# Patient Record
Sex: Female | Born: 1935 | Race: White | Hispanic: No | Smoking: Former smoker
Health system: Southern US, Community
[De-identification: ages and names within clinical notes are randomized; demographics above are authoritative.]

## PROBLEM LIST (undated history)

## (undated) DIAGNOSIS — J449 Chronic obstructive pulmonary disease, unspecified: Secondary | ICD-10-CM

## (undated) DIAGNOSIS — M8580 Other specified disorders of bone density and structure, unspecified site: Secondary | ICD-10-CM

## (undated) DIAGNOSIS — E079 Disorder of thyroid, unspecified: Secondary | ICD-10-CM

## (undated) DIAGNOSIS — K5790 Diverticulosis of intestine, part unspecified, without perforation or abscess without bleeding: Secondary | ICD-10-CM

## (undated) DIAGNOSIS — C50919 Malignant neoplasm of unspecified site of unspecified female breast: Secondary | ICD-10-CM

## (undated) DIAGNOSIS — D126 Benign neoplasm of colon, unspecified: Secondary | ICD-10-CM

## (undated) DIAGNOSIS — K648 Other hemorrhoids: Secondary | ICD-10-CM

## (undated) DIAGNOSIS — E785 Hyperlipidemia, unspecified: Secondary | ICD-10-CM

## (undated) HISTORY — DX: Disorder of thyroid, unspecified: E07.9

## (undated) HISTORY — DX: Diverticulosis of intestine, part unspecified, without perforation or abscess without bleeding: K57.90

## (undated) HISTORY — DX: Hyperlipidemia, unspecified: E78.5

## (undated) HISTORY — DX: Chronic obstructive pulmonary disease, unspecified: J44.9

## (undated) HISTORY — PX: BREAST SURGERY: SHX581

## (undated) HISTORY — PX: OTHER SURGICAL HISTORY: SHX169

## (undated) HISTORY — PX: BREAST LUMPECTOMY: SHX2

## (undated) HISTORY — DX: Other specified disorders of bone density and structure, unspecified site: M85.80

## (undated) HISTORY — DX: Other hemorrhoids: K64.8

## (undated) HISTORY — DX: Benign neoplasm of colon, unspecified: D12.6

---

## 2001-03-02 ENCOUNTER — Other Ambulatory Visit: Admission: RE | Admit: 2001-03-02 | Discharge: 2001-03-02 | Payer: Self-pay | Admitting: Family Medicine

## 2002-02-07 ENCOUNTER — Encounter: Payer: Self-pay | Admitting: Emergency Medicine

## 2002-02-07 ENCOUNTER — Emergency Department (HOSPITAL_COMMUNITY): Admission: EM | Admit: 2002-02-07 | Discharge: 2002-02-08 | Payer: Self-pay | Admitting: Emergency Medicine

## 2003-02-21 ENCOUNTER — Other Ambulatory Visit: Admission: RE | Admit: 2003-02-21 | Discharge: 2003-02-21 | Payer: Self-pay | Admitting: Family Medicine

## 2004-03-10 ENCOUNTER — Ambulatory Visit: Payer: Self-pay | Admitting: Internal Medicine

## 2004-03-19 ENCOUNTER — Ambulatory Visit: Payer: Self-pay | Admitting: Family Medicine

## 2004-03-25 ENCOUNTER — Ambulatory Visit: Payer: Self-pay | Admitting: Internal Medicine

## 2005-02-25 ENCOUNTER — Ambulatory Visit: Payer: Self-pay | Admitting: Family Medicine

## 2005-03-28 ENCOUNTER — Ambulatory Visit: Payer: Self-pay | Admitting: Family Medicine

## 2005-04-20 ENCOUNTER — Ambulatory Visit: Payer: Self-pay | Admitting: Family Medicine

## 2005-06-02 ENCOUNTER — Ambulatory Visit: Payer: Self-pay | Admitting: Family Medicine

## 2005-07-06 ENCOUNTER — Ambulatory Visit: Payer: Self-pay | Admitting: Family Medicine

## 2005-07-22 ENCOUNTER — Ambulatory Visit: Payer: Self-pay | Admitting: Family Medicine

## 2005-11-10 ENCOUNTER — Ambulatory Visit: Payer: Self-pay | Admitting: Family Medicine

## 2006-02-22 ENCOUNTER — Ambulatory Visit: Payer: Self-pay | Admitting: Family Medicine

## 2006-02-27 ENCOUNTER — Ambulatory Visit: Payer: Self-pay | Admitting: Family Medicine

## 2006-02-27 LAB — CONVERTED CEMR LAB
ALT: 16 units/L (ref 0–40)
AST: 19 units/L (ref 0–37)
BUN: 9 mg/dL (ref 6–23)
Calcium: 9.1 mg/dL (ref 8.4–10.5)
Chloride: 106 meq/L (ref 96–112)
Cholesterol: 144 mg/dL (ref 0–200)
GFR calc non Af Amer: 75 mL/min
Glomerular Filtration Rate, Af Am: 91 mL/min/{1.73_m2}
HDL: 35.2 mg/dL — ABNORMAL LOW (ref >39.0)
Potassium: 4 meq/L (ref 3.5–5.1)
Sodium: 144 meq/L (ref 135–145)

## 2006-05-15 ENCOUNTER — Encounter: Payer: Self-pay | Admitting: Family Medicine

## 2006-05-15 ENCOUNTER — Ambulatory Visit: Payer: Self-pay | Admitting: Family Medicine

## 2006-05-15 ENCOUNTER — Other Ambulatory Visit: Admission: RE | Admit: 2006-05-15 | Discharge: 2006-05-15 | Payer: Self-pay | Admitting: Family Medicine

## 2006-05-15 LAB — CONVERTED CEMR LAB
Albumin: 3.6 g/dL (ref 3.5–5.2)
BUN: 10 mg/dL (ref 6–23)
Basophils Absolute: 0 10*3/uL (ref 0.0–0.1)
CO2: 31 meq/L (ref 19–32)
Creatinine, Ser: 0.8 mg/dL (ref 0.4–1.2)
Glucose, Bld: 117 mg/dL — ABNORMAL HIGH (ref 70–99)
Hemoglobin: 15.4 g/dL — ABNORMAL HIGH (ref 12.0–15.0)
Lymphocytes Relative: 20.9 % (ref 12.0–46.0)
MCHC: 33.7 g/dL (ref 30.0–36.0)
Monocytes Relative: 6.4 % (ref 3.0–11.0)
Neutro Abs: 5 10*3/uL (ref 1.4–7.7)
Neutrophils Relative %: 71.1 % (ref 43.0–77.0)
RDW: 12.4 % (ref 11.5–14.6)
TSH: 1.54 microintl units/mL (ref 0.35–5.50)
Total Bilirubin: 0.7 mg/dL (ref 0.3–1.2)

## 2006-05-30 ENCOUNTER — Ambulatory Visit: Payer: Self-pay | Admitting: Internal Medicine

## 2006-06-30 ENCOUNTER — Encounter: Admission: RE | Admit: 2006-06-30 | Discharge: 2006-06-30 | Payer: Self-pay | Admitting: General Surgery

## 2006-07-31 ENCOUNTER — Encounter: Admission: RE | Admit: 2006-07-31 | Discharge: 2006-07-31 | Payer: Self-pay | Admitting: General Surgery

## 2006-08-02 ENCOUNTER — Ambulatory Visit (HOSPITAL_BASED_OUTPATIENT_CLINIC_OR_DEPARTMENT_OTHER): Admission: RE | Admit: 2006-08-02 | Discharge: 2006-08-02 | Payer: Self-pay | Admitting: General Surgery

## 2006-08-02 ENCOUNTER — Encounter: Admission: RE | Admit: 2006-08-02 | Discharge: 2006-08-02 | Payer: Self-pay | Admitting: General Surgery

## 2006-08-02 ENCOUNTER — Encounter (INDEPENDENT_AMBULATORY_CARE_PROVIDER_SITE_OTHER): Payer: Self-pay | Admitting: General Surgery

## 2006-08-02 ENCOUNTER — Encounter (INDEPENDENT_AMBULATORY_CARE_PROVIDER_SITE_OTHER): Payer: Self-pay | Admitting: *Deleted

## 2006-08-14 ENCOUNTER — Ambulatory Visit: Payer: Self-pay | Admitting: Oncology

## 2006-08-14 ENCOUNTER — Ambulatory Visit: Payer: Self-pay | Admitting: Family Medicine

## 2006-08-14 LAB — CONVERTED CEMR LAB
CO2: 32 meq/L (ref 19–32)
Creatinine, Ser: 0.8 mg/dL (ref 0.4–1.2)
Potassium: 3.8 meq/L (ref 3.5–5.1)
Sodium: 142 meq/L (ref 135–145)
Total Bilirubin: 0.6 mg/dL (ref 0.3–1.2)
Total Protein: 6.6 g/dL (ref 6.0–8.3)

## 2006-08-18 ENCOUNTER — Ambulatory Visit: Admission: RE | Admit: 2006-08-18 | Discharge: 2006-10-28 | Payer: Self-pay | Admitting: *Deleted

## 2006-08-29 LAB — CBC WITH DIFFERENTIAL/PLATELET
BASO%: 1.1 % (ref 0.0–2.0)
LYMPH%: 27.8 % (ref 14.0–48.0)
MCHC: 35.3 g/dL (ref 32.0–36.0)
MCV: 84.6 fL (ref 81.0–101.0)
MONO%: 8.4 % (ref 0.0–13.0)
Platelets: 243 10*3/uL (ref 145–400)
RBC: 4.81 10*6/uL (ref 3.70–5.32)
RDW: 13.1 % (ref 11.3–14.5)
WBC: 7.7 10*3/uL (ref 3.9–10.0)

## 2006-08-29 LAB — COMPREHENSIVE METABOLIC PANEL
ALT: 15 U/L (ref 0–35)
AST: 15 U/L (ref 0–37)
Alkaline Phosphatase: 72 U/L (ref 39–117)
Sodium: 143 mEq/L (ref 135–145)
Total Bilirubin: 0.3 mg/dL (ref 0.3–1.2)
Total Protein: 6.8 g/dL (ref 6.0–8.3)

## 2006-10-11 ENCOUNTER — Encounter: Payer: Self-pay | Admitting: Family Medicine

## 2006-10-19 ENCOUNTER — Encounter: Payer: Self-pay | Admitting: Family Medicine

## 2006-10-26 ENCOUNTER — Ambulatory Visit: Payer: Self-pay | Admitting: Oncology

## 2006-10-28 ENCOUNTER — Encounter: Payer: Self-pay | Admitting: Family Medicine

## 2006-12-01 ENCOUNTER — Ambulatory Visit: Payer: Self-pay | Admitting: Family Medicine

## 2006-12-04 ENCOUNTER — Encounter: Payer: Self-pay | Admitting: Family Medicine

## 2006-12-21 ENCOUNTER — Ambulatory Visit: Payer: Self-pay | Admitting: Oncology

## 2006-12-25 ENCOUNTER — Encounter: Payer: Self-pay | Admitting: Family Medicine

## 2006-12-25 LAB — CBC WITH DIFFERENTIAL/PLATELET
BASO%: 0.2 % (ref 0.0–2.0)
HCT: 37.5 % (ref 34.8–46.6)
LYMPH%: 17.8 % (ref 14.0–48.0)
MCH: 30.2 pg (ref 26.0–34.0)
MCHC: 35.6 g/dL (ref 32.0–36.0)
MCV: 84.9 fL (ref 81.0–101.0)
MONO#: 0.7 10*3/uL (ref 0.1–0.9)
NEUT%: 70.1 % (ref 39.6–76.8)
Platelets: 238 10*3/uL (ref 145–400)
WBC: 6.4 10*3/uL (ref 3.9–10.0)

## 2006-12-25 LAB — COMPREHENSIVE METABOLIC PANEL
ALT: 29 U/L (ref 0–35)
Albumin: 4.3 g/dL (ref 3.5–5.2)
BUN: 15 mg/dL (ref 6–23)
CO2: 25 mEq/L (ref 19–32)
Calcium: 9.9 mg/dL (ref 8.4–10.5)
Chloride: 105 mEq/L (ref 96–112)
Creatinine, Ser: 0.82 mg/dL (ref 0.40–1.20)
Potassium: 4.2 mEq/L (ref 3.5–5.3)

## 2006-12-25 LAB — LACTATE DEHYDROGENASE: LDH: 185 U/L (ref 94–250)

## 2007-01-09 ENCOUNTER — Ambulatory Visit: Payer: Self-pay | Admitting: Family Medicine

## 2007-01-09 ENCOUNTER — Telehealth (INDEPENDENT_AMBULATORY_CARE_PROVIDER_SITE_OTHER): Payer: Self-pay | Admitting: *Deleted

## 2007-01-09 DIAGNOSIS — Z87440 Personal history of urinary (tract) infections: Secondary | ICD-10-CM | POA: Insufficient documentation

## 2007-01-09 LAB — CONVERTED CEMR LAB
Bilirubin Urine: NEGATIVE
Ketones, urine, test strip: NEGATIVE
Protein, U semiquant: NEGATIVE
Urobilinogen, UA: NEGATIVE
pH: 6

## 2007-01-12 ENCOUNTER — Encounter (INDEPENDENT_AMBULATORY_CARE_PROVIDER_SITE_OTHER): Payer: Self-pay | Admitting: *Deleted

## 2007-01-31 ENCOUNTER — Encounter: Payer: Self-pay | Admitting: Family Medicine

## 2007-02-19 ENCOUNTER — Ambulatory Visit: Payer: Self-pay | Admitting: Oncology

## 2007-02-21 ENCOUNTER — Encounter: Payer: Self-pay | Admitting: Family Medicine

## 2007-02-21 LAB — COMPREHENSIVE METABOLIC PANEL
ALT: 16 U/L (ref 0–35)
AST: 17 U/L (ref 0–37)
Albumin: 4.1 g/dL (ref 3.5–5.2)
Alkaline Phosphatase: 52 U/L (ref 39–117)
BUN: 12 mg/dL (ref 6–23)
Calcium: 9.6 mg/dL (ref 8.4–10.5)
Chloride: 104 mEq/L (ref 96–112)
Potassium: 3.9 mEq/L (ref 3.5–5.3)
Sodium: 142 mEq/L (ref 135–145)
Total Protein: 6.7 g/dL (ref 6.0–8.3)

## 2007-02-21 LAB — CBC WITH DIFFERENTIAL/PLATELET
BASO%: 0.2 % (ref 0.0–2.0)
Basophils Absolute: 0 10*3/uL (ref 0.0–0.1)
EOS%: 1.6 % (ref 0.0–7.0)
HGB: 13.3 g/dL (ref 11.6–15.9)
MCH: 30.1 pg (ref 26.0–34.0)
MCV: 85.2 fL (ref 81.0–101.0)
MONO%: 8.6 % (ref 0.0–13.0)
NEUT#: 4.3 10*3/uL (ref 1.5–6.5)
RBC: 4.43 10*6/uL (ref 3.70–5.32)
RDW: 12.6 % (ref 11.3–14.5)
lymph#: 1.4 10*3/uL (ref 0.9–3.3)

## 2007-03-12 ENCOUNTER — Telehealth (INDEPENDENT_AMBULATORY_CARE_PROVIDER_SITE_OTHER): Payer: Self-pay | Admitting: *Deleted

## 2007-03-13 ENCOUNTER — Ambulatory Visit: Payer: Self-pay | Admitting: Family Medicine

## 2007-03-13 DIAGNOSIS — J069 Acute upper respiratory infection, unspecified: Secondary | ICD-10-CM | POA: Insufficient documentation

## 2007-03-16 ENCOUNTER — Ambulatory Visit: Payer: Self-pay | Admitting: Family Medicine

## 2007-03-19 ENCOUNTER — Encounter: Payer: Self-pay | Admitting: Family Medicine

## 2007-03-21 ENCOUNTER — Ambulatory Visit: Payer: Self-pay | Admitting: Family Medicine

## 2007-04-19 ENCOUNTER — Ambulatory Visit: Payer: Self-pay | Admitting: Oncology

## 2007-05-25 ENCOUNTER — Encounter: Payer: Self-pay | Admitting: Family Medicine

## 2007-05-25 LAB — CBC WITH DIFFERENTIAL/PLATELET
EOS%: 1.1 % (ref 0.0–7.0)
Eosinophils Absolute: 0.1 10*3/uL (ref 0.0–0.5)
LYMPH%: 20.3 % (ref 14.0–48.0)
MCH: 28.7 pg (ref 26.0–34.0)
MCHC: 33.7 g/dL (ref 32.0–36.0)
MCV: 85.1 fL (ref 81.0–101.0)
MONO%: 7.4 % (ref 0.0–13.0)
NEUT#: 4.2 10*3/uL (ref 1.5–6.5)
Platelets: 227 10*3/uL (ref 145–400)
RBC: 4.63 10*6/uL (ref 3.70–5.32)
RDW: 13.1 % (ref 11.3–14.5)

## 2007-05-25 LAB — COMPREHENSIVE METABOLIC PANEL
AST: 13 U/L (ref 0–37)
Alkaline Phosphatase: 56 U/L (ref 39–117)
BUN: 13 mg/dL (ref 6–23)
Glucose, Bld: 138 mg/dL — ABNORMAL HIGH (ref 70–99)
Sodium: 140 mEq/L (ref 135–145)
Total Bilirubin: 0.4 mg/dL (ref 0.3–1.2)
Total Protein: 6.8 g/dL (ref 6.0–8.3)

## 2007-06-04 ENCOUNTER — Encounter: Payer: Self-pay | Admitting: Family Medicine

## 2007-06-13 ENCOUNTER — Encounter: Admission: RE | Admit: 2007-06-13 | Discharge: 2007-06-13 | Payer: Self-pay | Admitting: Oncology

## 2007-07-10 ENCOUNTER — Encounter: Payer: Self-pay | Admitting: Family Medicine

## 2007-08-17 ENCOUNTER — Ambulatory Visit: Payer: Self-pay | Admitting: Oncology

## 2007-08-20 ENCOUNTER — Ambulatory Visit: Payer: Self-pay | Admitting: Oncology

## 2007-08-22 ENCOUNTER — Encounter: Payer: Self-pay | Admitting: Family Medicine

## 2007-08-22 ENCOUNTER — Ambulatory Visit (HOSPITAL_COMMUNITY): Admission: RE | Admit: 2007-08-22 | Discharge: 2007-08-22 | Payer: Self-pay | Admitting: Oncology

## 2007-08-22 LAB — COMPREHENSIVE METABOLIC PANEL
ALT: 13 U/L (ref 0–35)
Alkaline Phosphatase: 55 U/L (ref 39–117)
Creatinine, Ser: 0.9 mg/dL (ref 0.40–1.20)
Sodium: 141 mEq/L (ref 135–145)
Total Bilirubin: 0.2 mg/dL — ABNORMAL LOW (ref 0.3–1.2)
Total Protein: 6.5 g/dL (ref 6.0–8.3)

## 2007-08-22 LAB — CBC WITH DIFFERENTIAL/PLATELET
BASO%: 0.3 % (ref 0.0–2.0)
LYMPH%: 23.2 % (ref 14.0–48.0)
MCH: 29.5 pg (ref 26.0–34.0)
MCHC: 35.1 g/dL (ref 32.0–36.0)
MCV: 84 fL (ref 81.0–101.0)
MONO%: 7.4 % (ref 0.0–13.0)
NEUT%: 67.2 % (ref 39.6–76.8)
Platelets: 223 10*3/uL (ref 145–400)
RBC: 4.36 10*6/uL (ref 3.70–5.32)

## 2007-11-12 ENCOUNTER — Telehealth (INDEPENDENT_AMBULATORY_CARE_PROVIDER_SITE_OTHER): Payer: Self-pay | Admitting: *Deleted

## 2007-11-20 ENCOUNTER — Ambulatory Visit: Payer: Self-pay | Admitting: Oncology

## 2007-11-22 ENCOUNTER — Encounter: Payer: Self-pay | Admitting: Family Medicine

## 2007-11-22 LAB — CBC WITH DIFFERENTIAL/PLATELET
BASO%: 0.4 % (ref 0.0–2.0)
Eosinophils Absolute: 0.1 10*3/uL (ref 0.0–0.5)
MCV: 84.8 fL (ref 81.0–101.0)
MONO%: 8 % (ref 0.0–13.0)
NEUT#: 4.3 10*3/uL (ref 1.5–6.5)
RBC: 4.54 10*6/uL (ref 3.70–5.32)
RDW: 12.9 % (ref 11.3–14.5)
WBC: 6.2 10*3/uL (ref 3.9–10.0)

## 2007-11-22 LAB — COMPREHENSIVE METABOLIC PANEL
ALT: 17 U/L (ref 0–35)
AST: 14 U/L (ref 0–37)
Albumin: 4 g/dL (ref 3.5–5.2)
Alkaline Phosphatase: 52 U/L (ref 39–117)
Glucose, Bld: 125 mg/dL — ABNORMAL HIGH (ref 70–99)
Potassium: 4.2 mEq/L (ref 3.5–5.3)
Sodium: 139 mEq/L (ref 135–145)
Total Protein: 6.5 g/dL (ref 6.0–8.3)

## 2007-12-07 DIAGNOSIS — J449 Chronic obstructive pulmonary disease, unspecified: Secondary | ICD-10-CM | POA: Insufficient documentation

## 2007-12-07 DIAGNOSIS — E039 Hypothyroidism, unspecified: Secondary | ICD-10-CM | POA: Insufficient documentation

## 2007-12-07 DIAGNOSIS — M899 Disorder of bone, unspecified: Secondary | ICD-10-CM | POA: Insufficient documentation

## 2007-12-07 DIAGNOSIS — E785 Hyperlipidemia, unspecified: Secondary | ICD-10-CM | POA: Insufficient documentation

## 2007-12-07 DIAGNOSIS — M949 Disorder of cartilage, unspecified: Secondary | ICD-10-CM

## 2007-12-11 ENCOUNTER — Ambulatory Visit: Payer: Self-pay | Admitting: Family Medicine

## 2007-12-11 ENCOUNTER — Encounter: Payer: Self-pay | Admitting: Family Medicine

## 2007-12-11 ENCOUNTER — Encounter (INDEPENDENT_AMBULATORY_CARE_PROVIDER_SITE_OTHER): Payer: Self-pay | Admitting: *Deleted

## 2007-12-11 ENCOUNTER — Other Ambulatory Visit: Admission: RE | Admit: 2007-12-11 | Discharge: 2007-12-11 | Payer: Self-pay | Admitting: Family Medicine

## 2007-12-11 DIAGNOSIS — K921 Melena: Secondary | ICD-10-CM | POA: Insufficient documentation

## 2007-12-11 DIAGNOSIS — Z853 Personal history of malignant neoplasm of breast: Secondary | ICD-10-CM | POA: Insufficient documentation

## 2007-12-13 ENCOUNTER — Encounter (INDEPENDENT_AMBULATORY_CARE_PROVIDER_SITE_OTHER): Payer: Self-pay | Admitting: *Deleted

## 2007-12-13 ENCOUNTER — Telehealth (INDEPENDENT_AMBULATORY_CARE_PROVIDER_SITE_OTHER): Payer: Self-pay | Admitting: *Deleted

## 2007-12-17 ENCOUNTER — Encounter (INDEPENDENT_AMBULATORY_CARE_PROVIDER_SITE_OTHER): Payer: Self-pay | Admitting: *Deleted

## 2008-01-10 ENCOUNTER — Ambulatory Visit: Payer: Self-pay | Admitting: Family Medicine

## 2008-01-15 ENCOUNTER — Encounter (INDEPENDENT_AMBULATORY_CARE_PROVIDER_SITE_OTHER): Payer: Self-pay | Admitting: *Deleted

## 2008-01-16 ENCOUNTER — Ambulatory Visit: Payer: Self-pay | Admitting: Internal Medicine

## 2008-01-16 DIAGNOSIS — Z860100 Personal history of colon polyps, unspecified: Secondary | ICD-10-CM | POA: Insufficient documentation

## 2008-01-17 ENCOUNTER — Ambulatory Visit: Payer: Self-pay | Admitting: Internal Medicine

## 2008-01-17 ENCOUNTER — Encounter: Payer: Self-pay | Admitting: Internal Medicine

## 2008-01-22 ENCOUNTER — Encounter: Payer: Self-pay | Admitting: Internal Medicine

## 2008-02-04 ENCOUNTER — Ambulatory Visit: Payer: Self-pay | Admitting: Family Medicine

## 2008-03-17 ENCOUNTER — Ambulatory Visit: Payer: Self-pay | Admitting: Family Medicine

## 2008-04-04 ENCOUNTER — Encounter (INDEPENDENT_AMBULATORY_CARE_PROVIDER_SITE_OTHER): Payer: Self-pay | Admitting: *Deleted

## 2008-04-04 LAB — CONVERTED CEMR LAB
ALT: 14 units/L (ref 0–35)
AST: 20 units/L (ref 0–37)
Albumin: 3.5 g/dL (ref 3.5–5.2)
BUN: 16 mg/dL (ref 6–23)
CO2: 29 meq/L (ref 19–32)
Calcium: 8.8 mg/dL (ref 8.4–10.5)
Chloride: 107 meq/L (ref 96–112)
Cholesterol: 162 mg/dL (ref 0–200)
Creatinine, Ser: 0.9 mg/dL (ref 0.4–1.2)
GFR calc non Af Amer: 65 mL/min
HDL: 40.5 mg/dL (ref >39.0)
LDL Cholesterol: 93 mg/dL (ref 0–99)
Total Bilirubin: 0.5 mg/dL (ref 0.3–1.2)
Total CHOL/HDL Ratio: 4
Triglycerides: 142 mg/dL (ref 0–149)

## 2008-05-20 ENCOUNTER — Ambulatory Visit: Payer: Self-pay | Admitting: Oncology

## 2008-05-22 ENCOUNTER — Encounter: Payer: Self-pay | Admitting: Family Medicine

## 2008-05-22 LAB — COMPREHENSIVE METABOLIC PANEL
CO2: 28 mEq/L (ref 19–32)
Calcium: 9.8 mg/dL (ref 8.4–10.5)
Creatinine, Ser: 0.8 mg/dL (ref 0.40–1.20)
Glucose, Bld: 97 mg/dL (ref 70–99)
Total Bilirubin: 0.3 mg/dL (ref 0.3–1.2)

## 2008-05-22 LAB — CBC WITH DIFFERENTIAL/PLATELET
Basophils Absolute: 0 10*3/uL (ref 0.0–0.1)
Eosinophils Absolute: 0.1 10*3/uL (ref 0.0–0.5)
HCT: 39.7 % (ref 34.8–46.6)
HGB: 13.3 g/dL (ref 11.6–15.9)
LYMPH%: 23.6 % (ref 14.0–48.0)
MONO#: 0.6 10*3/uL (ref 0.1–0.9)
NEUT#: 4 10*3/uL (ref 1.5–6.5)
NEUT%: 65.3 % (ref 39.6–76.8)
Platelets: 254 10*3/uL (ref 145–400)
WBC: 6.1 10*3/uL (ref 3.9–10.0)

## 2008-06-05 ENCOUNTER — Encounter: Payer: Self-pay | Admitting: Family Medicine

## 2008-06-24 ENCOUNTER — Encounter: Payer: Self-pay | Admitting: Family Medicine

## 2008-07-15 ENCOUNTER — Ambulatory Visit: Payer: Self-pay | Admitting: Family Medicine

## 2008-08-02 LAB — CONVERTED CEMR LAB
ALT: 16 units/L (ref 0–35)
AST: 18 units/L (ref 0–37)
Albumin: 3.7 g/dL (ref 3.5–5.2)
Alkaline Phosphatase: 43 units/L (ref 39–117)
BUN: 15 mg/dL (ref 6–23)
CO2: 32 meq/L (ref 19–32)
Chloride: 104 meq/L (ref 96–112)
Cholesterol: 200 mg/dL (ref 0–200)
Creatinine, Ser: 0.9 mg/dL (ref 0.4–1.2)
Glucose, Bld: 105 mg/dL — ABNORMAL HIGH (ref 70–99)
Hgb A1c MFr Bld: 6.3 % — ABNORMAL HIGH (ref 4.6–6.0)
LDL Cholesterol: 121 mg/dL — ABNORMAL HIGH (ref 0–99)
Potassium: 5.2 meq/L — ABNORMAL HIGH (ref 3.5–5.1)
Total Protein: 6.5 g/dL (ref 6.0–8.3)
Triglycerides: 150 mg/dL — ABNORMAL HIGH (ref 0–149)

## 2008-08-04 ENCOUNTER — Encounter (INDEPENDENT_AMBULATORY_CARE_PROVIDER_SITE_OTHER): Payer: Self-pay | Admitting: *Deleted

## 2008-09-15 ENCOUNTER — Encounter (INDEPENDENT_AMBULATORY_CARE_PROVIDER_SITE_OTHER): Payer: Self-pay | Admitting: *Deleted

## 2008-09-15 ENCOUNTER — Ambulatory Visit: Payer: Self-pay | Admitting: Family Medicine

## 2008-09-15 DIAGNOSIS — B356 Tinea cruris: Secondary | ICD-10-CM | POA: Insufficient documentation

## 2008-09-15 DIAGNOSIS — N390 Urinary tract infection, site not specified: Secondary | ICD-10-CM | POA: Insufficient documentation

## 2008-09-15 LAB — CONVERTED CEMR LAB
Ketones, urine, test strip: NEGATIVE
Nitrite: NEGATIVE
Specific Gravity, Urine: 1.02
Urobilinogen, UA: NEGATIVE
WBC Urine, dipstick: NEGATIVE

## 2008-09-16 ENCOUNTER — Telehealth (INDEPENDENT_AMBULATORY_CARE_PROVIDER_SITE_OTHER): Payer: Self-pay | Admitting: *Deleted

## 2008-11-17 ENCOUNTER — Ambulatory Visit: Payer: Self-pay | Admitting: Family Medicine

## 2008-11-17 DIAGNOSIS — R7989 Other specified abnormal findings of blood chemistry: Secondary | ICD-10-CM | POA: Insufficient documentation

## 2008-11-17 DIAGNOSIS — M25579 Pain in unspecified ankle and joints of unspecified foot: Secondary | ICD-10-CM | POA: Insufficient documentation

## 2008-11-18 ENCOUNTER — Ambulatory Visit: Payer: Self-pay | Admitting: Oncology

## 2008-11-20 ENCOUNTER — Encounter: Payer: Self-pay | Admitting: Family Medicine

## 2008-11-20 LAB — COMPREHENSIVE METABOLIC PANEL
Alkaline Phosphatase: 40 U/L (ref 39–117)
Glucose, Bld: 96 mg/dL (ref 70–99)
Sodium: 142 mEq/L (ref 135–145)
Total Bilirubin: 0.2 mg/dL — ABNORMAL LOW (ref 0.3–1.2)
Total Protein: 6.5 g/dL (ref 6.0–8.3)

## 2008-11-20 LAB — CBC WITH DIFFERENTIAL/PLATELET
Eosinophils Absolute: 0.2 10*3/uL (ref 0.0–0.5)
LYMPH%: 28.3 % (ref 14.0–49.7)
MCHC: 34.3 g/dL (ref 31.5–36.0)
MCV: 85.7 fL (ref 79.5–101.0)
MONO%: 10.9 % (ref 0.0–14.0)
NEUT#: 3.6 10*3/uL (ref 1.5–6.5)
Platelets: 223 10*3/uL (ref 145–400)
RBC: 4.51 10*6/uL (ref 3.70–5.45)

## 2008-11-24 LAB — CONVERTED CEMR LAB
AST: 20 units/L (ref 0–37)
Albumin: 3.8 g/dL (ref 3.5–5.2)
Alkaline Phosphatase: 39 units/L (ref 39–117)
CO2: 31 meq/L (ref 19–32)
Chloride: 102 meq/L (ref 96–112)
Creatinine, Ser: 0.9 mg/dL (ref 0.4–1.2)
Glucose, Bld: 91 mg/dL (ref 70–99)
LDL Cholesterol: 110 mg/dL — ABNORMAL HIGH (ref 0–99)
Total Bilirubin: 0.7 mg/dL (ref 0.3–1.2)
Total CHOL/HDL Ratio: 4
Triglycerides: 144 mg/dL (ref 0.0–149.0)

## 2008-11-25 ENCOUNTER — Encounter (INDEPENDENT_AMBULATORY_CARE_PROVIDER_SITE_OTHER): Payer: Self-pay | Admitting: *Deleted

## 2009-02-25 ENCOUNTER — Ambulatory Visit: Payer: Self-pay | Admitting: Family Medicine

## 2009-02-25 ENCOUNTER — Encounter (INDEPENDENT_AMBULATORY_CARE_PROVIDER_SITE_OTHER): Payer: Self-pay | Admitting: *Deleted

## 2009-02-25 LAB — CONVERTED CEMR LAB
Bilirubin Urine: NEGATIVE
Blood in Urine, dipstick: NEGATIVE
Glucose, Urine, Semiquant: NEGATIVE
Protein, U semiquant: NEGATIVE
Urobilinogen, UA: NEGATIVE
WBC Urine, dipstick: NEGATIVE
pH: 6

## 2009-03-02 ENCOUNTER — Encounter: Payer: Self-pay | Admitting: Family Medicine

## 2009-03-02 LAB — CONVERTED CEMR LAB
AST: 22 units/L (ref 0–37)
Alkaline Phosphatase: 43 units/L (ref 39–117)
BUN: 16 mg/dL (ref 6–23)
Basophils Absolute: 0.1 10*3/uL (ref 0.0–0.1)
Bilirubin, Direct: 0 mg/dL (ref 0.0–0.3)
Calcium: 9.7 mg/dL (ref 8.4–10.5)
Cholesterol: 185 mg/dL (ref 0–200)
GFR calc non Af Amer: 57.74 mL/min (ref >60)
Glucose, Bld: 105 mg/dL — ABNORMAL HIGH (ref 70–99)
HDL: 43.3 mg/dL (ref >39.00)
LDL Cholesterol: 106 mg/dL — ABNORMAL HIGH (ref 0–99)
Lymphocytes Relative: 22.4 % (ref 12.0–46.0)
Lymphs Abs: 1.6 10*3/uL (ref 0.7–4.0)
Monocytes Relative: 10.3 % (ref 3.0–12.0)
Neutrophils Relative %: 64.8 % (ref 43.0–77.0)
Platelets: 230 10*3/uL (ref 150.0–400.0)
RDW: 12.7 % (ref 11.5–14.6)
TSH: 3.84 microintl units/mL (ref 0.35–5.50)
Total Bilirubin: 0.6 mg/dL (ref 0.3–1.2)
VLDL: 35.6 mg/dL (ref 0.0–40.0)

## 2009-03-10 ENCOUNTER — Ambulatory Visit: Payer: Self-pay | Admitting: Family Medicine

## 2009-03-10 LAB — CONVERTED CEMR LAB
OCCULT 2: NEGATIVE
OCCULT 3: NEGATIVE

## 2009-05-15 ENCOUNTER — Ambulatory Visit: Payer: Self-pay | Admitting: Oncology

## 2009-06-08 ENCOUNTER — Encounter: Payer: Self-pay | Admitting: Family Medicine

## 2009-06-11 ENCOUNTER — Ambulatory Visit: Payer: Self-pay | Admitting: Family Medicine

## 2009-06-11 DIAGNOSIS — R42 Dizziness and giddiness: Secondary | ICD-10-CM | POA: Insufficient documentation

## 2009-06-12 ENCOUNTER — Telehealth: Payer: Self-pay | Admitting: Family Medicine

## 2009-06-26 ENCOUNTER — Ambulatory Visit: Payer: Self-pay | Admitting: Oncology

## 2009-06-30 ENCOUNTER — Encounter: Payer: Self-pay | Admitting: Family Medicine

## 2009-06-30 LAB — CBC WITH DIFFERENTIAL/PLATELET
BASO%: 0.5 % (ref 0.0–2.0)
EOS%: 2.7 % (ref 0.0–7.0)
LYMPH%: 26.9 % (ref 14.0–49.7)
MCHC: 33.9 g/dL (ref 31.5–36.0)
MCV: 87.1 fL (ref 79.5–101.0)
MONO%: 8.9 % (ref 0.0–14.0)
NEUT#: 4 10*3/uL (ref 1.5–6.5)
Platelets: 239 10*3/uL (ref 145–400)
RBC: 4.48 10*6/uL (ref 3.70–5.45)
RDW: 13.6 % (ref 11.2–14.5)

## 2009-06-30 LAB — COMPREHENSIVE METABOLIC PANEL
ALT: 14 U/L (ref 0–35)
AST: 14 U/L (ref 0–37)
Albumin: 4.2 g/dL (ref 3.5–5.2)
Alkaline Phosphatase: 41 U/L (ref 39–117)
Potassium: 4.3 mEq/L (ref 3.5–5.3)
Sodium: 141 mEq/L (ref 135–145)
Total Bilirubin: 0.2 mg/dL — ABNORMAL LOW (ref 0.3–1.2)
Total Protein: 6.7 g/dL (ref 6.0–8.3)

## 2009-10-26 ENCOUNTER — Other Ambulatory Visit: Admission: RE | Admit: 2009-10-26 | Discharge: 2009-10-26 | Payer: Self-pay | Admitting: Family Medicine

## 2009-10-26 ENCOUNTER — Ambulatory Visit: Payer: Self-pay | Admitting: Family Medicine

## 2009-10-26 DIAGNOSIS — N841 Polyp of cervix uteri: Secondary | ICD-10-CM | POA: Insufficient documentation

## 2009-10-26 DIAGNOSIS — Z87448 Personal history of other diseases of urinary system: Secondary | ICD-10-CM | POA: Insufficient documentation

## 2009-10-26 LAB — CONVERTED CEMR LAB
Nitrite: NEGATIVE
Specific Gravity, Urine: 1.015
WBC Urine, dipstick: NEGATIVE

## 2009-10-29 ENCOUNTER — Encounter (INDEPENDENT_AMBULATORY_CARE_PROVIDER_SITE_OTHER): Payer: Self-pay | Admitting: *Deleted

## 2009-11-02 ENCOUNTER — Encounter: Payer: Self-pay | Admitting: Family Medicine

## 2009-12-02 ENCOUNTER — Encounter: Payer: Self-pay | Admitting: Family Medicine

## 2009-12-25 ENCOUNTER — Encounter: Payer: Self-pay | Admitting: Family Medicine

## 2009-12-31 ENCOUNTER — Ambulatory Visit: Payer: Self-pay | Admitting: Oncology

## 2010-01-04 ENCOUNTER — Encounter: Payer: Self-pay | Admitting: Family Medicine

## 2010-01-04 LAB — COMPREHENSIVE METABOLIC PANEL
AST: 21 U/L (ref 0–37)
Albumin: 3.7 g/dL (ref 3.5–5.2)
Alkaline Phosphatase: 49 U/L (ref 39–117)
BUN: 15 mg/dL (ref 6–23)
Calcium: 9.9 mg/dL (ref 8.4–10.5)
Chloride: 103 mEq/L (ref 96–112)
Glucose, Bld: 126 mg/dL — ABNORMAL HIGH (ref 70–99)
Potassium: 4.1 mEq/L (ref 3.5–5.3)
Sodium: 140 mEq/L (ref 135–145)
Total Protein: 6.4 g/dL (ref 6.0–8.3)

## 2010-01-04 LAB — CBC WITH DIFFERENTIAL/PLATELET
Basophils Absolute: 0 10*3/uL (ref 0.0–0.1)
EOS%: 1.8 % (ref 0.0–7.0)
Eosinophils Absolute: 0.1 10*3/uL (ref 0.0–0.5)
HGB: 13 g/dL (ref 11.6–15.9)
MONO%: 8.7 % (ref 0.0–14.0)
NEUT#: 3.8 10*3/uL (ref 1.5–6.5)
RBC: 4.48 10*6/uL (ref 3.70–5.45)
RDW: 13.5 % (ref 11.2–14.5)
lymph#: 1.6 10*3/uL (ref 0.9–3.3)

## 2010-01-07 ENCOUNTER — Ambulatory Visit: Payer: Self-pay | Admitting: Diagnostic Radiology

## 2010-01-07 ENCOUNTER — Ambulatory Visit (HOSPITAL_BASED_OUTPATIENT_CLINIC_OR_DEPARTMENT_OTHER): Admission: RE | Admit: 2010-01-07 | Discharge: 2010-01-07 | Payer: Self-pay | Admitting: Oncology

## 2010-01-22 ENCOUNTER — Ambulatory Visit (HOSPITAL_COMMUNITY)
Admission: RE | Admit: 2010-01-22 | Discharge: 2010-01-22 | Payer: Self-pay | Source: Home / Self Care | Admitting: Obstetrics and Gynecology

## 2010-01-22 ENCOUNTER — Encounter: Payer: Self-pay | Admitting: Family Medicine

## 2010-02-02 ENCOUNTER — Encounter: Payer: Self-pay | Admitting: Family Medicine

## 2010-03-11 ENCOUNTER — Ambulatory Visit: Payer: Self-pay | Admitting: Family Medicine

## 2010-04-20 ENCOUNTER — Telehealth (INDEPENDENT_AMBULATORY_CARE_PROVIDER_SITE_OTHER): Payer: Self-pay | Admitting: *Deleted

## 2010-04-22 ENCOUNTER — Encounter: Payer: Self-pay | Admitting: Family Medicine

## 2010-04-22 ENCOUNTER — Ambulatory Visit: Payer: Self-pay | Admitting: Family Medicine

## 2010-04-22 LAB — CONVERTED CEMR LAB
Bilirubin Urine: NEGATIVE
Ketones, urine, test strip: NEGATIVE
Protein, U semiquant: NEGATIVE
Urobilinogen, UA: NEGATIVE

## 2010-04-23 ENCOUNTER — Ambulatory Visit: Payer: Self-pay | Admitting: Family Medicine

## 2010-04-23 ENCOUNTER — Encounter: Payer: Self-pay | Admitting: Family Medicine

## 2010-04-23 DIAGNOSIS — Z780 Asymptomatic menopausal state: Secondary | ICD-10-CM | POA: Insufficient documentation

## 2010-04-23 LAB — CONVERTED CEMR LAB
ALT: 18 units/L (ref 0–35)
BUN: 18 mg/dL (ref 6–23)
Basophils Absolute: 0 10*3/uL (ref 0.0–0.1)
Bilirubin, Direct: 0.1 mg/dL (ref 0.0–0.3)
Chloride: 102 meq/L (ref 96–112)
Cholesterol: 188 mg/dL (ref 0–200)
Creatinine, Ser: 0.8 mg/dL (ref 0.4–1.2)
Eosinophils Absolute: 0.1 10*3/uL (ref 0.0–0.7)
Eosinophils Relative: 1.8 % (ref 0.0–5.0)
Glucose, Bld: 105 mg/dL — ABNORMAL HIGH (ref 70–99)
LDL Cholesterol: 113 mg/dL — ABNORMAL HIGH (ref 0–99)
MCV: 88 fL (ref 78.0–100.0)
Monocytes Absolute: 0.7 10*3/uL (ref 0.1–1.0)
Neutrophils Relative %: 64.7 % (ref 43.0–77.0)
Platelets: 229 10*3/uL (ref 150.0–400.0)
RDW: 14 % (ref 11.5–14.6)
TSH: 1.94 microintl units/mL (ref 0.35–5.50)
Total Bilirubin: 0.4 mg/dL (ref 0.3–1.2)
Triglycerides: 127 mg/dL (ref 0.0–149.0)
VLDL: 25.4 mg/dL (ref 0.0–40.0)
Vit D, 25-Hydroxy: 50 ng/mL (ref 30–89)
WBC: 6.8 10*3/uL (ref 4.5–10.5)

## 2010-04-27 ENCOUNTER — Telehealth (INDEPENDENT_AMBULATORY_CARE_PROVIDER_SITE_OTHER): Payer: Self-pay | Admitting: *Deleted

## 2010-05-27 ENCOUNTER — Ambulatory Visit (HOSPITAL_BASED_OUTPATIENT_CLINIC_OR_DEPARTMENT_OTHER)
Admission: RE | Admit: 2010-05-27 | Discharge: 2010-05-27 | Payer: Self-pay | Source: Home / Self Care | Attending: Family Medicine | Admitting: Family Medicine

## 2010-05-27 ENCOUNTER — Ambulatory Visit
Admission: RE | Admit: 2010-05-27 | Discharge: 2010-05-27 | Payer: Self-pay | Source: Home / Self Care | Attending: Family Medicine | Admitting: Family Medicine

## 2010-05-27 DIAGNOSIS — R300 Dysuria: Secondary | ICD-10-CM | POA: Insufficient documentation

## 2010-05-27 DIAGNOSIS — M25569 Pain in unspecified knee: Secondary | ICD-10-CM | POA: Insufficient documentation

## 2010-05-27 LAB — CONVERTED CEMR LAB
Bilirubin Urine: NEGATIVE
Glucose, Urine, Semiquant: NEGATIVE
Ketones, urine, test strip: NEGATIVE
Specific Gravity, Urine: 1.01
pH: 7

## 2010-05-28 ENCOUNTER — Encounter: Payer: Self-pay | Admitting: Family Medicine

## 2010-06-06 LAB — CONVERTED CEMR LAB
ALT: 19 units/L (ref 0–35)
ALT: 20 units/L (ref 0–35)
AST: 19 units/L (ref 0–37)
AST: 22 units/L (ref 0–37)
Albumin: 3.6 g/dL (ref 3.5–5.2)
Albumin: 3.8 g/dL (ref 3.5–5.2)
Alkaline Phosphatase: 40 units/L (ref 39–117)
Alkaline Phosphatase: 56 units/L (ref 39–117)
Alkaline Phosphatase: 67 units/L (ref 39–117)
BUN: 12 mg/dL (ref 6–23)
Basophils Absolute: 0 10*3/uL (ref 0.0–0.1)
Basophils Relative: 2.6 % (ref 0.0–3.0)
Bilirubin, Direct: 0.1 mg/dL (ref 0.0–0.3)
Bilirubin, Direct: 0.1 mg/dL (ref 0.0–0.3)
Blood in Urine, dipstick: NEGATIVE
CO2: 32 meq/L (ref 19–32)
Calcium: 9.5 mg/dL (ref 8.4–10.5)
Chloride: 100 meq/L (ref 96–112)
Creatinine, Ser: 0.8 mg/dL (ref 0.4–1.2)
Direct LDL: 92.3 mg/dL
Eosinophils Relative: 1.8 % (ref 0.0–5.0)
GFR calc non Af Amer: 65.15 mL/min (ref >60)
Glucose, Bld: 100 mg/dL — ABNORMAL HIGH (ref 70–99)
Glucose, Bld: 97 mg/dL (ref 70–99)
HDL: 40 mg/dL (ref >39.0)
HDL: 58.6 mg/dL (ref >39.00)
Hemoglobin: 13.5 g/dL (ref 12.0–15.0)
LDL Cholesterol: 102 mg/dL — ABNORMAL HIGH (ref 0–99)
Lymphocytes Relative: 20.6 % (ref 12.0–46.0)
Lymphocytes Relative: 27.4 % (ref 12.0–46.0)
MCV: 87.9 fL (ref 78.0–100.0)
Monocytes Relative: 8.3 % (ref 3.0–12.0)
Monocytes Relative: 8.6 % (ref 3.0–12.0)
Neutro Abs: 3.7 10*3/uL (ref 1.4–7.7)
Neutrophils Relative %: 66.7 % (ref 43.0–77.0)
Nitrite: NEGATIVE
Platelets: 228 10*3/uL (ref 150.0–400.0)
Potassium: 3.8 meq/L (ref 3.5–5.1)
RBC: 4.7 M/uL (ref 3.87–5.11)
RDW: 12.7 % (ref 11.5–14.6)
Sodium: 141 meq/L (ref 135–145)
Specific Gravity, Urine: <1.005
TSH: 3.92 microintl units/mL (ref 0.35–5.50)
Total Bilirubin: 0.5 mg/dL (ref 0.3–1.2)
Total CHOL/HDL Ratio: 3
Total CHOL/HDL Ratio: 4.7
Total Protein: 6.8 g/dL (ref 6.0–8.3)
Urobilinogen, UA: NEGATIVE
VLDL: 29.2 mg/dL (ref 0.0–40.0)
VLDL: 66 mg/dL — ABNORMAL HIGH (ref 0–40)
Vit D, 25-Hydroxy: 34 ng/mL (ref 30–89)
WBC Urine, dipstick: NEGATIVE
WBC: 7.9 10*3/uL (ref 4.5–10.5)

## 2010-06-09 ENCOUNTER — Encounter: Payer: Self-pay | Admitting: Family Medicine

## 2010-06-10 NOTE — Assessment & Plan Note (Signed)
Summary: bladder problems/kn   Vital Signs:  Patient profile:   77 year old female Weight:      180.38 pounds Pulse rate:   84 / minute Pulse rhythm:   regular BP sitting:   124 / 84  (left arm) Cuff size:   large  Vitals Entered By: Army Fossa CMA (October 26, 2009 12:48 PM) CC: Pt states over the past few weeks she has noticied blood in her urine.    History of Present Illness: Pt here c/o blood in underwear over the past few weeks.  No dysuria, no back pain ,no abd pain.    Current Medications (verified): 1)  Synthroid 112 Mcg Tabs (Levothyroxine Sodium) .... Take 1 Tablet By Mouth Once A Day 2)  Crestor 10 Mg Tabs (Rosuvastatin Calcium) .... Take One Tablet Every Evening At Bedtime. 3)  Caltrate 600+d 600-400 Mg-Unit  Tabs (Calcium Carbonate-Vitamin D) .... Daily 4)  Omega-3 350 Mg  Caps (Omega-3 Fatty Acids) .... Daily 5)  Fenofibrate 160 Mg Tabs (Fenofibrate) .Marland Kitchen.. 1 By Mouth Once Daily 6)  Vitamin D 78295 Unit  Caps (Ergocalciferol) .Marland Kitchen.. 1 Cap Weekly 7)  Tamoxifen Citrate 20 Mg Tabs (Tamoxifen Citrate) .Marland Kitchen.. 1 By Mouth Daily 8)  Zostavax 62130 Unt/0.60ml Solr (Zoster Vaccine Live) .Marland Kitchen.. 1 Ml Im X1 9)  Nascobal Nasal Spray 500 Mcg/0.23ml  (Cyanocobalamin) .Marland Kitchen.. 1 Spray 1 Nostril, Once A Week For 72month, Then Once A Month  Allergies (verified): No Known Drug Allergies  Past History:  Past medical, surgical, family and social histories (including risk factors) reviewed for relevance to current acute and chronic problems.  Past Medical History: Reviewed history from 12/11/2007 and no changes required. COPD Hyperlipidemia Hypothyroidism Osteopenia Breast cancer, hx of--Left  Past Surgical History: Reviewed history from 12/11/2007 and no changes required. 03/25/06-colonoscopy polyps Lumpectomy--L  Family History: Reviewed history from 12/07/2007 and no changes required. Father: died lung CA Mother: died, DM,Altheimer's Siblings: brother-healthy sister-allergies,  asthma sister-healthy  Social History: Reviewed history from 01/16/2008 and no changes required. Occupation:housewife Married Former Smoker Alcohol use-no Drug use-no Regular exercise-no Daily Caffeine Use  Review of Systems      See HPI  Physical Exam  General:  Well-developed,well-nourished,in no acute distress; alert,appropriate and cooperative throughout examination Abdomen:  Bowel sounds positive,abdomen soft and non-tender without masses, organomegaly or hernias noted. Rectal:  No external abnormalities noted. Normal sphincter tone. No rectal masses or   Heme neg brown stool Genitalia:  cervix--+ lg polyp---i did not remove secondary to size pap was done no d/c  no adenexal masses palpnormal introitus and no external lesions.   Extremities:  No clubbing, cyanosis, edema, or deformity noted with normal full range of motion of all joints.   Skin:  Intact without suspicious lesions or rashes Psych:  Oriented X3, normally interactive, and good eye contact.     Impression & Recommendations:  Problem # 1:  CERVICAL POLYP (ICD-622.7)  Orders: Gynecologic Referral (Gyn) UA Dipstick w/o Micro (manual) (86578)  Problem # 2:  HEMATURIA, HX OF (ICD-V13.09)  Orders: T-Culture, Urine ([id number]) Gynecologic Referral (Gyn) UA Dipstick w/o Micro (manual) (13244)  Complete Medication List: 1)  Synthroid 112 Mcg Tabs (Levothyroxine sodium) .... Take 1 tablet by mouth once a day 2)  Crestor 10 Mg Tabs (Rosuvastatin calcium) .... Take one tablet every evening at bedtime. 3)  Caltrate 600+d 600-400 Mg-unit Tabs (Calcium carbonate-vitamin d) .... Daily 4)  Omega-3 350 Mg Caps (Omega-3 fatty acids) .... Daily 5)  Fenofibrate 160 Mg Tabs (Fenofibrate) .Marland KitchenMarland KitchenMarland Kitchen  1 by mouth once daily 6)  Vitamin D 47829 Unit Caps (Ergocalciferol) .Marland Kitchen.. 1 cap weekly 7)  Tamoxifen Citrate 20 Mg Tabs (Tamoxifen citrate) .Marland Kitchen.. 1 by mouth daily 8)  Zostavax 56213 Unt/0.37ml Solr (Zoster vaccine live) .Marland Kitchen.. 1  ml im x1 9)  Nascobal Nasal Spray 500 Mcg/0.71ml (cyanocobalamin)  .Marland Kitchen.. 1 spray 1 nostril, once a week for 53month, then once a month  Laboratory Results   Urine Tests    Routine Urinalysis   Color: yellow Appearance: Clear Glucose: negative   (Normal Range: Negative) Bilirubin: negative   (Normal Range: Negative) Ketone: negative   (Normal Range: Negative) Spec. Gravity: 1.015   (Normal Range: 1.003-1.035) Blood: moderate   (Normal Range: Negative) pH: 6.0   (Normal Range: 5.0-8.0) Protein: negative   (Normal Range: Negative) Urobilinogen: 0.2   (Normal Range: 0-1) Nitrite: negative   (Normal Range: Negative) Leukocyte Esterace: negative   (Normal Range: Negative)    Comments: Army Fossa CMA  October 26, 2009 12:56 PM

## 2010-06-10 NOTE — Progress Notes (Signed)
Summary:  decline b12 injection  Phone Note Outgoing Call   Call placed by: Jeremy Johann CMA,  June 12, 2009 9:23 AM Summary of Call: pt decline b12 injections weekly for 1 month then 1 x a month due to pt husband having so many problem pt states that she would have a hard time get out to get injection.....................Marland KitchenFelecia Deloach CMA  June 12, 2009 9:24 AM   Follow-up for Phone Call        we can see if her insurance will cover nasocobal nasal spray ---with coupon Follow-up by: Loreen Freud DO,  June 12, 2009 9:27 AM  Additional Follow-up for Phone Call Additional follow up Details #1::        pt husband aware rx sent to pharmacy, coupon mail to pt...................Marland KitchenFelecia Deloach CMA  June 12, 2009 11:06 AM     New/Updated Medications: * NASCOBAL NASAL SPRAY 500 MCG/0.1ML  (CYANOCOBALAMIN) 1 spray 1 nostril, once a week for 64month, then once a month Prescriptions: NASCOBAL NASAL SPRAY 500 MCG/0.1ML  (CYANOCOBALAMIN) 1 spray 1 nostril, once a week for 64month, then once a month  #1 x 0   Entered by:   Jeremy Johann CMA   Authorized by:   Loreen Freud DO   Signed by:   Jeremy Johann CMA on 06/12/2009   Method used:   Faxed to ...       [facility] [address] (retail)       [address]       [place], [place]  [address]       Ph: [phone] or [phone]       Fax: [phone]   RxID:   [id number]

## 2010-06-10 NOTE — Progress Notes (Signed)
Summary: urine cx  Phone Note Outgoing Call   Call placed by: Doristine Devoid CMA,  April 27, 2010 3:43 PM Call placed to: Patient Summary of Call: + UTI---- cipro 500 1 by mouth two times a day for 5 days    Follow-up for Phone Call        tried calling patient phone busy will try again...Marland KitchenMarland KitchenDoristine Devoid CMA  April 27, 2010 3:45 PM   spoke w/ patient aware of urine cx results and prescription sent to pharmacy ........Marland KitchenDoristine Devoid CMA  April 27, 2010 4:02 PM     New/Updated Medications: CIPROFLOXACIN HCL 500 MG TABS (CIPROFLOXACIN HCL) take one tablet two times a day x5 days Prescriptions: CIPROFLOXACIN HCL 500 MG TABS (CIPROFLOXACIN HCL) take one tablet two times a day x5 days  #10 x 0   Entered by:   Doristine Devoid CMA   Authorized by:   Loreen Freud DO   Signed by:   Doristine Devoid CMA on 04/27/2010   Method used:   Electronically to        [facility] [address] (retail)       [address]       [place], [place]  [address]       Ph: [phone] or [phone]       Fax: [phone]   RxID:   [id number]

## 2010-06-10 NOTE — Miscellaneous (Signed)
Summary: [facility]  [facility]   Imported By: Lanelle Bal 11/16/2009 13:19:55  _____________________________________________________________________  External Attachment:    Type:   Image     Comment:   External Document  Appended Document: [facility]    Clinical Lists Changes  Observations: Added new observation of ZOSTAVAX: Zostavax (11/02/2009 16:54)       Immunization History:  Zostavax History:    Zostavax # 1:  Zostavax (11/02/2009)

## 2010-06-10 NOTE — Assessment & Plan Note (Signed)
Summary: cpx/cbs   Vital Signs:  Patient profile:   77 year old female Height:      65 inches Weight:      184 pounds BMI:     30.73 Temp:     98.4 degrees F oral Pulse rate:   72 / minute Resp:     20 per minute BP sitting:   140 / 78  (left arm)  Vitals Entered By: Jeremy Johann CMA (April 23, 2010 1:05 PM) CC: yearly  Does patient need assistance? Functional Status Self care, Cook/clean, Shopping, Social activities Ambulation Normal Comments Pt is able to do ADL---she does not drive Pt is able to read and write  Vision Screening:      Vision Comments: optho-- [facility]---  q1y 40db HL: Left  Right  Audiometry Comment: grossly normal    History of Present Illness: Pt here for cpe.  Pt with no complaints.    Preventive Screening-Counseling & Management  Alcohol-Tobacco     Alcohol drinks/day: 0     Smoking Status: quit     Year Quit: 2008     Pack years: 1/2ppd for52 years     Passive Smoke Exposure: no  Caffeine-Diet-Exercise     Caffeine use/day: 4     Does Patient Exercise: no     Exercise Counseling: to improve exercise regimen  Hep-HIV-STD-Contraception     HIV Risk: no     Dental Visit-last 6 months no     Dental Care Counseling: pt has dentures     SBE monthly: yes     SBE Education/Counseling: not indicated; SBE done regularly  Safety-Violence-Falls     Seat Belt Use: yes     Firearms in the Home: firearms in the home     Firearm Counseling: not indicated; uses recommended firearm safety measures     Smoke Detectors: yes     Smoke Detector Counseling: no     Violence in the Home: no risk noted     Fall Risk: n0      Sexual History:  currently monogamous.    Current Medications (verified): 1)  Synthroid 112 Mcg Tabs (Levothyroxine Sodium) .... Take 1 Tablet By Mouth Once A Day 2)  Crestor 10 Mg Tabs (Rosuvastatin Calcium) .... Take One Tablet Every Evening At Bedtime. 3)  Caltrate 600+d 600-400 Mg-Unit  Tabs (Calcium  Carbonate-Vitamin D) .... Daily 4)  Omega-3 350 Mg  Caps (Omega-3 Fatty Acids) .... Daily 5)  Fenofibrate 160 Mg Tabs (Fenofibrate) .Marland Kitchen.. 1 By Mouth Once Daily 6)  Vitamin D 1000 Unit Tabs (Cholecalciferol) .Marland Kitchen.. 1 By Mouth Once Daily  Allergies (verified): No Known Drug Allergies  Past History:  Past Medical History: Last updated: 12/11/2007 COPD Hyperlipidemia Hypothyroidism Osteopenia Breast cancer, hx of--Left  Family History: Last updated: 12/22/07 Father: died lung CA Mother: died, DM,Altheimer's Siblings: brother-healthy sister-allergies, asthma sister-healthy  Social History: Last updated: 01/16/2008 Occupation:housewife Married Former Smoker Alcohol use-no Drug use-no Regular exercise-no Daily Caffeine Use  Risk Factors: Alcohol Use: 0 (04/23/2010) Caffeine Use: 4 (04/23/2010) Exercise: no (04/23/2010)  Risk Factors: Smoking Status: quit (04/23/2010) Passive Smoke Exposure: no (04/23/2010)  Past Surgical History: 03/25/06-colonoscopy polyps Lumpectomy--L cervical polypectomy 01/22/2010--tomblin  Family History: Reviewed history from 12/22/07 and no changes required. Father: died lung CA Mother: died, DM,Altheimer's Siblings: brother-healthy sister-allergies, asthma sister-healthy  Social History: Reviewed history from 01/16/2008 and no changes required. Occupation:housewife Married Former Smoker Alcohol use-no Drug use-no Regular exercise-no Daily Caffeine Use Fall Risk:  n0 Sexual History:  currently monogamous  Review of Systems      See HPI General:  Denies chills, fatigue, fever, loss of appetite, malaise, sleep disorder, sweats, weakness, and weight loss. Eyes:  Denies blurring, discharge, double vision, eye irritation, eye pain, halos, itching, light sensitivity, red eye, vision loss-1 eye, and vision loss-both eyes. ENT:  Denies decreased hearing, difficulty swallowing, ear discharge, earache, hoarseness, nasal congestion,  nosebleeds, postnasal drainage, ringing in ears, sinus pressure, and sore throat. CV:  Denies bluish discoloration of lips or nails, chest pain or discomfort, difficulty breathing at night, difficulty breathing while lying down, fainting, fatigue, leg cramps with exertion, lightheadness, near fainting, palpitations, shortness of breath with exertion, swelling of feet, swelling of hands, and weight gain. Resp:  Denies chest discomfort, chest pain with inspiration, cough, coughing up blood, excessive snoring, hypersomnolence, morning headaches, pleuritic, shortness of breath, sputum productive, and wheezing. GI:  Denies abdominal pain, bloody stools, change in bowel habits, constipation, dark tarry stools, diarrhea, excessive appetite, gas, hemorrhoids, indigestion, loss of appetite, nausea, vomiting, vomiting blood, and yellowish skin color. GU:  Denies abnormal vaginal bleeding, decreased libido, discharge, dysuria, genital sores, hematuria, incontinence, nocturia, urinary frequency, and urinary hesitancy; vaginal itch. MS:  Denies joint pain, joint redness, joint swelling, loss of strength, low back pain, mid back pain, muscle aches, muscle , cramps, muscle weakness, stiffness, and thoracic pain. Derm:  Denies changes in color of skin, changes in nail beds, dryness, excessive perspiration, flushing, hair loss, insect bite(s), itching, lesion(s), poor wound healing, and rash. Neuro:  Denies brief paralysis, difficulty with concentration, disturbances in coordination, falling down, headaches, inability to speak, memory loss, numbness, poor balance, seizures, sensation of room spinning, tingling, tremors, visual disturbances, and weakness. Psych:  Denies alternate hallucination ( auditory/visual), anxiety, depression, easily angered, easily tearful, irritability, mental problems, panic attacks, sense of great danger, suicidal thoughts/plans, thoughts of violence, unusual visions or sounds, and thoughts /plans  of harming others. Endo:  Denies cold intolerance, excessive hunger, excessive thirst, excessive urination, heat intolerance, polyuria, and weight change. Heme:  Denies abnormal bruising, bleeding, enlarge lymph nodes, fevers, pallor, and skin discoloration. Allergy:  Denies hives or rash, itching eyes, persistent infections, seasonal allergies, and sneezing.  Physical Exam  General:  Well-developed,well-nourished,in no acute distress; alert,appropriate and cooperative throughout examination Head:  Normocephalic and atraumatic without obvious abnormalities. No apparent alopecia or balding. Eyes:  pupils equal, pupils round, pupils reactive to light, and no injection.   Ears:  External ear exam shows no significant lesions or deformities.  Otoscopic examination reveals clear canals, tympanic membranes are intact bilaterally without bulging, retraction, inflammation or discharge. Hearing is grossly normal bilaterally. Nose:  External nasal examination shows no deformity or inflammation. Nasal mucosa are pink and moist without lesions or exudates. Mouth:  Oral mucosa and oropharynx without lesions or exudates.  Teeth in good repair. Neck:  No deformities, masses, or tenderness noted. Breasts:  gyn Lungs:  Normal respiratory effort, chest expands symmetrically. Lungs are clear to auscultation, no crackles or wheezes. Heart:  normal rate and no murmur.   Abdomen:  Bowel sounds positive,abdomen soft and non-tender without masses, organomegaly or hernias noted. Rectal:  gyn Genitalia:  gyn Msk:  normal ROM, no joint tenderness, no joint swelling, no joint warmth, no redness over joints, no joint deformities, no joint instability, and no crepitation.   Pulses:  R and L carotid,radial,femoral,dorsalis pedis and posterior tibial pulses are full and equal bilaterally Extremities:  No clubbing, cyanosis, edema, or deformity noted with normal  full range of motion of all joints.   Neurologic:  No cranial  nerve deficits noted. Station and gait are normal. Plantar reflexes are down-going bilaterally. DTRs are symmetrical throughout. Sensory, motor and coordinative functions appear intact. Skin:  Intact without suspicious lesions or rashes Cervical Nodes:  No lymphadenopathy noted Psych:  Oriented X3, memory intact for recent and remote, normally interactive, good eye contact, not anxious appearing, not depressed appearing, and not suicidal.     Impression & Recommendations:  Problem # 1:  PREVENTIVE HEALTH CARE (ICD-V70.0)  Orders: Medicare -1st Annual Wellness Visit [phone]) EKG w/ Interpretation ([address])  Problem # 2:  POSTMENOPAUSAL STATUS (ICD-V49.81)  Orders: Radiology Referral (Radiology)  Problem # 3:  OSTEOPENIA (ICD-733.90)  Her updated medication list for this problem includes:    Caltrate 600+d 600-400 Mg-unit Tabs (Calcium carbonate-vitamin d) .Marland Kitchen... Daily    Vitamin D 1000 Unit Tabs (Cholecalciferol) .Marland Kitchen... 1 by mouth once daily  Vit D:34 (06/11/2009), 31 (02/25/2009)  Problem # 4:  HYPOTHYROIDISM (ICD-244.9)  Her updated medication list for this problem includes:    Synthroid 112 Mcg Tabs (Levothyroxine sodium) .Marland Kitchen... Take 1 tablet by mouth once a day  Labs Reviewed: TSH: 3.92 (06/11/2009)    HgBA1c: 6.3 (11/17/2008) Chol: 190 (06/11/2009)   HDL: 58.60 (06/11/2009)   LDL: 102 (06/11/2009)   TG: 146.0 (06/11/2009)  Problem # 5:  HYPERLIPIDEMIA (ICD-272.4)  Her updated medication list for this problem includes:    Crestor 10 Mg Tabs (Rosuvastatin calcium) .Marland Kitchen... Take one tablet every evening at bedtime.    Fenofibrate 160 Mg Tabs (Fenofibrate) .Marland Kitchen... 1 by mouth once daily  Labs Reviewed: SGOT: 19 (06/11/2009)   SGPT: 17 (06/11/2009)   HDL:58.60 (06/11/2009), 43.30 (02/25/2009)  LDL:102 (06/11/2009), 106 (02/25/2009)  Chol:190 (06/11/2009), 185 (02/25/2009)  Trig:146.0 (06/11/2009), 178.0 (02/25/2009)  Complete Medication List: 1)  Synthroid 112 Mcg Tabs  (Levothyroxine sodium) .... Take 1 tablet by mouth once a day 2)  Crestor 10 Mg Tabs (Rosuvastatin calcium) .... Take one tablet every evening at bedtime. 3)  Caltrate 600+d 600-400 Mg-unit Tabs (Calcium carbonate-vitamin d) .... Daily 4)  Omega-3 350 Mg Caps (Omega-3 fatty acids) .... Daily 5)  Fenofibrate 160 Mg Tabs (Fenofibrate) .Marland Kitchen.. 1 by mouth once daily 6)  Vitamin D 1000 Unit Tabs (Cholecalciferol) .Marland Kitchen.. 1 by mouth once daily  Patient Instructions: 1)  fasting labs  6 months---lipid, hep, bmp 272.4 Prescriptions: FENOFIBRATE 160 MG TABS (FENOFIBRATE) 1 by mouth once daily  #90 x 3   Entered and Authorized by:   Loreen Freud DO   Signed by:   Loreen Freud DO on 04/23/2010   Method used:   Electronically to        [facility] [address] (retail)       [address]       [place], [place]  [address]       Ph: [phone] or [phone]       Fax: [phone]   RxID:   [id number] CRESTOR 10 MG TABS (ROSUVASTATIN CALCIUM) Take one tablet every evening at bedtime.  #90 x 3   Entered and Authorized by:   Loreen Freud DO   Signed by:   Loreen Freud DO on 04/23/2010   Method used:   Electronically to        [facility] [address] (retail)       [address]  [address]       [place], [place]  [address]       Ph: [phone] or [phone]       Fax: [phone]   RxID:   [id number] SYNTHROID 112 MCG TABS (LEVOTHYROXINE SODIUM) Take 1 tablet by mouth once a day  #90 Tablet x 3   Entered and Authorized by:   Loreen Freud DO   Signed by:   Loreen Freud DO on 04/23/2010   Method used:   Electronically to        [facility] [address] (retail)       [address]       [place], [place]  [address]       Ph: [phone] or [phone]       Fax: [phone]   RxID:   [id number]    Orders Added: 1)  Radiology Referral [Radiology] 2)  Medicare -1st Annual Wellness Visit [G0438] 3)  EKG w/  Interpretation [93000] 4)  Est. Patient Level III [[address]]    Last Flu Vaccine:  Fluvax 3+ (03/11/2010 2:01:08 PM) Flu Vaccine Result Date:  03/11/2010 Flu Vaccine Result:  given Flu Vaccine Next Due:  1 yr Mammogram Result Date:  05/21/2008 Mammogram Result:  normal Mammogram Next Due:  1 yr   Appended Document: Orders Update    Clinical Lists Changes  Observations: Added new observation of PH URINE: 6.5  (04/23/2010 13:56) Added new observation of SPEC GR URIN: 1.010  (04/23/2010 13:56) Added new observation of APPEARANCE U: Cloudy  (04/23/2010 13:56) Added new observation of UA COLOR: lt. yellow  (04/23/2010 13:56) Added new observation of WBC DIPSTK U: moderate  (04/23/2010 13:56) Added new observation of NITRITE URN: negative  (04/23/2010 13:56) Added new observation of UROBILINOGEN: 0.2  (04/23/2010 13:56) Added new observation of PROTEIN, URN: trace  (04/23/2010 13:56) Added new observation of BLOOD UR DIP: negative  (04/23/2010 13:56) Added new observation of KETONES URN: negative  (04/23/2010 13:56) Added new observation of BILIRUBIN UR: negative  (04/23/2010 13:56) Added new observation of GLUCOSE, URN: negative  (04/23/2010 13:56)      Laboratory Results   Urine Tests    Routine Urinalysis   Color: lt. yellow Appearance: Cloudy Glucose: negative   (Normal Range: Negative) Bilirubin: negative   (Normal Range: Negative) Ketone: negative   (Normal Range: Negative) Spec. Gravity: 1.010   (Normal Range: 1.003-1.035) Blood: negative   (Normal Range: Negative) pH: 6.5   (Normal Range: 5.0-8.0) Protein: trace   (Normal Range: Negative) Urobilinogen: 0.2   (Normal Range: 0-1) Nitrite: negative   (Normal Range: Negative) Leukocyte Esterace: moderate   (Normal Range: Negative)       Appended Document: Orders Update    Clinical Lists Changes  Orders: Added new Test order of T-Culture, Urine ([id number]) - Signed

## 2010-06-10 NOTE — Letter (Signed)
Summary: [facility]  S[facility] Clearance/[facility]   Imported By: Lanelle Bal 01/12/2010 10:45:24  _____________________________________________________________________  External Attachment:    Type:   Image     Comment:   External Document

## 2010-06-10 NOTE — Letter (Signed)
Summary: [facility]   Imported By: Lanelle Bal 02/18/2010 08:04:49  _____________________________________________________________________  External Attachment:    Type:   Image     Comment:   External Document

## 2010-06-10 NOTE — Letter (Signed)
Summary: [facility]  [facility]   Imported By: Lanelle Bal 01/27/2010 12:38:01  _____________________________________________________________________  External Attachment:    Type:   Image     Comment:   External Document

## 2010-06-10 NOTE — Assessment & Plan Note (Signed)
Summary: flu shot/cbs  Nurse Visit  CC: Flu shot./kb   Allergies: No Known Drug Allergies  Orders Added: 1)  Flu Vaccine 49yrs + MEDICARE PATIENTS [Q2039] 2)  Administration Flu vaccine - MCR [G0008]         Flu Vaccine Consent Questions     Do you have a history of severe allergic reactions to this vaccine? no    Any prior history of allergic reactions to egg and/or gelatin? no    Do you have a sensitivity to the preservative Thimersol? no    Do you have a past history of Guillan-Barre Syndrome? no    Do you currently have an acute febrile illness? no    Have you ever had a severe reaction to latex? no    Vaccine information given and explained to patient? yes    Are you currently pregnant? no    Lot Number:[id number]   Exp Date:11/06/2010   Site Given  Left Deltoid IM

## 2010-06-10 NOTE — Consult Note (Signed)
Summary: [facility]   Imported By: Lanelle Bal 12/24/2009 09:56:35  _____________________________________________________________________  External Attachment:    Type:   Image     Comment:   External Document

## 2010-06-10 NOTE — Assessment & Plan Note (Signed)
Summary: UTI coming back??//lch   Vital Signs:  Patient profile:   77 year old female Weight:      185 pounds Temp:     98.3 degrees F oral BP sitting:   120 / 76  (right arm) Cuff size:   large  Vitals Entered By: Almeta Monas CMA Duncan Dull) (May 27, 2010 1:50 PM) CC: c/o UTI coming back, Dysuria   History of Present Illness:  Dysuria      This is a 77 year old woman who presents with Dysuria.  The patient complains of burning with urination and urinary frequency, but denies urgency, hematuria, vaginal discharge, vaginal itching, and vaginal sores.  The patient denies the following associated symptoms: nausea, vomiting, fever, shaking chills, flank pain, abdominal pain, back pain, pelvic pain, and arthralgias.  The patient denies the following risk factors: diabetes, prior antibiotics, immunosuppression, history of GU anomaly, history of pyelonephritis, pregnancy, history of STD, and analgesic abuse.  History is significant for recent UTI.    Pt also c/o L knee pain =--- no known injury but pt did say she cleans the shower on her knees.  symptoms for several months.  Current Medications (verified): 1)  Synthroid 112 Mcg Tabs (Levothyroxine Sodium) .... Take 1 Tablet By Mouth Once A Day 2)  Crestor 10 Mg Tabs (Rosuvastatin Calcium) .... Take One Tablet Every Evening At Bedtime. 3)  Caltrate 600+d 600-400 Mg-Unit  Tabs (Calcium Carbonate-Vitamin D) .... Daily 4)  Omega-3 350 Mg  Caps (Omega-3 Fatty Acids) .... Daily 5)  Fenofibrate 160 Mg Tabs (Fenofibrate) .Marland Kitchen.. 1 By Mouth Once Daily 6)  Vitamin D 1000 Unit Tabs (Cholecalciferol) .Marland Kitchen.. 1 By Mouth Once Daily  Allergies (verified): No Known Drug Allergies  Past History:  Past Medical History: Last updated: 12/11/2007 COPD Hyperlipidemia Hypothyroidism Osteopenia Breast cancer, hx of--Left  Past Surgical History: Last updated: 04/23/2010 03/25/06-colonoscopy polyps Lumpectomy--L cervical polypectomy  01/22/2010--tomblin  Family History: Last updated: 2007-12-12 Father: died lung CA Mother: died, DM,Altheimer's Siblings: brother-healthy sister-allergies, asthma sister-healthy  Social History: Last updated: 01/16/2008 Occupation:housewife Married Former Smoker Alcohol use-no Drug use-no Regular exercise-no Daily Caffeine Use  Risk Factors: Alcohol Use: 0 (04/23/2010) Caffeine Use: 4 (04/23/2010) Exercise: no (04/23/2010)  Risk Factors: Smoking Status: quit (04/23/2010) Passive Smoke Exposure: no (04/23/2010)  Family History: Reviewed history from 2007-12-12 and no changes required. Father: died lung CA Mother: died, DM,Altheimer's Siblings: brother-healthy sister-allergies, asthma sister-healthy  Social History: Reviewed history from 01/16/2008 and no changes required. Occupation:housewife Married Former Smoker Alcohol use-no Drug use-no Regular exercise-no Daily Caffeine Use  Review of Systems      See HPI  Physical Exam  General:  Well-developed,well-nourished,in no acute distress; alert,appropriate and cooperative throughout examination Msk:  L knee--- no joint warmth, no redness over joints, no joint deformities, no joint instability, joint tenderness, and joint swelling.   Psych:  Oriented X3 and normally interactive.     Impression & Recommendations:  Problem # 1:  KNEE PAIN, LEFT (ICD-719.46)  Her updated medication list for this problem includes:    Mobic 15 Mg Tabs (Meloxicam) .Marland Kitchen... 1/2 -1 by mouth once daily as needed  Orders: T-Knee Left 2 view (73560TC) Knee Orthosis Elastic Knee Cap (Z6109) Prescription Created Electronically [phone])  Discussed strengthening exercises, use of ice or heat, and medications.   Problem # 2:  DYSURIA (ICD-788.1)  Her updated medication list for this problem includes:    Macrobid 100 Mg Caps (Nitrofurantoin monohyd macro) .Marland Kitchen... 1 by mouth two times a day  Encouraged to push clear liquids, get enough  rest, and take acetaminophen as needed. To be seen in 10 days if no improvement, sooner if worse.  Orders: Knee Orthosis Elastic Knee Cap (E4540)  Complete Medication List: 1)  Synthroid 112 Mcg Tabs (Levothyroxine sodium) .... Take 1 tablet by mouth once a day 2)  Crestor 10 Mg Tabs (Rosuvastatin calcium) .... Take one tablet every evening at bedtime. 3)  Caltrate 600+d 600-400 Mg-unit Tabs (Calcium carbonate-vitamin d) .... Daily 4)  Omega-3 350 Mg Caps (Omega-3 fatty acids) .... Daily 5)  Fenofibrate 160 Mg Tabs (Fenofibrate) .Marland Kitchen.. 1 by mouth once daily 6)  Vitamin D 1000 Unit Tabs (Cholecalciferol) .Marland Kitchen.. 1 by mouth once daily 7)  Macrobid 100 Mg Caps (Nitrofurantoin monohyd macro) .Marland Kitchen.. 1 by mouth two times a day 8)  Mobic 15 Mg Tabs (Meloxicam) .... 1/2 -1 by mouth once daily as needed  Other Orders: T-Culture, Urine ([id number]) Prescriptions: MOBIC 15 MG TABS (MELOXICAM) 1/2 -1 by mouth once daily as needed  #30 x 0   Entered and Authorized by:   Loreen Freud DO   Signed by:   Loreen Freud DO on 05/27/2010   Method used:   Electronically to        [facility] (retail)       [address]       [place], [place]  [address]       Ph: [phone] or [phone]       Fax: [phone]   RxID:   [id number] MACROBID 100 MG CAPS (NITROFURANTOIN MONOHYD MACRO) 1 by mouth two times a day  #14 x 0   Entered and Authorized by:   Loreen Freud DO   Signed by:   Loreen Freud DO on 05/27/2010   Method used:   Electronically to        [facility] (retail)       [address]       [place], [place]  [address]       Ph: [phone] or [phone]       Fax: [phone]   RxID:   [id number]    Orders Added: 1)  T-Culture, Urine [[id number]] 2)  T-Knee Left 2 view [73560TC] 3)  Est. Patient Level III [10626] 4)  Knee Orthosis Elastic Knee Cap [L1825] 5)  Prescription Created Electronically  [id number]    Laboratory Results   Urine Tests    Routine Urinalysis   Color: yellow Appearance: Hazy Glucose: negative   (Normal Range: Negative) Bilirubin: negative   (Normal Range: Negative) Ketone: negative   (Normal Range: Negative) Spec. Gravity: 1.010   (Normal Range: 1.003-1.035) Blood: trace-lysed   (Normal Range: Negative) pH: 7.0   (Normal Range: 5.0-8.0) Protein: negative   (Normal Range: Negative) Urobilinogen: 0.2   (Normal Range: 0-1) Nitrite: negative   (Normal Range: Negative) Leukocyte Esterace: small   (Normal Range: Negative)

## 2010-06-10 NOTE — Progress Notes (Signed)
Summary: can she get labs  on Thursday 12/15 (before CPX)??--entered labs  Phone Note Call from Patient Call back at Home Phone [phone]   Caller: Patient Summary of Call: Patient has appointment to see Dr Laury Axon for Friday 12/16 at 1:00 for CPX---can she come in thursday morning 12/15 to get lab work done??  (spouse also has appt on thursday 12/15 at 1:00)  if so, what orders and codes? Initial call taken by: Jerolyn Shin,  April 20, 2010 3:28 PM  Follow-up for Phone Call        lipid,bmp,cbc,hep,liver,vit-d, UA, TSH   733.0,244.9,272.4,v70. pls advise on additional orders and codes.......Marland KitchenFelecia Deloach CMA  April 20, 2010 3:50 PM   Additional Follow-up for Phone Call Additional follow up Details #1::        that is perfect    Additional Follow-up for Phone Call Additional follow up Details #2::    set up lab appt for Thursday 04/22/2010 at 9:20am--called spouse and gave him appt time Follow-up by: Jerolyn Shin,  April 21, 2010 9:19 AM

## 2010-06-10 NOTE — Letter (Signed)
Summary: [facility]   Imported By: Lanelle Bal 02/08/2010 11:45:53  _____________________________________________________________________  External Attachment:    Type:   Image     Comment:   External Document

## 2010-06-10 NOTE — Assessment & Plan Note (Signed)
Summary: dizzy spells, fasting labs - jr   Vital Signs:  Patient profile:   77 year old female Weight:      178 pounds O2 Sat:      96 % on Room air Temp:     98.0 degrees F oral Pulse rate:   80 / minute Pulse rhythm:   regular BP sitting:   124 / 80  (left arm) Cuff size:   large  Vitals Entered By: Army Fossa CMA (June 11, 2009 11:03 AM)  O2 Flow:  Room air CC: Pt states she has been dizzy x 1 week. when she bends over mostly   History of Present Illness: Pt here c/o dizziness with change in position--pt recently had cataract surgery on R eye and when she had a friend but in eye drops the room started spinning.  Now it occurs whenever she gets out of bed, up from sitting or if she bends over.   No congestion or sinus pressure.    Current Medications (verified): 1)  Synthroid 112 Mcg Tabs (Levothyroxine Sodium) .... Take 1 Tablet By Mouth Once A Day 2)  Crestor 10 Mg Tabs (Rosuvastatin Calcium) .... Take One Tablet Every Evening At Bedtime. 3)  Caltrate 600+d 600-400 Mg-Unit  Tabs (Calcium Carbonate-Vitamin D) .... Daily 4)  Omega-3 350 Mg  Caps (Omega-3 Fatty Acids) .... Daily 5)  Fenofibrate 160 Mg Tabs (Fenofibrate) .Marland Kitchen.. 1 By Mouth Once Daily 6)  Vitamin D 16109 Unit  Caps (Ergocalciferol) .Marland Kitchen.. 1 Cap Weekly 7)  Tamoxifen Citrate 20 Mg Tabs (Tamoxifen Citrate) .Marland Kitchen.. 1 By Mouth Daily 8)  Zostavax 60454 Unt/0.82ml Solr (Zoster Vaccine Live) .Marland Kitchen.. 1 Ml Im X1  Allergies (verified): No Known Drug Allergies  Past History:  Past medical, surgical, family and social histories (including risk factors) reviewed for relevance to current acute and chronic problems.  Past Medical History: Reviewed history from 12/11/2007 and no changes required. COPD Hyperlipidemia Hypothyroidism Osteopenia Breast cancer, hx of--Left  Past Surgical History: Reviewed history from 12/11/2007 and no changes required. 03/25/06-colonoscopy polyps Lumpectomy--L  Family History: Reviewed  history from 12/07/2007 and no changes required. Father: died lung CA Mother: died, DM,Altheimer's Siblings: brother-healthy sister-allergies, asthma sister-healthy  Social History: Reviewed history from 01/16/2008 and no changes required. Occupation:housewife Married Former Smoker Alcohol use-no Drug use-no Regular exercise-no Daily Caffeine Use  Review of Systems      See HPI  Physical Exam  General:  Well-developed,well-nourished,in no acute distress; alert,appropriate and cooperative throughout examination Ears:  External ear exam shows no significant lesions or deformities.  Otoscopic examination reveals clear canals, tympanic membranes are intact bilaterally without bulging, retraction, inflammation or discharge. Hearing is grossly normal bilaterally. Nose:  External nasal examination shows no deformity or inflammation. Nasal mucosa are pink and moist without lesions or exudates. Mouth:  Oral mucosa and oropharynx without lesions or exudates.  Teeth in good repair. Neck:  No deformities, masses, or tenderness noted. Lungs:  Normal respiratory effort, chest expands symmetrically. Lungs are clear to auscultation, no crackles or wheezes. Heart:  normal rate and no murmur.   Msk:  normal ROM, no joint tenderness, no joint swelling, no joint warmth, no redness over joints, no joint deformities, no joint instability, and no crepitation.   Extremities:  No clubbing, cyanosis, edema, or deformity noted with normal full range of motion of all joints.   Neurologic:  alert & oriented X3, cranial nerves II-XII intact, strength normal in all extremities, and gait normal.   Skin:  Intact  without suspicious lesions or rashes Psych:  Cognition and judgment appear intact. Alert and cooperative with normal attention span and concentration. No apparent delusions, illusions, hallucinations   Impression & Recommendations:  Problem # 1:  DIZZINESS (ICD-780.4)  Orders: Venipuncture  (16109) T-Vitamin D (25-Hydroxy) (60454-09811) TLB-B12 + Folate Pnl (91478_29562-Z30/QMV) TLB-BMP (Basic Metabolic Panel-BMET) (80048-METABOL) TLB-CBC Platelet - w/Differential (85025-CBCD) TLB-TSH (Thyroid Stimulating Hormone) (84443-TSH) TLB-Hepatic/Liver Function Pnl (80076-HEPATIC) TLB-Lipid Panel (80061-LIPID) EKG w/ Interpretation (93000)  Demonstrated maneuvers to self-treat vertigo. Patient to call to be seen if no improvement in 10-14 days, sooner if worse.   Problem # 2:  HYPERLIPIDEMIA (ICD-272.4)  Her updated medication list for this problem includes:    Crestor 10 Mg Tabs (Rosuvastatin calcium) .Marland Kitchen... Take one tablet every evening at bedtime.    Fenofibrate 160 Mg Tabs (Fenofibrate) .Marland Kitchen... 1 by mouth once daily  Orders: Venipuncture (78469) T-Vitamin D (25-Hydroxy) [phone]) TLB-B12 + Folate Pnl (82746_82607-B12/FOL) TLB-BMP (Basic Metabolic Panel-BMET) (80048-METABOL) TLB-CBC Platelet - w/Differential (85025-CBCD) TLB-TSH (Thyroid Stimulating Hormone) (84443-TSH) TLB-Hepatic/Liver Function Pnl (80076-HEPATIC) TLB-Lipid Panel (80061-LIPID) EKG w/ Interpretation (93000)  Problem # 3:  HYPOTHYROIDISM (ICD-244.9)  Her updated medication list for this problem includes:    Synthroid 112 Mcg Tabs (Levothyroxine sodium) .Marland Kitchen... Take 1 tablet by mouth once a day  Orders: Venipuncture (44010) T-Vitamin D (25-Hydroxy) (27253-66440) TLB-B12 + Folate Pnl (34742_59563-O75/IEP) TLB-BMP (Basic Metabolic Panel-BMET) (80048-METABOL) TLB-CBC Platelet - w/Differential (85025-CBCD) TLB-TSH (Thyroid Stimulating Hormone) (84443-TSH) TLB-Hepatic/Liver Function Pnl (80076-HEPATIC) TLB-Lipid Panel (80061-LIPID) EKG w/ Interpretation (93000)  Problem # 4:  COPD (ICD-496)  Orders: EKG w/ Interpretation (93000)  Complete Medication List: 1)  Synthroid 112 Mcg Tabs (Levothyroxine sodium) .... Take 1 tablet by mouth once a day 2)  Crestor 10 Mg Tabs (Rosuvastatin calcium) .... Take  one tablet every evening at bedtime. 3)  Caltrate 600+d 600-400 Mg-unit Tabs (Calcium carbonate-vitamin d) .... Daily 4)  Omega-3 350 Mg Caps (Omega-3 fatty acids) .... Daily 5)  Fenofibrate 160 Mg Tabs (Fenofibrate) .Marland Kitchen.. 1 by mouth once daily 6)  Vitamin D 32951 Unit Caps (Ergocalciferol) .Marland Kitchen.. 1 cap weekly 7)  Tamoxifen Citrate 20 Mg Tabs (Tamoxifen citrate) .Marland Kitchen.. 1 by mouth daily 8)  Zostavax 88416 Unt/0.71ml Solr (Zoster vaccine live) .Marland Kitchen.. 1 ml im x1   EKG  Procedure date:  06/11/2009  Findings:      Sinus bradycardia with rate of:  58 bpm

## 2010-06-10 NOTE — Letter (Signed)
Summary: Results Follow up Letter  [name] at [facility]  [address] [place], [place] [address]   Phone: [phone]  Fax: [phone]    10/29/2009 MRN: [id number]  Carrie Hall [address] [place], [place]  [address]  Dear Ms. Rappaport,  The following are the results of your recent test(s):  Test         Result    Pap Smear:        Normal __X___  Not Normal _____ Comments: ______________________________________________________ Cholesterol: LDL(Bad cholesterol):         Your goal is less than:         HDL (Good cholesterol):       Your goal is more than: Comments:  ______________________________________________________ Mammogram:        Normal _____  Not Normal _____ Comments:  ___________________________________________________________________ Hemoccult:        Normal _____  Not normal _______ Comments:    _____________________________________________________________________ Other Tests:    We routinely do not discuss normal results over the telephone.  If you desire a copy of the results, or you have any questions about this information we can discuss them at your next office visit.   Sincerely,    Army Fossa CMA  October 29, 2009 8:27 AM

## 2010-06-10 NOTE — Letter (Signed)
[facility]   Imported By: Lanelle Bal 07/17/2009 10:08:09  _____________________________________________________________________  External Attachment:    Type:   Image     Comment:   External Document

## 2010-07-06 ENCOUNTER — Encounter (HOSPITAL_BASED_OUTPATIENT_CLINIC_OR_DEPARTMENT_OTHER): Payer: Medicare Other | Admitting: Oncology

## 2010-07-06 ENCOUNTER — Other Ambulatory Visit (HOSPITAL_COMMUNITY): Payer: Self-pay | Admitting: Oncology

## 2010-07-06 ENCOUNTER — Encounter: Payer: Self-pay | Admitting: Family Medicine

## 2010-07-06 DIAGNOSIS — D0590 Unspecified type of carcinoma in situ of unspecified breast: Secondary | ICD-10-CM

## 2010-07-06 DIAGNOSIS — C50419 Malignant neoplasm of upper-outer quadrant of unspecified female breast: Secondary | ICD-10-CM

## 2010-07-06 DIAGNOSIS — Z170 Estrogen receptor positive status [ER+]: Secondary | ICD-10-CM

## 2010-07-06 LAB — CBC WITH DIFFERENTIAL/PLATELET
BASO%: 0.3 % (ref 0.0–2.0)
HCT: 42 % (ref 34.8–46.6)
LYMPH%: 28.6 % (ref 14.0–49.7)
MCH: 28.6 pg (ref 25.1–34.0)
MCHC: 33.8 g/dL (ref 31.5–36.0)
MCV: 84.7 fL (ref 79.5–101.0)
MONO#: 0.5 10*3/uL (ref 0.1–0.9)
MONO%: 6.5 % (ref 0.0–14.0)
NEUT%: 61.6 % (ref 38.4–76.8)
Platelets: 230 10*3/uL (ref 145–400)
WBC: 7.3 10*3/uL (ref 3.9–10.3)

## 2010-07-06 LAB — COMPREHENSIVE METABOLIC PANEL
AST: 17 U/L (ref 0–37)
BUN: 13 mg/dL (ref 6–23)
CO2: 32 mEq/L (ref 19–32)
Calcium: 9.6 mg/dL (ref 8.4–10.5)
Chloride: 101 mEq/L (ref 96–112)
Creatinine, Ser: 0.78 mg/dL (ref 0.40–1.20)
Total Bilirubin: 0.4 mg/dL (ref 0.3–1.2)

## 2010-07-22 LAB — CBC
HCT: 38.4 % (ref 36.0–46.0)
Hemoglobin: 13.3 g/dL (ref 12.0–15.0)
MCV: 89.6 fL (ref 78.0–100.0)
WBC: 6.7 10*3/uL (ref 4.0–10.5)

## 2010-07-22 LAB — COMPREHENSIVE METABOLIC PANEL
ALT: 19 U/L (ref 0–35)
Alkaline Phosphatase: 49 U/L (ref 39–117)
CO2: 30 mEq/L (ref 19–32)
GFR calc non Af Amer: >60 mL/min (ref >60)
Glucose, Bld: 92 mg/dL (ref 70–99)
Potassium: 4.1 mEq/L (ref 3.5–5.1)
Sodium: 140 mEq/L (ref 135–145)
Total Bilirubin: 0.5 mg/dL (ref 0.3–1.2)

## 2010-07-27 NOTE — Letter (Signed)
Summary: [facility]  [facility]   Imported By: Maryln Gottron 07/21/2010 15:17:26  _____________________________________________________________________  External Attachment:    Type:   Image     Comment:   External Document

## 2010-09-24 NOTE — Op Note (Signed)
Carrie Hall, Carrie Hall                ACCOUNT NO.:  [id number]   MEDICAL RECORD NO.:  [id number]          PATIENT TYPE:  AMB   LOCATION:  [facility]                          FACILITY:  [facility]   PHYSICIAN:  Angelia Mould. Derrell Lolling, M.D.DATE OF BIRTH:  [birth year]   DATE OF PROCEDURE:  08/02/2006  DATE OF DISCHARGE:                               OPERATIVE REPORT   PREOPERATIVE DIAGNOSIS:  Abnormal mammogram, left breast.   POSTOPERATIVE DIAGNOSIS:  Abnormal mammogram, left breast.   OPERATION PERFORMED:  Left breast biopsy with needle localization and  specimen mammogram.   SURGEON:  Angelia Mould. Derrell Lolling, M.D.   OPERATIVE INDICATIONS:  This is a 77 year old white female who has not  had any breast problems or breast symptoms in the past.  Recent imaging  studies show a small hypoechoic at the 2 o'clock position of the left  breast, approximately 5-6 cm from the nipple.  This measured 5 mm in  maximum dimension.  The pattern suggested a radial scar.  They did not  think they could do an image-guided biopsy and recommended needle  localization and excision.  She underwent needle localization this  morning at the [facility] of [place].  She is brought to operating  room electively.   OPERATIVE TECHNIQUE:  After review of the films, I felt that the wire  localization was in good position.  The patient was brought to operating  room.  General anesthesia was induced.  Left breast was prepped and  draped in sterile fashion.  Intravenous antibiotics were given prior to  the incision.  The patient was identified as to correct patient, correct  procedure and correct site.  Marcaine with epinephrine was used as a  local infiltration anesthetic.  A curved circumareolar incision was made  in the upper outer quadrant of the left breast about 3 cm peripheral to  the areolar margin.  Dissection was carried down into the subcutaneous  tissue and then into the breast tissue.  Using the wire as a guide, I  dissected all the way around the wire and the wire tip.  The specimen  was marked with silk sutures to orient the pathologist.  Specimen  mammogram was performed by Dr. Cain Saupe.  I spoke to Dr. Deboraha Sprang  and she said that we had completely removed this area.  It was sent to  pathology for routine histology.  Hemostasis was excellent and achieved  with electrocautery.  The wound was irrigated with several hundred cc of  saline.  The deeper breast tissue was closed with interrupted sutures of  3-0 Vicryl.  Skin closed with running subcuticular suture of 4-0  Monocryl and Steri-Strips.  Kling bandages were placed and the patient  taken to the recovery room in stable condition.  Estimated blood loss  was about 10 mL.  Complications none.  Sponge, needle and instrument  counts were correct.      Angelia Mould. Derrell Lolling, M.D.  Electronically Signed    HMI/MEDQ  D:  08/02/2006  T:  08/02/2006  Job:  [id number]   cc:   Lelon Perla, DO

## 2010-11-11 ENCOUNTER — Ambulatory Visit (INDEPENDENT_AMBULATORY_CARE_PROVIDER_SITE_OTHER): Payer: Medicare Other | Admitting: Family Medicine

## 2010-11-11 ENCOUNTER — Encounter: Payer: Self-pay | Admitting: Family Medicine

## 2010-11-11 VITALS — BP 130/80 | HR 77 | Temp 98.7°F | Wt 182.0 lb

## 2010-11-11 DIAGNOSIS — N390 Urinary tract infection, site not specified: Secondary | ICD-10-CM

## 2010-11-11 LAB — POCT URINALYSIS DIPSTICK
Nitrite, UA: NEGATIVE
Protein, UA: NEGATIVE
Urobilinogen, UA: 0.2
pH, UA: 7.5

## 2010-11-11 MED ORDER — CEPHALEXIN 500 MG PO CAPS: 500.0000 mg | ORAL_CAPSULE | Freq: Two times a day (BID) | ORAL | Status: AC

## 2010-11-11 NOTE — Progress Notes (Signed)
  Subjective:    Carrie Hall is a 77 y.o. female who complains of dysuria, suprapubic pressure and itchy. She has had symptoms for several weeks. Patient also complains of vaginal itchy,  no dc. Patient denies back pain, congestion, cough, fever, headache, rhinitis, sorethroat, stomach ache and vaginal discharge. Patient does not have a history of recurrent UTI. Patient does not have a history of pyelonephritis.   The following portions of the patient's history were reviewed and updated as appropriate: allergies, current medications, past family history, past medical history, past social history, past surgical history and problem list.  Review of Systems Pertinent items are noted in HPI.    Objective:    BP 130/80  Pulse 77  Temp(Src) 98.7 F (37.1 C) (Oral)  Wt 182 lb (82.555 kg)  SpO2 96% General appearance: alert and cooperative Abdomen: soft, non-tender; bowel sounds normal; no masses,  no organomegaly Lymph nodes: Cervical, supraclavicular, and axillary nodes normal.  Laboratory:  Urine dipstick: trace for hemoglobin and small for leukocyte esterase.   Micro exam: not done.    Assessment:    UTI     Plan:    Medications: keflex.

## 2010-11-11 NOTE — Patient Instructions (Signed)
Urinary Tract Infection (UTI)   Infections of the urinary tract can start in several places. A bladder infection (cystitis), a kidney infection (pyelonephritis), and a prostate infection (prostatitis) are different types of urinary tract infections. They usually get better if treated with medicines (antibiotics) that kill germs. Take all the medicine until it is gone. You or your child may feel better in a few days, but TAKE ALL MEDICINE or the infection may not respond and may become more difficult to treat.   HOME CARE INSTRUCTIONS   Drink enough water and fluids to keep the urine clear or pale yellow. Cranberry juice is especially recommended, in addition to large amounts of water.   Avoid caffeine, tea, and carbonated beverages. They tend to irritate the bladder.   Alcohol may irritate the prostate.   Only take over-the-counter or prescription medicines for pain, discomfort, or fever as directed by your caregiver.   FINDING OUT THE RESULTS OF YOUR TEST   Not all test results are available during your visit. If your or your child's test results are not back during the visit, make an appointment with your caregiver to find out the results. Do not assume everything is normal if you have not heard from your caregiver or the medical facility. It is important for you to follow up on all test results.   TO PREVENT FURTHER INFECTIONS:   Empty the bladder often. Avoid holding urine for long periods of time.   After a bowel movement, women should cleanse from front to back. Use each tissue only once.   Empty the bladder before and after sexual intercourse.   SEEK MEDICAL CARE IF:   There is back pain.   You or your child has an oral temperature above 100.4.   Your baby is older than 3 months with a rectal temperature of 100.5º F (38.1° C) or higher for more than 1 day.   Your or your child's problems (symptoms) are no better in 3 days. Return sooner if you or your child is getting worse.   SEEK IMMEDIATE MEDICAL CARE IF:    There is severe back pain or lower abdominal pain.   You or your child develops chills.   You or your child has an oral temperature above 100.4, not controlled by medicine.   Your baby is older than 3 months with a rectal temperature of 102º F (38.9º C) or higher.   Your baby is 3 months old or younger with a rectal temperature of 100.4º F (38º C) or higher.   There is nausea or vomiting.   There is continued burning or discomfort with urination.   MAKE SURE YOU:   Understand these instructions.   Will watch this condition.   Will get help right away if you or your child is not doing well or gets worse.   Document Released: 02/02/2005 Document Re-Released: 07/20/2009   [employer]® Patient Information ©2011 [employer].

## 2011-01-04 ENCOUNTER — Other Ambulatory Visit (HOSPITAL_COMMUNITY): Payer: Self-pay | Admitting: Oncology

## 2011-01-04 ENCOUNTER — Encounter (HOSPITAL_BASED_OUTPATIENT_CLINIC_OR_DEPARTMENT_OTHER): Payer: Medicare Other | Admitting: Oncology

## 2011-01-04 DIAGNOSIS — C50419 Malignant neoplasm of upper-outer quadrant of unspecified female breast: Secondary | ICD-10-CM

## 2011-01-04 DIAGNOSIS — D0590 Unspecified type of carcinoma in situ of unspecified breast: Secondary | ICD-10-CM

## 2011-01-04 DIAGNOSIS — Z170 Estrogen receptor positive status [ER+]: Secondary | ICD-10-CM

## 2011-01-04 LAB — CBC WITH DIFFERENTIAL/PLATELET
Basophils Absolute: 0 10*3/uL (ref 0.0–0.1)
Eosinophils Absolute: 0.1 10*3/uL (ref 0.0–0.5)
HCT: 42.3 % (ref 34.8–46.6)
LYMPH%: 22 % (ref 14.0–49.7)
MCV: 86.7 fL (ref 79.5–101.0)
MONO#: 0.6 10*3/uL (ref 0.1–0.9)
MONO%: 8.6 % (ref 0.0–14.0)
NEUT#: 4.6 10*3/uL (ref 1.5–6.5)
NEUT%: 67.5 % (ref 38.4–76.8)
Platelets: 220 10*3/uL (ref 145–400)
WBC: 6.9 10*3/uL (ref 3.9–10.3)

## 2011-01-04 LAB — COMPREHENSIVE METABOLIC PANEL
ALT: 18 U/L (ref 0–35)
AST: 15 U/L (ref 0–37)
CO2: 29 mEq/L (ref 19–32)
Sodium: 141 mEq/L (ref 135–145)
Total Bilirubin: 0.3 mg/dL (ref 0.3–1.2)
Total Protein: 6.9 g/dL (ref 6.0–8.3)

## 2011-01-04 LAB — LACTATE DEHYDROGENASE: LDH: 157 U/L (ref 94–250)

## 2011-03-15 ENCOUNTER — Ambulatory Visit (INDEPENDENT_AMBULATORY_CARE_PROVIDER_SITE_OTHER): Payer: Medicare Other

## 2011-03-15 DIAGNOSIS — Z23 Encounter for immunization: Secondary | ICD-10-CM

## 2011-04-26 ENCOUNTER — Other Ambulatory Visit: Payer: Self-pay | Admitting: Family Medicine

## 2011-04-26 ENCOUNTER — Ambulatory Visit (INDEPENDENT_AMBULATORY_CARE_PROVIDER_SITE_OTHER): Payer: Medicare Other | Admitting: Family Medicine

## 2011-04-26 ENCOUNTER — Encounter: Payer: Self-pay | Admitting: Family Medicine

## 2011-04-26 VITALS — BP 132/76 | HR 73 | Temp 98.6°F | Ht 65.0 in | Wt 177.4 lb

## 2011-04-26 DIAGNOSIS — E039 Hypothyroidism, unspecified: Secondary | ICD-10-CM

## 2011-04-26 DIAGNOSIS — N760 Acute vaginitis: Secondary | ICD-10-CM

## 2011-04-26 DIAGNOSIS — E785 Hyperlipidemia, unspecified: Secondary | ICD-10-CM

## 2011-04-26 LAB — POCT URINALYSIS DIPSTICK
Bilirubin, UA: NEGATIVE
Blood, UA: NEGATIVE
Ketones, UA: NEGATIVE
Protein, UA: NEGATIVE
pH, UA: 7

## 2011-04-26 MED ORDER — FLUCONAZOLE 150 MG PO TABS: 150.0000 mg | ORAL_TABLET | Freq: Once | ORAL | Status: AC

## 2011-04-26 MED ORDER — NYSTATIN 100000 UNIT/GM EX POWD: Freq: Four times a day (QID) | CUTANEOUS | Status: DC

## 2011-04-26 MED ORDER — ROSUVASTATIN CALCIUM 10 MG PO TABS: 10.0000 mg | ORAL_TABLET | Freq: Every day | ORAL | Status: DC

## 2011-04-26 MED ORDER — LEVOTHYROXINE SODIUM 112 MCG PO TABS: 112.0000 ug | ORAL_TABLET | Freq: Every day | ORAL | Status: DC

## 2011-04-26 MED ORDER — FENOFIBRATE 160 MG PO TABS: 160.0000 mg | ORAL_TABLET | Freq: Every day | ORAL | Status: DC

## 2011-04-26 NOTE — Patient Instructions (Signed)
Back Pain, Adult Low back pain is very common. About 1 in 5 people have back pain.The cause of low back pain is rarely dangerous. The pain often gets better over time.About half of people with a sudden onset of back pain feel better in just 2 weeks. About 8 in 10 people feel better by 6 weeks.  CAUSES Some common causes of back pain include:  Strain of the muscles or ligaments supporting the spine.   Wear and tear (degeneration) of the spinal discs.   Arthritis.   Direct injury to the back.  DIAGNOSIS Most of the time, the direct cause of low back pain is not known.However, back pain can be treated effectively even when the exact cause of the pain is unknown.Answering your caregiver's questions about your overall health and symptoms is one of the most accurate ways to make sure the cause of your pain is not dangerous. If your caregiver needs more information, he or she may order lab work or imaging tests (X-rays or MRIs).However, even if imaging tests show changes in your back, this usually does not require surgery. HOME CARE INSTRUCTIONS For many people, back pain returns.Since low back pain is rarely dangerous, it is often a condition that people can learn to manageon their own.   Remain active. It is stressful on the back to sit or stand in one place. Do not sit, drive, or stand in one place for more than 30 minutes at a time. Take short walks on level surfaces as soon as pain allows.Try to increase the length of time you walk each day.   Do not stay in bed.Resting more than 1 or 2 days can delay your recovery.   Do not avoid exercise or work.Your body is made to move.It is not dangerous to be active, even though your back may hurt.Your back will likely heal faster if you return to being active before your pain is gone.   Pay attention to your body when you bend and lift. Many people have less discomfortwhen lifting if they bend their knees, keep the load close to their  bodies,and avoid twisting. Often, the most comfortable positions are those that put less stress on your recovering back.   Find a comfortable position to sleep. Use a firm mattress and lie on your side with your knees slightly bent. If you lie on your back, put a pillow under your knees.   Only take over-the-counter or prescription medicines as directed by your caregiver. Over-the-counter medicines to reduce pain and inflammation are often the most helpful.Your caregiver may prescribe muscle relaxant drugs.These medicines help dull your pain so you can more quickly return to your normal activities and healthy exercise.   Put ice on the injured area.   Put ice in a plastic bag.   Place a towel between your skin and the bag.   Leave the ice on for 15 to 20 minutes, 3 to 4 times a day for the first 2 to 3 days. After that, ice and heat may be alternated to reduce pain and spasms.   Ask your caregiver about trying back exercises and gentle massage. This may be of some benefit.   Avoid feeling anxious or stressed.Stress increases muscle tension and can worsen back pain.It is important to recognize when you are anxious or stressed and learn ways to manage it.Exercise is a great option.  SEEK MEDICAL CARE IF:  You have pain that is not relieved with rest or medicine.   You have   pain that does not improve in 1 week.   You have new symptoms.   You are generally not feeling well.  SEEK IMMEDIATE MEDICAL CARE IF:   You have pain that radiates from your back into your legs.   You develop new bowel or bladder control problems.   You have unusual weakness or numbness in your arms or legs.   You develop nausea or vomiting.   You develop abdominal pain.   You feel faint.  Document Released: 04/25/2005 Document Revised: 01/05/2011 Document Reviewed: 09/13/2010 [employer] Patient Information 2012 [employer]. 

## 2011-04-26 NOTE — Progress Notes (Signed)
  Subjective:    Carrie Hall is a 77 y.o. female who presents for evaluation of low back pain. The patient has had no prior back problems. Symptoms have been present for a few days and are unchanged.  Onset was related to / precipitated by no known injury. The pain is located in the across the lower back and does not radiate. The pain is described as aching and occurs all day. She rates her pain as moderate. Symptoms are exacerbated by nothing in particular. Symptoms are improved by nothing. She has also tried nothing which provided no symptom relief. She has vaginal d/c associated with the back pain. The patient has no "red flag" history indicative of complicated back pain.  The following portions of the patient's history were reviewed and updated as appropriate: allergies, current medications, past family history, past medical history, past social history, past surgical history and problem list.  Review of Systems Pertinent items are noted in HPI.    Objective:   Normal reflexes, gait, strength and negative straight-leg raise. Inspection and palpation: inspection of back is normal. antalgic gait    Assessment:    Nonspecific acute low back pain    Plan:    Natural history and expected course discussed. Questions answered. Agricultural engineer distributed. Short (2-4 day) period of relative rest recommended until acute symptoms improve. Ice to affected area as needed for local pain relief. Heat to affected area as needed for local pain relief.

## 2011-04-27 LAB — CBC WITH DIFFERENTIAL/PLATELET
Basophils Absolute: 0 10*3/uL (ref 0.0–0.1)
Eosinophils Absolute: 0.2 10*3/uL (ref 0.0–0.7)
Lymphocytes Relative: 32.6 % (ref 12.0–46.0)
MCHC: 35.8 g/dL (ref 30.0–36.0)
MCV: 89.3 fl (ref 78.0–100.0)
Monocytes Absolute: 0.8 10*3/uL (ref 0.1–1.0)
Neutrophils Relative %: 55.5 % (ref 43.0–77.0)
Platelets: 262 10*3/uL (ref 150.0–400.0)
RBC: 4.6 Mil/uL (ref 3.87–5.11)
RDW: 13.5 % (ref 11.5–14.6)

## 2011-04-27 LAB — HEPATIC FUNCTION PANEL
Alkaline Phosphatase: 77 U/L (ref 39–117)
Bilirubin, Direct: 0.1 mg/dL (ref 0.0–0.3)

## 2011-04-27 LAB — LIPID PANEL
HDL: 52.8 mg/dL (ref >39.00)
Triglycerides: 139 mg/dL (ref 0.0–149.0)
VLDL: 27.8 mg/dL (ref 0.0–40.0)

## 2011-04-27 LAB — BASIC METABOLIC PANEL
BUN: 19 mg/dL (ref 6–23)
CO2: 27 mEq/L (ref 19–32)
Calcium: 9.8 mg/dL (ref 8.4–10.5)
Creatinine, Ser: 0.8 mg/dL (ref 0.4–1.2)
Glucose, Bld: 94 mg/dL (ref 70–99)

## 2011-04-27 LAB — LDL CHOLESTEROL, DIRECT: Direct LDL: 134.2 mg/dL

## 2011-04-27 LAB — TSH: TSH: 3.12 u[IU]/mL (ref 0.35–5.50)

## 2011-04-28 ENCOUNTER — Encounter: Payer: Medicare Other | Admitting: Family Medicine

## 2011-04-28 LAB — WET PREP BY MOLECULAR PROBE
Candida species: NEGATIVE
Gardnerella vaginalis: NEGATIVE
Trichomonas vaginosis: NEGATIVE

## 2011-04-30 LAB — URINE CULTURE

## 2011-05-04 MED ORDER — CIPROFLOXACIN HCL 500 MG PO TABS: 500.0000 mg | ORAL_TABLET | Freq: Two times a day (BID) | ORAL | Status: AC

## 2011-05-04 MED ORDER — ROSUVASTATIN CALCIUM 20 MG PO TABS: 20.0000 mg | ORAL_TABLET | Freq: Every day | ORAL | Status: DC

## 2011-05-28 ENCOUNTER — Telehealth: Payer: Self-pay | Admitting: Oncology

## 2011-05-28 NOTE — Telephone Encounter (Signed)
Talked to pt, gave her appt on2/28/13,lab and MD

## 2011-06-17 ENCOUNTER — Encounter: Payer: Self-pay | Admitting: Family Medicine

## 2011-07-02 ENCOUNTER — Emergency Department
Admission: EM | Admit: 2011-07-02 | Discharge: 2011-07-02 | Disposition: A | Discharge status: Home / Self Care | Payer: Medicare Other | Source: Home / Self Care | Attending: Emergency Medicine | Admitting: Emergency Medicine

## 2011-07-02 DIAGNOSIS — J069 Acute upper respiratory infection, unspecified: Secondary | ICD-10-CM

## 2011-07-02 MED ORDER — CLARITHROMYCIN 500 MG PO TABS: 500.0000 mg | ORAL_TABLET | Freq: Two times a day (BID) | ORAL | Status: AC

## 2011-07-02 NOTE — ED Provider Notes (Signed)
History     CSN: [id number]  Arrival date & time 07/02/11  1148   First MD Initiated Contact with Patient 07/02/11 1206      No chief complaint on file.   (Consider location/radiation/quality/duration/timing/severity/associated sxs/prior treatment) HPI Carrie Hall is a 78 y.o. female who complains of onset of cold symptoms for 7 days.  No sore throat +/- cough.  Robitussin is helping. No pleuritic pain No wheezing + nasal congestion + post-nasal drainage No sinus pain/pressure + upper chest congestion No itchy/red eyes No earache No hemoptysis No SOB No chills/sweats No fever No nausea No vomiting No abdominal pain No diarrhea No skin rashes No fatigue No myalgias No headache    Past Medical History  Diagnosis Date  . Thyroid disease   . Hyperlipidemia   . COPD (chronic obstructive pulmonary disease)   . Osteopenia     Past Surgical History  Procedure Date  . Colon polyps     Family History  Problem Relation Age of Onset  . Alzheimer's disease Mother   . Cancer Father     lung    History  Substance Use Topics  . Smoking status: Never Smoker   . Smokeless tobacco: Not on file  . Alcohol Use: Not on file    OB History    Grav Para Term Preterm Abortions TAB SAB Ect Mult Living                  Review of Systems  All other systems reviewed and are negative.    Allergies  Review of patient's allergies indicates no known allergies.  Home Medications   Current Outpatient Rx  Name Route Sig Dispense Refill  . FENOFIBRATE 160 MG PO TABS Oral Take 1 tablet (160 mg total) by mouth daily. 90 tablet 3  . LEVOTHYROXINE SODIUM 112 MCG PO TABS Oral Take 1 tablet (112 mcg total) by mouth daily. 90 tablet 3  . NYSTATIN 100000 UNIT/GM EX POWD Topical Apply topically 4 (four) times daily. 15 g 0  . ROSUVASTATIN CALCIUM 20 MG PO TABS Oral Take 1 tablet (20 mg total) by mouth daily. 90 tablet 3    There were no vitals taken for this visit.  Physical  Exam  Nursing note and vitals reviewed. Constitutional: She is oriented to person, place, and time. She appears well-developed and well-nourished. She does not have a sickly appearance. She does not appear ill.  HENT:  Head: Normocephalic and atraumatic.  Right Ear: Tympanic membrane, external ear and ear canal normal.  Left Ear: Tympanic membrane, external ear and ear canal normal.  Nose: Mucosal edema and rhinorrhea present.  Mouth/Throat: Posterior oropharyngeal erythema present. No oropharyngeal exudate or posterior oropharyngeal edema.  Eyes: No scleral icterus.  Neck: Neck supple.  Cardiovascular: Regular rhythm and normal heart sounds.   Pulmonary/Chest: Effort normal and breath sounds normal. No respiratory distress.  Neurological: She is alert and oriented to person, place, and time.  Skin: Skin is warm and dry.  Psychiatric: She has a normal mood and affect. Her speech is normal.    ED Course  Procedures (including critical care time)  Labs Reviewed - No data to display No results found.   1. Acute upper respiratory infections of unspecified site       MDM  1)  Take the prescribed antibiotic as instructed. 2)  Use nasal saline solution (over the counter) at least 3 times a day. 3)  Use over the counter decongestants like Zyrtec-D every 12 hours  as needed to help with congestion.  If you have hypertension, do not take medicines with sudafed.  4)  Can take tylenol every 6 hours or motrin every 8 hours for pain or fever. 5)  Follow up with your primary doctor if no improvement in 5-7 days, sooner if increasing pain, fever, or new symptoms.     Lily Kocher, MD 07/02/11 [phone]

## 2011-07-02 NOTE — ED Notes (Signed)
States symptoms started one week ago 

## 2011-07-07 ENCOUNTER — Other Ambulatory Visit (HOSPITAL_BASED_OUTPATIENT_CLINIC_OR_DEPARTMENT_OTHER): Payer: Medicare Other | Admitting: Lab

## 2011-07-07 ENCOUNTER — Ambulatory Visit: Payer: Medicare Other | Admitting: Physician Assistant

## 2011-07-07 ENCOUNTER — Ambulatory Visit (HOSPITAL_BASED_OUTPATIENT_CLINIC_OR_DEPARTMENT_OTHER): Payer: Medicare Other | Admitting: Physician Assistant

## 2011-07-07 VITALS — BP 120/70 | HR 52 | Temp 98.9°F | Ht 65.0 in | Wt 174.3 lb

## 2011-07-07 DIAGNOSIS — D0590 Unspecified type of carcinoma in situ of unspecified breast: Secondary | ICD-10-CM

## 2011-07-07 DIAGNOSIS — Z853 Personal history of malignant neoplasm of breast: Secondary | ICD-10-CM

## 2011-07-07 DIAGNOSIS — Z170 Estrogen receptor positive status [ER+]: Secondary | ICD-10-CM

## 2011-07-07 LAB — CBC WITH DIFFERENTIAL/PLATELET
BASO%: 0.5 % (ref 0.0–2.0)
Basophils Absolute: 0 10*3/uL (ref 0.0–0.1)
Eosinophils Absolute: 0.1 10*3/uL (ref 0.0–0.5)
HCT: 42.5 % (ref 34.8–46.6)
HGB: 14.3 g/dL (ref 11.6–15.9)
MCHC: 33.6 g/dL (ref 31.5–36.0)
MONO#: 0.6 10*3/uL (ref 0.1–0.9)
NEUT#: 3.8 10*3/uL (ref 1.5–6.5)
NEUT%: 63.9 % (ref 38.4–76.8)
WBC: 5.9 10*3/uL (ref 3.9–10.3)
lymph#: 1.4 10*3/uL (ref 0.9–3.3)

## 2011-07-07 LAB — COMPREHENSIVE METABOLIC PANEL
AST: 18 U/L (ref 0–37)
Albumin: 4.3 g/dL (ref 3.5–5.2)
BUN: 12 mg/dL (ref 6–23)
Calcium: 9.9 mg/dL (ref 8.4–10.5)
Chloride: 102 mEq/L (ref 96–112)
Potassium: 4.4 mEq/L (ref 3.5–5.3)

## 2011-07-07 NOTE — Progress Notes (Signed)
[facility] OFFICE PROGRESS NOTE  Loreen Freud, DO, DO CC: Angelia Mould. Derrell Lolling, M.D.  Maryln Gottron, M.D.  Guy Sandifer Henderson Cloud, M.D.   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 Estrogen and progesterone receptors were both 88%.  The patient completed radiation treatments to the left breast from September 04, 2006, through October 19, 2006.  She had been on adjuvant/prophylactic tamoxifen from mid September 2008 through late August 2011.  Tamoxifen was discontinued at that time because the patient had developed endometrial polyps.  In mid September of last year, she underwent hysteroscopy/D and C.  The path report on the endometrial polyp was benign.  Carrie Hall was last seen here 6 months ago.  She reports no changes in her condition.  Her overall health is good.   Her last chest x-ray was January 07, 2010.  The patient had mammograms at [facility] 06/10/2011.  We are trying to update our reports.  The patient states that this mammogram was benign.  As stated, she has no symptoms to suggest a recurrence of her tumor, no breast masses, any sense of ill health.  Medicines are reviewed and recorded.   MEDICAL HISTORY: Past Medical History  Diagnosis Date  . Thyroid disease   . Hyperlipidemia   . COPD (chronic obstructive pulmonary disease)   . Osteopenia     SURGICAL HISTORY:  Past Surgical History  Procedure Date  . Colon polyps     MEDICATIONS: Current Outpatient Prescriptions  Medication Sig Dispense Refill  . clarithromycin (BIAXIN) 500 MG tablet Take 1 tablet (500 mg total) by mouth 2 (two) times daily.  20 tablet  0  . fenofibrate 160 MG tablet Take 1 tablet (160 mg total) by mouth daily.  90 tablet  3  . levothyroxine (SYNTHROID, LEVOTHROID) 112 MCG tablet Take 1 tablet (112 mcg total) by mouth daily.  90 tablet  3  . nystatin (MYCOSTATIN) powder Apply topically 4 (four) times daily.  15 g  0  .  rosuvastatin (CRESTOR) 20 MG tablet Take 1 tablet (20 mg total) by mouth daily.  90 tablet  3    ALLERGIES:   has no known allergies.  REVIEW OF SYSTEMS:  The rest of the 14-point review of system was negative.   Filed Vitals:   07/07/11 0923  BP: 120/70  Pulse: 52  Temp: 98.9 F (37.2 C)   Wt Readings from Last 3 Encounters:  07/07/11 174 lb 4.8 oz (79.062 kg)  04/26/11 177 lb 6.4 oz (80.468 kg)  11/11/10 182 lb (82.555 kg)    PHYSICAL EXAMINATION:   General:  well-nourished in no acute distress.  HEENT:  There is no scleral icterus.  Mouth and pharynx are benign.  Nodes:  No peripheral adenopathy palpable.  No axillary adenopathy.  Heart and Lungs:   Normal.  Breasts:  Left breast is only slightly smaller than the right breast, but cosmetic result is excellent.  Scar is well healed.  There is a little firmness around the incision but nothing suspicious.  Both nipples are a little flat, right more so than the left.  Right breast is benign.  Abdomen:  Somewhat obese, nontender with no organomegaly or masses palpable.  Extremities:  No peripheral edema or clubbing.  No calf tenderness.  No lymphedema of the left arm.      LABORATORY/RADIOLOGY DATA:   Lab 07/07/11 0857  WBC 5.9  HGB 14.3  HCT 42.5  PLT 269  MCV 83.2  MCH 28.0  MCHC 33.6  RDW 13.5  LYMPHSABS 1.4  MONOABS 0.6  EOSABS 0.1  BASOSABS 0.0  BANDABS --    CMP   No results found for this basename: NA:5,K:5,CL:5,CO2:5,GLUCOSE:5,BUN:5,CREATININE:5,GFRCGP,:5,CALCIUM:5,MG:5,AST:5,ALT:5,ALKPHOS:5,BILITOT:5 in the last 168 hours      Component Value Date/Time   BILITOT 0.6 04/26/2011 1451   BILIDIR 0.1 04/26/2011 1451     Radiology Studies:      ASSESSMENT AND PLAN:    Carrie Hall continues to do well now over 5 years from the time of diagnosis.  As stated, she is no longer on tamoxifen which was discontinued to year ago because of endometrial polyps.  We will try to obtain the patient's last  mammogram done earlier this year.  The patient knows to get yearly mammograms. The patient has been seen and evaluated by Dr. Arline Asp. She has expressed interest in being followed by her primary care physician with yearly mammograms. Dr. Arline Asp has agreed with the patient since she is clinically stable from the oncological standpoint. She has been instructed to call us if she has any questions or concerns. We will see her as needed. It has been a pleasure to participate in the care of Carrie Hall.

## 2012-01-26 ENCOUNTER — Ambulatory Visit (INDEPENDENT_AMBULATORY_CARE_PROVIDER_SITE_OTHER): Payer: Medicare Other | Admitting: Family Medicine

## 2012-01-26 ENCOUNTER — Encounter: Payer: Self-pay | Admitting: Family Medicine

## 2012-01-26 VITALS — BP 130/70 | HR 70 | Temp 98.6°F | Wt 174.0 lb

## 2012-01-26 DIAGNOSIS — B373 Candidiasis of vulva and vagina: Secondary | ICD-10-CM

## 2012-01-26 DIAGNOSIS — N3090 Cystitis, unspecified without hematuria: Secondary | ICD-10-CM

## 2012-01-26 DIAGNOSIS — B3731 Acute candidiasis of vulva and vagina: Secondary | ICD-10-CM

## 2012-01-26 LAB — POCT URINALYSIS DIPSTICK
Glucose, UA: NEGATIVE
Nitrite, UA: NEGATIVE
Urobilinogen, UA: 0.2
pH, UA: 6

## 2012-01-26 MED ORDER — CLOTRIMAZOLE-BETAMETHASONE 1-0.05 % EX CREA: TOPICAL_CREAM | Freq: Two times a day (BID) | CUTANEOUS | Status: DC

## 2012-01-26 NOTE — Patient Instructions (Signed)
Fungus Infection of the Skin An infection of your skin caused by a fungus is a very common problem. Treatment depends on which part of the body is affected. Types of fungal skin infection include:  Athlete's Foot(Tinea pedis). This infection starts between the toes and may involve the entire sole and sides of foot. It is the most common fungal disease. It is made worse by heat, moisture, and friction. To treat, wash your feet 2 to 3 times daily. Dry thoroughly between the toes. Use medicated foot powder or cream as directed on the package. Plain talc, cornstarch, or rice powder may be dusted into socks and shoes to keep the feet dry. Wearing footwear that allows ventilation is also helpful.   Ringworm (Tinea corporis and tinea capitis). This infection causes scaly red rings to form on the skin or scalp. For skin sores, apply medicated lotion or cream as directed on the package. For the scalp, medicated shampoo may be used with with other therapies. Ringworm of the scalp or fingernails usually requires using oral medicine for 2 to 4 months.   Tinea versicolor. This infection appears as painless, scaly, patchy areas of discolored skin (whitish to light brown). It is more common in the summer and favors oily areas of the skin such as those found at the chest, abdomen, back, pubis, neck, and body folds. It can be treated with medicated shampoo or with medicated topical cream. Oral antifungals may be needed for more active infections. The light and/or dark spots may take time to get better and is not a sign of treatment failure.  Fungal infections may need to be treated for several weeks to be cured. It is important not to treat fungal infections with steroids or combination medicine that contains an antifungal and steroid as these will make the fungal infection worse. SEEK MEDICAL CARE IF:   You have persistent itching or rawness.   You have an oral temperature above 102 F (38.9 C).  Document Released:  06/02/2004 Document Revised: 04/14/2011 Document Reviewed: 08/18/2009 [employer] Patient Information 2012 [employer]. 

## 2012-01-26 NOTE — Progress Notes (Signed)
  Subjective:    Patient ID: Carrie Hall, female    DOB: May 24, 1933, 78 y.o.   MRN: [id number]  HPI Pt here c/o ext vaginal itching since last visit nine months ago.  Pt states powder did not work.   No d/c. No dysuria , frequency or back pain.    Review of Systems    as above Objective:   Physical Exam  Constitutional: She appears well-developed and well-nourished.  Genitourinary:       Ext vag--  + errythema and escoriations from scratching               No ext lesions or d/c noted  Neurological: She is alert.  Psychiatric: She has a normal mood and affect. Her behavior is normal. Judgment and thought content normal.          Assessment & Plan:

## 2012-01-26 NOTE — Assessment & Plan Note (Signed)
lotrisone cream Call if no better

## 2012-01-28 LAB — URINE CULTURE

## 2012-03-15 ENCOUNTER — Telehealth: Payer: Self-pay | Admitting: Family Medicine

## 2012-03-15 DIAGNOSIS — E039 Hypothyroidism, unspecified: Secondary | ICD-10-CM

## 2012-03-15 MED ORDER — LEVOTHYROXINE SODIUM 112 MCG PO TABS: 112.0000 ug | ORAL_TABLET | Freq: Every day | ORAL | Status: DC

## 2012-03-15 NOTE — Telephone Encounter (Signed)
Letter mailed to schedule CPE.    KP 

## 2012-03-15 NOTE — Telephone Encounter (Signed)
Refill: Synthroid 112 mcg. 1 po daily. Qty 90.

## 2012-04-12 ENCOUNTER — Encounter: Payer: Self-pay | Admitting: Family Medicine

## 2012-06-19 ENCOUNTER — Encounter: Payer: Medicare Other | Admitting: Family Medicine

## 2012-07-09 ENCOUNTER — Encounter: Payer: Self-pay | Admitting: Family Medicine

## 2012-07-13 ENCOUNTER — Encounter: Payer: Medicare Other | Admitting: Family Medicine

## 2012-07-20 ENCOUNTER — Encounter: Payer: Self-pay | Admitting: Family Medicine

## 2012-07-20 ENCOUNTER — Ambulatory Visit (INDEPENDENT_AMBULATORY_CARE_PROVIDER_SITE_OTHER): Payer: Medicare Other | Admitting: Family Medicine

## 2012-07-20 VITALS — BP 122/80 | HR 66 | Temp 98.6°F | Ht 65.0 in | Wt 166.6 lb

## 2012-07-20 DIAGNOSIS — R739 Hyperglycemia, unspecified: Secondary | ICD-10-CM

## 2012-07-20 DIAGNOSIS — L259 Unspecified contact dermatitis, unspecified cause: Secondary | ICD-10-CM

## 2012-07-20 DIAGNOSIS — R7309 Other abnormal glucose: Secondary | ICD-10-CM

## 2012-07-20 DIAGNOSIS — E785 Hyperlipidemia, unspecified: Secondary | ICD-10-CM

## 2012-07-20 DIAGNOSIS — L309 Dermatitis, unspecified: Secondary | ICD-10-CM

## 2012-07-20 DIAGNOSIS — Z0000 Encounter for general adult medical examination without abnormal findings: Secondary | ICD-10-CM

## 2012-07-20 DIAGNOSIS — M899 Disorder of bone, unspecified: Secondary | ICD-10-CM

## 2012-07-20 DIAGNOSIS — E039 Hypothyroidism, unspecified: Secondary | ICD-10-CM

## 2012-07-20 LAB — BASIC METABOLIC PANEL
BUN: 14 mg/dL (ref 6–23)
Chloride: 103 mEq/L (ref 96–112)
Glucose, Bld: 111 mg/dL — ABNORMAL HIGH (ref 70–99)
Potassium: 3.9 mEq/L (ref 3.5–5.1)

## 2012-07-20 LAB — CBC WITH DIFFERENTIAL/PLATELET
Basophils Absolute: 0 10*3/uL (ref 0.0–0.1)
HCT: 43.2 % (ref 36.0–46.0)
Lymphs Abs: 1.6 10*3/uL (ref 0.7–4.0)
MCHC: 33.3 g/dL (ref 30.0–36.0)
MCV: 85.9 fl (ref 78.0–100.0)
Monocytes Absolute: 0.5 10*3/uL (ref 0.1–1.0)
Platelets: 227 10*3/uL (ref 150.0–400.0)
RDW: 13.6 % (ref 11.5–14.6)

## 2012-07-20 LAB — HEPATIC FUNCTION PANEL
ALT: 15 U/L (ref 0–35)
Total Bilirubin: 0.7 mg/dL (ref 0.3–1.2)

## 2012-07-20 LAB — LIPID PANEL
Cholesterol: 171 mg/dL (ref 0–200)
LDL Cholesterol: 93 mg/dL (ref 0–99)
Triglycerides: 157 mg/dL — ABNORMAL HIGH (ref 0.0–149.0)
VLDL: 31.4 mg/dL (ref 0.0–40.0)

## 2012-07-20 MED ORDER — MOMETASONE FUROATE 0.1 % EX CREA: TOPICAL_CREAM | Freq: Every day | CUTANEOUS | Status: DC

## 2012-07-20 NOTE — Patient Instructions (Addendum)
Preventive Care for Adults, Female A healthy lifestyle and preventive care can promote health and wellness. Preventive health guidelines for women include the following key practices.  A routine yearly physical is a good way to check with your caregiver about your health and preventive screening. It is a chance to share any concerns and updates on your health, and to receive a thorough exam.  Visit your dentist for a routine exam and preventive care every 6 months. Brush your teeth twice a day and floss once a day. Good oral hygiene prevents tooth decay and gum disease.  The frequency of eye exams is based on your age, health, family medical history, use of contact lenses, and other factors. Follow your caregiver's recommendations for frequency of eye exams.  Eat a healthy diet. Foods like vegetables, fruits, whole grains, low-fat dairy products, and lean protein foods contain the nutrients you need without too many calories. Decrease your intake of foods high in solid fats, added sugars, and salt. Eat the right amount of calories for you.Get information about a proper diet from your caregiver, if necessary.  Regular physical exercise is one of the most important things you can do for your health. Most adults should get at least 150 minutes of moderate-intensity exercise (any activity that increases your heart rate and causes you to sweat) each week. In addition, most adults need muscle-strengthening exercises on 2 or more days a week.  Maintain a healthy weight. The body mass index (BMI) is a screening tool to identify possible weight problems. It provides an estimate of body fat based on height and weight. Your caregiver can help determine your BMI, and can help you achieve or maintain a healthy weight.For adults 20 years and older:  A BMI below 18.5 is considered underweight.  A BMI of 18.5 to 24.9 is normal.  A BMI of 25 to 29.9 is considered overweight.  A BMI of 30 and above is  considered obese.  Maintain normal blood lipids and cholesterol levels by exercising and minimizing your intake of saturated fat. Eat a balanced diet with plenty of fruit and vegetables. Blood tests for lipids and cholesterol should begin at age 20 and be repeated every 5 years. If your lipid or cholesterol levels are high, you are over 50, or you are at high risk for heart disease, you may need your cholesterol levels checked more frequently.Ongoing high lipid and cholesterol levels should be treated with medicines if diet and exercise are not effective.  If you smoke, find out from your caregiver how to quit. If you do not use tobacco, do not start.  If you are pregnant, do not drink alcohol. If you are breastfeeding, be very cautious about drinking alcohol. If you are not pregnant and choose to drink alcohol, do not exceed 1 drink per day. One drink is considered to be 12 ounces (355 mL) of beer, 5 ounces (148 mL) of wine, or 1.5 ounces (44 mL) of liquor.  Avoid use of street drugs. Do not share needles with anyone. Ask for help if you need support or instructions about stopping the use of drugs.  High blood pressure causes heart disease and increases the risk of stroke. Your blood pressure should be checked at least every 1 to 2 years. Ongoing high blood pressure should be treated with medicines if weight loss and exercise are not effective.  If you are 55 to 77 years old, ask your caregiver if you should take aspirin to prevent strokes.  Diabetes   screening involves taking a blood sample to check your fasting blood sugar level. This should be done once every 3 years, after age 45, if you are within normal weight and without risk factors for diabetes. Testing should be considered at a younger age or be carried out more frequently if you are overweight and have at least 1 risk factor for diabetes.  Breast cancer screening is essential preventive care for women. You should practice "breast  self-awareness." This means understanding the normal appearance and feel of your breasts and may include breast self-examination. Any changes detected, no matter how small, should be reported to a caregiver. Women in their 20s and 30s should have a clinical breast exam (CBE) by a caregiver as part of a regular health exam every 1 to 3 years. After age 40, women should have a CBE every year. Starting at age 40, women should consider having a mammography (breast X-ray test) every year. Women who have a family history of breast cancer should talk to their caregiver about genetic screening. Women at a high risk of breast cancer should talk to their caregivers about having magnetic resonance imaging (MRI) and a mammography every year.  The Pap test is a screening test for cervical cancer. A Pap test can show cell changes on the cervix that might become cervical cancer if left untreated. A Pap test is a procedure in which cells are obtained and examined from the lower end of the uterus (cervix).  Women should have a Pap test starting at age 21.  Between ages 21 and 29, Pap tests should be repeated every 2 years.  Beginning at age 30, you should have a Pap test every 3 years as long as the past 3 Pap tests have been normal.  Some women have medical problems that increase the chance of getting cervical cancer. Talk to your caregiver about these problems. It is especially important to talk to your caregiver if a new problem develops soon after your last Pap test. In these cases, your caregiver may recommend more frequent screening and Pap tests.  The above recommendations are the same for women who have or have not gotten the vaccine for human papillomavirus (HPV).  If you had a hysterectomy for a problem that was not cancer or a condition that could lead to cancer, then you no longer need Pap tests. Even if you no longer need a Pap test, a regular exam is a good idea to make sure no other problems are  starting.  If you are between ages 65 and 70, and you have had normal Pap tests going back 10 years, you no longer need Pap tests. Even if you no longer need a Pap test, a regular exam is a good idea to make sure no other problems are starting.  If you have had past treatment for cervical cancer or a condition that could lead to cancer, you need Pap tests and screening for cancer for at least 20 years after your treatment.  If Pap tests have been discontinued, risk factors (such as a new sexual partner) need to be reassessed to determine if screening should be resumed.  The HPV test is an additional test that may be used for cervical cancer screening. The HPV test looks for the virus that can cause the cell changes on the cervix. The cells collected during the Pap test can be tested for HPV. The HPV test could be used to screen women aged 30 years and older, and should   be used in women of any age who have unclear Pap test results. After the age of 30, women should have HPV testing at the same frequency as a Pap test.  Colorectal cancer can be detected and often prevented. Most routine colorectal cancer screening begins at the age of 50 and continues through age 75. However, your caregiver may recommend screening at an earlier age if you have risk factors for colon cancer. On a yearly basis, your caregiver may provide home test kits to check for hidden blood in the stool. Use of a small camera at the end of a tube, to directly examine the colon (sigmoidoscopy or colonoscopy), can detect the earliest forms of colorectal cancer. Talk to your caregiver about this at age 50, when routine screening begins. Direct examination of the colon should be repeated every 5 to 10 years through age 75, unless early forms of pre-cancerous polyps or small growths are found.  Hepatitis C blood testing is recommended for all people born from 1945 through 1965 and any individual with known risks for hepatitis C.  Practice  safe sex. Use condoms and avoid high-risk sexual practices to reduce the spread of sexually transmitted infections (STIs). STIs include gonorrhea, chlamydia, syphilis, trichomonas, herpes, HPV, and human immunodeficiency virus (HIV). Herpes, HIV, and HPV are viral illnesses that have no cure. They can result in disability, cancer, and death. Sexually active women aged 25 and younger should be checked for chlamydia. Older women with new or multiple partners should also be tested for chlamydia. Testing for other STIs is recommended if you are sexually active and at increased risk.  Osteoporosis is a disease in which the bones lose minerals and strength with aging. This can result in serious bone fractures. The risk of osteoporosis can be identified using a bone density scan. Women ages 65 and over and women at risk for fractures or osteoporosis should discuss screening with their caregivers. Ask your caregiver whether you should take a calcium supplement or vitamin D to reduce the rate of osteoporosis.  Menopause can be associated with physical symptoms and risks. Hormone replacement therapy is available to decrease symptoms and risks. You should talk to your caregiver about whether hormone replacement therapy is right for you.  Use sunscreen with sun protection factor (SPF) of 30 or more. Apply sunscreen liberally and repeatedly throughout the day. You should seek shade when your shadow is shorter than you. Protect yourself by wearing long sleeves, pants, a wide-brimmed hat, and sunglasses year round, whenever you are outdoors.  Once a month, do a whole body skin exam, using a mirror to look at the skin on your back. Notify your caregiver of new moles, moles that have irregular borders, moles that are larger than a pencil eraser, or moles that have changed in shape or color.  Stay current with required immunizations.  Influenza. You need a dose every fall (or winter). The composition of the flu vaccine  changes each year, so being vaccinated once is not enough.  Pneumococcal polysaccharide. You need 1 to 2 doses if you smoke cigarettes or if you have certain chronic medical conditions. You need 1 dose at age 65 (or older) if you have never been vaccinated.  Tetanus, diphtheria, pertussis (Tdap, Td). Get 1 dose of Tdap vaccine if you are younger than age 65, are over 65 and have contact with an infant, are a healthcare worker, are pregnant, or simply want to be protected from whooping cough. After that, you need a Td   booster dose every 10 years. Consult your caregiver if you have not had at least 3 tetanus and diphtheria-containing shots sometime in your life or have a deep or dirty wound.  HPV. You need this vaccine if you are a woman age 26 or younger. The vaccine is given in 3 doses over 6 months.  Measles, mumps, rubella (MMR). You need at least 1 dose of MMR if you were born in 1957 or later. You may also need a second dose.  Meningococcal. If you are age 19 to 21 and a first-year college student living in a residence hall, or have one of several medical conditions, you need to get vaccinated against meningococcal disease. You may also need additional booster doses.  Zoster (shingles). If you are age 60 or older, you should get this vaccine.  Varicella (chickenpox). If you have never had chickenpox or you were vaccinated but received only 1 dose, talk to your caregiver to find out if you need this vaccine.  Hepatitis A. You need this vaccine if you have a specific risk factor for hepatitis A virus infection or you simply wish to be protected from this disease. The vaccine is usually given as 2 doses, 6 to 18 months apart.  Hepatitis B. You need this vaccine if you have a specific risk factor for hepatitis B virus infection or you simply wish to be protected from this disease. The vaccine is given in 3 doses, usually over 6 months. Preventive Services / Frequency Ages 19 to 39  Blood  pressure check.** / Every 1 to 2 years.  Lipid and cholesterol check.** / Every 5 years beginning at age 20.  Clinical breast exam.** / Every 3 years for women in their 20s and 30s.  Pap test.** / Every 2 years from ages 21 through 29. Every 3 years starting at age 30 through age 65 or 70 with a history of 3 consecutive normal Pap tests.  HPV screening.** / Every 3 years from ages 30 through ages 65 to 70 with a history of 3 consecutive normal Pap tests.  Hepatitis C blood test.** / For any individual with known risks for hepatitis C.  Skin self-exam. / Monthly.  Influenza immunization.** / Every year.  Pneumococcal polysaccharide immunization.** / 1 to 2 doses if you smoke cigarettes or if you have certain chronic medical conditions.  Tetanus, diphtheria, pertussis (Tdap, Td) immunization. / A one-time dose of Tdap vaccine. After that, you need a Td booster dose every 10 years.  HPV immunization. / 3 doses over 6 months, if you are 26 and younger.  Measles, mumps, rubella (MMR) immunization. / You need at least 1 dose of MMR if you were born in 1957 or later. You may also need a second dose.  Meningococcal immunization. / 1 dose if you are age 19 to 21 and a first-year college student living in a residence hall, or have one of several medical conditions, you need to get vaccinated against meningococcal disease. You may also need additional booster doses.  Varicella immunization.** / Consult your caregiver.  Hepatitis A immunization.** / Consult your caregiver. 2 doses, 6 to 18 months apart.  Hepatitis B immunization.** / Consult your caregiver. 3 doses usually over 6 months. Ages 40 to 64  Blood pressure check.** / Every 1 to 2 years.  Lipid and cholesterol check.** / Every 5 years beginning at age 20.  Clinical breast exam.** / Every year after age 40.  Mammogram.** / Every year beginning at age 40   and continuing for as long as you are in good health. Consult with your  caregiver.  Pap test.** / Every 3 years starting at age 30 through age 65 or 70 with a history of 3 consecutive normal Pap tests.  HPV screening.** / Every 3 years from ages 30 through ages 65 to 70 with a history of 3 consecutive normal Pap tests.  Fecal occult blood test (FOBT) of stool. / Every year beginning at age 50 and continuing until age 75. You may not need to do this test if you get a colonoscopy every 10 years.  Flexible sigmoidoscopy or colonoscopy.** / Every 5 years for a flexible sigmoidoscopy or every 10 years for a colonoscopy beginning at age 50 and continuing until age 75.  Hepatitis C blood test.** / For all people born from 1945 through 1965 and any individual with known risks for hepatitis C.  Skin self-exam. / Monthly.  Influenza immunization.** / Every year.  Pneumococcal polysaccharide immunization.** / 1 to 2 doses if you smoke cigarettes or if you have certain chronic medical conditions.  Tetanus, diphtheria, pertussis (Tdap, Td) immunization.** / A one-time dose of Tdap vaccine. After that, you need a Td booster dose every 10 years.  Measles, mumps, rubella (MMR) immunization. / You need at least 1 dose of MMR if you were born in 1957 or later. You may also need a second dose.  Varicella immunization.** / Consult your caregiver.  Meningococcal immunization.** / Consult your caregiver.  Hepatitis A immunization.** / Consult your caregiver. 2 doses, 6 to 18 months apart.  Hepatitis B immunization.** / Consult your caregiver. 3 doses, usually over 6 months. Ages 65 and over  Blood pressure check.** / Every 1 to 2 years.  Lipid and cholesterol check.** / Every 5 years beginning at age 20.  Clinical breast exam.** / Every year after age 40.  Mammogram.** / Every year beginning at age 40 and continuing for as long as you are in good health. Consult with your caregiver.  Pap test.** / Every 3 years starting at age 30 through age 65 or 70 with a 3  consecutive normal Pap tests. Testing can be stopped between 65 and 70 with 3 consecutive normal Pap tests and no abnormal Pap or HPV tests in the past 10 years.  HPV screening.** / Every 3 years from ages 30 through ages 65 or 70 with a history of 3 consecutive normal Pap tests. Testing can be stopped between 65 and 70 with 3 consecutive normal Pap tests and no abnormal Pap or HPV tests in the past 10 years.  Fecal occult blood test (FOBT) of stool. / Every year beginning at age 50 and continuing until age 75. You may not need to do this test if you get a colonoscopy every 10 years.  Flexible sigmoidoscopy or colonoscopy.** / Every 5 years for a flexible sigmoidoscopy or every 10 years for a colonoscopy beginning at age 50 and continuing until age 75.  Hepatitis C blood test.** / For all people born from 1945 through 1965 and any individual with known risks for hepatitis C.  Osteoporosis screening.** / A one-time screening for women ages 65 and over and women at risk for fractures or osteoporosis.  Skin self-exam. / Monthly.  Influenza immunization.** / Every year.  Pneumococcal polysaccharide immunization.** / 1 dose at age 65 (or older) if you have never been vaccinated.  Tetanus, diphtheria, pertussis (Tdap, Td) immunization. / A one-time dose of Tdap vaccine if you are over   65 and have contact with an infant, are a Research scientist (physical sciences), or simply want to be protected from whooping cough. After that, you need a Td booster dose every 10 years.  Varicella immunization.** / Consult your caregiver.  Meningococcal immunization.** / Consult your caregiver.  Hepatitis A immunization.** / Consult your caregiver. 2 doses, 6 to 18 months apart.  Hepatitis B immunization.** / Check with your caregiver. 3 doses, usually over 6 months. ** Family history and personal history of risk and conditions may change your caregiver's recommendations. Document Released: 06/21/2001 Document Revised: 07/18/2011  Document Reviewed: 09/20/2010 [facility] Patient Information 2013 [place], [place].  Get eucerin cream or aquaphor and mix the elocon half and half and use combination 1x a day.

## 2012-07-20 NOTE — Assessment & Plan Note (Signed)
Pt due for bmd---already had mammogram Pt will need to have it done with next mamm

## 2012-07-20 NOTE — Assessment & Plan Note (Signed)
Check labs Pt would like brand only

## 2012-07-20 NOTE — Assessment & Plan Note (Signed)
Check labs con't meds 

## 2012-07-20 NOTE — Progress Notes (Signed)
Subjective:    Carrie Hall is a 79 y.o. female who presents for Medicare Annual/Subsequent preventive examination.  Preventive Screening-Counseling & Management  Tobacco History  Smoking status  . Former Smoker  Smokeless tobacco  . Not on file     Problems Prior to Visit 1. none  Current Problems (verified) Patient Active Problem List  Diagnosis  . TINEA CRURIS  . HYPOTHYROIDISM  . HYPERLIPIDEMIA  . URI  . COPD  . HEMOCCULT POSITIVE STOOL  . UTI  . CERVICAL POLYP  . FOOT, PAIN  . OSTEOPENIA  . DIZZINESS  . OTHER ABNORMAL BLOOD CHEMISTRY  . BREAST CANCER, HX OF  . COLONIC POLYPS, ADENOMATOUS, HX OF  . HX, URINARY INFECTION  . HEMATURIA, HX OF  . KNEE PAIN, LEFT  . DYSURIA  . POSTMENOPAUSAL STATUS  . Vulvar candidiasis    Medications Prior to Visit Current Outpatient Prescriptions on File Prior to Visit  Medication Sig Dispense Refill  . clotrimazole-betamethasone (LOTRISONE) cream Apply topically 2 (two) times daily.  30 g  0  . fenofibrate 160 MG tablet Take 1 tablet (160 mg total) by mouth daily.  90 tablet  3  . levothyroxine (SYNTHROID, LEVOTHROID) 112 MCG tablet Take 1 tablet (112 mcg total) by mouth daily.  90 tablet  0  . rosuvastatin (CRESTOR) 20 MG tablet Take 1 tablet (20 mg total) by mouth daily.  90 tablet  3   No current facility-administered medications on file prior to visit.    Current Medications (verified) Current Outpatient Prescriptions  Medication Sig Dispense Refill  . clotrimazole-betamethasone (LOTRISONE) cream Apply topically 2 (two) times daily.  30 g  0  . fenofibrate 160 MG tablet Take 1 tablet (160 mg total) by mouth daily.  90 tablet  3  . levothyroxine (SYNTHROID, LEVOTHROID) 112 MCG tablet Take 1 tablet (112 mcg total) by mouth daily.  90 tablet  0  . Multiple Vitamin (MULTIVITAMIN) tablet Take 1 tablet by mouth daily.      . rosuvastatin (CRESTOR) 20 MG tablet Take 1 tablet (20 mg total) by mouth daily.  90 tablet  3    No current facility-administered medications for this visit.     Allergies (verified) Review of patient's allergies indicates no known allergies.   PAST HISTORY  Family History Family History  Problem Relation Age of Onset  . Alzheimer's disease Mother   . Cancer Father     lung    Social History History  Substance Use Topics  . Smoking status: Former Games developer  . Smokeless tobacco: Not on file  . Alcohol Use: No     Are there smokers in your home (other than you)? No  Risk Factors Current exercise habits: The patient does not participate in regular exercise at present.  Dietary issues discussed: na   Cardiac risk factors: advanced age (older than 38 for men, 51 for women), dyslipidemia and sedentary lifestyle.  Depression Screen (Note: if answer to either of the following is "Yes", a more complete depression screening is indicated)   Over the past two weeks, have you felt down, depressed or hopeless? No  Over the past two weeks, have you felt little interest or pleasure in doing things? No  Have you lost interest or pleasure in daily life? No  Do you often feel hopeless? No  Do you cry easily over simple problems? No  Activities of Daily Living In your present state of health, do you have any difficulty performing the following activities?:  Driving? Yes Managing money?  No Feeding yourself? No Getting from bed to chair? No Climbing a flight of stairs? No Preparing food and eating?: No Bathing or showering? No Getting dressed: No Getting to the toilet? No Using the toilet:No Moving around from place to place: No In the past year have you fallen or had a near fall?:No   Are you sexually active?  No  Do you have more than one partner?  No  Hearing Difficulties: No Do you often ask people to speak up or repeat themselves? No Do you experience ringing or noises in your ears? No Do you have difficulty understanding soft or whispered voices? No   Do you feel  that you have a problem with memory? No  Do you often misplace items? No  Do you feel safe at home?  Yes  Cognitive Testing  Alert? Yes  Normal Appearance?Yes  Oriented to person? Yes  Place? Yes   Time? Yes  Recall of three objects?  Yes  Can perform simple calculations? Yes  Displays appropriate judgment?Yes  Can read the correct time from a watch face?Yes   Advanced Directives have been discussed with the patient? Yes  List the Names of Other Physician/Practitioners you currently use: 1.  oph--friendly  Indicate any recent Medical Services you may have received from other than [facility] providers in the past year (date may be approximate).  Immunization History  Administered Date(s) Administered  . Influenza Split 03/15/2011  . Influenza Whole 03/21/2007, 02/04/2008, 02/25/2009, 03/11/2010, 02/07/2012  . Pneumococcal Polysaccharide 05/15/2006, 12/11/2007  . Td 02/24/2004  . Zoster 11/02/2009    Screening Tests Health Maintenance  Topic Date Due  . Influenza Vaccine  01/07/2013  . Mammogram  06/18/2013  . Tetanus/tdap  02/23/2014  . Colonoscopy  01/15/2018  . Pneumococcal Polysaccharide Vaccine Age 36 And Over  Completed  . Zostavax  Completed    All answers were reviewed with the patient and necessary referrals were made:  Loreen Freud, DO   07/20/2012   History reviewed:  She  has a past medical history of Thyroid disease; Hyperlipidemia; COPD (chronic obstructive pulmonary disease); and Osteopenia. She  does not have any pertinent problems on file. She  has past surgical history that includes colon polyps. Her family history includes Alzheimer's disease in her mother and Cancer in her father. She  reports that she has quit smoking. She does not have any smokeless tobacco history on file. She reports that she does not drink alcohol or use illicit drugs. She has a current medication list which includes the following prescription(s): clotrimazole-betamethasone,  fenofibrate, levothyroxine, multivitamin, and rosuvastatin. Current Outpatient Prescriptions on File Prior to Visit  Medication Sig Dispense Refill  . clotrimazole-betamethasone (LOTRISONE) cream Apply topically 2 (two) times daily.  30 g  0  . fenofibrate 160 MG tablet Take 1 tablet (160 mg total) by mouth daily.  90 tablet  3  . levothyroxine (SYNTHROID, LEVOTHROID) 112 MCG tablet Take 1 tablet (112 mcg total) by mouth daily.  90 tablet  0  . rosuvastatin (CRESTOR) 20 MG tablet Take 1 tablet (20 mg total) by mouth daily.  90 tablet  3   No current facility-administered medications on file prior to visit.   She has No Known Allergies.  Review of Systems Review of Systems  Constitutional: Negative for activity change, appetite change and fatigue.  HENT: Negative for hearing loss, congestion, tinnitus and ear discharge.  dentist--no Eyes: Negative for visual disturbance (see optho q1y -- vision corrected  to 20/20 with glasses).  Respiratory: Negative for cough, chest tightness and shortness of breath.   Cardiovascular: Negative for chest pain, palpitations and leg swelling.  Gastrointestinal: Negative for abdominal pain, diarrhea, constipation and abdominal distention.  Genitourinary: Negative for urgency, frequency, decreased urine volume and difficulty urinating.  Musculoskeletal: Negative for back pain, arthralgias and gait problem.  Skin: Negative for color change, pallor and rash.  Neurological: Negative for dizziness, light-headedness, numbness and headaches.  Hematological: Negative for adenopathy. Does not bruise/bleed easily.  Psychiatric/Behavioral: Negative for suicidal ideas, confusion, sleep disturbance, self-injury, dysphoric mood, decreased concentration and agitation.        Objective:     Vision by Snellen chart: opth Body mass index is 27.72 kg/(m^2). BP 122/80  Pulse 66  Temp(Src) 98.6 F (37 C) (Oral)  Ht 5\' 5"  (1.651 m)  Wt 166 lb 9.6 oz (75.569 kg)  BMI  27.72 kg/m2  SpO2 98%  BP 122/80  Pulse 66  Temp(Src) 98.6 F (37 C) (Oral)  Ht 5\' 5"  (1.651 m)  Wt 166 lb 9.6 oz (75.569 kg)  BMI 27.72 kg/m2  SpO2 98% General appearance: alert, cooperative, appears stated age and no distress Head: Normocephalic, without obvious abnormality, atraumatic Eyes: negative findings: lids and lashes normal, conjunctivae and sclerae normal and pupils equal, round, reactive to light and accomodation Ears: normal TM's and external ear canals both ears Nose: Nares normal. Septum midline. Mucosa normal. No drainage or sinus tenderness. Throat: lips, mucosa, and tongue normal; teeth and gums normal Neck: no adenopathy, no carotid bruit, no JVD, supple, symmetrical, trachea midline and thyroid not enlarged, symmetric, no tenderness/mass/nodules Back: symmetric, no curvature. ROM normal. No CVA tenderness. Lungs: clear to auscultation bilaterally Breasts: normal appearance, no masses or tenderness Heart: regular rate and rhythm, S1, S2 normal, no murmur, click, rub or gallop Abdomen: soft, non-tender; bowel sounds normal; no masses,  no organomegaly Pelvic: deferred and not indicated; post-menopausal, no abnormal Pap smears in past Extremities: extremities normal, atraumatic, no cyanosis or edema Pulses: 2+ and symmetric Skin: Skin color, texture, turgor normal. No rashes or lesions or + dry itchy skin upper back Lymph nodes: Cervical, supraclavicular, and axillary nodes normal. Neurologic: Alert and oriented X 3, normal strength and tone. Normal symmetric reflexes. Normal coordination and gait Psych---no depression, no anxiey      Assessment:     cpe     Plan:     During the course of the visit the patient was educated and counseled about appropriate screening and preventive services including:    Pneumococcal vaccine   Influenza vaccine  Td vaccine  Screening mammography  Screening Pap smear and pelvic exam   Bone densitometry  screening  Colorectal cancer screening  Diabetes screening  Glaucoma screening  Advanced directives: has NO advanced directive - not interested in additional information  Diet review for nutrition referral? Yes ____  Not Indicated _x___   Patient Instructions (the written plan) was given to the patient.  Medicare Attestation I have personally reviewed: The patient's medical and social history Their use of alcohol, tobacco or illicit drugs Their current medications and supplements The patient's functional ability including ADLs,fall risks, home safety risks, cognitive, and hearing and visual impairment Diet and physical activities Evidence for depression or mood disorders  The patient's weight, height, BMI, and visual acuity have been recorded in the chart.  I have made referrals, counseling, and provided education to the patient based on review of the above and I have provided the patient  with a written personalized care plan for preventive services.     Loreen Freud, DO   07/20/2012

## 2012-07-31 ENCOUNTER — Other Ambulatory Visit: Payer: Self-pay | Admitting: Family Medicine

## 2012-08-28 ENCOUNTER — Other Ambulatory Visit: Payer: Self-pay | Admitting: Family Medicine

## 2012-09-24 ENCOUNTER — Telehealth: Payer: Self-pay | Admitting: *Deleted

## 2012-09-24 MED ORDER — ALPRAZOLAM 0.5 MG PO TABS: 0.5000 mg | ORAL_TABLET | Freq: Three times a day (TID) | ORAL | Status: DC | PRN

## 2012-09-24 NOTE — Telephone Encounter (Signed)
Pt called requesting a mild nerve pill.   Pt stated unable to come in for OV b/c of sick husband and unable to leave him.   Last wellness visit was (07-20-12).  Pt would like something called into [facility].  Please advise.//AB/CMA

## 2012-09-24 NOTE — Telephone Encounter (Signed)
Xanax 0.5 mg 1 po tid prn #30   

## 2012-09-24 NOTE — Telephone Encounter (Signed)
Spoke with the pt and informed her that Dr. Dewayne Shorter something for her nerves.  Pt agreed.  New rx printed and sent to the pharmacy.//AB/CMA

## 2012-10-11 ENCOUNTER — Encounter: Payer: Self-pay | Admitting: Lab

## 2012-10-12 ENCOUNTER — Ambulatory Visit (INDEPENDENT_AMBULATORY_CARE_PROVIDER_SITE_OTHER): Payer: Medicare Other | Admitting: Family Medicine

## 2012-10-12 ENCOUNTER — Encounter: Payer: Self-pay | Admitting: Family Medicine

## 2012-10-12 ENCOUNTER — Ambulatory Visit (HOSPITAL_BASED_OUTPATIENT_CLINIC_OR_DEPARTMENT_OTHER)
Admission: RE | Admit: 2012-10-12 | Discharge: 2012-10-12 | Disposition: A | Discharge status: Home / Self Care | Payer: Medicare Other | Source: Ambulatory Visit | Attending: Family Medicine | Admitting: Family Medicine

## 2012-10-12 VITALS — BP 134/80 | HR 74 | Temp 98.4°F | Wt 162.0 lb

## 2012-10-12 DIAGNOSIS — M1120 Other chondrocalcinosis, unspecified site: Secondary | ICD-10-CM | POA: Insufficient documentation

## 2012-10-12 DIAGNOSIS — M25562 Pain in left knee: Secondary | ICD-10-CM

## 2012-10-12 DIAGNOSIS — M25469 Effusion, unspecified knee: Secondary | ICD-10-CM | POA: Insufficient documentation

## 2012-10-12 DIAGNOSIS — M25569 Pain in unspecified knee: Secondary | ICD-10-CM | POA: Insufficient documentation

## 2012-10-12 NOTE — Patient Instructions (Signed)
Knee Pain  The knee is the complex joint between your thigh and your lower leg. It is made up of bones, tendons, ligaments, and cartilage. The bones that make up the knee are:   The femur in the thigh.   The tibia and fibula in the lower leg.   The patella or kneecap riding in the groove on the lower femur.  CAUSES   Knee pain is a common complaint with many causes. A few of these causes are:   Injury, such as:   A ruptured ligament or tendon injury.   Torn cartilage.   Medical conditions, such as:   Gout   Arthritis   Infections   Overuse, over training or overdoing a physical activity.  Knee pain can be minor or severe. Knee pain can accompany debilitating injury. Minor knee problems often respond well to self-care measures or get well on their own. More serious injuries may need medical intervention or even surgery.  SYMPTOMS  The knee is complex. Symptoms of knee problems can vary widely. Some of the problems are:   Pain with movement and weight bearing.   Swelling and tenderness.   Buckling of the knee.   Inability to straighten or extend your knee.   Your knee locks and you cannot straighten it.   Warmth and redness with pain and fever.   Deformity or dislocation of the kneecap.  DIAGNOSIS   Determining what is wrong may be very straight forward such as when there is an injury. It can also be challenging because of the complexity of the knee. Tests to make a diagnosis may include:   Your caregiver taking a history and doing a physical exam.   Routine X-rays can be used to rule out other problems. X-rays will not reveal a cartilage tear. Some injuries of the knee can be diagnosed by:   Arthroscopy a surgical technique by which a small video camera is inserted through tiny incisions on the sides of the knee. This procedure is used to examine and repair internal knee joint problems. Tiny instruments can be used during arthroscopy to repair the torn knee cartilage (meniscus).   Arthrography  is a radiology technique. A contrast liquid is directly injected into the knee joint. Internal structures of the knee joint then become visible on X-ray film.   An MRI scan is a non x-ray radiology procedure in which magnetic fields and a computer produce two- or three-dimensional images of the inside of the knee. Cartilage tears are often visible using an MRI scanner. MRI scans have largely replaced arthrography in diagnosing cartilage tears of the knee.   Blood work.   Examination of the fluid that helps to lubricate the knee joint (synovial fluid). This is done by taking a sample out using a needle and a syringe.  TREATMENT  The treatment of knee problems depends on the cause. Some of these treatments are:   Depending on the injury, proper casting, splinting, surgery or physical therapy care will be needed.   Give yourself adequate recovery time. Do not overuse your joints. If you begin to get sore during workout routines, back off. Slow down or do fewer repetitions.   For repetitive activities such as cycling or running, maintain your strength and nutrition.   Alternate muscle groups. For example if you are a weight lifter, work the upper body on one day and the lower body the next.   Either tight or weak muscles do not give the proper support for your   knee. Tight or weak muscles do not absorb the stress placed on the knee joint. Keep the muscles surrounding the knee strong.   Take care of mechanical problems.   If you have flat feet, orthotics or special shoes may help. See your caregiver if you need help.   Arch supports, sometimes with wedges on the inner or outer aspect of the heel, can help. These can shift pressure away from the side of the knee most bothered by osteoarthritis.   A brace called an "unloader" brace also may be used to help ease the pressure on the most arthritic side of the knee.   If your caregiver has prescribed crutches, braces, wraps or ice, use as directed. The acronym for  this is PRICE. This means protection, rest, ice, compression and elevation.   Nonsteroidal anti-inflammatory drugs (NSAID's), can help relieve pain. But if taken immediately after an injury, they may actually increase swelling. Take NSAID's with food in your stomach. Stop them if you develop stomach problems. Do not take these if you have a history of ulcers, stomach pain or bleeding from the bowel. Do not take without your caregiver's approval if you have problems with fluid retention, heart failure, or kidney problems.   For ongoing knee problems, physical therapy may be helpful.   Glucosamine and chondroitin are over-the-counter dietary supplements. Both may help relieve the pain of osteoarthritis in the knee. These medicines are different from the usual anti-inflammatory drugs. Glucosamine may decrease the rate of cartilage destruction.   Injections of a corticosteroid drug into your knee joint may help reduce the symptoms of an arthritis flare-up. They may provide pain relief that lasts a few months. You may have to wait a few months between injections. The injections do have a small increased risk of infection, water retention and elevated blood sugar levels.   Hyaluronic acid injected into damaged joints may ease pain and provide lubrication. These injections may work by reducing inflammation. A series of shots may give relief for as long as 6 months.   Topical painkillers. Applying certain ointments to your skin may help relieve the pain and stiffness of osteoarthritis. Ask your pharmacist for suggestions. Many over the-counter products are approved for temporary relief of arthritis pain.   In some countries, doctors often prescribe topical NSAID's for relief of chronic conditions such as arthritis and tendinitis. A review of treatment with NSAID creams found that they worked as well as oral medications but without the serious side effects.  PREVENTION   Maintain a healthy weight. Extra pounds put  more strain on your joints.   Get strong, stay limber. Weak muscles are a common cause of knee injuries. Stretching is important. Include flexibility exercises in your workouts.   Be smart about exercise. If you have osteoarthritis, chronic knee pain or recurring injuries, you may need to change the way you exercise. This does not mean you have to stop being active. If your knees ache after jogging or playing basketball, consider switching to swimming, water aerobics or other low-impact activities, at least for a few days a week. Sometimes limiting high-impact activities will provide relief.   Make sure your shoes fit well. Choose footwear that is right for your sport.   Protect your knees. Use the proper gear for knee-sensitive activities. Use kneepads when playing volleyball or laying carpet. Buckle your seat belt every time you drive. Most shattered kneecaps occur in car accidents.   Rest when you are tired.  SEEK MEDICAL CARE IF:     You have knee pain that is continual and does not seem to be getting better.   SEEK IMMEDIATE MEDICAL CARE IF:   Your knee joint feels hot to the touch and you have a high fever.  MAKE SURE YOU:    Understand these instructions.   Will watch your condition.   Will get help right away if you are not doing well or get worse.  Document Released: 02/20/2007 Document Revised: 07/18/2011 Document Reviewed: 02/20/2007  [employer] Patient Information 2014 [employer].

## 2012-10-12 NOTE — Progress Notes (Signed)
  Subjective:    Carrie Hall is a 79 y.o. female who presents with knee swelling involving the right knee. Onset was sudden, not related to any specific activity. Inciting event: none known. Current symptoms include: crepitus sensation and popping sensation. Pain is aggravated by any weight bearing and rising after sitting. Patient has had no prior knee problems. Evaluation to date: none. Treatment to date: none.  The following portions of the patient's history were reviewed and updated as appropriate: allergies, current medications, past family history, past medical history, past social history, past surgical history and problem list.   Review of Systems Pertinent items are noted in HPI.   Objective:    BP 134/80  Pulse 74  Temp(Src) 98.4 F (36.9 C) (Oral)  Wt 162 lb (73.483 kg)  BMI 26.96 kg/m2  SpO2 96% Right knee: positive exam findings: effusion, crepitus and tenderness noted anterior  Left knee:  normal and no effusion, full active range of motion, no joint line tenderness, ligamentous structures intact.   X-ray right knee: ordered, but results not yet available    Assessment:    Right Mild osteoarthritis on the right -- with mild effusions   Plan:    Educational materials distributed. Rest, ice, compression, and elevation (RICE) therapy. Patellar compression sleeve. OTC analgesics as needed. Orthopedics referral. Plain film x-rays.

## 2012-10-24 ENCOUNTER — Other Ambulatory Visit: Payer: Self-pay | Admitting: Family Medicine

## 2012-10-25 NOTE — Telephone Encounter (Signed)
Last seen 10/12/12 and filled 09/24/12 #30. Please advise    KP

## 2012-11-23 ENCOUNTER — Other Ambulatory Visit: Payer: Self-pay | Admitting: Family Medicine

## 2012-11-23 NOTE — Telephone Encounter (Signed)
Last seen 10/12/12 and filled 10/24/12 #30. Please advise      KP

## 2013-01-17 ENCOUNTER — Encounter: Payer: Self-pay | Admitting: Internal Medicine

## 2013-02-14 ENCOUNTER — Ambulatory Visit (INDEPENDENT_AMBULATORY_CARE_PROVIDER_SITE_OTHER): Payer: Medicare Other

## 2013-02-14 DIAGNOSIS — Z23 Encounter for immunization: Secondary | ICD-10-CM

## 2013-02-18 ENCOUNTER — Other Ambulatory Visit: Payer: Self-pay | Admitting: Family Medicine

## 2013-02-20 NOTE — Telephone Encounter (Signed)
Last seen 10/12/12 and filled 11/23/12 #30. Please advise     KP

## 2013-03-28 ENCOUNTER — Emergency Department (HOSPITAL_BASED_OUTPATIENT_CLINIC_OR_DEPARTMENT_OTHER)
Admission: EM | Admit: 2013-03-28 | Discharge: 2013-03-28 | Disposition: A | Discharge status: Home / Self Care | Payer: Medicare Other | Attending: Emergency Medicine | Admitting: Emergency Medicine

## 2013-03-28 ENCOUNTER — Encounter (HOSPITAL_BASED_OUTPATIENT_CLINIC_OR_DEPARTMENT_OTHER): Payer: Self-pay | Admitting: Emergency Medicine

## 2013-03-28 ENCOUNTER — Emergency Department (HOSPITAL_BASED_OUTPATIENT_CLINIC_OR_DEPARTMENT_OTHER): Payer: Medicare Other

## 2013-03-28 DIAGNOSIS — S300XXA Contusion of lower back and pelvis, initial encounter: Secondary | ICD-10-CM | POA: Insufficient documentation

## 2013-03-28 DIAGNOSIS — Y9389 Activity, other specified: Secondary | ICD-10-CM | POA: Insufficient documentation

## 2013-03-28 DIAGNOSIS — Z853 Personal history of malignant neoplasm of breast: Secondary | ICD-10-CM | POA: Insufficient documentation

## 2013-03-28 DIAGNOSIS — Z8739 Personal history of other diseases of the musculoskeletal system and connective tissue: Secondary | ICD-10-CM | POA: Insufficient documentation

## 2013-03-28 DIAGNOSIS — Y929 Unspecified place or not applicable: Secondary | ICD-10-CM | POA: Insufficient documentation

## 2013-03-28 DIAGNOSIS — J4489 Other specified chronic obstructive pulmonary disease: Secondary | ICD-10-CM | POA: Insufficient documentation

## 2013-03-28 DIAGNOSIS — J449 Chronic obstructive pulmonary disease, unspecified: Secondary | ICD-10-CM | POA: Insufficient documentation

## 2013-03-28 DIAGNOSIS — E079 Disorder of thyroid, unspecified: Secondary | ICD-10-CM | POA: Insufficient documentation

## 2013-03-28 DIAGNOSIS — E785 Hyperlipidemia, unspecified: Secondary | ICD-10-CM | POA: Insufficient documentation

## 2013-03-28 DIAGNOSIS — Z79899 Other long term (current) drug therapy: Secondary | ICD-10-CM | POA: Insufficient documentation

## 2013-03-28 DIAGNOSIS — Z87891 Personal history of nicotine dependence: Secondary | ICD-10-CM | POA: Insufficient documentation

## 2013-03-28 DIAGNOSIS — W010XXA Fall on same level from slipping, tripping and stumbling without subsequent striking against object, initial encounter: Secondary | ICD-10-CM | POA: Insufficient documentation

## 2013-03-28 HISTORY — DX: Malignant neoplasm of unspecified site of unspecified female breast: C50.919

## 2013-03-28 NOTE — ED Provider Notes (Addendum)
I saw and evaluated the patient, reviewed the resident's note and I agree with the findings and plan.   .Face to face Exam:  General:  Awake HEENT:  Atraumatic Resp:  Normal effort Abd:  Nondistended Neuro:No focal weakness  Nelia Shi, MD 03/28/13 1046  I saw and reviewed the EKG interpretation of the resident and agree with the findings.    Nelia Shi, MD 04/12/13 2141

## 2013-03-28 NOTE — ED Notes (Signed)
Patient transported to X-ray 

## 2013-03-28 NOTE — Discharge Instructions (Signed)
Contusion A contusion is a deep bruise. Contusions are the result of an injury that caused bleeding under the skin. The contusion may turn blue, purple, or yellow. Minor injuries will give you a painless contusion, but more severe contusions may stay painful and swollen for a few weeks.  CAUSES  A contusion is usually caused by a blow, trauma, or direct force to an area of the body. SYMPTOMS   Swelling and redness of the injured area.  Bruising of the injured area.  Tenderness and soreness of the injured area.  Pain. DIAGNOSIS  The diagnosis can be made by taking a history and physical exam. An X-ray, CT scan, or MRI may be needed to determine if there were any associated injuries, such as fractures. TREATMENT  Specific treatment will depend on what area of the body was injured. In general, the best treatment for a contusion is resting, icing, elevating, and applying cold compresses to the injured area. Over-the-counter medicines may also be recommended for pain control. Ask your caregiver what the best treatment is for your contusion. HOME CARE INSTRUCTIONS   Put ice on the injured area.  Put ice in a plastic bag.  Place a towel between your skin and the bag.  Leave the ice on for 15-20 minutes, 03-04 times a day.  Only take over-the-counter or prescription medicines for pain, discomfort, or fever as directed by your caregiver. Your caregiver may recommend avoiding anti-inflammatory medicines (aspirin, ibuprofen, and naproxen) for 48 hours because these medicines may increase bruising.  Rest the injured area.  If possible, elevate the injured area to reduce swelling. SEEK IMMEDIATE MEDICAL CARE IF:   You have increased bruising or swelling.  You have pain that is getting worse.  Your swelling or pain is not relieved with medicines. MAKE SURE YOU:   Understand these instructions.  Will watch your condition.  Will get help right away if you are not doing well or get  worse. Document Released: 02/02/2005 Document Revised: 07/18/2011 Document Reviewed: 02/28/2011 [facility] Patient Information 2014 [place], [place].  Bone Bruise  A bone bruise is a small hidden fracture of the bone. It typically occurs with bones located close to the surface of the skin.  SYMPTOMS  The pain lasts longer than a normal bruise.  The bruised area is difficult to use.  There may be discoloration or swelling of the bruised area.  When a bone bruise is found with injury to the anterior cruciate ligament (in the knee) there is often an increased:  Amount of fluid in the knee  Time the fluid in the knee lasts.  Number of days until you are walking normally and regaining the motion you had before the injury.  Number of days with pain from the injury. DIAGNOSIS  It can only be seen on X-rays known as MRIs. This stands for magnetic resonance imaging. A regular X-ray taken of a bone bruise would appear to be normal. A bone bruise is a common injury in the knee and the heel bone (calcaneus). The problems are similar to those produced by stress fractures, which are bone injuries caused by overuse. A bone bruise may also be a sign of other injuries. For example, bone bruises are commonly found where an anterior cruciate ligament (ACL) in the knee has been pulled away from the bone (ruptured). A ligament is a tough fibrous material that connects bones together to make our joints stable. Bruises of the bone last a lot longer than bruises of the muscle  or tissues beneath the skin. Bone bruises can last from days to months and are often more severe and painful than other bruises. TREATMENT Because bone bruises are sudden injuries you cannot often prevent them, other than by being extremely careful. Some things you can do to improve the condition are:  Apply ice to the sore area for 15-20 minutes, 03-04 times per day while awake for the first 2 days. Put the ice in a plastic bag, and place a  towel between the bag of ice and your skin.  Keep your bruised area raised (elevated) when possible to lessen swelling.  For activity:  Use crutches when necessary; do not put weight on the injured leg until you are no longer tender.  You may walk on your affected part as the pain allows, or as instructed.  Start weight bearing gradually on the bruised part.  Continue to use crutches or a cane until you can stand without causing pain, or as instructed.  If a plaster splint was applied, wear the splint until you are seen for a follow-up examination. Rest it on nothing harder than a pillow the first 24 hours. Do not put weight on it. Do not get it wet. You may take it off to take a shower or bath.  If an air splint was applied, more air may be blown into or out of the splint as needed for comfort. You may take it off at night and to take a shower or bath.  Wiggle your toes in the splint several times per day if you are able.  You may have been given an elastic bandage to use with the plaster splint or alone. The splint is too tight if you have numbness, tingling or if your foot becomes cold and blue. Adjust the bandage to make it comfortable.  Only take over-the-counter or prescription medicines for pain, discomfort, or fever as directed by your caregiver.  Follow all instructions for follow up with your caregiver. This includes any orthopedic referrals, physical therapy, and rehabilitation. Any delay in obtaining necessary care could result in a delay or failure of the bones to heal. SEEK MEDICAL CARE IF:   You have an increase in bruising, swelling, or pain.  You notice coldness of your toes.  You do not get pain relief with medications. SEEK IMMEDIATE MEDICAL CARE IF:   Your toes are numb or blue.  You have severe pain not controlled with medications.  If any of the problems that caused you to seek care are becoming worse. Document Released: 07/16/2003 Document Revised:  07/18/2011 Document Reviewed: 12/05/2012 [facility] Patient Information 2014 [place], [place].

## 2013-03-28 NOTE — ED Notes (Signed)
Pt reports her feet "slipped out from under me" while ambulating.  States she fell injuring "tailbone".

## 2013-03-28 NOTE — ED Provider Notes (Signed)
CSN: [id number]     Arrival date & time 03/28/13  0908 History   First MD Initiated Contact with Patient 03/28/13 0945     Chief Complaint  Patient presents with  . Fall  . Tailbone Pain   (Consider location/radiation/quality/duration/timing/severity/associated sxs/prior Treatment) Patient is a 79 y.o. female presenting with fall. The history is provided by the patient.  Fall This is a new problem. The current episode started today. The problem occurs rarely. The problem has been gradually improving. Pertinent negatives include no abdominal pain, anorexia, arthralgias, change in bowel habit, chest pain, chills, coughing, diaphoresis, fatigue, fever, headaches, joint swelling, neck pain, numbness, rash, sore throat, swollen glands, urinary symptoms, vertigo, vomiting or weakness. The symptoms are aggravated by bending, exertion, twisting and walking. She has tried nothing for the symptoms. The treatment provided no relief.   Patient reports a mechanical fall when she slipped on hardwood floors. She landed on her tailbone. She did not hit her head. She denies neck pain. She denies other injury.   Past Medical History  Diagnosis Date  . Thyroid disease   . Hyperlipidemia   . COPD (chronic obstructive pulmonary disease)   . Osteopenia   . Breast cancer    Past Surgical History  Procedure Laterality Date  . Colon polyps    . Breast surgery     Family History  Problem Relation Age of Onset  . Alzheimer's disease Mother   . Cancer Father     lung   History  Substance Use Topics  . Smoking status: Former Games developer  . Smokeless tobacco: Not on file  . Alcohol Use: No   OB History   Grav Para Term Preterm Abortions TAB SAB Ect Mult Living                 Review of Systems  Constitutional: Negative for fever, chills, diaphoresis and fatigue.  HENT: Negative for sore throat.   Respiratory: Negative for cough.   Cardiovascular: Negative for chest pain.  Gastrointestinal: Negative  for vomiting, abdominal pain, anorexia and change in bowel habit.  Musculoskeletal: Negative for arthralgias, joint swelling and neck pain.  Skin: Negative for rash.  Neurological: Negative for vertigo, weakness, numbness and headaches.    Allergies  Review of patient's allergies indicates no known allergies.  Home Medications   Current Outpatient Rx  Name  Route  Sig  Dispense  Refill  . ALPRAZolam (XANAX) 0.5 MG tablet      TAKE ONE TABLET BY MOUTH THREE TIMES DAILY AS NEEDED   30 tablet   0   . CRESTOR 20 MG tablet      TAKE ONE TABLET BY MOUTH EVERY DAY   90 tablet   1   . levothyroxine (SYNTHROID, LEVOTHROID) 112 MCG tablet      TAKE ONE TABLET BY MOUTH EVERY DAY   90 tablet   3    BP 146/92  Pulse 62  Temp(Src) 97.6 F (36.4 C) (Oral)  Resp 18  Ht 5\' 4"  (1.626 m)  Wt 140 lb (63.504 kg)  BMI 24.02 kg/m2  SpO2 98% Physical Exam  Constitutional: She appears well-developed and well-nourished. No distress.  HENT:  Head: Normocephalic and atraumatic.  Mouth/Throat: Oropharynx is clear and moist. No oropharyngeal exudate.  Eyes: EOM are normal. Pupils are equal, round, and reactive to light.  Musculoskeletal:  5/5 strength in LE. Full ROM of motion of hip and knee. Tender to palpation above coccyx.  Skin: She is not diaphoretic.  ED Course  Procedures (including critical care time) Labs Review Labs Reviewed - No data to display Imaging Review Dg Sacrum/coccyx  03/28/2013   CLINICAL DATA:  Status post fall.  Sacral pain.  EXAM: SACRUM AND COCCYX - 2+ VIEW  COMPARISON:  None.  FINDINGS: There is no evidence of fracture or other focal bone lesion.  IMPRESSION: Negative exam.   Electronically Signed   By: Drusilla Kanner M.D.   On: 03/28/2013 10:03    EKG Interpretation    Date/Time:  Thursday March 28 2013 09:25:14 EST Ventricular Rate:  64 PR Interval:  196 QRS Duration: 98 QT Interval:  422 QTC Calculation: 435 R Axis:   50 Text  Interpretation:  Sinus rhythm with sinus arrhythmia with occasional Premature ventricular complexes Nonspecific T wave abnormality Abnormal ECG Confirmed by BEATON  MD, ROBERT ([id number]) on 03/28/2013 9:34:02 AM            MDM   1. Contusion of sacrum, initial encounter     1. Contusion The patients coccyx pain is likely due to contusion 2/2 to mechanical fall. The patient denies pre-syncope or syncopal symptoms. No lightheadedness, dizziness, palpations, or LOC. She is able ambulate with minimal pain. Xray of sacrum and coccyx demonstrated no fracture. I recommended tylenol as needed for pain and f/u with PCP. Patient instructed to return for new or worsening symptoms. Patient agreed with this plan.     Pleas Koch, MD 03/28/13 1037

## 2013-05-15 ENCOUNTER — Other Ambulatory Visit: Payer: Self-pay | Admitting: Family Medicine

## 2013-08-01 ENCOUNTER — Encounter: Payer: Self-pay | Admitting: Family Medicine

## 2013-08-06 ENCOUNTER — Encounter: Payer: Self-pay | Admitting: Family Medicine

## 2013-08-27 ENCOUNTER — Encounter: Payer: Self-pay | Admitting: Internal Medicine

## 2013-09-17 ENCOUNTER — Other Ambulatory Visit: Payer: Self-pay | Admitting: Family Medicine

## 2013-10-21 ENCOUNTER — Other Ambulatory Visit: Payer: Self-pay | Admitting: Family Medicine

## 2013-10-30 ENCOUNTER — Other Ambulatory Visit: Payer: Self-pay | Admitting: Family Medicine

## 2013-11-30 ENCOUNTER — Other Ambulatory Visit: Payer: Self-pay | Admitting: Family Medicine

## 2013-12-31 ENCOUNTER — Other Ambulatory Visit: Payer: Self-pay | Admitting: Family Medicine

## 2014-01-08 ENCOUNTER — Telehealth: Payer: Self-pay | Admitting: Family Medicine

## 2014-01-08 NOTE — Telephone Encounter (Signed)
Caller name: Jing  Relation to pt: self  Call back number: [phone]   Reason for call:   pt scheduled CPE 03/07/2014. Pt would like to come in before then for lab work regarding her blood sugar

## 2014-01-09 MED ORDER — ROSUVASTATIN CALCIUM 20 MG PO TABS: ORAL_TABLET | ORAL | Status: DC

## 2014-01-09 NOTE — Telephone Encounter (Signed)
The insurance company may not pay for it.     KP

## 2014-01-09 NOTE — Telephone Encounter (Signed)
Caller name: Trinty  Relation to pt: self  Call back number: [phone] Pharmacy: [facility] [phone]   Reason for call:   requesting a refill of CRESTOR 20 MG tablet.

## 2014-01-14 ENCOUNTER — Ambulatory Visit: Payer: Medicare Other | Admitting: Family Medicine

## 2014-01-27 ENCOUNTER — Telehealth: Payer: Self-pay

## 2014-01-27 MED ORDER — ALPRAZOLAM 0.5 MG PO TABS: ORAL_TABLET | ORAL | Status: DC

## 2014-01-27 NOTE — Telephone Encounter (Signed)
Carrie Hall Self [phone]  Greenlee called to see if she could get a refill ALPRAZolam (XANAX) 0.5 MG tablet, it helps her to relax and sleep

## 2014-01-27 NOTE — Telephone Encounter (Signed)
Refill x1---- needs ov

## 2014-01-27 NOTE — Telephone Encounter (Signed)
Last seen 10/12/12 and filled 10/13.14 #30. Please advise     KP

## 2014-02-05 ENCOUNTER — Encounter: Payer: Self-pay | Admitting: Internal Medicine

## 2014-03-07 ENCOUNTER — Ambulatory Visit (INDEPENDENT_AMBULATORY_CARE_PROVIDER_SITE_OTHER): Payer: Medicare Other | Admitting: Family Medicine

## 2014-03-07 ENCOUNTER — Encounter: Payer: Self-pay | Admitting: Family Medicine

## 2014-03-07 VITALS — BP 112/70 | HR 63 | Temp 98.1°F | Ht 65.0 in | Wt 162.2 lb

## 2014-03-07 DIAGNOSIS — R8290 Unspecified abnormal findings in urine: Secondary | ICD-10-CM

## 2014-03-07 DIAGNOSIS — Z0000 Encounter for general adult medical examination without abnormal findings: Secondary | ICD-10-CM

## 2014-03-07 DIAGNOSIS — Z1239 Encounter for other screening for malignant neoplasm of breast: Secondary | ICD-10-CM

## 2014-03-07 DIAGNOSIS — E038 Other specified hypothyroidism: Secondary | ICD-10-CM

## 2014-03-07 DIAGNOSIS — Z23 Encounter for immunization: Secondary | ICD-10-CM

## 2014-03-07 DIAGNOSIS — E785 Hyperlipidemia, unspecified: Secondary | ICD-10-CM

## 2014-03-07 LAB — CBC WITH DIFFERENTIAL/PLATELET
Basophils Absolute: 0 10*3/uL (ref 0.0–0.1)
Basophils Relative: 0.5 % (ref 0.0–3.0)
EOS PCT: 1.2 % (ref 0.0–5.0)
Eosinophils Absolute: 0.1 10*3/uL (ref 0.0–0.7)
HEMATOCRIT: 43.5 % (ref 36.0–46.0)
Hemoglobin: 14.6 g/dL (ref 12.0–15.0)
Lymphocytes Relative: 22.3 % (ref 12.0–46.0)
Lymphs Abs: 2 10*3/uL (ref 0.7–4.0)
MCHC: 33.5 g/dL (ref 30.0–36.0)
MCV: 86.8 fl (ref 78.0–100.0)
Monocytes Absolute: 0.6 10*3/uL (ref 0.1–1.0)
Monocytes Relative: 7 % (ref 3.0–12.0)
NEUTROS ABS: 6 10*3/uL (ref 1.4–7.7)
Neutrophils Relative %: 69 % (ref 43.0–77.0)
Platelets: 226 10*3/uL (ref 150.0–400.0)
RBC: 5.01 Mil/uL (ref 3.87–5.11)
RDW: 13.2 % (ref 11.5–15.5)
WBC: 8.8 10*3/uL (ref 4.0–10.5)

## 2014-03-07 LAB — POCT URINALYSIS DIPSTICK
Bilirubin, UA: NEGATIVE
Blood, UA: NEGATIVE
Glucose, UA: NEGATIVE
Ketones, UA: NEGATIVE
NITRITE UA: NEGATIVE
PROTEIN UA: NEGATIVE
Spec Grav, UA: 1.015
UROBILINOGEN UA: 0.2
pH, UA: 6

## 2014-03-07 LAB — LIPID PANEL
CHOL/HDL RATIO: 4
CHOLESTEROL: 188 mg/dL (ref 0–200)
HDL: 43.4 mg/dL (ref >39.00)
NonHDL: 144.6
Triglycerides: 244 mg/dL — ABNORMAL HIGH (ref 0.0–149.0)
VLDL: 48.8 mg/dL — ABNORMAL HIGH (ref 0.0–40.0)

## 2014-03-07 LAB — BASIC METABOLIC PANEL
BUN: 16 mg/dL (ref 6–23)
CO2: 25 mEq/L (ref 19–32)
Calcium: 9.5 mg/dL (ref 8.4–10.5)
Chloride: 102 mEq/L (ref 96–112)
Creatinine, Ser: 0.8 mg/dL (ref 0.4–1.2)
GFR: 75.88 mL/min (ref >60.00)
Glucose, Bld: 102 mg/dL — ABNORMAL HIGH (ref 70–99)
POTASSIUM: 4.2 meq/L (ref 3.5–5.1)
Sodium: 138 mEq/L (ref 135–145)

## 2014-03-07 LAB — HEPATIC FUNCTION PANEL
ALT: 10 U/L (ref 0–35)
AST: 17 U/L (ref 0–37)
Albumin: 3.6 g/dL (ref 3.5–5.2)
Alkaline Phosphatase: 85 U/L (ref 39–117)
BILIRUBIN TOTAL: 0.6 mg/dL (ref 0.2–1.2)
Bilirubin, Direct: 0 mg/dL (ref 0.0–0.3)
Total Protein: 7.3 g/dL (ref 6.0–8.3)

## 2014-03-07 LAB — TSH: TSH: 0.7 u[IU]/mL (ref 0.35–4.50)

## 2014-03-07 LAB — LDL CHOLESTEROL, DIRECT: Direct LDL: 103.3 mg/dL

## 2014-03-07 MED ORDER — ATORVASTATIN CALCIUM 20 MG PO TABS: 20.0000 mg | ORAL_TABLET | Freq: Every day | ORAL | Status: DC

## 2014-03-07 NOTE — Patient Instructions (Signed)
Preventive Care for Adults A healthy lifestyle and preventive care can promote health and wellness. Preventive health guidelines for women include the following key practices.  A routine yearly physical is a good way to check with your health care provider about your health and preventive screening. It is a chance to share any concerns and updates on your health and to receive a thorough exam.  Visit your dentist for a routine exam and preventive care every 6 months. Brush your teeth twice a day and floss once a day. Good oral hygiene prevents tooth decay and gum disease.  The frequency of eye exams is based on your age, health, family medical history, use of contact lenses, and other factors. Follow your health care provider's recommendations for frequency of eye exams.  Eat a healthy diet. Foods like vegetables, fruits, whole grains, low-fat dairy products, and lean protein foods contain the nutrients you need without too many calories. Decrease your intake of foods high in solid fats, added sugars, and salt. Eat the right amount of calories for you.Get information about a proper diet from your health care provider, if necessary.  Regular physical exercise is one of the most important things you can do for your health. Most adults should get at least 150 minutes of moderate-intensity exercise (any activity that increases your heart rate and causes you to sweat) each week. In addition, most adults need muscle-strengthening exercises on 2 or more days a week.  Maintain a healthy weight. The body mass index (BMI) is a screening tool to identify possible weight problems. It provides an estimate of body fat based on height and weight. Your health care provider can find your BMI and can help you achieve or maintain a healthy weight.For adults 20 years and older:  A BMI below 18.5 is considered underweight.  A BMI of 18.5 to 24.9 is normal.  A BMI of 25 to 29.9 is considered overweight.  A BMI of  30 and above is considered obese.  Maintain normal blood lipids and cholesterol levels by exercising and minimizing your intake of saturated fat. Eat a balanced diet with plenty of fruit and vegetables. Blood tests for lipids and cholesterol should begin at age 76 and be repeated every 5 years. If your lipid or cholesterol levels are high, you are over 50, or you are at high risk for heart disease, you may need your cholesterol levels checked more frequently.Ongoing high lipid and cholesterol levels should be treated with medicines if diet and exercise are not working.  If you smoke, find out from your health care provider how to quit. If you do not use tobacco, do not start.  Lung cancer screening is recommended for adults aged 22-80 years who are at high risk for developing lung cancer because of a history of smoking. A yearly low-dose CT scan of the lungs is recommended for people who have at least a 30-pack-year history of smoking and are a current smoker or have quit within the past 15 years. A pack year of smoking is smoking an average of 1 pack of cigarettes a day for 1 year (for example: 1 pack a day for 30 years or 2 packs a day for 15 years). Yearly screening should continue until the smoker has stopped smoking for at least 15 years. Yearly screening should be stopped for people who develop a health problem that would prevent them from having lung cancer treatment.  If you are pregnant, do not drink alcohol. If you are breastfeeding,  be very cautious about drinking alcohol. If you are not pregnant and choose to drink alcohol, do not have more than 1 drink per day. One drink is considered to be 12 ounces (355 mL) of beer, 5 ounces (148 mL) of wine, or 1.5 ounces (44 mL) of liquor.  Avoid use of street drugs. Do not share needles with anyone. Ask for help if you need support or instructions about stopping the use of drugs.  High blood pressure causes heart disease and increases the risk of  stroke. Your blood pressure should be checked at least every 1 to 2 years. Ongoing high blood pressure should be treated with medicines if weight loss and exercise do not work.  If you are 75-52 years old, ask your health care provider if you should take aspirin to prevent strokes.  Diabetes screening involves taking a blood sample to check your fasting blood sugar level. This should be done once every 3 years, after age 15, if you are within normal weight and without risk factors for diabetes. Testing should be considered at a younger age or be carried out more frequently if you are overweight and have at least 1 risk factor for diabetes.  Breast cancer screening is essential preventive care for women. You should practice "breast self-awareness." This means understanding the normal appearance and feel of your breasts and may include breast self-examination. Any changes detected, no matter how small, should be reported to a health care provider. Women in their 58s and 30s should have a clinical breast exam (CBE) by a health care provider as part of a regular health exam every 1 to 3 years. After age 16, women should have a CBE every year. Starting at age 53, women should consider having a mammogram (breast X-ray test) every year. Women who have a family history of breast cancer should talk to their health care provider about genetic screening. Women at a high risk of breast cancer should talk to their health care providers about having an MRI and a mammogram every year.  Breast cancer gene (BRCA)-related cancer risk assessment is recommended for women who have family members with BRCA-related cancers. BRCA-related cancers include breast, ovarian, tubal, and peritoneal cancers. Having family members with these cancers may be associated with an increased risk for harmful changes (mutations) in the breast cancer genes BRCA1 and BRCA2. Results of the assessment will determine the need for genetic counseling and  BRCA1 and BRCA2 testing.  Routine pelvic exams to screen for cancer are no longer recommended for nonpregnant women who are considered low risk for cancer of the pelvic organs (ovaries, uterus, and vagina) and who do not have symptoms. Ask your health care provider if a screening pelvic exam is right for you.  If you have had past treatment for cervical cancer or a condition that could lead to cancer, you need Pap tests and screening for cancer for at least 20 years after your treatment. If Pap tests have been discontinued, your risk factors (such as having a new sexual partner) need to be reassessed to determine if screening should be resumed. Some women have medical problems that increase the chance of getting cervical cancer. In these cases, your health care provider may recommend more frequent screening and Pap tests.  The HPV test is an additional test that may be used for cervical cancer screening. The HPV test looks for the virus that can cause the cell changes on the cervix. The cells collected during the Pap test can be  tested for HPV. The HPV test could be used to screen women aged 30 years and older, and should be used in women of any age who have unclear Pap test results. After the age of 30, women should have HPV testing at the same frequency as a Pap test.  Colorectal cancer can be detected and often prevented. Most routine colorectal cancer screening begins at the age of 50 years and continues through age 75 years. However, your health care provider may recommend screening at an earlier age if you have risk factors for colon cancer. On a yearly basis, your health care provider may provide home test kits to check for hidden blood in the stool. Use of a small camera at the end of a tube, to directly examine the colon (sigmoidoscopy or colonoscopy), can detect the earliest forms of colorectal cancer. Talk to your health care provider about this at age 50, when routine screening begins. Direct  exam of the colon should be repeated every 5-10 years through age 75 years, unless early forms of pre-cancerous polyps or small growths are found.  People who are at an increased risk for hepatitis B should be screened for this virus. You are considered at high risk for hepatitis B if:  You were born in a country where hepatitis B occurs often. Talk with your health care provider about which countries are considered high risk.  Your parents were born in a high-risk country and you have not received a shot to protect against hepatitis B (hepatitis B vaccine).  You have HIV or AIDS.  You use needles to inject street drugs.  You live with, or have sex with, someone who has hepatitis B.  You get hemodialysis treatment.  You take certain medicines for conditions like cancer, organ transplantation, and autoimmune conditions.  Hepatitis C blood testing is recommended for all people born from 1945 through 1965 and any individual with known risks for hepatitis C.  Practice safe sex. Use condoms and avoid high-risk sexual practices to reduce the spread of sexually transmitted infections (STIs). STIs include gonorrhea, chlamydia, syphilis, trichomonas, herpes, HPV, and human immunodeficiency virus (HIV). Herpes, HIV, and HPV are viral illnesses that have no cure. They can result in disability, cancer, and death.  You should be screened for sexually transmitted illnesses (STIs) including gonorrhea and chlamydia if:  You are sexually active and are younger than 24 years.  You are older than 24 years and your health care provider tells you that you are at risk for this type of infection.  Your sexual activity has changed since you were last screened and you are at an increased risk for chlamydia or gonorrhea. Ask your health care provider if you are at risk.  If you are at risk of being infected with HIV, it is recommended that you take a prescription medicine daily to prevent HIV infection. This is  called preexposure prophylaxis (PrEP). You are considered at risk if:  You are a heterosexual woman, are sexually active, and are at increased risk for HIV infection.  You take drugs by injection.  You are sexually active with a partner who has HIV.  Talk with your health care provider about whether you are at high risk of being infected with HIV. If you choose to begin PrEP, you should first be tested for HIV. You should then be tested every 3 months for as long as you are taking PrEP.  Osteoporosis is a disease in which the bones lose minerals and strength   with aging. This can result in serious bone fractures or breaks. The risk of osteoporosis can be identified using a bone density scan. Women ages 65 years and over and women at risk for fractures or osteoporosis should discuss screening with their health care providers. Ask your health care provider whether you should take a calcium supplement or vitamin D to reduce the rate of osteoporosis.  Menopause can be associated with physical symptoms and risks. Hormone replacement therapy is available to decrease symptoms and risks. You should talk to your health care provider about whether hormone replacement therapy is right for you.  Use sunscreen. Apply sunscreen liberally and repeatedly throughout the day. You should seek shade when your shadow is shorter than you. Protect yourself by wearing long sleeves, pants, a wide-brimmed hat, and sunglasses year round, whenever you are outdoors.  Once a month, do a whole body skin exam, using a mirror to look at the skin on your back. Tell your health care provider of new moles, moles that have irregular borders, moles that are larger than a pencil eraser, or moles that have changed in shape or color.  Stay current with required vaccines (immunizations).  Influenza vaccine. All adults should be immunized every year.  Tetanus, diphtheria, and acellular pertussis (Td, Tdap) vaccine. Pregnant women should  receive 1 dose of Tdap vaccine during each pregnancy. The dose should be obtained regardless of the length of time since the last dose. Immunization is preferred during the 27th-36th week of gestation. An adult who has not previously received Tdap or who does not know her vaccine status should receive 1 dose of Tdap. This initial dose should be followed by tetanus and diphtheria toxoids (Td) booster doses every 10 years. Adults with an unknown or incomplete history of completing a 3-dose immunization series with Td-containing vaccines should begin or complete a primary immunization series including a Tdap dose. Adults should receive a Td booster every 10 years.  Varicella vaccine. An adult without evidence of immunity to varicella should receive 2 doses or a second dose if she has previously received 1 dose. Pregnant females who do not have evidence of immunity should receive the first dose after pregnancy. This first dose should be obtained before leaving the health care facility. The second dose should be obtained 4-8 weeks after the first dose.  Human papillomavirus (HPV) vaccine. Females aged 13-26 years who have not received the vaccine previously should obtain the 3-dose series. The vaccine is not recommended for use in pregnant females. However, pregnancy testing is not needed before receiving a dose. If a female is found to be pregnant after receiving a dose, no treatment is needed. In that case, the remaining doses should be delayed until after the pregnancy. Immunization is recommended for any person with an immunocompromised condition through the age of 26 years if she did not get any or all doses earlier. During the 3-dose series, the second dose should be obtained 4-8 weeks after the first dose. The third dose should be obtained 24 weeks after the first dose and 16 weeks after the second dose.  Zoster vaccine. One dose is recommended for adults aged 60 years or older unless certain conditions are  present.  Measles, mumps, and rubella (MMR) vaccine. Adults born before 1957 generally are considered immune to measles and mumps. Adults born in 1957 or later should have 1 or more doses of MMR vaccine unless there is a contraindication to the vaccine or there is laboratory evidence of immunity to   each of the three diseases. A routine second dose of MMR vaccine should be obtained at least 28 days after the first dose for students attending postsecondary schools, health care workers, or international travelers. People who received inactivated measles vaccine or an unknown type of measles vaccine during 1963-1967 should receive 2 doses of MMR vaccine. People who received inactivated mumps vaccine or an unknown type of mumps vaccine before 1979 and are at high risk for mumps infection should consider immunization with 2 doses of MMR vaccine. For females of childbearing age, rubella immunity should be determined. If there is no evidence of immunity, females who are not pregnant should be vaccinated. If there is no evidence of immunity, females who are pregnant should delay immunization until after pregnancy. Unvaccinated health care workers born before 1957 who lack laboratory evidence of measles, mumps, or rubella immunity or laboratory confirmation of disease should consider measles and mumps immunization with 2 doses of MMR vaccine or rubella immunization with 1 dose of MMR vaccine.  Pneumococcal 13-valent conjugate (PCV13) vaccine. When indicated, a person who is uncertain of her immunization history and has no record of immunization should receive the PCV13 vaccine. An adult aged 19 years or older who has certain medical conditions and has not been previously immunized should receive 1 dose of PCV13 vaccine. This PCV13 should be followed with a dose of pneumococcal polysaccharide (PPSV23) vaccine. The PPSV23 vaccine dose should be obtained at least 8 weeks after the dose of PCV13 vaccine. An adult aged 19  years or older who has certain medical conditions and previously received 1 or more doses of PPSV23 vaccine should receive 1 dose of PCV13. The PCV13 vaccine dose should be obtained 1 or more years after the last PPSV23 vaccine dose.  Pneumococcal polysaccharide (PPSV23) vaccine. When PCV13 is also indicated, PCV13 should be obtained first. All adults aged 65 years and older should be immunized. An adult younger than age 65 years who has certain medical conditions should be immunized. Any person who resides in a nursing home or long-term care facility should be immunized. An adult smoker should be immunized. People with an immunocompromised condition and certain other conditions should receive both PCV13 and PPSV23 vaccines. People with human immunodeficiency virus (HIV) infection should be immunized as soon as possible after diagnosis. Immunization during chemotherapy or radiation therapy should be avoided. Routine use of PPSV23 vaccine is not recommended for American Indians, [place] Natives, or people younger than 65 years unless there are medical conditions that require PPSV23 vaccine. When indicated, people who have unknown immunization and have no record of immunization should receive PPSV23 vaccine. One-time revaccination 5 years after the first dose of PPSV23 is recommended for people aged 19-64 years who have chronic kidney failure, nephrotic syndrome, asplenia, or immunocompromised conditions. People who received 1-2 doses of PPSV23 before age 65 years should receive another dose of PPSV23 vaccine at age 65 years or later if at least 5 years have passed since the previous dose. Doses of PPSV23 are not needed for people immunized with PPSV23 at or after age 65 years.  Meningococcal vaccine. Adults with asplenia or persistent complement component deficiencies should receive 2 doses of quadrivalent meningococcal conjugate (MenACWY-D) vaccine. The doses should be obtained at least 2 months apart.  Microbiologists working with certain meningococcal bacteria, military recruits, people at risk during an outbreak, and people who travel to or live in countries with a high rate of meningitis should be immunized. A first-year college student up through age   21 years who is living in a residence hall should receive a dose if she did not receive a dose on or after her 16th birthday. Adults who have certain high-risk conditions should receive one or more doses of vaccine.  Hepatitis A vaccine. Adults who wish to be protected from this disease, have certain high-risk conditions, work with hepatitis A-infected animals, work in hepatitis A research labs, or travel to or work in countries with a high rate of hepatitis A should be immunized. Adults who were previously unvaccinated and who anticipate close contact with an international adoptee during the first 60 days after arrival in the [place] from a country with a high rate of hepatitis A should be immunized.  Hepatitis B vaccine. Adults who wish to be protected from this disease, have certain high-risk conditions, may be exposed to blood or other infectious body fluids, are household contacts or sex partners of hepatitis B positive people, are clients or workers in certain care facilities, or travel to or work in countries with a high rate of hepatitis B should be immunized.  Haemophilus influenzae type b (Hib) vaccine. A previously unvaccinated person with asplenia or sickle cell disease or having a scheduled splenectomy should receive 1 dose of Hib vaccine. Regardless of previous immunization, a recipient of a hematopoietic stem cell transplant should receive a 3-dose series 6-12 months after her successful transplant. Hib vaccine is not recommended for adults with HIV infection. Preventive Services / Frequency Ages 64 to 68 years  Blood pressure check.** / Every 1 to 2 years.  Lipid and cholesterol check.** / Every 5 years beginning at age  22.  Clinical breast exam.** / Every 3 years for women in their 88s and 53s.  BRCA-related cancer risk assessment.** / For women who have family members with a BRCA-related cancer (breast, ovarian, tubal, or peritoneal cancers).  Pap test.** / Every 2 years from ages 90 through 51. Every 3 years starting at age 21 through age 56 or 3 with a history of 3 consecutive normal Pap tests.  HPV screening.** / Every 3 years from ages 24 through ages 1 to 46 with a history of 3 consecutive normal Pap tests.  Hepatitis C blood test.** / For any individual with known risks for hepatitis C.  Skin self-exam. / Monthly.  Influenza vaccine. / Every year.  Tetanus, diphtheria, and acellular pertussis (Tdap, Td) vaccine.** / Consult your health care provider. Pregnant women should receive 1 dose of Tdap vaccine during each pregnancy. 1 dose of Td every 10 years.  Varicella vaccine.** / Consult your health care provider. Pregnant females who do not have evidence of immunity should receive the first dose after pregnancy.  HPV vaccine. / 3 doses over 6 months, if 72 and younger. The vaccine is not recommended for use in pregnant females. However, pregnancy testing is not needed before receiving a dose.  Measles, mumps, rubella (MMR) vaccine.** / You need at least 1 dose of MMR if you were born in 1957 or later. You may also need a 2nd dose. For females of childbearing age, rubella immunity should be determined. If there is no evidence of immunity, females who are not pregnant should be vaccinated. If there is no evidence of immunity, females who are pregnant should delay immunization until after pregnancy.  Pneumococcal 13-valent conjugate (PCV13) vaccine.** / Consult your health care provider.  Pneumococcal polysaccharide (PPSV23) vaccine.** / 1 to 2 doses if you smoke cigarettes or if you have certain conditions.  Meningococcal vaccine.** /  1 dose if you are age 19 to 21 years and a first-year college  student living in a residence hall, or have one of several medical conditions, you need to get vaccinated against meningococcal disease. You may also need additional booster doses.  Hepatitis A vaccine.** / Consult your health care provider.  Hepatitis B vaccine.** / Consult your health care provider.  Haemophilus influenzae type b (Hib) vaccine.** / Consult your health care provider. Ages 40 to 64 years  Blood pressure check.** / Every 1 to 2 years.  Lipid and cholesterol check.** / Every 5 years beginning at age 20 years.  Lung cancer screening. / Every year if you are aged 55-80 years and have a 30-pack-year history of smoking and currently smoke or have quit within the past 15 years. Yearly screening is stopped once you have quit smoking for at least 15 years or develop a health problem that would prevent you from having lung cancer treatment.  Clinical breast exam.** / Every year after age 40 years.  BRCA-related cancer risk assessment.** / For women who have family members with a BRCA-related cancer (breast, ovarian, tubal, or peritoneal cancers).  Mammogram.** / Every year beginning at age 40 years and continuing for as long as you are in good health. Consult with your health care provider.  Pap test.** / Every 3 years starting at age 30 years through age 65 or 70 years with a history of 3 consecutive normal Pap tests.  HPV screening.** / Every 3 years from ages 30 years through ages 65 to 70 years with a history of 3 consecutive normal Pap tests.  Fecal occult blood test (FOBT) of stool. / Every year beginning at age 50 years and continuing until age 75 years. You may not need to do this test if you get a colonoscopy every 10 years.  Flexible sigmoidoscopy or colonoscopy.** / Every 5 years for a flexible sigmoidoscopy or every 10 years for a colonoscopy beginning at age 50 years and continuing until age 75 years.  Hepatitis C blood test.** / For all people born from 1945 through  1965 and any individual with known risks for hepatitis C.  Skin self-exam. / Monthly.  Influenza vaccine. / Every year.  Tetanus, diphtheria, and acellular pertussis (Tdap/Td) vaccine.** / Consult your health care provider. Pregnant women should receive 1 dose of Tdap vaccine during each pregnancy. 1 dose of Td every 10 years.  Varicella vaccine.** / Consult your health care provider. Pregnant females who do not have evidence of immunity should receive the first dose after pregnancy.  Zoster vaccine.** / 1 dose for adults aged 60 years or older.  Measles, mumps, rubella (MMR) vaccine.** / You need at least 1 dose of MMR if you were born in 1957 or later. You may also need a 2nd dose. For females of childbearing age, rubella immunity should be determined. If there is no evidence of immunity, females who are not pregnant should be vaccinated. If there is no evidence of immunity, females who are pregnant should delay immunization until after pregnancy.  Pneumococcal 13-valent conjugate (PCV13) vaccine.** / Consult your health care provider.  Pneumococcal polysaccharide (PPSV23) vaccine.** / 1 to 2 doses if you smoke cigarettes or if you have certain conditions.  Meningococcal vaccine.** / Consult your health care provider.  Hepatitis A vaccine.** / Consult your health care provider.  Hepatitis B vaccine.** / Consult your health care provider.  Haemophilus influenzae type b (Hib) vaccine.** / Consult your health care provider. Ages 65   years and over  Blood pressure check.** / Every 1 to 2 years.  Lipid and cholesterol check.** / Every 5 years beginning at age 22 years.  Lung cancer screening. / Every year if you are aged 73-80 years and have a 30-pack-year history of smoking and currently smoke or have quit within the past 15 years. Yearly screening is stopped once you have quit smoking for at least 15 years or develop a health problem that would prevent you from having lung cancer  treatment.  Clinical breast exam.** / Every year after age 4 years.  BRCA-related cancer risk assessment.** / For women who have family members with a BRCA-related cancer (breast, ovarian, tubal, or peritoneal cancers).  Mammogram.** / Every year beginning at age 40 years and continuing for as long as you are in good health. Consult with your health care provider.  Pap test.** / Every 3 years starting at age 9 years through age 34 or 91 years with 3 consecutive normal Pap tests. Testing can be stopped between 65 and 70 years with 3 consecutive normal Pap tests and no abnormal Pap or HPV tests in the past 10 years.  HPV screening.** / Every 3 years from ages 57 years through ages 64 or 45 years with a history of 3 consecutive normal Pap tests. Testing can be stopped between 65 and 70 years with 3 consecutive normal Pap tests and no abnormal Pap or HPV tests in the past 10 years.  Fecal occult blood test (FOBT) of stool. / Every year beginning at age 15 years and continuing until age 17 years. You may not need to do this test if you get a colonoscopy every 10 years.  Flexible sigmoidoscopy or colonoscopy.** / Every 5 years for a flexible sigmoidoscopy or every 10 years for a colonoscopy beginning at age 86 years and continuing until age 71 years.  Hepatitis C blood test.** / For all people born from [birth year] through 1963 and any individual with known risks for hepatitis C.  Osteoporosis screening.** / A one-time screening for women ages 83 years and over and women at risk for fractures or osteoporosis.  Skin self-exam. / Monthly.  Influenza vaccine. / Every year.  Tetanus, diphtheria, and acellular pertussis (Tdap/Td) vaccine.** / 1 dose of Td every 10 years.  Varicella vaccine.** / Consult your health care provider.  Zoster vaccine.** / 1 dose for adults aged 61 years or older.  Pneumococcal 13-valent conjugate (PCV13) vaccine.** / Consult your health care provider.  Pneumococcal  polysaccharide (PPSV23) vaccine.** / 1 dose for all adults aged 28 years and older.  Meningococcal vaccine.** / Consult your health care provider.  Hepatitis A vaccine.** / Consult your health care provider.  Hepatitis B vaccine.** / Consult your health care provider.  Haemophilus influenzae type b (Hib) vaccine.** / Consult your health care provider. ** Family history and personal history of risk and conditions may change your health care provider's recommendations. Document Released: 06/21/2001 Document Revised: 09/09/2013 Document Reviewed: 09/20/2010 [facility] Patient Information 2015 [place], [place]. This information is not intended to replace advice given to you by your health care provider. Make sure you discuss any questions you have with your health care provider.

## 2014-03-07 NOTE — Progress Notes (Signed)
Pre visit review using our clinic review tool, if applicable. No additional management support is needed unless otherwise documented below in the visit note. 

## 2014-03-07 NOTE — Progress Notes (Signed)
Subjective:    Carrie Hall is a 80 y.o. female who presents for Medicare Annual/Subsequent preventive examination.  Preventive Screening-Counseling & Management  Tobacco History  Smoking status  . Former Smoker -- 0.50 packs/day  . Types: Cigarettes  . Quit date: 12/06/2003  Smokeless tobacco  . Not on file     Problems Prior to Visit 1. none  Current Problems (verified) Patient Active Problem List   Diagnosis Date Noted  . KNEE PAIN, LEFT 05/27/2010  . DYSURIA 05/27/2010  . POSTMENOPAUSAL STATUS 04/23/2010  . CERVICAL POLYP 10/26/2009  . HEMATURIA, HX OF 10/26/2009  . DIZZINESS 06/11/2009  . FOOT, PAIN 11/17/2008  . OTHER ABNORMAL BLOOD CHEMISTRY 11/17/2008  . TINEA CRURIS 09/15/2008  . UTI 09/15/2008  . COLONIC POLYPS, ADENOMATOUS, HX OF 01/16/2008  . HEMOCCULT POSITIVE STOOL 12/11/2007  . BREAST CANCER, HX OF 12/11/2007  . HYPOTHYROIDISM 12/07/2007  . HYPERLIPIDEMIA 12/07/2007  . COPD 12/07/2007  . OSTEOPENIA 12/07/2007  . URI 03/13/2007  . HX, URINARY INFECTION 01/09/2007    Medications Prior to Visit Current Outpatient Prescriptions on File Prior to Visit  Medication Sig Dispense Refill  . ALPRAZolam (XANAX) 0.5 MG tablet TAKE ONE TABLET BY MOUTH THREE TIMES DAILY AS NEEDED  30 tablet  0  . levothyroxine (SYNTHROID, LEVOTHROID) 112 MCG tablet TAKE ONE TABLET BY MOUTH ONCE DAILY BEFORE BREAKFAST. PATIENT NEEDS AN APPOINTMENT  15 tablet  0   No current facility-administered medications on file prior to visit.    Current Medications (verified) Current Outpatient Prescriptions  Medication Sig Dispense Refill  . ALPRAZolam (XANAX) 0.5 MG tablet TAKE ONE TABLET BY MOUTH THREE TIMES DAILY AS NEEDED  30 tablet  0  . levothyroxine (SYNTHROID, LEVOTHROID) 112 MCG tablet TAKE ONE TABLET BY MOUTH ONCE DAILY BEFORE BREAKFAST. PATIENT NEEDS AN APPOINTMENT  15 tablet  0  . atorvastatin (LIPITOR) 20 MG tablet Take 1 tablet (20 mg total) by mouth daily.  90 tablet  3    No current facility-administered medications for this visit.     Allergies (verified) Review of patient's allergies indicates no known allergies.   PAST HISTORY  Family History Family History  Problem Relation Age of Onset  . Alzheimer's disease Mother   . Cancer Father     lung  . Alzheimer's disease Maternal Grandmother     Social History History  Substance Use Topics  . Smoking status: Former Smoker -- 0.50 packs/day    Types: Cigarettes    Quit date: 12/06/2003  . Smokeless tobacco: Not on file  . Alcohol Use: No     Are there smokers in your home (other than you)? No  Risk Factors Current exercise habits: The patient does not participate in regular exercise at present.  Dietary issues discussed: na   Cardiac risk factors: advanced age (older than 55 for men, 87 for women), dyslipidemia, hypertension and sedentary lifestyle.  Depression Screen (Note: if answer to either of the following is "Yes", a more complete depression screening is indicated)   Over the past two weeks, have you felt down, depressed or hopeless? No  Over the past two weeks, have you felt little interest or pleasure in doing things? No  Have you lost interest or pleasure in daily life? No  Do you often feel hopeless? No  Do you cry easily over simple problems? No  Activities of Daily Living In your present state of health, do you have any difficulty performing the following activities?:  Driving? Yes Managing money? yes  Feeding yourself? no Getting from bed to chair? No Climbing a flight of stairs? Yes Preparing food and eating?: no Bathing or showering? No Getting dressed: No Getting to the toilet? No Using the toilet:No Moving around from place to place: No In the past year have you fallen or had a near fall?:Yes--- Nov-- she had socks on on wood floors and slipped   Are you sexually active?  No  Do you have more than one partner?  No  Hearing Difficulties: no Do you often ask  people to speak up or repeat themselves? No Do you experience ringing or noises in your ears? No Do you have difficulty understanding soft or whispered voices? No   Do you feel that you have a problem with memory? No  Do you often misplace items? No  Do you feel safe at home?  Yes  Cognitive Testing  Alert? Yes  Normal Appearance?Yes  Oriented to person? Yes  Place? Yes   Time? Yes  Recall of three objects?  Yes  Can perform simple calculations? Yes  Displays appropriate judgment?Yes  Can read the correct time from a watch face?Yes   Advanced Directives have been discussed with the patient? Yes  List the Names of Other Physician/Practitioners you currently use: 1.  opht--[facility] 2.  Derm-- [facility]  Indicate any recent Medical Services you may have received from other than [facility] providers in the past year (date may be approximate).  Immunization History  Administered Date(s) Administered  . Influenza Split 03/15/2011  . Influenza Whole 03/21/2007, 02/04/2008, 02/25/2009, 03/11/2010, 02/07/2012  . Influenza,inj,Quad PF,36+ Mos 02/14/2013  . Pneumococcal Polysaccharide-23 05/15/2006, 12/11/2007  . Td 02/24/2004  . Zoster 11/02/2009    Screening Tests Health Maintenance  Topic Date Due  . Influenza Vaccine  12/07/2013  . Tetanus/tdap  02/23/2014  . Mammogram  07/16/2014  . Colonoscopy  01/16/2018  . Pneumococcal Polysaccharide Vaccine Age 31 And Over  Completed  . Zostavax  Completed    All answers were reviewed with the patient and necessary referrals were made:  Garnet Koyanagi, DO   03/07/2014   History reviewed:  She  has a past medical history of Thyroid disease; Hyperlipidemia; COPD (chronic obstructive pulmonary disease); Osteopenia; and Breast cancer. She  does not have any pertinent problems on file. She  has past surgical history that includes colon polyps and Breast surgery. Her family history includes Alzheimer's disease in her maternal grandmother and  mother; Cancer in her father. She  reports that she quit smoking about 10 years ago. Her smoking use included Cigarettes. She smoked 0.50 packs per day. She does not have any smokeless tobacco history on file. She reports that she does not drink alcohol or use illicit drugs. She has a current medication list which includes the following prescription(s): alprazolam, levothyroxine, and atorvastatin. Current Outpatient Prescriptions on File Prior to Visit  Medication Sig Dispense Refill  . ALPRAZolam (XANAX) 0.5 MG tablet TAKE ONE TABLET BY MOUTH THREE TIMES DAILY AS NEEDED  30 tablet  0  . levothyroxine (SYNTHROID, LEVOTHROID) 112 MCG tablet TAKE ONE TABLET BY MOUTH ONCE DAILY BEFORE BREAKFAST. PATIENT NEEDS AN APPOINTMENT  15 tablet  0   No current facility-administered medications on file prior to visit.   She has No Known Allergies.  Review of Systems  Review of Systems  Constitutional: Negative for activity change, appetite change and fatigue.  HENT: Negative for hearing loss, congestion, tinnitus and ear discharge.   Eyes: Negative for visual disturbance (see optho --  due) Respiratory: Negative for cough, chest tightness and shortness of breath.   Cardiovascular: Negative for chest pain, palpitations and leg swelling.  Gastrointestinal: Negative for abdominal pain, diarrhea, constipation and abdominal distention.  Genitourinary: Negative for urgency, frequency, decreased urine volume and difficulty urinating.  Musculoskeletal: Negative for back pain, arthralgias and gait problem.  Skin: Negative for color change, pallor and rash.  Neurological: Negative for dizziness, light-headedness, numbness and headaches.  Hematological: Negative for adenopathy. Does not bruise/bleed easily.  Psychiatric/Behavioral: Negative for suicidal ideas, confusion, sleep disturbance, self-injury, dysphoric mood, decreased concentration and agitation.  Pt is able to read and write and can do all ADLs No risk  for falling No abuse/ violence in home      Objective:     Vision by Snellen chart: opth  Body mass index is 26.99 kg/(m^2). BP 112/70  Pulse 63  Temp(Src) 98.1 F (36.7 C) (Oral)  Ht 5\' 5"  (1.651 m)  Wt 162 lb 3.2 oz (73.573 kg)  BMI 26.99 kg/m2  SpO2 97%  BP 112/70  Pulse 63  Temp(Src) 98.1 F (36.7 C) (Oral)  Ht 5\' 5"  (1.651 m)  Wt 162 lb 3.2 oz (73.573 kg)  BMI 26.99 kg/m2  SpO2 97% General appearance: alert, cooperative, appears stated age and no distress Head: Normocephalic, without obvious abnormality, atraumatic Eyes: negative findings: lids and lashes normal and pupils equal, round, reactive to light and accomodation Ears: normal TM's and external ear canals both ears Nose: Nares normal. Septum midline. Mucosa normal. No drainage or sinus tenderness. Throat: lips, mucosa, and tongue normal; teeth and gums normal Neck: no adenopathy, no carotid bruit, no JVD, supple, symmetrical, trachea midline and thyroid not enlarged, symmetric, no tenderness/mass/nodules Back: symmetric, no curvature. ROM normal. No CVA tenderness. Lungs: clear to auscultation bilaterally Breasts: pt refused Heart: S1, S2 normal Abdomen: soft, non-tender; bowel sounds normal; no masses,  no organomegaly Pelvic: not indicated; post-menopausal, no abnormal Pap smears in past Extremities: extremities normal, atraumatic, no cyanosis or edema Pulses: 2+ and symmetric Skin: Skin color, texture, turgor normal. No rashes or lesions Lymph nodes: Cervical, supraclavicular, and axillary nodes normal. Neurologic: Alert and oriented X 3, normal strength and tone. Normal symmetric reflexes. Normal coordination and gait Psych- no depression, no anxiety      Assessment:     cpe      Plan:     During the course of the visit the patient was educated and counseled about appropriate screening and preventive services including:    Pneumococcal vaccine   Influenza vaccine  Td  vaccine  Screening mammography  Glaucoma screening  Advanced directives: has an advanced directive - a copy HAS NOT been provided.  Diet review for nutrition referral? Yes ____  Not Indicated __x__   Patient Instructions (the written plan) was given to the patient.  Medicare Attestation I have personally reviewed: The patient's medical and social history Their use of alcohol, tobacco or illicit drugs Their current medications and supplements The patient's functional ability including ADLs,fall risks, home safety risks, cognitive, and hearing and visual impairment Diet and physical activities Evidence for depression or mood disorders  The patient's weight, height, BMI, and visual acuity have been recorded in the chart.  I have made referrals, counseling, and provided education to the patient based on review of the above and I have provided the patient with a written personalized care plan for preventive services.   1. Breast cancer screening  - MM DIGITAL SCREENING BILATERAL; Future  2. Hyperlipidemia Check  labs - atorvastatin (LIPITOR) 20 MG tablet; Take 1 tablet (20 mg total) by mouth daily.  Dispense: 90 tablet; Refill: 3 - Basic metabolic panel - CBC with Differential - Hepatic function panel - Lipid panel - POCT urinalysis dipstick  3. Other specified hypothyroidism  - TSH  4. Medicare annual wellness visit, subsequent     Garnet Koyanagi, DO   03/07/2014

## 2014-03-07 NOTE — Progress Notes (Signed)
Pt tolerated injections well without signs of a reaction.

## 2014-03-10 LAB — URINE CULTURE: Colony Count: >=100000

## 2014-03-10 MED ORDER — CIPROFLOXACIN HCL 500 MG PO TABS: 500.0000 mg | ORAL_TABLET | Freq: Two times a day (BID) | ORAL | Status: DC

## 2014-03-11 ENCOUNTER — Telehealth: Payer: Self-pay

## 2014-03-11 MED ORDER — LEVOTHYROXINE SODIUM 112 MCG PO TABS: ORAL_TABLET | ORAL | Status: DC

## 2014-03-11 NOTE — Telephone Encounter (Signed)
Patient aware med's have been sent and she has agreed to pick up the medication tomorrow morning. No further question or concerns    KP

## 2014-03-11 NOTE — Telephone Encounter (Signed)
Carrie Hall [phone] [facility] [address]  [name] called and said she went to pick up her medicine and they were no levothyroxine (SYNTHROID, LEVOTHROID) 112 MCG tablet, she is completely out, can we call that in so she could pick up tommorrow. She also wanted to know if she would be all right missing one today.

## 2014-04-21 ENCOUNTER — Telehealth: Payer: Self-pay | Admitting: Family Medicine

## 2014-04-21 MED ORDER — PRAVASTATIN SODIUM 40 MG PO TABS: 40.0000 mg | ORAL_TABLET | Freq: Every day | ORAL | Status: DC

## 2014-04-21 NOTE — Telephone Encounter (Signed)
Spoke with patient and I made her aware that I sent the Rx for the Pravachol, she agreed to stop the Lipitor and will pick up the new medication today.      KP

## 2014-04-21 NOTE — Telephone Encounter (Signed)
Please advise      KP 

## 2014-04-21 NOTE — Telephone Encounter (Signed)
Pt states cholesterol medication is making her itch in need of clinical advice

## 2014-04-21 NOTE — Telephone Encounter (Signed)
pravachol 40 mg #30  1 po qhs, 2 refills D/c lipitor

## 2014-05-22 ENCOUNTER — Telehealth: Payer: Self-pay | Admitting: Family Medicine

## 2014-05-22 ENCOUNTER — Other Ambulatory Visit: Payer: Self-pay | Admitting: Family Medicine

## 2014-05-22 MED ORDER — PRAVASTATIN SODIUM 40 MG PO TABS: 40.0000 mg | ORAL_TABLET | Freq: Every day | ORAL | Status: DC

## 2014-05-22 NOTE — Telephone Encounter (Signed)
Rx Pravachol 40 mg sent to the pharmacy.     KP

## 2014-05-22 NOTE — Telephone Encounter (Signed)
Caller name:Mcdermid, Johnasia Relation to KB:TCYE Call back number:[phone] Pharmacy:[facility] [address]   Reason for call: pt is needing rx  pravastatin (PRAVACHOL) 40 MG tablet

## 2014-06-09 ENCOUNTER — Ambulatory Visit (INDEPENDENT_AMBULATORY_CARE_PROVIDER_SITE_OTHER): Payer: Medicare Other | Admitting: Family Medicine

## 2014-06-09 ENCOUNTER — Encounter: Payer: Self-pay | Admitting: Family Medicine

## 2014-06-09 VITALS — BP 130/71 | HR 64 | Temp 98.3°F | Wt 157.8 lb

## 2014-06-09 DIAGNOSIS — F419 Anxiety disorder, unspecified: Secondary | ICD-10-CM

## 2014-06-09 DIAGNOSIS — E038 Other specified hypothyroidism: Secondary | ICD-10-CM

## 2014-06-09 DIAGNOSIS — K649 Unspecified hemorrhoids: Secondary | ICD-10-CM

## 2014-06-09 DIAGNOSIS — E785 Hyperlipidemia, unspecified: Secondary | ICD-10-CM

## 2014-06-09 DIAGNOSIS — E039 Hypothyroidism, unspecified: Secondary | ICD-10-CM

## 2014-06-09 LAB — LIPID PANEL
CHOL/HDL RATIO: 4
CHOLESTEROL: 199 mg/dL (ref 0–200)
HDL: 48.6 mg/dL (ref >39.00)
LDL Cholesterol: 112 mg/dL — ABNORMAL HIGH (ref 0–99)
NonHDL: 150.4
TRIGLYCERIDES: 193 mg/dL — AB (ref 0.0–149.0)
VLDL: 38.6 mg/dL (ref 0.0–40.0)

## 2014-06-09 LAB — HEPATIC FUNCTION PANEL
ALBUMIN: 4.1 g/dL (ref 3.5–5.2)
ALT: 9 U/L (ref 0–35)
AST: 12 U/L (ref 0–37)
Alkaline Phosphatase: 80 U/L (ref 39–117)
BILIRUBIN DIRECT: 0 mg/dL (ref 0.0–0.3)
TOTAL PROTEIN: 6.7 g/dL (ref 6.0–8.3)
Total Bilirubin: 0.4 mg/dL (ref 0.2–1.2)

## 2014-06-09 LAB — BASIC METABOLIC PANEL
BUN: 15 mg/dL (ref 6–23)
CHLORIDE: 102 meq/L (ref 96–112)
CO2: 30 mEq/L (ref 19–32)
CREATININE: 0.64 mg/dL (ref 0.40–1.20)
Calcium: 9.7 mg/dL (ref 8.4–10.5)
GFR: 95.27 mL/min (ref >60.00)
Glucose, Bld: 101 mg/dL — ABNORMAL HIGH (ref 70–99)
POTASSIUM: 4.3 meq/L (ref 3.5–5.1)
Sodium: 139 mEq/L (ref 135–145)

## 2014-06-09 LAB — TSH: TSH: 0.66 u[IU]/mL (ref 0.35–4.50)

## 2014-06-09 MED ORDER — HYDROCORTISONE ACETATE 25 MG RE SUPP: 25.0000 mg | Freq: Two times a day (BID) | RECTAL | Status: DC

## 2014-06-09 MED ORDER — ALPRAZOLAM 0.5 MG PO TABS: ORAL_TABLET | ORAL | Status: DC

## 2014-06-09 NOTE — Assessment & Plan Note (Signed)
con't synthroid Check TSH  

## 2014-06-09 NOTE — Progress Notes (Signed)
Subjective:    Patient ID: Carrie Hall, female    DOB: 1933-05-19, 81 y.o.   MRN: [id number]  HPI  Patient here for f/u thyroid and cholesterol.  Her sister in law is with her.  She is also c/o hemorrhoids bothering her and is asking for suppositories for them  Past Medical History  Diagnosis Date  . Thyroid disease   . Hyperlipidemia   . COPD (chronic obstructive pulmonary disease)   . Osteopenia   . Breast cancer     Review of Systems  Constitutional: Negative for activity change, appetite change, fatigue and unexpected weight change.  Respiratory: Negative for cough, chest tightness, shortness of breath and wheezing.   Cardiovascular: Negative for chest pain, palpitations and leg swelling.  Gastrointestinal: Negative for nausea, vomiting, abdominal pain, diarrhea, constipation, blood in stool, abdominal distention, anal bleeding and rectal pain.  Psychiatric/Behavioral: Negative for behavioral problems and dysphoric mood. The patient is not nervous/anxious.        Objective:    Physical Exam  Constitutional: She is oriented to person, place, and time. She appears well-developed and well-nourished. No distress.  HENT:  Right Ear: External ear normal.  Left Ear: External ear normal.  Nose: Nose normal.  Mouth/Throat: Oropharynx is clear and moist.  Neck: Normal range of motion. Neck supple. No thyromegaly present.  Cardiovascular: Normal rate, regular rhythm and normal heart sounds.   No murmur heard. Pulmonary/Chest: Effort normal and breath sounds normal. No respiratory distress. She has no wheezes. She has no rales. She exhibits no tenderness.  Neurological: She is alert and oriented to person, place, and time.  Psychiatric: She has a normal mood and affect. Her behavior is normal. Judgment and thought content normal.    BP 130/71 mmHg  Pulse 64  Temp(Src) 98.3 F (36.8 C) (Oral)  Wt 157 lb 12.8 oz (71.578 kg)  SpO2 96% Wt Readings from Last 3 Encounters:    06/09/14 157 lb 12.8 oz (71.578 kg)  03/07/14 162 lb 3.2 oz (73.573 kg)  03/28/13 140 lb (63.504 kg)     Lab Results  Component Value Date   WBC 8.8 03/07/2014   HGB 14.6 03/07/2014   HCT 43.5 03/07/2014   PLT 226.0 03/07/2014   GLUCOSE 101* 06/09/2014   CHOL 199 06/09/2014   TRIG 193.0* 06/09/2014   HDL 48.60 06/09/2014   LDLDIRECT 103.3 03/07/2014   LDLCALC 112* 06/09/2014   ALT 9 06/09/2014   AST 12 06/09/2014   NA 139 06/09/2014   K 4.3 06/09/2014   CL 102 06/09/2014   CREATININE 0.64 06/09/2014   BUN 15 06/09/2014   CO2 30 06/09/2014   TSH 0.66 06/09/2014   HGBA1C 6.1 07/20/2012    Dg Sacrum/coccyx  03/28/2013   CLINICAL DATA:  Status post fall.  Sacral pain.  EXAM: SACRUM AND COCCYX - 2+ VIEW  COMPARISON:  None.  FINDINGS: There is no evidence of fracture or other focal bone lesion.  IMPRESSION: Negative exam.   Electronically Signed   By: Inge Rise M.D.   On: 03/28/2013 10:03       Assessment & Plan:   Problem List Items Addressed This Visit    Hypothyroidism - Primary    con't synthroid Check TSH      Relevant Orders   TSH (Completed)   Hyperlipidemia LDL goal <100    con't pravastatin Check labs       Other Visit Diagnoses    Hyperlipidemia  Relevant Orders    Basic metabolic panel (Completed)    Hepatic function panel (Completed)    Lipid panel (Completed)    Anxiety disorder, unspecified anxiety disorder type        Relevant Medications    ALPRAZolam (XANAX) tablet    Hemorrhoids, unspecified hemorrhoid type        Relevant Medications    hydrocortisone (ANUSOL-HC) suppository 25 mg        Garnet Koyanagi, DO

## 2014-06-09 NOTE — Progress Notes (Signed)
Pre visit review using our clinic review tool, if applicable. No additional management support is needed unless otherwise documented below in the visit note. 

## 2014-06-09 NOTE — Assessment & Plan Note (Signed)
con't pravastatin Check labs

## 2014-06-09 NOTE — Patient Instructions (Signed)

## 2014-06-11 ENCOUNTER — Telehealth: Payer: Self-pay

## 2014-06-11 MED ORDER — FENOFIBRATE 160 MG PO TABS: 160.0000 mg | ORAL_TABLET | Freq: Every day | ORAL | Status: DC

## 2014-06-11 NOTE — Telephone Encounter (Signed)
-----   Message from Rosalita Chessman, DO sent at 06/09/2014  9:24 PM EST ----- Cholesterol--- LDL goal < 100,  HDL >40,  TG < 150.  Diet and exercise will increase HDL and decrease LDL and TG.  Fish,  Fish Oil, Flaxseed oil will also help increase the HDL and decrease Triglycerides.   Recheck labs in 3 months----con't pravastatin Add fenofibrate 160 mg #30  1 a day to lower TG and ldl some more.    Lipid,,hep--hyperlipidemia.

## 2014-06-11 NOTE — Telephone Encounter (Signed)
Patient has been made aware and verbalized understanding, she understands that her Triglycerides are still elevated and that she needs to start the Fenofibrate and continue the Pravastatin, she did not needs any refills on Pravastatin.  The Rx has been faxed and copy of the labs have been mailed.     KP

## 2014-07-09 ENCOUNTER — Telehealth: Payer: Self-pay | Admitting: Family Medicine

## 2014-07-09 DIAGNOSIS — E785 Hyperlipidemia, unspecified: Secondary | ICD-10-CM

## 2014-07-09 NOTE — Telephone Encounter (Signed)
Please call the patient about her labs as there is no order in the system [phone]

## 2014-07-10 NOTE — Telephone Encounter (Signed)
Patient needed an apt for Lab follow up. Apt scheduled.       KP

## 2014-09-02 ENCOUNTER — Other Ambulatory Visit: Payer: Self-pay | Admitting: Family Medicine

## 2014-09-15 ENCOUNTER — Other Ambulatory Visit: Payer: Medicare Other

## 2014-09-16 ENCOUNTER — Other Ambulatory Visit (INDEPENDENT_AMBULATORY_CARE_PROVIDER_SITE_OTHER): Payer: Medicare Other

## 2014-09-16 DIAGNOSIS — E785 Hyperlipidemia, unspecified: Secondary | ICD-10-CM | POA: Diagnosis not present

## 2014-09-16 LAB — HEPATIC FUNCTION PANEL
ALK PHOS: 58 U/L (ref 39–117)
ALT: 11 U/L (ref 0–35)
AST: 16 U/L (ref 0–37)
Albumin: 4 g/dL (ref 3.5–5.2)
Bilirubin, Direct: 0.1 mg/dL (ref 0.0–0.3)
TOTAL PROTEIN: 7 g/dL (ref 6.0–8.3)
Total Bilirubin: 0.4 mg/dL (ref 0.2–1.2)

## 2014-09-16 LAB — LIPID PANEL
CHOL/HDL RATIO: 3
CHOLESTEROL: 208 mg/dL — AB (ref 0–200)
HDL: 62.6 mg/dL (ref >39.00)
LDL Cholesterol: 121 mg/dL — ABNORMAL HIGH (ref 0–99)
NonHDL: 145.4
TRIGLYCERIDES: 122 mg/dL (ref 0.0–149.0)
VLDL: 24.4 mg/dL (ref 0.0–40.0)

## 2014-09-18 ENCOUNTER — Encounter: Payer: Self-pay | Admitting: Family Medicine

## 2014-09-18 ENCOUNTER — Other Ambulatory Visit: Payer: Self-pay | Admitting: Family Medicine

## 2014-09-18 MED ORDER — ATORVASTATIN CALCIUM 20 MG PO TABS: 20.0000 mg | ORAL_TABLET | Freq: Every day | ORAL | Status: DC

## 2014-09-18 NOTE — Telephone Encounter (Signed)
Who is Dr Bonnita Hollow?  And what dose of methotrexte? For what dx?

## 2014-09-19 ENCOUNTER — Other Ambulatory Visit: Payer: Self-pay | Admitting: Family Medicine

## 2014-09-19 DIAGNOSIS — L409 Psoriasis, unspecified: Secondary | ICD-10-CM

## 2014-09-19 MED ORDER — METHOTREXATE SODIUM 7.5 MG PO TABS: ORAL_TABLET | ORAL | Status: DC

## 2014-11-10 ENCOUNTER — Other Ambulatory Visit: Payer: Self-pay | Admitting: Family Medicine

## 2014-11-11 ENCOUNTER — Encounter: Payer: Self-pay | Admitting: Family Medicine

## 2014-12-08 ENCOUNTER — Encounter: Payer: Self-pay | Admitting: Gastroenterology

## 2014-12-08 ENCOUNTER — Ambulatory Visit (INDEPENDENT_AMBULATORY_CARE_PROVIDER_SITE_OTHER): Payer: Medicare Other | Admitting: Family Medicine

## 2014-12-08 ENCOUNTER — Encounter: Payer: Self-pay | Admitting: Family Medicine

## 2014-12-08 ENCOUNTER — Other Ambulatory Visit: Payer: Self-pay | Admitting: Family Medicine

## 2014-12-08 VITALS — BP 124/80 | HR 57 | Temp 98.3°F | Ht 65.0 in | Wt 156.4 lb

## 2014-12-08 DIAGNOSIS — I10 Essential (primary) hypertension: Secondary | ICD-10-CM

## 2014-12-08 DIAGNOSIS — E785 Hyperlipidemia, unspecified: Secondary | ICD-10-CM | POA: Diagnosis not present

## 2014-12-08 DIAGNOSIS — R8299 Other abnormal findings in urine: Secondary | ICD-10-CM

## 2014-12-08 DIAGNOSIS — N390 Urinary tract infection, site not specified: Secondary | ICD-10-CM

## 2014-12-08 DIAGNOSIS — Z1211 Encounter for screening for malignant neoplasm of colon: Secondary | ICD-10-CM | POA: Diagnosis not present

## 2014-12-08 DIAGNOSIS — R82998 Other abnormal findings in urine: Secondary | ICD-10-CM

## 2014-12-08 DIAGNOSIS — R5383 Other fatigue: Secondary | ICD-10-CM | POA: Diagnosis not present

## 2014-12-08 DIAGNOSIS — E039 Hypothyroidism, unspecified: Secondary | ICD-10-CM

## 2014-12-08 DIAGNOSIS — R634 Abnormal weight loss: Secondary | ICD-10-CM

## 2014-12-08 DIAGNOSIS — R8290 Unspecified abnormal findings in urine: Secondary | ICD-10-CM

## 2014-12-08 DIAGNOSIS — R197 Diarrhea, unspecified: Secondary | ICD-10-CM

## 2014-12-08 LAB — CBC WITH DIFFERENTIAL/PLATELET
BASOS PCT: 0.5 % (ref 0.0–3.0)
Basophils Absolute: 0 10*3/uL (ref 0.0–0.1)
EOS ABS: 0.1 10*3/uL (ref 0.0–0.7)
Eosinophils Relative: 1.4 % (ref 0.0–5.0)
HCT: 42.3 % (ref 36.0–46.0)
HEMOGLOBIN: 14 g/dL (ref 12.0–15.0)
LYMPHS ABS: 1.9 10*3/uL (ref 0.7–4.0)
LYMPHS PCT: 24.2 % (ref 12.0–46.0)
MCHC: 33.1 g/dL (ref 30.0–36.0)
MCV: 87.8 fl (ref 78.0–100.0)
MONO ABS: 0.7 10*3/uL (ref 0.1–1.0)
Monocytes Relative: 8.7 % (ref 3.0–12.0)
NEUTROS PCT: 65.2 % (ref 43.0–77.0)
Neutro Abs: 5 10*3/uL (ref 1.4–7.7)
Platelets: 276 10*3/uL (ref 150.0–400.0)
RBC: 4.82 Mil/uL (ref 3.87–5.11)
RDW: 15.8 % — AB (ref 11.5–15.5)
WBC: 7.6 10*3/uL (ref 4.0–10.5)

## 2014-12-08 LAB — POCT URINALYSIS DIPSTICK
BILIRUBIN UA: NEGATIVE
Blood, UA: NEGATIVE
Glucose, UA: NEGATIVE
Ketones, UA: NEGATIVE
Nitrite, UA: NEGATIVE
Protein, UA: NEGATIVE
Spec Grav, UA: 1.015
Urobilinogen, UA: 2
pH, UA: 6

## 2014-12-08 LAB — HEPATIC FUNCTION PANEL
ALK PHOS: 54 U/L (ref 39–117)
ALT: 12 U/L (ref 0–35)
AST: 15 U/L (ref 0–37)
Albumin: 4.2 g/dL (ref 3.5–5.2)
BILIRUBIN DIRECT: 0.1 mg/dL (ref 0.0–0.3)
BILIRUBIN TOTAL: 0.4 mg/dL (ref 0.2–1.2)
Total Protein: 7.1 g/dL (ref 6.0–8.3)

## 2014-12-08 LAB — LIPID PANEL
CHOL/HDL RATIO: 3
CHOLESTEROL: 177 mg/dL (ref 0–200)
HDL: 56.2 mg/dL (ref >39.00)
LDL Cholesterol: 101 mg/dL — ABNORMAL HIGH (ref 0–99)
NonHDL: 121.29
Triglycerides: 101 mg/dL (ref 0.0–149.0)
VLDL: 20.2 mg/dL (ref 0.0–40.0)

## 2014-12-08 LAB — BASIC METABOLIC PANEL
BUN: 14 mg/dL (ref 6–23)
CO2: 32 mEq/L (ref 19–32)
CREATININE: 0.79 mg/dL (ref 0.40–1.20)
Calcium: 9.8 mg/dL (ref 8.4–10.5)
Chloride: 101 mEq/L (ref 96–112)
GFR: 74.63 mL/min (ref >60.00)
GLUCOSE: 96 mg/dL (ref 70–99)
Potassium: 4.3 mEq/L (ref 3.5–5.1)
Sodium: 141 mEq/L (ref 135–145)

## 2014-12-08 LAB — VITAMIN B12: VITAMIN B 12: 484 pg/mL (ref 211–911)

## 2014-12-08 LAB — TSH: TSH: 1.33 u[IU]/mL (ref 0.35–4.50)

## 2014-12-08 NOTE — Progress Notes (Signed)
Pre visit review using our clinic review tool, if applicable. No additional management support is needed unless otherwise documented below in the visit note. 

## 2014-12-08 NOTE — Assessment & Plan Note (Signed)
Check labs con't synthroid 

## 2014-12-08 NOTE — Patient Instructions (Addendum)
Cholesterol Cholesterol is a white, waxy, fat-like substance needed by your body in small amounts. The liver makes all the cholesterol you need. Cholesterol is carried from the liver by the blood through the blood vessels. Deposits of cholesterol (plaque) may build up on blood vessel walls. These make the arteries narrower and stiffer. Cholesterol plaques increase the risk for heart attack and stroke.  You cannot feel your cholesterol level even if it is very high. The only way to know it is high is with a blood test. Once you know your cholesterol levels, you should keep a record of the test results. Work with your health care provider to keep your levels in the desired range.  WHAT DO THE RESULTS MEAN?  Total cholesterol is a rough measure of all the cholesterol in your blood.   LDL is the so-called bad cholesterol. This is the type that deposits cholesterol in the walls of the arteries. You want this level to be low.   HDL is the good cholesterol because it cleans the arteries and carries the LDL away. You want this level to be high.  Triglycerides are fat that the body can either burn for energy or store. High levels are closely linked to heart disease.  WHAT ARE THE DESIRED LEVELS OF CHOLESTEROL?  Total cholesterol below 200.   LDL below 100 for people at risk, below 70 for those at very high risk.   HDL above 50 is good, above 60 is best.   Triglycerides below 150.  HOW CAN I LOWER MY CHOLESTEROL?  Diet. Follow your diet programs as directed by your health care provider.   Choose fish or white meat chicken and [place], roasted or baked. Limit fatty cuts of red meat, fried foods, and processed meats, such as sausage and lunch meats.   Eat lots of fresh fruits and vegetables.  Choose whole grains, beans, pasta, potatoes, and cereals.   Use only small amounts of olive, corn, or canola oils.   Avoid butter, mayonnaise, shortening, or palm kernel oils.  Avoid foods with  trans fats.   Drink skim or nonfat milk and eat low-fat or nonfat yogurt and cheeses. Avoid whole milk, cream, ice cream, egg yolks, and full-fat cheeses.   Healthy desserts include angel food cake, ginger snaps, animal crackers, hard candy, popsicles, and low-fat or nonfat frozen yogurt. Avoid pastries, cakes, pies, and cookies.   Exercise. Follow your exercise programs as directed by your health care provider.   A regular program helps decrease LDL and raise HDL.   A regular program helps with weight control.   Do things that increase your activity level like gardening, walking, or taking the stairs. Ask your health care provider about how you can be more active in your daily life.   Medicine. Take medicine only as directed by your health care provider.   Medicine may be prescribed by your health care provider to help lower cholesterol and decrease the risk for heart disease.   If you have several risk factors, you may need medicine even if your levels are normal. Document Released: 01/18/2001 Document Revised: 09/09/2013 Document Reviewed: 02/06/2013 [facility] Patient Information 2015 [place], [place]. This information is not intended to replace advice given to you by your health care provider. Make sure you discuss any questions you have with your health care provider.  Diarrhea Diarrhea is frequent loose and watery bowel movements. It can cause you to feel weak and dehydrated. Dehydration can cause you to become tired and thirsty,  have a dry mouth, and have decreased urination that often is dark yellow. Diarrhea is a sign of another problem, most often an infection that will not last long. In most cases, diarrhea typically lasts 2-3 days. However, it can last longer if it is a sign of something more serious. It is important to treat your diarrhea as directed by your caregiver to lessen or prevent future episodes of diarrhea. CAUSES  Some common causes include:  Gastrointestinal  infections caused by viruses, bacteria, or parasites.  Food poisoning or food allergies.  Certain medicines, such as antibiotics, chemotherapy, and laxatives.  Artificial sweeteners and fructose.  Digestive disorders. HOME CARE INSTRUCTIONS  Ensure adequate fluid intake (hydration): Have 1 cup (8 oz) of fluid for each diarrhea episode. Avoid fluids that contain simple sugars or sports drinks, fruit juices, whole milk products, and sodas. Your urine should be clear or pale yellow if you are drinking enough fluids. Hydrate with an oral rehydration solution that you can purchase at pharmacies, retail stores, and online. You can prepare an oral rehydration solution at home by mixing the following ingredients together:   - tsp table salt.   tsp baking soda.   tsp salt substitute containing potassium chloride.  1  tablespoons sugar.  1 L (34 oz) of water.  Certain foods and beverages may increase the speed at which food moves through the gastrointestinal (GI) tract. These foods and beverages should be avoided and include:  Caffeinated and alcoholic beverages.  High-fiber foods, such as raw fruits and vegetables, nuts, seeds, and whole grain breads and cereals.  Foods and beverages sweetened with sugar alcohols, such as xylitol, sorbitol, and mannitol.  Some foods may be well tolerated and may help thicken stool including:  Starchy foods, such as rice, toast, pasta, low-sugar cereal, oatmeal, grits, baked potatoes, crackers, and bagels.  Bananas.  Applesauce.  Add probiotic-rich foods to help increase healthy bacteria in the GI tract, such as yogurt and fermented milk products.  Wash your hands well after each diarrhea episode.  Only take over-the-counter or prescription medicines as directed by your caregiver.  Take a warm bath to relieve any burning or pain from frequent diarrhea episodes. SEEK IMMEDIATE MEDICAL CARE IF:   You are unable to keep fluids down.  You have  persistent vomiting.  You have blood in your stool, or your stools are black and tarry.  You do not urinate in 6-8 hours, or there is only a small amount of very dark urine.  You have abdominal pain that increases or localizes.  You have weakness, dizziness, confusion, or light-headedness.  You have a severe headache.  Your diarrhea gets worse or does not get better.  You have a fever or persistent symptoms for more than 2-3 days.  You have a fever and your symptoms suddenly get worse. MAKE SURE YOU:   Understand these instructions.  Will watch your condition.  Will get help right away if you are not doing well or get worse. Document Released: 04/15/2002 Document Revised: 09/09/2013 Document Reviewed: 01/01/2012 [facility] Patient Information 2015 [place], [place]. This information is not intended to replace advice given to you by your health care provider. Make sure you discuss any questions you have with your health care provider.

## 2014-12-08 NOTE — Progress Notes (Signed)
Patient ID: Carrie Hall, female    DOB: [birth year]  Age: 81 y.o. MRN: [id number]    Subjective:  Subjective HPI [name] presents for f/u cholesterol and htn.  She c/o loose stools several times a day.  Some foods cause it but it is not necessarily related to food always.  Her rectum is irritated.  No n/v , no heartburn, burping.  No chest pain, sob or palpitations.   The loose stools started before the methotrexate.    Review of Systems  Constitutional: Negative for diaphoresis, appetite change, fatigue and unexpected weight change.  Eyes: Negative for pain, redness and visual disturbance.  Respiratory: Negative for cough, chest tightness, shortness of breath and wheezing.   Cardiovascular: Negative for chest pain, palpitations and leg swelling.  Gastrointestinal: Positive for diarrhea. Negative for nausea, vomiting, abdominal pain, constipation, blood in stool, abdominal distention and anal bleeding.  Endocrine: Negative for cold intolerance, heat intolerance, polydipsia, polyphagia and polyuria.  Genitourinary: Negative for dysuria, frequency and difficulty urinating.  Neurological: Negative for dizziness, light-headedness, numbness and headaches.  Psychiatric/Behavioral: Negative for decreased concentration. The patient is not nervous/anxious.     History Past Medical History  Diagnosis Date  . Thyroid disease   . Hyperlipidemia   . COPD (chronic obstructive pulmonary disease)   . Osteopenia   . Breast cancer     She has past surgical history that includes colon polyps and Breast surgery.   Her family history includes Alzheimer's disease in her maternal grandmother and mother; Cancer in her father.She reports that she quit smoking about 11 years ago. Her smoking use included Cigarettes. She smoked 0.50 packs per day. She does not have any smokeless tobacco history on file. She reports that she does not drink alcohol or use illicit drugs.  Current Outpatient Prescriptions  on File Prior to Visit  Medication Sig Dispense Refill  . ALPRAZolam (XANAX) 0.5 MG tablet TAKE ONE TABLET BY MOUTH THREE TIMES DAILY AS NEEDED 30 tablet 0  . atorvastatin (LIPITOR) 20 MG tablet Take 1 tablet (20 mg total) by mouth daily. 30 tablet 2  . beta carotene w/minerals (OCUVITE) tablet Take 1 tablet by mouth daily.    . cyanocobalamin 500 MCG tablet Take 500 mcg by mouth daily.    . fenofibrate 160 MG tablet TAKE ONE TABLET BY MOUTH ONCE DAILY 30 tablet 0  . hydrocortisone (ANUSOL-HC) 25 MG suppository Place 1 suppository (25 mg total) rectally 2 (two) times daily. 12 suppository 0  . levothyroxine (SYNTHROID, LEVOTHROID) 112 MCG tablet TAKE ONE TABLET BY MOUTH ONCE DAILY BEFORE BREAKFAST 30 tablet 11  . methotrexate (RHEUMATREX) 7.5 MG tablet Caution" Chemotherapy. Protect from light.  Pt is taking it bid x 1 days only 8 tablet 0   No current facility-administered medications on file prior to visit.     Objective:  Objective Physical Exam  Constitutional: She is oriented to person, place, and time. She appears well-developed and well-nourished.  HENT:  Head: Normocephalic and atraumatic.  Eyes: Conjunctivae and EOM are normal.  Neck: Normal range of motion. Neck supple. No JVD present. Carotid bruit is not present. No thyromegaly present.  Cardiovascular: Normal rate, regular rhythm and normal heart sounds.   No murmur heard. Pulmonary/Chest: Effort normal and breath sounds normal. No respiratory distress. She has no wheezes. She has no rales. She exhibits no tenderness.  Abdominal: She exhibits no distension and no mass. There is no tenderness. There is no rebound and no guarding.  Musculoskeletal: She  exhibits no edema.  Neurological: She is alert and oriented to person, place, and time.  Psychiatric: She has a normal mood and affect. Her behavior is normal.   BP 124/80 mmHg  Pulse 57  Temp(Src) 98.3 F (36.8 C) (Oral)  Ht 5\' 5"  (1.651 m)  Wt 156 lb 6.4 oz (70.943 kg)   BMI 26.03 kg/m2  SpO2 97% Wt Readings from Last 3 Encounters:  12/08/14 156 lb 6.4 oz (70.943 kg)  06/09/14 157 lb 12.8 oz (71.578 kg)  03/07/14 162 lb 3.2 oz (73.573 kg)     Lab Results  Component Value Date   WBC 8.8 03/07/2014   HGB 14.6 03/07/2014   HCT 43.5 03/07/2014   PLT 226.0 03/07/2014   GLUCOSE 101* 06/09/2014   CHOL 208* 09/16/2014   TRIG 122.0 09/16/2014   HDL 62.60 09/16/2014   LDLDIRECT 103.3 03/07/2014   LDLCALC 121* 09/16/2014   ALT 11 09/16/2014   AST 16 09/16/2014   NA 139 06/09/2014   K 4.3 06/09/2014   CL 102 06/09/2014   CREATININE 0.64 06/09/2014   BUN 15 06/09/2014   CO2 30 06/09/2014   TSH 0.66 06/09/2014   HGBA1C 6.1 07/20/2012    Dg Sacrum/coccyx  03/28/2013   CLINICAL DATA:  Status post fall.  Sacral pain.  EXAM: SACRUM AND COCCYX - 2+ VIEW  COMPARISON:  None.  FINDINGS: There is no evidence of fracture or other focal bone lesion.  IMPRESSION: Negative exam.   Electronically Signed   By: Inge Rise M.D.   On: 03/28/2013 10:03     Assessment & Plan:  Plan I am having Ms. Carrie Hall maintain her levothyroxine, cyanocobalamin, beta carotene w/minerals, ALPRAZolam, hydrocortisone, atorvastatin, methotrexate, fenofibrate, and folic acid.  Meds ordered this encounter  Medications  . folic acid (FOLVITE) 1 MG tablet    Sig: Take 1 mg by mouth daily.    Problem List Items Addressed This Visit    None    Visit Diagnoses    Leukocytes in urine    -  Primary    Relevant Orders    Urine Culture    Cloudy urine        Relevant Orders    POCT Urinalysis Dipstick (Completed)    Hyperlipidemia        Relevant Orders    Hepatic function panel    Lipid panel    Hypothyroidism, unspecified hypothyroidism type        Relevant Orders    TSH    Essential hypertension        Relevant Orders    Basic metabolic panel    Diarrhea        Relevant Orders    Ambulatory referral to Gastroenterology    Loss of weight        Relevant Orders     Ambulatory referral to Gastroenterology       Follow-up: Return in about 6 months (around 06/10/2015), or if symptoms worsen or fail to improve, for hypertension, hyperlipidemia.  Garnet Koyanagi, DO

## 2014-12-08 NOTE — Assessment & Plan Note (Signed)
con't lipitor Check labs 

## 2014-12-11 LAB — URINE CULTURE: Colony Count: >=100000

## 2014-12-16 ENCOUNTER — Encounter: Payer: Self-pay | Admitting: *Deleted

## 2014-12-18 ENCOUNTER — Telehealth: Payer: Self-pay | Admitting: Family Medicine

## 2014-12-18 ENCOUNTER — Other Ambulatory Visit: Payer: Self-pay

## 2014-12-18 DIAGNOSIS — Z1211 Encounter for screening for malignant neoplasm of colon: Secondary | ICD-10-CM

## 2014-12-18 LAB — POC HEMOCCULT BLD/STL (HOME/3-CARD/SCREEN)
Card #1 Date: 8
Card #2 Fecal Occult Blod, POC: NEGATIVE
Card #3 Date: 8
Card #3 Fecal Occult Blood, POC: NEGATIVE
FECAL OCCULT BLD: NEGATIVE

## 2014-12-18 NOTE — Telephone Encounter (Signed)
Caller name: Petra Kuba [phone]  Reason for call: Calling for results of Cologard cards. Pt has appt tomorrow 12/19/14 9:30am and they were hoping to have results before going. I don't see them in the system. Please call Joycelyn Schmid, she is on [place].

## 2014-12-18 NOTE — Telephone Encounter (Signed)
I made the patient aware that the Stool cards were normal.      KP

## 2014-12-18 NOTE — Addendum Note (Signed)
Addended by: Tasia Catchings on: 12/18/2014 04:15 PM   Modules accepted: Orders

## 2014-12-19 ENCOUNTER — Encounter: Payer: Self-pay | Admitting: Gastroenterology

## 2014-12-19 ENCOUNTER — Ambulatory Visit (INDEPENDENT_AMBULATORY_CARE_PROVIDER_SITE_OTHER): Payer: Medicare Other | Admitting: Gastroenterology

## 2014-12-19 VITALS — BP 128/72 | HR 57 | Ht 64.0 in | Wt 157.4 lb

## 2014-12-19 DIAGNOSIS — R197 Diarrhea, unspecified: Secondary | ICD-10-CM | POA: Diagnosis not present

## 2014-12-19 NOTE — Progress Notes (Signed)
12/19/2014 Carrie Hall [id number] 1934-01-01   HISTORY OF PRESENT ILLNESS:  This is a pleasant 81 year old female who is previously known to Dr. Carlean Purl for colonoscopy in September 2009 at which time she was found to have sigmoid diverticulosis, internal hemorrhoids, and 2 diminutive polyps that were removed and were hyperplastic on pathology.  She presents to our office today at the request of her PCP, Dr. Etter Sjogren, for evaluation of diarrhea. The patient is here today with a relative to discuss this intermittent diarrhea that has been present for approximately the past 8 months or so.  Diarrhea does not occur every day. She says that it usually occurs with spicy foods and says specifically spaghetti or [employer] chicken.  She uses Imodium as needed, which does help. She denies seeing any blood in her stool and denies abdominal pain. She denies any new medications except for the methotrexate, however, the diarrhea had started before initiating thhis therapy just 4 months ago. She has lost approximately 10 pounds over the past year, but says that her appetite has been decreased since her husband died. She lost her husband within the past year and a half or so and has been under a lot of stress with that. Her relative says that she is very anxious person. Stool hemoccult cards were negative.  CBC, BMP, hepatic function panel, TSH were all WNL's.  She is not really interested in colonoscopy unless absolutely necessary.   Past Medical History  Diagnosis Date  . Thyroid disease   . Hyperlipidemia   . COPD (chronic obstructive pulmonary disease)   . Osteopenia   . Breast cancer   . Diverticulosis   . Internal hemorrhoids   . Adenomatous colon polyp    Past Surgical History  Procedure Laterality Date  . Colon polyps    . Breast surgery      reports that she quit smoking about 11 years ago. Her smoking use included Cigarettes. She smoked 0.50 packs per day. She does not have any smokeless  tobacco history on file. She reports that she does not drink alcohol or use illicit drugs. family history includes Alzheimer's disease in her maternal grandmother and mother; Cancer in her father. No Known Allergies    Outpatient Encounter Prescriptions as of 12/19/2014  Medication Sig  . ALPRAZolam (XANAX) 0.5 MG tablet TAKE ONE TABLET BY MOUTH THREE TIMES DAILY AS NEEDED  . atorvastatin (LIPITOR) 20 MG tablet Take 1 tablet (20 mg total) by mouth daily.  . beta carotene w/minerals (OCUVITE) tablet Take 1 tablet by mouth daily.  . cyanocobalamin 500 MCG tablet Take 500 mcg by mouth daily.  . fenofibrate 160 MG tablet TAKE ONE TABLET BY MOUTH ONCE DAILY  . folic acid (FOLVITE) 1 MG tablet Take 1 mg by mouth daily.  . hydrocortisone (ANUSOL-HC) 25 MG suppository Place 1 suppository (25 mg total) rectally 2 (two) times daily.  Marland Kitchen levothyroxine (SYNTHROID, LEVOTHROID) 112 MCG tablet TAKE ONE TABLET BY MOUTH ONCE DAILY BEFORE BREAKFAST  . methotrexate (RHEUMATREX) 7.5 MG tablet Caution" Chemotherapy. Protect from light.  Pt is taking it bid x 1 days only   No facility-administered encounter medications on file as of 12/19/2014.     REVIEW OF SYSTEMS  : All other systems reviewed and negative except where noted in the History of Present Illness.   PHYSICAL EXAM: BP 128/72 mmHg  Pulse 57  Ht 5\' 4"  (1.626 m)  Wt 157 lb 6.4 oz (71.396 kg)  BMI 27.00 kg/m2 General:  Well developed white female in no acute distress Head: Normocephalic and atraumatic Eyes:  Sclerae anicteric, conjunctiva pink. Ears: Normal auditory acuity Lungs: Clear throughout to auscultation Heart: Slightly bradycardic but regular rhythm Abdomen: Soft, non-distended.  Normal bowel sounds.  Non-tender. Musculoskeletal: Symmetrical with no gross deformities  Skin: No lesions on visible extremities Extremities: No edema  Neurological: Alert oriented x 4, grossly non-focal Psychological:  Alert and cooperative. Normal mood  and affect  ASSESSMENT AND PLAN: -Diarrhea:  Intermittent for the past 8 months, occuring with only certain foods.  Lost her husband a couple of years ago, under a lot of stress.  ? IBS.  Will begin taking Florastor twice daily.  Anti-diarrheal such as Imodium prn.  Return to office if diarrhea worsens, increases in frequency, begins seeing blood, etc at which time we would possible need to consider colonoscopy.    CC:  Rosalita Chessman, DO

## 2014-12-19 NOTE — Patient Instructions (Addendum)
Please purchase the following medications over the counter and take as directed: Florastor and take twice a day  Anti-diarrheal medication as needed  Please follow up as needed

## 2014-12-29 NOTE — Progress Notes (Signed)
Agree with Ms. Zehr's management.  Deshayla Empson E. Marytza Grandpre, MD, FACG  

## 2015-02-26 ENCOUNTER — Ambulatory Visit (INDEPENDENT_AMBULATORY_CARE_PROVIDER_SITE_OTHER): Payer: Medicare Other

## 2015-02-26 DIAGNOSIS — Z23 Encounter for immunization: Secondary | ICD-10-CM | POA: Diagnosis not present

## 2015-03-09 ENCOUNTER — Other Ambulatory Visit: Payer: Self-pay | Admitting: Family Medicine

## 2015-04-14 ENCOUNTER — Other Ambulatory Visit: Payer: Self-pay | Admitting: Family Medicine

## 2015-04-14 NOTE — Telephone Encounter (Signed)
Last filled:  09/18/14 Amt: 30, 2 Last OV:  12/08/14 Next appt: 06/23/15  Med filled x 90 days.

## 2015-06-16 ENCOUNTER — Other Ambulatory Visit: Payer: Self-pay | Admitting: Family Medicine

## 2015-06-23 ENCOUNTER — Ambulatory Visit: Payer: Medicare Other | Admitting: Family Medicine

## 2015-07-20 ENCOUNTER — Other Ambulatory Visit: Payer: Self-pay | Admitting: Family Medicine

## 2015-07-24 ENCOUNTER — Ambulatory Visit (INDEPENDENT_AMBULATORY_CARE_PROVIDER_SITE_OTHER): Payer: Medicare Other | Admitting: Family Medicine

## 2015-07-24 ENCOUNTER — Encounter: Payer: Self-pay | Admitting: Family Medicine

## 2015-07-24 VITALS — BP 116/60 | HR 71 | Temp 98.1°F | Ht 64.0 in | Wt 153.8 lb

## 2015-07-24 DIAGNOSIS — E039 Hypothyroidism, unspecified: Secondary | ICD-10-CM | POA: Diagnosis not present

## 2015-07-24 DIAGNOSIS — E038 Other specified hypothyroidism: Secondary | ICD-10-CM

## 2015-07-24 DIAGNOSIS — F411 Generalized anxiety disorder: Secondary | ICD-10-CM | POA: Diagnosis not present

## 2015-07-24 DIAGNOSIS — I10 Essential (primary) hypertension: Secondary | ICD-10-CM

## 2015-07-24 DIAGNOSIS — E785 Hyperlipidemia, unspecified: Secondary | ICD-10-CM | POA: Diagnosis not present

## 2015-07-24 DIAGNOSIS — R82998 Other abnormal findings in urine: Secondary | ICD-10-CM

## 2015-07-24 DIAGNOSIS — R8299 Other abnormal findings in urine: Secondary | ICD-10-CM

## 2015-07-24 LAB — POCT URINALYSIS DIPSTICK
Bilirubin, UA: NEGATIVE
GLUCOSE UA: NEGATIVE
KETONES UA: NEGATIVE
Nitrite, UA: NEGATIVE
PROTEIN UA: NEGATIVE
RBC UA: NEGATIVE
SPEC GRAV UA: 1.025
Urobilinogen, UA: 0.2
pH, UA: 6

## 2015-07-24 LAB — CBC WITH DIFFERENTIAL/PLATELET
BASOS PCT: 0.8 % (ref 0.0–3.0)
Basophils Absolute: 0.1 10*3/uL (ref 0.0–0.1)
EOS PCT: 1 % (ref 0.0–5.0)
Eosinophils Absolute: 0.1 10*3/uL (ref 0.0–0.7)
HEMATOCRIT: 40.2 % (ref 36.0–46.0)
HEMOGLOBIN: 13.3 g/dL (ref 12.0–15.0)
LYMPHS PCT: 23.8 % (ref 12.0–46.0)
Lymphs Abs: 1.5 10*3/uL (ref 0.7–4.0)
MCHC: 33 g/dL (ref 30.0–36.0)
MCV: 89.3 fl (ref 78.0–100.0)
MONO ABS: 0.3 10*3/uL (ref 0.1–1.0)
MONOS PCT: 5.1 % (ref 3.0–12.0)
Neutro Abs: 4.3 10*3/uL (ref 1.4–7.7)
Neutrophils Relative %: 69.3 % (ref 43.0–77.0)
Platelets: 316 10*3/uL (ref 150.0–400.0)
RBC: 4.5 Mil/uL (ref 3.87–5.11)
RDW: 14.5 % (ref 11.5–15.5)
WBC: 6.3 10*3/uL (ref 4.0–10.5)

## 2015-07-24 LAB — LIPID PANEL
CHOL/HDL RATIO: 3
CHOLESTEROL: 162 mg/dL (ref 0–200)
HDL: 55.6 mg/dL (ref >39.00)
LDL CALC: 93 mg/dL (ref 0–99)
NonHDL: 106.56
Triglycerides: 69 mg/dL (ref 0.0–149.0)
VLDL: 13.8 mg/dL (ref 0.0–40.0)

## 2015-07-24 LAB — COMPREHENSIVE METABOLIC PANEL
ALBUMIN: 4.2 g/dL (ref 3.5–5.2)
ALK PHOS: 49 U/L (ref 39–117)
ALT: 12 U/L (ref 0–35)
AST: 14 U/L (ref 0–37)
BUN: 21 mg/dL (ref 6–23)
CALCIUM: 9.9 mg/dL (ref 8.4–10.5)
CHLORIDE: 99 meq/L (ref 96–112)
CO2: 33 mEq/L — ABNORMAL HIGH (ref 19–32)
Creatinine, Ser: 0.82 mg/dL (ref 0.40–1.20)
GFR: 71.37 mL/min (ref >60.00)
Glucose, Bld: 105 mg/dL — ABNORMAL HIGH (ref 70–99)
POTASSIUM: 4.2 meq/L (ref 3.5–5.1)
Sodium: 138 mEq/L (ref 135–145)
TOTAL PROTEIN: 6.9 g/dL (ref 6.0–8.3)
Total Bilirubin: 0.5 mg/dL (ref 0.2–1.2)

## 2015-07-24 LAB — TSH: TSH: 1.07 u[IU]/mL (ref 0.35–4.50)

## 2015-07-24 MED ORDER — LEVOTHYROXINE SODIUM 112 MCG PO TABS: ORAL_TABLET | ORAL | Status: DC

## 2015-07-24 MED ORDER — FENOFIBRATE 160 MG PO TABS: 160.0000 mg | ORAL_TABLET | Freq: Every day | ORAL | Status: DC

## 2015-07-24 MED ORDER — SERTRALINE HCL 50 MG PO TABS: 50.0000 mg | ORAL_TABLET | Freq: Every day | ORAL | Status: DC

## 2015-07-24 MED ORDER — ATORVASTATIN CALCIUM 20 MG PO TABS: 20.0000 mg | ORAL_TABLET | Freq: Every day | ORAL | Status: DC

## 2015-07-24 MED ORDER — ALPRAZOLAM 0.25 MG PO TABS: 0.2500 mg | ORAL_TABLET | Freq: Every evening | ORAL | Status: DC | PRN

## 2015-07-24 NOTE — Patient Instructions (Signed)

## 2015-07-24 NOTE — Assessment & Plan Note (Signed)
con't lipitor Check labs 

## 2015-07-24 NOTE — Assessment & Plan Note (Signed)
Stable Check labs 

## 2015-07-24 NOTE — Addendum Note (Signed)
Addended by: Caffie Pinto on: 07/24/2015 01:27 PM   Modules accepted: Orders

## 2015-07-24 NOTE — Progress Notes (Signed)
Patient ID: Carrie Hall, female    DOB: December 02, 1933  Age: 82 y.o. MRN: [id number]    Subjective:  Subjective HPI Carrie Hall presents for f/u cholesterol , and thyroid.     Review of Systems  Constitutional: Negative for diaphoresis, appetite change, fatigue and unexpected weight change.  Eyes: Negative for pain, redness and visual disturbance.  Respiratory: Negative for cough, chest tightness, shortness of breath and wheezing.   Cardiovascular: Negative for chest pain, palpitations and leg swelling.  Endocrine: Negative for cold intolerance, heat intolerance, polydipsia, polyphagia and polyuria.  Genitourinary: Negative for dysuria, frequency and difficulty urinating.  Neurological: Negative for dizziness, light-headedness, numbness and headaches.    History Past Medical History  Diagnosis Date  . Thyroid disease   . Hyperlipidemia   . COPD (chronic obstructive pulmonary disease) ([place])   . Osteopenia   . Breast cancer ([place])   . Diverticulosis   . Internal hemorrhoids   . Adenomatous colon polyp     She has past surgical history that includes colon polyps and Breast surgery.   Her family history includes Alzheimer's disease in her maternal grandmother and mother; Cancer in her father.She reports that she quit smoking about 11 years ago. Her smoking use included Cigarettes. She smoked 0.50 packs per day. She does not have any smokeless tobacco history on file. She reports that she does not drink alcohol or use illicit drugs.  Current Outpatient Prescriptions on File Prior to Visit  Medication Sig Dispense Refill  . beta carotene w/minerals (OCUVITE) tablet Take 1 tablet by mouth daily.    . cyanocobalamin 500 MCG tablet Take 500 mcg by mouth daily.    . folic acid (FOLVITE) 1 MG tablet Take 1 mg by mouth daily.    . hydrocortisone (ANUSOL-HC) 25 MG suppository Place 1 suppository (25 mg total) rectally 2 (two) times daily. 12 suppository 0   No current  facility-administered medications on file prior to visit.     Objective:  Objective Physical Exam  Constitutional: She is oriented to person, place, and time. She appears well-developed and well-nourished.  HENT:  Head: Normocephalic and atraumatic.  Eyes: Conjunctivae and EOM are normal.  Neck: Normal range of motion. Neck supple. No JVD present. Carotid bruit is not present. No thyromegaly present.  Cardiovascular: Normal rate, regular rhythm and normal heart sounds.   No murmur heard. Pulmonary/Chest: Effort normal and breath sounds normal. No respiratory distress. She has no wheezes. She has no rales. She exhibits no tenderness.  Musculoskeletal: She exhibits no edema.  Neurological: She is alert and oriented to person, place, and time.  Psychiatric: Her speech is normal. Judgment and thought content normal. Her mood appears anxious. Her affect is not angry, not blunt, not labile and not inappropriate. She is hyperactive. She is not agitated, not slowed, not withdrawn and not actively hallucinating. Cognition and memory are normal. She does not exhibit a depressed mood. She is attentive.  Nursing note and vitals reviewed.  BP 116/60 mmHg  Pulse 71  Temp(Src) 98.1 F (36.7 C) (Oral)  Ht '5\' 4"'  (1.626 m)  Wt 153 lb 12.8 oz (69.763 kg)  BMI 26.39 kg/m2  SpO2 97% Wt Readings from Last 3 Encounters:  07/24/15 153 lb 12.8 oz (69.763 kg)  12/19/14 157 lb 6.4 oz (71.396 kg)  12/08/14 156 lb 6.4 oz (70.943 kg)     Lab Results  Component Value Date   WBC 7.6 12/08/2014   HGB 14.0 12/08/2014   HCT 42.3 12/08/2014  PLT 276.0 12/08/2014   GLUCOSE 96 12/08/2014   CHOL 177 12/08/2014   TRIG 101.0 12/08/2014   HDL 56.20 12/08/2014   LDLDIRECT 103.3 03/07/2014   LDLCALC 101* 12/08/2014   ALT 12 12/08/2014   AST 15 12/08/2014   NA 141 12/08/2014   K 4.3 12/08/2014   CL 101 12/08/2014   CREATININE 0.79 12/08/2014   BUN 14 12/08/2014   CO2 32 12/08/2014   TSH 1.33 12/08/2014    HGBA1C 6.1 07/20/2012    Dg Sacrum/coccyx  03/28/2013  CLINICAL DATA:  Status post fall.  Sacral pain. EXAM: SACRUM AND COCCYX - 2+ VIEW COMPARISON:  None. FINDINGS: There is no evidence of fracture or other focal bone lesion. IMPRESSION: Negative exam. Electronically Signed   By: Inge Rise M.D.   On: 03/28/2013 10:03     Assessment & Plan:  Plan I have discontinued [name]'s ALPRAZolam and atorvastatin. I have also changed her fenofibrate and atorvastatin. Additionally, I am having her start on ALPRAZolam and sertraline. Lastly, I am having her maintain her cyanocobalamin, beta carotene w/minerals, hydrocortisone, folic acid, methotrexate, and levothyroxine.  Meds ordered this encounter  Medications  . methotrexate (RHEUMATREX) 2.5 MG tablet    Sig: 3 in the morning and 3 at night    Refill:  2  . levothyroxine (SYNTHROID, LEVOTHROID) 112 MCG tablet    Sig: TAKE ONE TABLET BY MOUTH ONCE DAILY BEFORE BREAKFAST    Dispense:  90 tablet    Refill:  1  . fenofibrate 160 MG tablet    Sig: Take 1 tablet (160 mg total) by mouth daily.    Dispense:  90 tablet    Refill:  1  . atorvastatin (LIPITOR) 20 MG tablet    Sig: Take 1 tablet (20 mg total) by mouth daily.    Dispense:  90 tablet    Refill:  1  . ALPRAZolam (XANAX) 0.25 MG tablet    Sig: Take 1 tablet (0.25 mg total) by mouth at bedtime as needed for anxiety.    Dispense:  30 tablet    Refill:  0  . sertraline (ZOLOFT) 50 MG tablet    Sig: Take 1 tablet (50 mg total) by mouth daily.    Dispense:  30 tablet    Refill:  3    Problem List Items Addressed This Visit    None    Visit Diagnoses    Hypothyroidism, unspecified hypothyroidism type    -  Primary    Relevant Medications    levothyroxine (SYNTHROID, LEVOTHROID) 112 MCG tablet    Other Relevant Orders    POCT urinalysis dipstick    TSH    Hyperlipidemia        Relevant Medications    fenofibrate 160 MG tablet    atorvastatin (LIPITOR) 20 MG tablet     Other Relevant Orders    Comp Met (CMET)    CBC with Differential/Platelet    Lipid panel    Essential hypertension        Relevant Medications    fenofibrate 160 MG tablet    atorvastatin (LIPITOR) 20 MG tablet    Other Relevant Orders    Comp Met (CMET)    CBC with Differential/Platelet    Lipid panel    POCT urinalysis dipstick    TSH    Generalized anxiety disorder        Relevant Medications    ALPRAZolam (XANAX) 0.25 MG tablet    sertraline (ZOLOFT) 50 MG tablet  Follow-up: Return in about 6 months (around 01/24/2016), or if symptoms worsen or fail to improve, for hypertension, hyperlipidemia.  Garnet Koyanagi, DO

## 2015-07-24 NOTE — Progress Notes (Signed)
Pre visit review using our clinic review tool, if applicable. No additional management support is needed unless otherwise documented below in the visit note. 

## 2015-07-26 LAB — URINE CULTURE: Colony Count: >=100000

## 2015-07-27 ENCOUNTER — Telehealth: Payer: Self-pay | Admitting: Family Medicine

## 2015-07-27 NOTE — Telephone Encounter (Signed)
Spoke with patient and the Sertraline is causing Diarrhea and she wanted you to know that she stopped taking the medication and does not want a replacement,     KP

## 2015-07-27 NOTE — Telephone Encounter (Signed)
noted 

## 2015-07-27 NOTE — Telephone Encounter (Signed)
Pt called in because she has a few questions about sertraline medication that PCP prescribed.   CB: [id number]

## 2015-08-25 ENCOUNTER — Ambulatory Visit (INDEPENDENT_AMBULATORY_CARE_PROVIDER_SITE_OTHER): Payer: Medicare Other | Admitting: Family Medicine

## 2015-08-25 ENCOUNTER — Encounter: Payer: Self-pay | Admitting: Family Medicine

## 2015-08-25 VITALS — BP 127/78 | HR 67 | Temp 98.4°F | Ht 64.0 in | Wt 156.0 lb

## 2015-08-25 DIAGNOSIS — R413 Other amnesia: Secondary | ICD-10-CM

## 2015-08-25 DIAGNOSIS — H6123 Impacted cerumen, bilateral: Secondary | ICD-10-CM | POA: Diagnosis not present

## 2015-08-25 DIAGNOSIS — H9193 Unspecified hearing loss, bilateral: Secondary | ICD-10-CM

## 2015-08-25 DIAGNOSIS — R350 Frequency of micturition: Secondary | ICD-10-CM

## 2015-08-25 DIAGNOSIS — H60393 Other infective otitis externa, bilateral: Secondary | ICD-10-CM

## 2015-08-25 DIAGNOSIS — H6120 Impacted cerumen, unspecified ear: Secondary | ICD-10-CM | POA: Insufficient documentation

## 2015-08-25 DIAGNOSIS — R8290 Unspecified abnormal findings in urine: Secondary | ICD-10-CM

## 2015-08-25 LAB — POCT URINALYSIS DIPSTICK
Bilirubin, UA: NEGATIVE
GLUCOSE UA: NEGATIVE
Ketones, UA: NEGATIVE
NITRITE UA: NEGATIVE
PH UA: 6.5
PROTEIN UA: NEGATIVE
RBC UA: NEGATIVE
SPEC GRAV UA: 1.02
UROBILINOGEN UA: 0.2

## 2015-08-25 MED ORDER — DONEPEZIL HCL 5 MG PO TABS: 5.0000 mg | ORAL_TABLET | Freq: Every day | ORAL | Status: DC

## 2015-08-25 MED ORDER — OFLOXACIN 0.3 % OT SOLN: 5.0000 [drp] | Freq: Every day | OTIC | Status: DC

## 2015-08-25 NOTE — Progress Notes (Signed)
Patient ID: TIAA KIAH, female    DOB: October 15, 1933  Age: 82 y.o. MRN: [id number]    Subjective:  Subjective HPI Carrie Hall presents with c/o memory loss and dec hearing.   Review of Systems  Constitutional: Negative for diaphoresis, appetite change, fatigue and unexpected weight change.  HENT: Positive for hearing loss.   Eyes: Negative for pain, redness and visual disturbance.  Respiratory: Negative for cough, chest tightness, shortness of breath and wheezing.   Cardiovascular: Negative for chest pain, palpitations and leg swelling.  Endocrine: Negative for cold intolerance, heat intolerance, polydipsia, polyphagia and polyuria.  Genitourinary: Negative for dysuria, frequency and difficulty urinating.  Neurological: Negative for dizziness, light-headedness, numbness and headaches.  Psychiatric/Behavioral: Positive for confusion.    History Past Medical History  Diagnosis Date  . Thyroid disease   . Hyperlipidemia   . COPD (chronic obstructive pulmonary disease) ([place])   . Osteopenia   . Breast cancer ([place])   . Diverticulosis   . Internal hemorrhoids   . Adenomatous colon polyp     She has past surgical history that includes colon polyps and Breast surgery.   Her family history includes Alzheimer's disease in her maternal grandmother and mother; Cancer in her father.She reports that she quit smoking about 11 years ago. Her smoking use included Cigarettes. She smoked 0.50 packs per day. She does not have any smokeless tobacco history on file. She reports that she does not drink alcohol or use illicit drugs.  Current Outpatient Prescriptions on File Prior to Visit  Medication Sig Dispense Refill  . ALPRAZolam (XANAX) 0.25 MG tablet Take 1 tablet (0.25 mg total) by mouth at bedtime as needed for anxiety. 30 tablet 0  . atorvastatin (LIPITOR) 20 MG tablet Take 1 tablet (20 mg total) by mouth daily. 90 tablet 1  . cyanocobalamin 500 MCG tablet Take 500 mcg by mouth daily.      . fenofibrate 160 MG tablet Take 1 tablet (160 mg total) by mouth daily. 90 tablet 1  . folic acid (FOLVITE) 1 MG tablet Take 1 mg by mouth daily.    Marland Kitchen levothyroxine (SYNTHROID, LEVOTHROID) 112 MCG tablet TAKE ONE TABLET BY MOUTH ONCE DAILY BEFORE BREAKFAST 90 tablet 1  . methotrexate (RHEUMATREX) 2.5 MG tablet 3 in the morning and 3 at night  2   No current facility-administered medications on file prior to visit.     Objective:  Objective Physical Exam  Constitutional: She is oriented to person, place, and time. She appears well-developed and well-nourished.  HENT:  Head: Normocephalic and atraumatic.  Cerumen impaction  Eyes: Conjunctivae and EOM are normal.  Neck: Normal range of motion. Neck supple. No JVD present. Carotid bruit is not present. No thyromegaly present.  Cardiovascular: Normal rate, regular rhythm and normal heart sounds.   No murmur heard. Pulmonary/Chest: Effort normal and breath sounds normal. No respiratory distress. She has no wheezes. She has no rales. She exhibits no tenderness.  Musculoskeletal: She exhibits no edema.  Neurological: She is alert and oriented to person, place, and time.  mmse 21/30  Psychiatric: She has a normal mood and affect.  Nursing note and vitals reviewed.  BP 127/78 mmHg  Pulse 67  Temp(Src) 98.4 F (36.9 C) (Oral)  Ht 5\' 4"  (1.626 m)  Wt 156 lb (70.761 kg)  BMI 26.76 kg/m2  SpO2 96% Wt Readings from Last 3 Encounters:  08/25/15 156 lb (70.761 kg)  07/24/15 153 lb 12.8 oz (69.763 kg)  12/19/14 157 lb 6.4  oz (71.396 kg)     Lab Results  Component Value Date   WBC 6.3 07/24/2015   HGB 13.3 07/24/2015   HCT 40.2 07/24/2015   PLT 316.0 07/24/2015   GLUCOSE 105* 07/24/2015   CHOL 162 07/24/2015   TRIG 69.0 07/24/2015   HDL 55.60 07/24/2015   LDLDIRECT 103.3 03/07/2014   LDLCALC 93 07/24/2015   ALT 12 07/24/2015   AST 14 07/24/2015   NA 138 07/24/2015   K 4.2 07/24/2015   CL 99 07/24/2015   CREATININE 0.82  07/24/2015   BUN 21 07/24/2015   CO2 33* 07/24/2015   TSH 1.07 07/24/2015   HGBA1C 6.1 07/20/2012    Dg Sacrum/coccyx  03/28/2013  CLINICAL DATA:  Status post fall.  Sacral pain. EXAM: SACRUM AND COCCYX - 2+ VIEW COMPARISON:  None. FINDINGS: There is no evidence of fracture or other focal bone lesion. IMPRESSION: Negative exam. Electronically Signed   By: Inge Rise M.D.   On: 03/28/2013 10:03     Assessment & Plan:  Plan I have discontinued Ms. Jilek's beta carotene w/minerals, hydrocortisone, and sertraline. I am also having her start on ofloxacin and donepezil. Additionally, I am having her maintain her cyanocobalamin, folic acid, methotrexate, levothyroxine, fenofibrate, atorvastatin, and ALPRAZolam.  Meds ordered this encounter  Medications  . ofloxacin (FLOXIN OTIC) 0.3 % otic solution    Sig: Place 5 drops into both ears daily.    Dispense:  5 mL    Refill:  0  . donepezil (ARICEPT) 5 MG tablet    Sig: Take 1 tablet (5 mg total) by mouth at bedtime.    Dispense:  30 tablet    Refill:  2    Problem List Items Addressed This Visit      Unprioritized   Cerumen impaction    Irrigated successfully---unable to remove with hoop       Hearing loss of both ears    Dec hearing today with hearing test after irrigating ears  refer to audiology      Memory loss    Check labs mmse 21/30 Mri brain Refer to neuropsych aricept 5 mg daily      Relevant Medications   donepezil (ARICEPT) 5 MG tablet   Other Relevant Orders   Ambulatory referral to Neuropsychology   MR Brain Wo Contrast   Vitamin B12   TSH   CBC with Differential/Platelet   Comprehensive metabolic panel    Other Visit Diagnoses    Otitis, externa, infective, bilateral    -  Primary    Relevant Medications    ofloxacin (FLOXIN OTIC) 0.3 % otic solution    Hearing loss, bilateral        Relevant Orders    Ambulatory referral to Audiology    Urinary frequency        Relevant Orders    POCT  urinalysis dipstick (Completed)    Abnormal finding in urine        Relevant Orders    Urine culture       Follow-up: Return in about 3 months (around 11/24/2015), or if symptoms worsen or fail to improve.  [name], DO

## 2015-08-25 NOTE — Progress Notes (Signed)
Pre visit review using our clinic review tool, if applicable. No additional management support is needed unless otherwise documented below in the visit note. 

## 2015-08-25 NOTE — Assessment & Plan Note (Signed)
Dec hearing today with hearing test after irrigating ears  refer to audiology

## 2015-08-25 NOTE — Patient Instructions (Signed)

## 2015-08-25 NOTE — Assessment & Plan Note (Signed)
Check labs mmse 21/30 Mri brain Refer to neuropsych aricept 5 mg daily

## 2015-08-25 NOTE — Assessment & Plan Note (Signed)
Irrigated successfully---unable to remove with hoop

## 2015-08-26 LAB — COMPREHENSIVE METABOLIC PANEL
ALBUMIN: 4.2 g/dL (ref 3.5–5.2)
ALT: 18 U/L (ref 0–35)
AST: 20 U/L (ref 0–37)
Alkaline Phosphatase: 46 U/L (ref 39–117)
BUN: 18 mg/dL (ref 6–23)
CHLORIDE: 100 meq/L (ref 96–112)
CO2: 32 meq/L (ref 19–32)
CREATININE: 0.88 mg/dL (ref 0.40–1.20)
Calcium: 10.1 mg/dL (ref 8.4–10.5)
GFR: 65.77 mL/min (ref >60.00)
Glucose, Bld: 90 mg/dL (ref 70–99)
POTASSIUM: 4.3 meq/L (ref 3.5–5.1)
SODIUM: 140 meq/L (ref 135–145)
Total Bilirubin: 0.4 mg/dL (ref 0.2–1.2)
Total Protein: 6.9 g/dL (ref 6.0–8.3)

## 2015-08-26 LAB — CBC WITH DIFFERENTIAL/PLATELET
BASOS PCT: 0.4 % (ref 0.0–3.0)
Basophils Absolute: 0 10*3/uL (ref 0.0–0.1)
EOS ABS: 0.1 10*3/uL (ref 0.0–0.7)
EOS PCT: 1.1 % (ref 0.0–5.0)
HCT: 41 % (ref 36.0–46.0)
HEMOGLOBIN: 13.7 g/dL (ref 12.0–15.0)
LYMPHS ABS: 1.3 10*3/uL (ref 0.7–4.0)
Lymphocytes Relative: 18.2 % (ref 12.0–46.0)
MCHC: 33.5 g/dL (ref 30.0–36.0)
MCV: 89.6 fl (ref 78.0–100.0)
MONO ABS: 0.5 10*3/uL (ref 0.1–1.0)
Monocytes Relative: 7 % (ref 3.0–12.0)
NEUTROS ABS: 5.1 10*3/uL (ref 1.4–7.7)
Neutrophils Relative %: 73.3 % (ref 43.0–77.0)
PLATELETS: 303 10*3/uL (ref 150.0–400.0)
RBC: 4.58 Mil/uL (ref 3.87–5.11)
RDW: 15.4 % (ref 11.5–15.5)
WBC: 6.9 10*3/uL (ref 4.0–10.5)

## 2015-08-26 LAB — VITAMIN B12: Vitamin B-12: 277 pg/mL (ref 211–911)

## 2015-08-26 LAB — TSH: TSH: 1.27 u[IU]/mL (ref 0.35–4.50)

## 2015-08-27 LAB — URINE CULTURE

## 2015-08-31 ENCOUNTER — Other Ambulatory Visit: Payer: Self-pay

## 2015-08-31 ENCOUNTER — Telehealth: Payer: Self-pay | Admitting: Family Medicine

## 2015-08-31 MED ORDER — CIPROFLOXACIN HCL 250 MG PO TABS: 250.0000 mg | ORAL_TABLET | Freq: Two times a day (BID) | ORAL | Status: DC

## 2015-08-31 MED ORDER — CYANOCOBALAMIN 1000 MCG/ML IJ SOLN: 1000.0000 ug | INTRAMUSCULAR | Status: DC

## 2015-08-31 MED ORDER — "SYRINGE/NEEDLE (DISP) 25G X 5/8"" 3 ML MISC": Status: DC

## 2015-08-31 NOTE — Telephone Encounter (Signed)
Rx was sent today.    KP

## 2015-08-31 NOTE — Telephone Encounter (Signed)
Can be reached: [phone] [facility]: [facility]  Reason for call: Pt saw msg on mychart about yeast inf and med being sent in for her. She called pharmacy and they do not have it. Please send in for her today.

## 2015-09-01 ENCOUNTER — Telehealth: Payer: Self-pay | Admitting: Family Medicine

## 2015-09-01 NOTE — Telephone Encounter (Signed)
The order is in but now in the work que for behavioral health. They will call the patient for the appointment.      KP

## 2015-09-01 NOTE — Telephone Encounter (Signed)
Caller name: MARGARET MCGEE (SISTER IN LAW) Call back number:[phone]  Reason for call:    Checking on the status of neuro referral. Please advise referral doesn't reflect in chart

## 2015-09-04 ENCOUNTER — Telehealth: Payer: Self-pay

## 2015-09-04 NOTE — Telephone Encounter (Signed)
Caller name:Carrie Hall Relation to [id number] in law  Call back number:[phone]   Reason for call:  Carrie Hall ([place]) would like to know specifically the location and contact # were referral was placed so she can call office directly to speed up the process. Mrs. Carrie Hall stated she's worried by the time the specialist call patient is not going to want to go. Advised PCP/CMA is out of the office today and will follow up Monday 09/07/15

## 2015-09-04 NOTE — Telephone Encounter (Signed)
error 

## 2015-09-05 ENCOUNTER — Ambulatory Visit (HOSPITAL_BASED_OUTPATIENT_CLINIC_OR_DEPARTMENT_OTHER)
Admission: RE | Admit: 2015-09-05 | Discharge: 2015-09-05 | Disposition: A | Discharge status: Home / Self Care | Payer: Medicare Other | Source: Ambulatory Visit | Attending: Family Medicine | Admitting: Family Medicine

## 2015-09-05 DIAGNOSIS — R413 Other amnesia: Secondary | ICD-10-CM | POA: Diagnosis present

## 2015-09-07 NOTE — Telephone Encounter (Signed)
Spoke with Ms Carrie Hall, referral sent to [facility]

## 2015-09-21 ENCOUNTER — Telehealth: Payer: Self-pay | Admitting: Family Medicine

## 2015-09-21 NOTE — Telephone Encounter (Signed)
yes

## 2015-09-21 NOTE — Telephone Encounter (Signed)
Can be reached: [phone]   Reason for call: Pt sister not able to give b12 shot this time. Can she come in office to get it? Please notify pt.

## 2015-09-21 NOTE — Telephone Encounter (Signed)
Please advise.//AB/CMA 

## 2015-09-22 NOTE — Telephone Encounter (Signed)
Appointment scheduled for patient for Thursday.

## 2015-09-25 ENCOUNTER — Ambulatory Visit: Payer: Medicare Other

## 2015-09-29 ENCOUNTER — Telehealth: Payer: Self-pay | Admitting: Family Medicine

## 2015-09-29 NOTE — Telephone Encounter (Signed)
Caller name:Margaret Relationship to patient: Sister Can be reached: [phone]   Reason for call: Request call back about referral for memory loss

## 2015-09-29 NOTE — Telephone Encounter (Signed)
Patient states she had gotten a memory test and hearing test and did not do so good. Patient reports he sister in law is the one that wants her to get apt but she does not want referral, because she thinks that her memory is fine and also reports that she is going somewhere in [place] to get her hearing looked at but did not think she needed the referal for the memory check.

## 2015-09-29 NOTE — Telephone Encounter (Signed)
noted 

## 2015-09-29 NOTE — Telephone Encounter (Signed)
Someone needs to call her please and see what she would like

## 2015-11-04 ENCOUNTER — Telehealth: Payer: Self-pay

## 2015-11-04 NOTE — Telephone Encounter (Signed)
noted 

## 2015-11-04 NOTE — Telephone Encounter (Signed)
Received correspondence from [facility] advising the patient was experiencing Diarrhea that could have potentially been caused by the Methotrexate. I called the patient and she stated she only had the diarrhea for one day, she said she had not had any issues since stopping the medication and the dermatologist is going to have her follow up in two weeks. No diarrhea or concerns at this time.        KP

## 2015-11-18 ENCOUNTER — Ambulatory Visit: Payer: Medicare Other | Admitting: Audiology

## 2015-11-26 ENCOUNTER — Other Ambulatory Visit: Payer: Self-pay | Admitting: Family Medicine

## 2015-12-10 ENCOUNTER — Ambulatory Visit: Payer: Medicare Other | Attending: Audiology | Admitting: Audiology

## 2015-12-10 DIAGNOSIS — H918X9 Other specified hearing loss, unspecified ear: Secondary | ICD-10-CM | POA: Insufficient documentation

## 2015-12-10 DIAGNOSIS — H93292 Other abnormal auditory perceptions, left ear: Secondary | ICD-10-CM | POA: Diagnosis present

## 2015-12-10 DIAGNOSIS — H903 Sensorineural hearing loss, bilateral: Secondary | ICD-10-CM | POA: Insufficient documentation

## 2015-12-10 DIAGNOSIS — R292 Abnormal reflex: Secondary | ICD-10-CM | POA: Diagnosis present

## 2015-12-10 DIAGNOSIS — H93293 Other abnormal auditory perceptions, bilateral: Secondary | ICD-10-CM | POA: Insufficient documentation

## 2015-12-10 DIAGNOSIS — H9190 Unspecified hearing loss, unspecified ear: Secondary | ICD-10-CM

## 2015-12-10 NOTE — Procedures (Signed)
Outpatient Audiology and [facility]  [address]  [place], [place] [address]  [phone]   Audiological Evaluation  Patient Name: Carrie Hall   Status: Outpatient   DOB: 15-Nov-1933    Diagnosis: Hearing loss bilateral MRN: [id number] Date:  12/10/2015     Referent: Donato Schultz, DO  History: Carrie Hall was seen for an audiological evaluation. She was accompanied by her sister in law who states that Carrie Hall "turns the TV up so that everyone in the house can hear it" and that Carrie Hall's hearing has become "gradually worse over the past year".  Carrie Hall "failed the hearing screen at the physician's office."   Carrie Hall states that she "hears most people" but not her sister in law "who speaks softly.  Carrie Hall states that she has difficulty with "the loudness of sound".  She denies having tinnitus, balance issues, hypertension or diabetes but does report having hypo thyroidism.    Evaluation: Conventional pure tone audiometry shows symmetrical sensorineural hearing thresholds from 250Hz  - 8000Hz  with using insert earphones that is 30-40 dBHL from 250Hz  - 1000Hz ; 40-50 dBHL from 2000Hz  - 4000Hz  and 60 dBHL at 800Hz .   Reliability is good Speech reception levels (repeating words near threshold) using recorded spondee word lists:  Right ear: 30 dBHL.  Left ear:  40 dBHL Word recognition (at comfortably loud volumes) using recorded word lists at 70 dBHL (MCL), in quiet.  Right ear: 92%%.  Left ear:   76% Word recognition in minimal background noise:  +5 dBHL  Right ear: 50%                              Left ear: 50%  Tympanometry (middle ear function) shows normal middle ear volume, pressure and compliance. Ipsilateral acoustic reflexes are present but elevated bilaterally ranging from 95-100 dBHL from 500Hz  - 4000Hz  bilaterally - except for an absent response at 4000hz  on the left side only.      CONCLUSION:      Carrie Hall has a mild to moderate  sensorineural hearing loss throughout most of the speech range with a moderately severe high frequency hearing loss bilaterally at 8000Hz  only.  No significant recruitment is reported.   It is interesting that Carrie Hall's hearing thresholds improve bilaterally at 6000Hz  only to 20dBHL on the right and 35 dBHL on the left.  This amount of hearing loss will adversely affect speech communication at normal conversational speech levels. Word recognition is excellent in the right ear and fair in the left ear in quiet at conversational speech levels bilaterally. In minimal background noise, word recognition drops to poor bilaterally. Carrie Hall may be an excellent candidate and benefit from amplification; therefore a hearing aid evaluation is recommended.   RECOMMENDATIONS: 1.   Since significant hearing loss has been reported over the past year, please monitor hearing closely with a repeat audiological evaluation in 6 months - earlier if there is a change in hearing.  2.   A hearing aid evaluation. Amplification helps make the signal louder and therefore often improves hearing and word recognition.  Amplification has many forms including hearing aids in one or both ears, an assistive listening device which have a microphone and speaker such as a small handheld device and/or even a surround sound system of speakers.  Amplification may be covered by some insurances, but not all.  It is important to note  that hearing aids must be individually fit according to the hearing test results and the ear shape.  Audiologists and hearing aid dealers in [place] must be licensed in order to dispense hearing aids.  In addition, a trial period is mandated by law in our state because often amplification must be tried and then evaluated in order to determine benefit.  There are many excellent choices when it comes to amplification in our area and providers are listed in the phone book under hearing aids, there are audiologists  in private practice, those affiliated with Ear, Nose and Throat physicians, and there are audiologists located at [facility]. 3.   Strategies that help improve hearing include: A) Face the speaker directly. Optimal is having the speakers face well - lit.  Unless amplified, being within 3-6 feet of the speaker will enhance word recognition. B) Avoid having the speaker back-lit as this will minimize the ability to use cues from lip-reading, facial expression and gestures. C)  Word recognition is poorer in background noise. For optimal word recognition, turn off the TV, radio or noisy fan when engaging in conversation. In a restaurant, try to sit away from noise sources and close to the primary speaker.  D)  Ask for topic clarification from time to time in order to remain in the conversation.  Most people don't mind repeating or clarifying a point when asked.  If needed, explain the difficulty hearing in background noise or hearing loss.   Rashell Shambaugh L. Kate Sable, Au.D., CCC-A Doctor of Audiology 12/10/2015  cc: Donato Schultz, DO

## 2015-12-28 ENCOUNTER — Other Ambulatory Visit: Payer: Self-pay | Admitting: Family Medicine

## 2016-01-05 ENCOUNTER — Other Ambulatory Visit: Payer: Self-pay | Admitting: Family Medicine

## 2016-01-05 DIAGNOSIS — Z853 Personal history of malignant neoplasm of breast: Secondary | ICD-10-CM

## 2016-01-05 DIAGNOSIS — Z1231 Encounter for screening mammogram for malignant neoplasm of breast: Secondary | ICD-10-CM

## 2016-01-21 ENCOUNTER — Ambulatory Visit
Admission: RE | Admit: 2016-01-21 | Discharge: 2016-01-21 | Disposition: A | Discharge status: Home / Self Care | Payer: Medicare Other | Source: Ambulatory Visit | Attending: Family Medicine | Admitting: Family Medicine

## 2016-01-21 DIAGNOSIS — Z853 Personal history of malignant neoplasm of breast: Secondary | ICD-10-CM

## 2016-01-21 DIAGNOSIS — Z1231 Encounter for screening mammogram for malignant neoplasm of breast: Secondary | ICD-10-CM

## 2016-01-27 ENCOUNTER — Other Ambulatory Visit: Payer: Self-pay | Admitting: Family Medicine

## 2016-01-27 DIAGNOSIS — R928 Other abnormal and inconclusive findings on diagnostic imaging of breast: Secondary | ICD-10-CM

## 2016-01-28 ENCOUNTER — Ambulatory Visit (INDEPENDENT_AMBULATORY_CARE_PROVIDER_SITE_OTHER): Payer: Medicare Other | Admitting: Family Medicine

## 2016-01-28 ENCOUNTER — Encounter: Payer: Self-pay | Admitting: Family Medicine

## 2016-01-28 VITALS — BP 144/85 | HR 61 | Resp 18 | Ht 64.0 in | Wt 156.2 lb

## 2016-01-28 DIAGNOSIS — E785 Hyperlipidemia, unspecified: Secondary | ICD-10-CM | POA: Diagnosis not present

## 2016-01-28 DIAGNOSIS — Z23 Encounter for immunization: Secondary | ICD-10-CM

## 2016-01-28 DIAGNOSIS — E538 Deficiency of other specified B group vitamins: Secondary | ICD-10-CM | POA: Diagnosis not present

## 2016-01-28 LAB — LIPID PANEL
CHOLESTEROL: 166 mg/dL (ref 0–200)
HDL: 63.2 mg/dL (ref >39.00)
LDL CALC: 88 mg/dL (ref 0–99)
NonHDL: 103.18
TRIGLYCERIDES: 77 mg/dL (ref 0.0–149.0)
Total CHOL/HDL Ratio: 3
VLDL: 15.4 mg/dL (ref 0.0–40.0)

## 2016-01-28 LAB — COMPREHENSIVE METABOLIC PANEL
ALBUMIN: 4.1 g/dL (ref 3.5–5.2)
ALK PHOS: 52 U/L (ref 39–117)
ALT: 12 U/L (ref 0–35)
AST: 13 U/L (ref 0–37)
BILIRUBIN TOTAL: 0.4 mg/dL (ref 0.2–1.2)
BUN: 19 mg/dL (ref 6–23)
CALCIUM: 9.6 mg/dL (ref 8.4–10.5)
CHLORIDE: 101 meq/L (ref 96–112)
CO2: 35 mEq/L — ABNORMAL HIGH (ref 19–32)
CREATININE: 0.77 mg/dL (ref 0.40–1.20)
GFR: 76.64 mL/min (ref >60.00)
Glucose, Bld: 98 mg/dL (ref 70–99)
Potassium: 4.2 mEq/L (ref 3.5–5.1)
Sodium: 141 mEq/L (ref 135–145)
TOTAL PROTEIN: 7 g/dL (ref 6.0–8.3)

## 2016-01-28 MED ORDER — CYANOCOBALAMIN 1000 MCG/ML IJ SOLN
1000.0000 ug | Freq: Once | INTRAMUSCULAR | Status: AC
  Administered 2016-01-28: 1000 ug via INTRAMUSCULAR

## 2016-01-28 NOTE — Assessment & Plan Note (Signed)
con't fenofibrate and lipitor  Check labs 

## 2016-01-28 NOTE — Progress Notes (Signed)
Pre visit review using our clinic review tool, if applicable. No additional management support is needed unless otherwise documented below in the visit note. 

## 2016-01-28 NOTE — Progress Notes (Signed)
Subjective:    Patient ID: Carrie Hall, female    DOB: 1934/01/09, 82 y.o.   MRN: [id number]  Chief Complaint  Patient presents with  . Hypothyroidism    follow up    HPI Patient is in today for f/u cholesterol not for thyroid and she is asking that we give her b12 shot because her friend that normally does the b12 for her is out with back pain --- she has brought it with her  Past Medical History:  Diagnosis Date  . Adenomatous colon polyp   . Breast cancer ([place])   . COPD (chronic obstructive pulmonary disease) ([place])   . Diverticulosis   . Hyperlipidemia   . Internal hemorrhoids   . Osteopenia   . Thyroid disease     Past Surgical History:  Procedure Laterality Date  . BREAST SURGERY    . colon polyps      Family History  Problem Relation Age of Onset  . Alzheimer's disease Mother   . Cancer Father     lung  . Alzheimer's disease Maternal Grandmother     Social History   Social History  . Marital status: Widowed    Spouse name: N/A  . Number of children: N/A  . Years of education: N/A   Occupational History  . Not on file.   Social History Main Topics  . Smoking status: Former Smoker    Packs/day: 0.50    Types: Cigarettes    Quit date: 12/06/2003  . Smokeless tobacco: Not on file  . Alcohol use No  . Drug use: No  . Sexual activity: No   Other Topics Concern  . Not on file   Social History Narrative  . No narrative on file    Outpatient Medications Prior to Visit  Medication Sig Dispense Refill  . ALPRAZolam (XANAX) 0.25 MG tablet Take 1 tablet (0.25 mg total) by mouth at bedtime as needed for anxiety. 30 tablet 0  . atorvastatin (LIPITOR) 20 MG tablet Take 1 tablet (20 mg total) by mouth daily. 90 tablet 1  . cyanocobalamin (,VITAMIN B-12,) 1000 MCG/ML injection INJECT ONE ML INTO THE MUSCLE ONCE A WEEK FOR 4 WEEKS AND THEN INJECT ONCE A MONTH 1 mL 0  . cyanocobalamin 500 MCG tablet Take 500 mcg by mouth daily.    Marland Kitchen donepezil (ARICEPT) 5  MG tablet Take 1 tablet (5 mg total) by mouth at bedtime. 30 tablet 2  . fenofibrate 160 MG tablet Take 1 tablet (160 mg total) by mouth daily. 90 tablet 1  . folic acid (FOLVITE) 1 MG tablet Take 1 mg by mouth daily.    Marland Kitchen levothyroxine (SYNTHROID, LEVOTHROID) 112 MCG tablet TAKE ONE TABLET BY MOUTH ONCE DAILY BEFORE BREAKFAST 90 tablet 1  . methotrexate (RHEUMATREX) 2.5 MG tablet 3 in the morning and 3 at night  2  . ofloxacin (FLOXIN OTIC) 0.3 % otic solution Place 5 drops into both ears daily. 5 mL 0  . SYRINGE-NEEDLE, DISP, 3 ML (BD ECLIPSE SYRINGE) 25G X 5/8" 3 ML MISC Inject 1 ml of B-12 weekly for 4 weeks and then monthly 16 each 0  . ciprofloxacin (CIPRO) 250 MG tablet Take 1 tablet (250 mg total) by mouth 2 (two) times daily. 6 tablet 0  . cyanocobalamin (,VITAMIN B-12,) 1000 MCG/ML injection Inject 1 mL (1,000 mcg total) into the muscle every 30 (thirty) days. 3 mL 0   No facility-administered medications prior to visit.     No Known  Allergies  Review of Systems  Constitutional: Negative for chills, fever and malaise/fatigue.  HENT: Negative for congestion and hearing loss.   Eyes: Negative for discharge.  Respiratory: Negative for cough, sputum production and shortness of breath.   Cardiovascular: Negative for chest pain, palpitations and leg swelling.  Gastrointestinal: Negative for abdominal pain, blood in stool, constipation, diarrhea, heartburn, nausea and vomiting.  Genitourinary: Negative for dysuria, frequency, hematuria and urgency.  Musculoskeletal: Negative for back pain, falls and myalgias.  Skin: Negative for rash.  Neurological: Negative for dizziness, sensory change, loss of consciousness, weakness and headaches.  Endo/Heme/Allergies: Negative for environmental allergies. Does not bruise/bleed easily.  Psychiatric/Behavioral: Negative for depression and suicidal ideas. The patient is not nervous/anxious and does not have insomnia.        Objective:      Physical Exam  Constitutional: She is oriented to person, place, and time. She appears well-developed and well-nourished.  HENT:  Head: Normocephalic and atraumatic.  Eyes: Conjunctivae and EOM are normal.  Neck: Normal range of motion. Neck supple. No JVD present. Carotid bruit is not present. No thyromegaly present.  Cardiovascular: Normal rate, regular rhythm and normal heart sounds.   No murmur heard. Pulmonary/Chest: Effort normal and breath sounds normal. No respiratory distress. She has no wheezes. She has no rales. She exhibits no tenderness.  Musculoskeletal: She exhibits no edema.  Neurological: She is alert and oriented to person, place, and time.  Psychiatric: She has a normal mood and affect.  Nursing note and vitals reviewed.   BP (!) 144/85 (BP Location: Left Arm, Patient Position: Sitting, Cuff Size: Normal)   Pulse 61   Resp 18   Ht 5\' 4"  (1.626 m)   Wt 156 lb 3.2 oz (70.9 kg)   SpO2 95%   BMI 26.81 kg/m  Wt Readings from Last 3 Encounters:  01/28/16 156 lb 3.2 oz (70.9 kg)  08/25/15 156 lb (70.8 kg)  07/24/15 153 lb 12.8 oz (69.8 kg)     Lab Results  Component Value Date   WBC 6.9 08/25/2015   HGB 13.7 08/25/2015   HCT 41.0 08/25/2015   PLT 303.0 08/25/2015   GLUCOSE 98 01/28/2016   CHOL 166 01/28/2016   TRIG 77.0 01/28/2016   HDL 63.20 01/28/2016   LDLDIRECT 103.3 03/07/2014   LDLCALC 88 01/28/2016   ALT 12 01/28/2016   AST 13 01/28/2016   NA 141 01/28/2016   K 4.2 01/28/2016   CL 101 01/28/2016   CREATININE 0.77 01/28/2016   BUN 19 01/28/2016   CO2 35 (H) 01/28/2016   TSH 1.27 08/25/2015   HGBA1C 6.1 07/20/2012    Lab Results  Component Value Date   TSH 1.27 08/25/2015   Lab Results  Component Value Date   WBC 6.9 08/25/2015   HGB 13.7 08/25/2015   HCT 41.0 08/25/2015   MCV 89.6 08/25/2015   PLT 303.0 08/25/2015   Lab Results  Component Value Date   NA 141 01/28/2016   K 4.2 01/28/2016   CO2 35 (H) 01/28/2016   GLUCOSE 98  01/28/2016   BUN 19 01/28/2016   CREATININE 0.77 01/28/2016   BILITOT 0.4 01/28/2016   ALKPHOS 52 01/28/2016   AST 13 01/28/2016   ALT 12 01/28/2016   PROT 7.0 01/28/2016   ALBUMIN 4.1 01/28/2016   CALCIUM 9.6 01/28/2016   GFR 76.64 01/28/2016   Lab Results  Component Value Date   CHOL 166 01/28/2016   Lab Results  Component Value Date   HDL  63.20 01/28/2016   Lab Results  Component Value Date   LDLCALC 88 01/28/2016   Lab Results  Component Value Date   TRIG 77.0 01/28/2016   Lab Results  Component Value Date   CHOLHDL 3 01/28/2016   Lab Results  Component Value Date   HGBA1C 6.1 07/20/2012       Assessment & Plan:   Problem List Items Addressed This Visit      Unprioritized   Hyperlipidemia LDL goal <100    con't fenofibrate and lipitor Check labs       Other Visit Diagnoses    Hyperlipidemia    -  Primary   Relevant Orders   Lipid panel (Completed)   Comprehensive metabolic panel (Completed)   Encounter for immunization       Relevant Orders   Flu vaccine HIGH DOSE PF (Completed)   Lipid panel (Completed)   Comprehensive metabolic panel (Completed)   B12 deficiency       Relevant Medications   cyanocobalamin ((VITAMIN B-12)) injection 1,000 mcg (Completed)      I have discontinued Ms. Debois's ciprofloxacin. I am also having her maintain her cyanocobalamin, folic acid, methotrexate, levothyroxine, fenofibrate, atorvastatin, ALPRAZolam, ofloxacin, donepezil, SYRINGE-NEEDLE (DISP) 3 ML, and cyanocobalamin. We administered cyanocobalamin.  Meds ordered this encounter  Medications  . cyanocobalamin ((VITAMIN B-12)) injection 1,000 mcg     [name], DO

## 2016-01-28 NOTE — Patient Instructions (Signed)

## 2016-01-29 ENCOUNTER — Ambulatory Visit: Payer: Medicare Other | Admitting: Family Medicine

## 2016-02-01 ENCOUNTER — Ambulatory Visit
Admission: RE | Admit: 2016-02-01 | Discharge: 2016-02-01 | Disposition: A | Discharge status: Home / Self Care | Payer: Medicare Other | Source: Ambulatory Visit | Attending: Family Medicine | Admitting: Family Medicine

## 2016-02-01 DIAGNOSIS — R928 Other abnormal and inconclusive findings on diagnostic imaging of breast: Secondary | ICD-10-CM

## 2016-03-21 ENCOUNTER — Telehealth: Payer: Self-pay | Admitting: Family Medicine

## 2016-03-21 NOTE — Telephone Encounter (Signed)
Caller name: Relationship to patient: Self Can be reached: [phone] Pharmacy:  Reason for call: Patient requesting to resume taking the Vit B12 pills instead of the injections because she no longer has anyone to give her the injections.

## 2016-03-21 NOTE — Telephone Encounter (Signed)
Per pcp it is okay for patient to take Otc vit B12 sublingual, patient states she understands. Patient had no questions or concerns at this time.

## 2016-04-25 ENCOUNTER — Other Ambulatory Visit: Payer: Self-pay | Admitting: Family Medicine

## 2016-04-25 DIAGNOSIS — E785 Hyperlipidemia, unspecified: Secondary | ICD-10-CM

## 2016-05-05 ENCOUNTER — Other Ambulatory Visit: Payer: Self-pay | Admitting: Family Medicine

## 2016-05-05 DIAGNOSIS — E785 Hyperlipidemia, unspecified: Secondary | ICD-10-CM

## 2016-05-05 NOTE — Telephone Encounter (Signed)
Patient request refill on levothyroxine (SYNTHROID, LEVOTHROID) 112 MCG tablet [id number] States she accidentally told the pharmacy the wrong medication.  [place], [place] [phone] (Phone) [phone] (Fax)

## 2016-05-06 NOTE — Telephone Encounter (Signed)
Patient called to follow up on request for refill. Request to speak with providers nurse to find out when the medication will be filled because she is almost out.

## 2016-05-10 NOTE — Telephone Encounter (Signed)
Rxs filled on 05/06/16

## 2016-06-01 DIAGNOSIS — L400 Psoriasis vulgaris: Secondary | ICD-10-CM | POA: Diagnosis not present

## 2016-06-01 DIAGNOSIS — Z79899 Other long term (current) drug therapy: Secondary | ICD-10-CM | POA: Diagnosis not present

## 2016-07-11 ENCOUNTER — Emergency Department
Admission: EM | Admit: 2016-07-11 | Discharge: 2016-07-11 | Disposition: A | Discharge status: Home / Self Care | Payer: Medicare HMO | Source: Home / Self Care | Attending: Family Medicine | Admitting: Family Medicine

## 2016-07-11 ENCOUNTER — Emergency Department (INDEPENDENT_AMBULATORY_CARE_PROVIDER_SITE_OTHER): Payer: Medicare HMO

## 2016-07-11 ENCOUNTER — Encounter: Payer: Self-pay | Admitting: Emergency Medicine

## 2016-07-11 DIAGNOSIS — M65222 Calcific tendinitis, left upper arm: Secondary | ICD-10-CM

## 2016-07-11 DIAGNOSIS — S5002XA Contusion of left elbow, initial encounter: Secondary | ICD-10-CM | POA: Diagnosis not present

## 2016-07-11 DIAGNOSIS — S50312A Abrasion of left elbow, initial encounter: Secondary | ICD-10-CM

## 2016-07-11 NOTE — ED Provider Notes (Signed)
CSN: [id number]     Arrival date & time 07/11/16  0904 History   First MD Initiated Contact with Patient 07/11/16 [phone]     Chief Complaint  Patient presents with  . Fall   (Consider location/radiation/quality/duration/timing/severity/associated sxs/prior Treatment) HPI Carrie Hall is a 83 y.o. female presenting to UC with c/o Left elbow pain and swelling that started about 1 week ago after she tripped and fell going to the bathroom at night, hitting her elbow on a door jam.  She also reports initially having Left shoulder pain and brusing to toes on Left foot but those injuries have resolved. Elbow pain is aching and sore, gradually improving.  she is not on blood thinners.  Denies hitting her head during the fall. Denies passing out.     Past Medical History:  Diagnosis Date  . Adenomatous colon polyp   . Breast cancer ([place])   . COPD (chronic obstructive pulmonary disease) ([place])   . Diverticulosis   . Hyperlipidemia   . Internal hemorrhoids   . Osteopenia   . Thyroid disease    Past Surgical History:  Procedure Laterality Date  . BREAST SURGERY    . colon polyps     Family History  Problem Relation Age of Onset  . Alzheimer's disease Mother   . Cancer Father     lung  . Alzheimer's disease Maternal Grandmother    Social History  Substance Use Topics  . Smoking status: Former Smoker    Packs/day: 0.50    Types: Cigarettes    Quit date: 12/06/2003  . Smokeless tobacco: Never Used  . Alcohol use No   OB History    No data available     Review of Systems  Musculoskeletal: Positive for arthralgias, joint swelling and myalgias.       Left elbow  Skin: Positive for color change and wound. Negative for rash.  Neurological: Negative for weakness and numbness.    Allergies  Patient has no known allergies.  Home Medications   Prior to Admission medications   Medication Sig Start Date End Date Taking? Authorizing Provider  ALPRAZolam (XANAX) 0.25 MG tablet Take 1  tablet (0.25 mg total) by mouth at bedtime as needed for anxiety. 07/24/15   Rosalita Chessman Chase, DO  atorvastatin (LIPITOR) 20 MG tablet TAKE ONE TABLET BY MOUTH ONCE DAILY 05/06/16   Rosalita Chessman Chase, DO  cyanocobalamin (,VITAMIN B-12,) 1000 MCG/ML injection INJECT ONE ML INTO THE MUSCLE ONCE A WEEK FOR 4 WEEKS AND THEN INJECT ONCE A MONTH 11/26/15   Alferd Apa Lowne Chase, DO  cyanocobalamin 500 MCG tablet Take 500 mcg by mouth daily.    Historical Provider, MD  donepezil (ARICEPT) 5 MG tablet Take 1 tablet (5 mg total) by mouth at bedtime. 08/25/15   Rosalita Chessman Chase, DO  fenofibrate 160 MG tablet TAKE ONE TABLET BY MOUTH ONCE DAILY 04/25/16   Rosalita Chessman Chase, DO  folic acid (FOLVITE) 1 MG tablet Take 1 mg by mouth daily.    Historical Provider, MD  levothyroxine (SYNTHROID, LEVOTHROID) 112 MCG tablet TAKE ONE TABLET BY MOUTH ONCE DAILY BEFORE BREAKFAST 07/24/15   Rosalita Chessman Chase, DO  levothyroxine (SYNTHROID, LEVOTHROID) 112 MCG tablet TAKE ONE TABLET BY MOUTH ONCE DAILY BEFORE BREAKFAST 05/06/16   Alferd Apa Lowne Chase, DO  methotrexate (RHEUMATREX) 2.5 MG tablet 3 in the morning and 3 at night 06/30/15   Historical Provider, MD  ofloxacin (FLOXIN OTIC) 0.3 % otic  solution Place 5 drops into both ears daily. 08/25/15   Alferd Apa Lowne Chase, DO  SYRINGE-NEEDLE, DISP, 3 ML (BD ECLIPSE SYRINGE) 25G X 5/8" 3 ML MISC Inject 1 ml of B-12 weekly for 4 weeks and then monthly 08/31/15   [name], DO   Meds Ordered and Administered this Visit  Medications - No data to display  BP 108/68 (BP Location: Left Arm)   Pulse 67   Temp 97.8 F (36.6 C) (Oral)   Ht 5\' 4"  (1.626 m)   Wt 155 lb (70.3 kg)   SpO2 94%   BMI 26.61 kg/m  No data found.   Physical Exam  Constitutional: She is oriented to person, place, and time. She appears well-developed and well-nourished.  HENT:  Head: Normocephalic and atraumatic.  Eyes: EOM are normal.  Neck: Normal range of motion.   Cardiovascular: Normal rate.   Pulmonary/Chest: Effort normal.  Musculoskeletal: Normal range of motion. She exhibits edema and tenderness.  Left arm: full ROM shoulder, elbow and wrist. Mild edema over olecranon process, mildly tender. No crepitus.  Neurological: She is alert and oriented to person, place, and time.  Skin: Skin is warm and dry. Capillary refill takes less than 2 seconds.  Left elbow: 1cm well healing abrasion over olecranon process.  0.5cm area of erythematous dried skin (chronic per pt, psoriasis).  No surrounding erythema or warmth. No bleeding or drainage.  Left proximal forearm, medial aspect: faint ecchymosis, non-tender. No induration or fluctuance.   Psychiatric: She has a normal mood and affect. Her behavior is normal.  Nursing note and vitals reviewed.   Urgent Care Course     Procedures (including critical care time)  Labs Review Labs Reviewed - No data to display  Imaging Review Dg Elbow Complete Left  Result Date: 07/11/2016 CLINICAL DATA:  Pain and bruising after blunt trauma 1 week ago. EXAM: LEFT ELBOW - COMPLETE 3+ VIEW COMPARISON:  None. FINDINGS: No fracture or dislocation or joint effusion. There is soft tissue swelling in the region of the olecranon bursa with calcific tendinopathy of the distal triceps tendon. IMPRESSION: Probable olecranon bursitis. Adjacent triceps calcific tendinopathy. Electronically Signed   By: Lorriane Shire M.D.   On: 07/11/2016 10:21     MDM   1. Abrasion of left elbow, initial encounter   2. Contusion of left elbow, initial encounter    Abrasion, contusion, and mild edema of Left elbow. No evidence of fracture or dislocation.  Bacitracin and bandage applied. Offered ace wrap. Pt declined Home care instructions provided.  F/u with PCP or Dr. Georgina Snell, Sports Medicine, in 1-2 weeks if not improving, sooner if worsening.    Noland Fordyce, PA-C 07/11/16 1112

## 2016-07-11 NOTE — ED Triage Notes (Signed)
Left elbow injury x 1 week ago. [place] going to bathroom in the middle of the night hitting elbow on door jam, small abrasion and painful.

## 2016-07-15 ENCOUNTER — Encounter (HOSPITAL_BASED_OUTPATIENT_CLINIC_OR_DEPARTMENT_OTHER): Payer: Self-pay | Admitting: Emergency Medicine

## 2016-07-15 ENCOUNTER — Emergency Department (HOSPITAL_BASED_OUTPATIENT_CLINIC_OR_DEPARTMENT_OTHER)
Admission: EM | Admit: 2016-07-15 | Discharge: 2016-07-15 | Disposition: A | Discharge status: Home / Self Care | Payer: Medicare HMO | Attending: Emergency Medicine | Admitting: Emergency Medicine

## 2016-07-15 ENCOUNTER — Emergency Department (HOSPITAL_BASED_OUTPATIENT_CLINIC_OR_DEPARTMENT_OTHER): Payer: Medicare HMO

## 2016-07-15 DIAGNOSIS — J449 Chronic obstructive pulmonary disease, unspecified: Secondary | ICD-10-CM | POA: Diagnosis not present

## 2016-07-15 DIAGNOSIS — J40 Bronchitis, not specified as acute or chronic: Secondary | ICD-10-CM | POA: Diagnosis not present

## 2016-07-15 DIAGNOSIS — E039 Hypothyroidism, unspecified: Secondary | ICD-10-CM | POA: Insufficient documentation

## 2016-07-15 DIAGNOSIS — R509 Fever, unspecified: Secondary | ICD-10-CM | POA: Diagnosis not present

## 2016-07-15 DIAGNOSIS — R0602 Shortness of breath: Secondary | ICD-10-CM | POA: Diagnosis not present

## 2016-07-15 DIAGNOSIS — Z79899 Other long term (current) drug therapy: Secondary | ICD-10-CM | POA: Insufficient documentation

## 2016-07-15 DIAGNOSIS — Z87891 Personal history of nicotine dependence: Secondary | ICD-10-CM | POA: Diagnosis not present

## 2016-07-15 DIAGNOSIS — Z853 Personal history of malignant neoplasm of breast: Secondary | ICD-10-CM | POA: Diagnosis not present

## 2016-07-15 LAB — I-STAT CG4 LACTIC ACID, ED: LACTIC ACID, VENOUS: 0.79 mmol/L (ref 0.5–1.9)

## 2016-07-15 MED ORDER — AZITHROMYCIN 250 MG PO TABS: ORAL_TABLET | ORAL | 0 refills | Status: DC

## 2016-07-15 MED ORDER — BENZONATATE 100 MG PO CAPS: 100.0000 mg | ORAL_CAPSULE | Freq: Three times a day (TID) | ORAL | 0 refills | Status: DC

## 2016-07-15 MED ORDER — LEVALBUTEROL HCL 0.63 MG/3ML IN NEBU
0.6300 mg | INHALATION_SOLUTION | Freq: Once | RESPIRATORY_TRACT | Status: AC
Start: 2016-07-15 — End: 2016-07-15
  Administered 2016-07-15: 0.63 mg via RESPIRATORY_TRACT
  Filled 2016-07-15: qty 3

## 2016-07-15 MED ORDER — IPRATROPIUM BROMIDE 0.02 % IN SOLN
0.5000 mg | Freq: Once | RESPIRATORY_TRACT | Status: AC
  Administered 2016-07-15: 0.5 mg via RESPIRATORY_TRACT
  Filled 2016-07-15: qty 2.5

## 2016-07-15 MED ORDER — ALBUTEROL SULFATE HFA 108 (90 BASE) MCG/ACT IN AERS: 1.0000 | INHALATION_SPRAY | Freq: Four times a day (QID) | RESPIRATORY_TRACT | 0 refills | Status: DC | PRN

## 2016-07-15 NOTE — ED Triage Notes (Signed)
Patient states that she woke up feeling SOB and "sick" on wed. The patient has labored breathing in triage

## 2016-07-15 NOTE — ED Notes (Signed)
ED Provider at bedside. 

## 2016-07-15 NOTE — ED Provider Notes (Signed)
[facility] Provider Note   CSN: [id number] Arrival date & time: 07/15/16  2044  By signing my name below, I, Carrie Hall, attest that this documentation has been prepared under the direction and in the presence of Tanna Furry, MD. Electronically Signed: Gwenlyn Hall, ED Scribe. 07/15/16. 9:31 PM.   History   Chief Complaint Chief Complaint  Patient presents with  . Shortness of Breath   The history is provided by the patient. No language interpreter was used.   HPI Comments: Carrie Hall is a 83 y.o. female with PMHx of COPD who presents to the Emergency Department complaining of gradual onset, constant, moderate shortness of breath beginning 2 days ago. She reports associated chest congestion, wheezing, and productive cough. Pt does not have an inhaler at home, but states the breathing treatment in ED provides significant relief to symptoms. Denies PMHx of A-fib.Pt denies fever, chest pain, and pain with cough.  Past Medical History:  Diagnosis Date  . Adenomatous colon polyp   . Breast cancer ([place])   . COPD (chronic obstructive pulmonary disease) ([place])   . Diverticulosis   . Hyperlipidemia   . Internal hemorrhoids   . Osteopenia   . Thyroid disease     Patient Active Problem List   Diagnosis Date Noted  . Cerumen impaction 08/25/2015  . 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  . Memory loss 08/25/2015  . Diarrhea 12/19/2014  . KNEE PAIN, LEFT 05/27/2010  . DYSURIA 05/27/2010  . POSTMENOPAUSAL STATUS 04/23/2010  . CERVICAL POLYP 10/26/2009  . HEMATURIA, HX OF 10/26/2009  . DIZZINESS 06/11/2009  . FOOT, PAIN 11/17/2008  . OTHER ABNORMAL BLOOD CHEMISTRY 11/17/2008  . TINEA CRURIS 09/15/2008  . UTI 09/15/2008  . COLONIC POLYPS, ADENOMATOUS, HX OF 01/16/2008  . HEMOCCULT POSITIVE STOOL 12/11/2007  . BREAST CANCER, HX OF 12/11/2007  . Hypothyroidism 12/07/2007  . Hyperlipidemia LDL goal <100 12/07/2007  . COPD 12/07/2007  . Disorder of bone and articular  cartilage 12/07/2007  . URI 03/13/2007  . HX, URINARY INFECTION 01/09/2007    Past Surgical History:  Procedure Laterality Date  . BREAST SURGERY    . colon polyps      OB History    No data available       Home Medications    Prior to Admission medications   Medication Sig Start Date End Date Taking? Authorizing Provider  albuterol (PROVENTIL HFA;VENTOLIN HFA) 108 (90 Base) MCG/ACT inhaler Inhale 1-2 puffs into the lungs every 6 (six) hours as needed for wheezing. 07/15/16   Tanna Furry, MD  ALPRAZolam Duanne Moron) 0.25 MG tablet Take 1 tablet (0.25 mg total) by mouth at bedtime as needed for anxiety. 07/24/15   Rosalita Chessman Chase, DO  atorvastatin (LIPITOR) 20 MG tablet TAKE ONE TABLET BY MOUTH ONCE DAILY 05/06/16   Rosalita Chessman Chase, DO  azithromycin (ZITHROMAX Z-PAK) 250 MG tablet 6 tabs over 5 days as directed 07/15/16   Tanna Furry, MD  benzonatate (TESSALON) 100 MG capsule Take 1 capsule (100 mg total) by mouth every 8 (eight) hours. 07/15/16   Tanna Furry, MD  cyanocobalamin (,VITAMIN B-12,) 1000 MCG/ML injection INJECT ONE ML INTO THE MUSCLE ONCE A WEEK FOR 4 WEEKS AND THEN INJECT ONCE A MONTH 11/26/15   Alferd Apa Lowne Chase, DO  cyanocobalamin 500 MCG tablet Take 500 mcg by mouth daily.    Historical Provider, MD  donepezil (ARICEPT) 5 MG tablet Take 1 tablet (5 mg total) by mouth at bedtime. 08/25/15  Alferd Apa Lowne Chase, DO  fenofibrate 160 MG tablet TAKE ONE TABLET BY MOUTH ONCE DAILY 04/25/16   Rosalita Chessman Chase, DO  folic acid (FOLVITE) 1 MG tablet Take 1 mg by mouth daily.    Historical Provider, MD  levothyroxine (SYNTHROID, LEVOTHROID) 112 MCG tablet TAKE ONE TABLET BY MOUTH ONCE DAILY BEFORE BREAKFAST 07/24/15   Rosalita Chessman Chase, DO  levothyroxine (SYNTHROID, LEVOTHROID) 112 MCG tablet TAKE ONE TABLET BY MOUTH ONCE DAILY BEFORE BREAKFAST 05/06/16   Alferd Apa Lowne Chase, DO  methotrexate (RHEUMATREX) 2.5 MG tablet 3 in the morning and 3 at night 06/30/15   Historical  Provider, MD  ofloxacin (FLOXIN OTIC) 0.3 % otic solution Place 5 drops into both ears daily. 08/25/15   Rosalita Chessman Chase, DO  SYRINGE-NEEDLE, DISP, 3 ML (BD ECLIPSE SYRINGE) 25G X 5/8" 3 ML MISC Inject 1 ml of B-12 weekly for 4 weeks and then monthly 08/31/15   [name], DO    Family History Family History  Problem Relation Age of Onset  . Alzheimer's disease Mother   . Cancer Father     lung  . Alzheimer's disease Maternal Grandmother     Social History Social History  Substance Use Topics  . Smoking status: Former Smoker    Packs/day: 0.50    Types: Cigarettes    Quit date: 12/06/2003  . Smokeless tobacco: Never Used  . Alcohol use No     Allergies   Patient has no known allergies.   Review of Systems Review of Systems  Constitutional: Negative for appetite change, chills, diaphoresis, fatigue and fever.  HENT: Positive for congestion. Negative for mouth sores, sore throat and trouble swallowing.   Eyes: Negative for visual disturbance.  Respiratory: Positive for cough, shortness of breath and wheezing. Negative for chest tightness.   Cardiovascular: Negative for chest pain.  Gastrointestinal: Negative for abdominal distention, abdominal pain, diarrhea, nausea and vomiting.  Endocrine: Negative for polydipsia, polyphagia and polyuria.  Genitourinary: Negative for dysuria, frequency and hematuria.  Musculoskeletal: Negative for gait problem.  Skin: Negative for color change, pallor and rash.  Neurological: Negative for dizziness, syncope, light-headedness and headaches.  Hematological: Does not bruise/bleed easily.  Psychiatric/Behavioral: Negative for behavioral problems and confusion.     Physical Exam Updated Vital Signs BP 129/67 (BP Location: Left Arm)   Pulse 68   Temp 98.4 F (36.9 C) (Oral)   Resp 20   Ht 5\' 4"  (1.626 m)   Wt 155 lb (70.3 kg)   SpO2 96%   BMI 26.61 kg/m   Physical Exam  Constitutional: She is oriented to person,  place, and time. She appears well-developed and well-nourished. No distress.  HENT:  Head: Normocephalic.  Eyes: Conjunctivae are normal. Pupils are equal, round, and reactive to light. No scleral icterus.  Neck: Normal range of motion. Neck supple. No thyromegaly present.  Cardiovascular: Normal rate and regular rhythm.  Exam reveals no gallop and no friction rub.   No murmur heard. Irregular. Sinus rhythm with frequent PACs on the monitor.  Pulmonary/Chest: Effort normal and breath sounds normal. No respiratory distress. She has no wheezes. She has no rales.  Abdominal: Soft. Bowel sounds are normal. She exhibits no distension. There is no tenderness. There is no rebound.  Musculoskeletal: Normal range of motion.  Neurological: She is alert and oriented to person, place, and time.  Skin: Skin is warm and dry. No rash noted.  Psychiatric: She has a normal mood and  affect. Her behavior is normal.     ED Treatments / Results  DIAGNOSTIC STUDIES: Oxygen Saturation is 94% on RA, adequate by my interpretation.    COORDINATION OF CARE: 9:28 PM Discussed treatment plan with pt at bedside which includes DG Chest and lab work and pt agreed to plan.  Labs (all labs ordered are listed, but only abnormal results are displayed) Labs Reviewed  CBC WITH DIFFERENTIAL/PLATELET  BASIC METABOLIC PANEL  LACTIC ACID, PLASMA  LACTIC ACID, PLASMA  I-STAT CG4 LACTIC ACID, ED    EKG  EKG Interpretation  Date/Time:  Friday July 15 2016 21:02:36 EST Ventricular Rate:  93 PR Interval:    QRS Duration: 94 QT Interval:  394 QTC Calculation: 489 R Axis:   78 Text Interpretation:  sinus Rhythm with frequent PACs Nonspecific ST abnormality Abnormal ECG Confirmed by Jeneen Rinks  MD, [place] ([id number]) on 07/15/2016 9:21:34 PM       Radiology Dg Chest 2 View  Result Date: 07/15/2016 CLINICAL DATA:  Low grade fever EXAM: CHEST  2 VIEW COMPARISON:  01/07/2010 FINDINGS: Hyperinflation. No focal pulmonary infiltrate  or effusion. Stable cardiomediastinal silhouette. No pneumothorax. IMPRESSION: Hyperinflation without focal infiltrate Electronically Signed   By: Donavan Foil M.D.   On: 07/15/2016 21:43    Procedures Procedures (including critical care time)  Medications Ordered in ED Medications  levalbuterol (XOPENEX) nebulizer solution 0.63 mg (0.63 mg Nebulization Given 07/15/16 2117)  ipratropium (ATROVENT) nebulizer solution 0.5 mg (0.5 mg Nebulization Given 07/15/16 2117)     Initial Impression / Assessment and Plan / ED Course  I have reviewed the triage vital signs and the nursing notes.  Pertinent labs & imaging results that were available during my care of the patient were reviewed by me and considered in my medical decision making (see chart for details).     Patient asymptomatic after nebulized beta agonist. EKG and monitor show sinus with frequent PACs. No PVCs or other ectopy noted. Chest x-ray shows no infiltrates or effusion. Clear lungs and oxygenating at 93%. Discharged on Zithromax, Tessalon, albuterol inhaler. Primary care follow-up if not improving.  Final Clinical Impressions(s) / ED Diagnoses   Final diagnoses:  Bronchitis    New Prescriptions New Prescriptions   ALBUTEROL (PROVENTIL HFA;VENTOLIN HFA) 108 (90 BASE) MCG/ACT INHALER    Inhale 1-2 puffs into the lungs every 6 (six) hours as needed for wheezing.   AZITHROMYCIN (ZITHROMAX Z-PAK) 250 MG TABLET    6 tabs over 5 days as directed   BENZONATATE (TESSALON) 100 MG CAPSULE    Take 1 capsule (100 mg total) by mouth every 8 (eight) hours.     Tanna Furry, MD 07/15/16 2320

## 2016-07-15 NOTE — Discharge Instructions (Signed)
Follow up with primary care physician if not improving.

## 2016-07-15 NOTE — ED Notes (Signed)
Pt returned from xray

## 2016-07-22 ENCOUNTER — Encounter: Payer: Self-pay | Admitting: Medical

## 2016-07-22 ENCOUNTER — Ambulatory Visit (INDEPENDENT_AMBULATORY_CARE_PROVIDER_SITE_OTHER): Payer: Medicare HMO | Admitting: Medical

## 2016-07-22 VITALS — BP 110/68 | HR 70 | Temp 98.0°F | Resp 16 | Ht 64.0 in | Wt 153.4 lb

## 2016-07-22 DIAGNOSIS — Z87891 Personal history of nicotine dependence: Secondary | ICD-10-CM | POA: Diagnosis not present

## 2016-07-22 DIAGNOSIS — J40 Bronchitis, not specified as acute or chronic: Secondary | ICD-10-CM | POA: Diagnosis not present

## 2016-07-22 DIAGNOSIS — R059 Cough, unspecified: Secondary | ICD-10-CM

## 2016-07-22 DIAGNOSIS — I491 Atrial premature depolarization: Secondary | ICD-10-CM

## 2016-07-22 DIAGNOSIS — Z79899 Other long term (current) drug therapy: Secondary | ICD-10-CM | POA: Diagnosis not present

## 2016-07-22 DIAGNOSIS — R05 Cough: Secondary | ICD-10-CM

## 2016-07-22 MED ORDER — FLUTICASONE PROPIONATE HFA 110 MCG/ACT IN AERO: 2.0000 | INHALATION_SPRAY | Freq: Two times a day (BID) | RESPIRATORY_TRACT | 2 refills | Status: DC

## 2016-07-22 MED ORDER — CEPHALEXIN 500 MG PO CAPS: 500.0000 mg | ORAL_CAPSULE | Freq: Two times a day (BID) | ORAL | 0 refills | Status: DC

## 2016-07-22 NOTE — Progress Notes (Signed)
Subjective:    Patient ID: Carrie Hall, female    DOB: [birth year], 83 y.o.   MRN: [id number]  HPI  Pt in states she is feeling better. She was seen in the ED for bronchitis. Pt states feels about 70-75% better. Late in evening feels tired. Still coughing up mucous. Pt states can sleep. Pt reports no wheezing. No fever, no chills and no sweats. No bodyaches.  Pt was given azithromycin 5 day antibiotic(now finished) and benzonatate. Pt has benzonatate since not using.  Pt states using albuterol twice a day. She feels like it helps although feels like she is not wheezing. Pt has used in past before.   Pt stopped smoking 83 yo. Pt did smoke pack a day until 83 yo.    Review of Systems  HENT: Negative for congestion, nosebleeds, postnasal drip, rhinorrhea, sinus pain, sinus pressure, sneezing and sore throat.   Respiratory: Positive for cough and shortness of breath. Negative for wheezing.        No wheeze but states albuterol helps so some sob likeley  Cardiovascular: Negative for chest pain and palpitations.  Gastrointestinal: Negative for abdominal pain.  Genitourinary: Negative for dysuria, enuresis, menstrual problem and vaginal pain.  Musculoskeletal: Negative for back pain.  Skin: Negative for rash.  Psychiatric/Behavioral: Negative for agitation and confusion.   Past Medical History:  Diagnosis Date  . Adenomatous colon polyp   . Breast cancer ([place])   . COPD (chronic obstructive pulmonary disease) ([place])   . Diverticulosis   . Hyperlipidemia   . Internal hemorrhoids   . Osteopenia   . Thyroid disease      Social History   Social History  . Marital status: Widowed    Spouse name: N/A  . Number of children: N/A  . Years of education: N/A   Occupational History  . Not on file.   Social History Main Topics  . Smoking status: Former Smoker    Packs/day: 0.50    Types: Cigarettes    Quit date: 12/06/2003  . Smokeless tobacco: Never Used  . Alcohol use No  .  Drug use: No  . Sexual activity: No   Other Topics Concern  . Not on file   Social History Narrative  . No narrative on file    Past Surgical History:  Procedure Laterality Date  . BREAST SURGERY    . colon polyps      Family History  Problem Relation Age of Onset  . Alzheimer's disease Mother   . Cancer Father     lung  . Alzheimer's disease Maternal Grandmother     No Known Allergies  Current Outpatient Prescriptions on File Prior to Visit  Medication Sig Dispense Refill  . albuterol (PROVENTIL HFA;VENTOLIN HFA) 108 (90 Base) MCG/ACT inhaler Inhale 1-2 puffs into the lungs every 6 (six) hours as needed for wheezing. 1 Inhaler 0  . ALPRAZolam (XANAX) 0.25 MG tablet Take 1 tablet (0.25 mg total) by mouth at bedtime as needed for anxiety. 30 tablet 0  . atorvastatin (LIPITOR) 20 MG tablet TAKE ONE TABLET BY MOUTH ONCE DAILY 90 tablet 1  . benzonatate (TESSALON) 100 MG capsule Take 1 capsule (100 mg total) by mouth every 8 (eight) hours. 21 capsule 0  . cyanocobalamin 500 MCG tablet Take 500 mcg by mouth daily.    Marland Kitchen donepezil (ARICEPT) 5 MG tablet Take 1 tablet (5 mg total) by mouth at bedtime. 30 tablet 2  . fenofibrate 160 MG tablet TAKE ONE TABLET  BY MOUTH ONCE DAILY 90 tablet 1  . folic acid (FOLVITE) 1 MG tablet Take 1 mg by mouth daily.    Marland Kitchen levothyroxine (SYNTHROID, LEVOTHROID) 112 MCG tablet TAKE ONE TABLET BY MOUTH ONCE DAILY BEFORE BREAKFAST 90 tablet 1   No current facility-administered medications on file prior to visit.     BP 110/68 (BP Location: Right Arm, Patient Position: Sitting, Cuff Size: Large)   Pulse 70   Temp 98 F (36.7 C) (Oral)   Resp 16   Ht 5\' 4"  (1.626 m)   Wt 153 lb 6 oz (69.6 kg)   SpO2 94%   BMI 26.33 kg/m       Objective:   Physical Exam  General  Mental Status - Alert. General Appearance - Well groomed. Not in acute distress.  Skin Rashes- No Rashes.  HEENT Head- Normal. Ear Auditory Canal - Left- Normal. Right -  Normal.Tympanic Membrane- Left- Normal. Right- Normal. Eye Sclera/Conjunctiva- Left- Normal. Right- Normal. Nose & Sinuses Nasal Mucosa- Left-  Boggy and Congested. Right-  Boggy and  Congested.Bilateral maxillary and frontal sinus pressure. Mouth & Throat Lips: Upper Lip- Normal: no dryness, cracking, pallor, cyanosis, or vesicular eruption. Lower Lip-Normal: no dryness, cracking, pallor, cyanosis or vesicular eruption. Buccal Mucosa- Bilateral- No Aphthous ulcers. Oropharynx- No Discharge or Erythema. Tonsils: Characteristics- Bilateral- No Erythema or Congestion. Size/Enlargement- Bilateral- No enlargement. Discharge- bilateral-None.  Neck Neck- Supple. No Masses.   Chest and Lung Exam Auscultation: Breath Sounds:-Clear even and unlabored.  Cardiovascular Auscultation:Rythm- Regular, rate and rhythm. Murmurs & Other Heart Sounds:Ausculatation of the heart reveal- No Murmurs.  Lymphatic Head & Neck General Head & Neck Lymphatics: Bilateral: Description- No Localized lymphadenopathy.      Assessment & Plan:  You appear to have mild persisting bronchitis. Rest hydrate and tylenol for fever. Use your benzonatate cough medicine, and will write new antibiotic keflex  For wheezing continue albuterol and adding flovent inhaler.  Ekg repeat today Some mild bradycardia but no skipped beats. No atiral fibrillation. I am not convinced has pac.  Follow up in 7-10 days or as needed

## 2016-07-22 NOTE — Progress Notes (Signed)
Pre visit review using our clinic review tool, if applicable. No additional management support is needed unless otherwise documented below in the visit note/SLS  

## 2016-07-22 NOTE — Patient Instructions (Addendum)
You appear to have mild persisting bronchitis. Rest hydrate and tylenol for fever. Use your benzonatate cough medicine, and will write new antibiotic keflex  For wheezing continue albuterol and adding flovent inhaler.  Ekg repeat today   Follow up in 7-10 days or as needed

## 2016-08-31 DIAGNOSIS — Z9181 History of falling: Secondary | ICD-10-CM | POA: Diagnosis not present

## 2016-08-31 DIAGNOSIS — Z79890 Hormone replacement therapy (postmenopausal): Secondary | ICD-10-CM | POA: Diagnosis not present

## 2016-08-31 DIAGNOSIS — R0600 Dyspnea, unspecified: Secondary | ICD-10-CM | POA: Diagnosis not present

## 2016-08-31 DIAGNOSIS — E785 Hyperlipidemia, unspecified: Secondary | ICD-10-CM | POA: Diagnosis not present

## 2016-08-31 DIAGNOSIS — E039 Hypothyroidism, unspecified: Secondary | ICD-10-CM | POA: Diagnosis not present

## 2016-08-31 DIAGNOSIS — K08409 Partial loss of teeth, unspecified cause, unspecified class: Secondary | ICD-10-CM | POA: Diagnosis not present

## 2016-08-31 DIAGNOSIS — H9113 Presbycusis, bilateral: Secondary | ICD-10-CM | POA: Diagnosis not present

## 2016-08-31 DIAGNOSIS — Z0000 Encounter for general adult medical examination without abnormal findings: Secondary | ICD-10-CM | POA: Diagnosis not present

## 2016-08-31 DIAGNOSIS — G4700 Insomnia, unspecified: Secondary | ICD-10-CM | POA: Diagnosis not present

## 2016-08-31 DIAGNOSIS — G3184 Mild cognitive impairment, so stated: Secondary | ICD-10-CM | POA: Diagnosis not present

## 2016-08-31 DIAGNOSIS — L409 Psoriasis, unspecified: Secondary | ICD-10-CM | POA: Diagnosis not present

## 2016-09-14 DIAGNOSIS — Z79899 Other long term (current) drug therapy: Secondary | ICD-10-CM | POA: Diagnosis not present

## 2016-09-14 DIAGNOSIS — L400 Psoriasis vulgaris: Secondary | ICD-10-CM | POA: Diagnosis not present

## 2016-09-23 DIAGNOSIS — L400 Psoriasis vulgaris: Secondary | ICD-10-CM | POA: Diagnosis not present

## 2016-09-23 DIAGNOSIS — Z79899 Other long term (current) drug therapy: Secondary | ICD-10-CM | POA: Diagnosis not present

## 2016-10-20 ENCOUNTER — Other Ambulatory Visit: Payer: Self-pay | Admitting: Family Medicine

## 2016-10-20 DIAGNOSIS — E785 Hyperlipidemia, unspecified: Secondary | ICD-10-CM

## 2017-01-17 ENCOUNTER — Telehealth: Payer: Self-pay | Admitting: Family Medicine

## 2017-01-17 DIAGNOSIS — F411 Generalized anxiety disorder: Secondary | ICD-10-CM

## 2017-01-17 MED ORDER — ALPRAZOLAM 0.25 MG PO TABS: 0.2500 mg | ORAL_TABLET | Freq: Every evening | ORAL | 0 refills | Status: DC | PRN
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
## 2017-01-17 NOTE — Telephone Encounter (Signed)
Rx was signed and faxed to pharmacy. Awaiting successful confirmation

## 2017-01-17 NOTE — Telephone Encounter (Signed)
Patient checking on the status of medication request, please advise

## 2017-01-17 NOTE — Telephone Encounter (Signed)
Rx printed and awaiting sig. Pt called back, informed her I will fax to pharmacy

## 2017-01-17 NOTE — Telephone Encounter (Signed)
Received successful fax. Nothing further is needed

## 2017-01-17 NOTE — Telephone Encounter (Signed)
Ok to refill xanax 

## 2017-02-15 DIAGNOSIS — Z79899 Other long term (current) drug therapy: Secondary | ICD-10-CM | POA: Diagnosis not present

## 2017-02-15 DIAGNOSIS — L400 Psoriasis vulgaris: Secondary | ICD-10-CM | POA: Diagnosis not present

## 2017-02-21 ENCOUNTER — Other Ambulatory Visit: Payer: Self-pay | Admitting: Family Medicine

## 2017-02-21 DIAGNOSIS — Z1231 Encounter for screening mammogram for malignant neoplasm of breast: Secondary | ICD-10-CM

## 2017-02-22 ENCOUNTER — Ambulatory Visit (INDEPENDENT_AMBULATORY_CARE_PROVIDER_SITE_OTHER): Payer: Medicare HMO

## 2017-02-22 DIAGNOSIS — Z23 Encounter for immunization: Secondary | ICD-10-CM | POA: Diagnosis not present

## 2017-03-02 DIAGNOSIS — Z79899 Other long term (current) drug therapy: Secondary | ICD-10-CM | POA: Diagnosis not present

## 2017-03-10 ENCOUNTER — Ambulatory Visit: Payer: Medicare HMO

## 2017-03-13 ENCOUNTER — Ambulatory Visit
Admission: RE | Admit: 2017-03-13 | Discharge: 2017-03-13 | Disposition: A | Discharge status: Home / Self Care | Payer: Medicare HMO | Source: Ambulatory Visit | Attending: Family Medicine | Admitting: Family Medicine

## 2017-03-13 DIAGNOSIS — Z1231 Encounter for screening mammogram for malignant neoplasm of breast: Secondary | ICD-10-CM

## 2017-04-17 ENCOUNTER — Encounter: Payer: Medicare HMO | Admitting: Family Medicine

## 2017-04-19 ENCOUNTER — Other Ambulatory Visit: Payer: Self-pay | Admitting: Family Medicine

## 2017-04-19 NOTE — Telephone Encounter (Signed)
Copied from [place] [id number]. Topic: Quick Communication - See Telephone Encounter >> Apr 19, 2017  9:13 AM Synthia Innocent wrote: CRM for notification. See Telephone encounter for: Requesting refill on levothyroxine (SYNTHROID, LEVOTHROID) 112 MCG tablet, already called pharmacy, [facility] [address] 04/19/17.

## 2017-05-20 ENCOUNTER — Other Ambulatory Visit: Payer: Self-pay | Admitting: Family Medicine

## 2017-05-20 DIAGNOSIS — E785 Hyperlipidemia, unspecified: Secondary | ICD-10-CM

## 2017-07-23 DIAGNOSIS — R358 Other polyuria: Secondary | ICD-10-CM | POA: Diagnosis not present

## 2017-08-07 ENCOUNTER — Encounter: Payer: Self-pay | Admitting: Family Medicine

## 2017-08-07 ENCOUNTER — Ambulatory Visit (INDEPENDENT_AMBULATORY_CARE_PROVIDER_SITE_OTHER): Payer: Medicare HMO | Admitting: Family Medicine

## 2017-08-07 VITALS — BP 134/70 | HR 72 | Temp 97.6°F | Resp 16 | Wt 139.6 lb

## 2017-08-07 DIAGNOSIS — N6459 Other signs and symptoms in breast: Secondary | ICD-10-CM | POA: Diagnosis not present

## 2017-08-07 DIAGNOSIS — E2839 Other primary ovarian failure: Secondary | ICD-10-CM | POA: Diagnosis not present

## 2017-08-07 DIAGNOSIS — R413 Other amnesia: Secondary | ICD-10-CM

## 2017-08-07 DIAGNOSIS — R634 Abnormal weight loss: Secondary | ICD-10-CM

## 2017-08-07 DIAGNOSIS — Z0000 Encounter for general adult medical examination without abnormal findings: Secondary | ICD-10-CM | POA: Diagnosis not present

## 2017-08-07 DIAGNOSIS — E039 Hypothyroidism, unspecified: Secondary | ICD-10-CM | POA: Diagnosis not present

## 2017-08-07 DIAGNOSIS — E785 Hyperlipidemia, unspecified: Secondary | ICD-10-CM

## 2017-08-07 DIAGNOSIS — I10 Essential (primary) hypertension: Secondary | ICD-10-CM | POA: Diagnosis not present

## 2017-08-07 LAB — CBC WITH DIFFERENTIAL/PLATELET
BASOS PCT: 1.2 % (ref 0.0–3.0)
Basophils Absolute: 0.1 10*3/uL (ref 0.0–0.1)
EOS PCT: 2 % (ref 0.0–5.0)
Eosinophils Absolute: 0.1 10*3/uL (ref 0.0–0.7)
HEMATOCRIT: 40.1 % (ref 36.0–46.0)
HEMOGLOBIN: 13.5 g/dL (ref 12.0–15.0)
LYMPHS PCT: 28.4 % (ref 12.0–46.0)
Lymphs Abs: 2.1 10*3/uL (ref 0.7–4.0)
MCHC: 33.8 g/dL (ref 30.0–36.0)
MCV: 89.3 fl (ref 78.0–100.0)
Monocytes Absolute: 0.5 10*3/uL (ref 0.1–1.0)
Monocytes Relative: 6.4 % (ref 3.0–12.0)
Neutro Abs: 4.6 10*3/uL (ref 1.4–7.7)
Neutrophils Relative %: 62 % (ref 43.0–77.0)
Platelets: 402 10*3/uL — ABNORMAL HIGH (ref 150.0–400.0)
RBC: 4.49 Mil/uL (ref 3.87–5.11)
RDW: 13.9 % (ref 11.5–15.5)
WBC: 7.4 10*3/uL (ref 4.0–10.5)

## 2017-08-07 LAB — COMPREHENSIVE METABOLIC PANEL
ALBUMIN: 3.7 g/dL (ref 3.5–5.2)
ALK PHOS: 166 U/L — AB (ref 39–117)
ALT: 55 U/L — ABNORMAL HIGH (ref 0–35)
AST: 26 U/L (ref 0–37)
BUN: 14 mg/dL (ref 6–23)
CALCIUM: 9.5 mg/dL (ref 8.4–10.5)
CHLORIDE: 99 meq/L (ref 96–112)
CO2: 33 mEq/L — ABNORMAL HIGH (ref 19–32)
CREATININE: 0.6 mg/dL (ref 0.40–1.20)
GFR: 101.82 mL/min (ref >60.00)
Glucose, Bld: 114 mg/dL — ABNORMAL HIGH (ref 70–99)
Potassium: 4.2 mEq/L (ref 3.5–5.1)
SODIUM: 139 meq/L (ref 135–145)
TOTAL PROTEIN: 6.9 g/dL (ref 6.0–8.3)
Total Bilirubin: 0.4 mg/dL (ref 0.2–1.2)

## 2017-08-07 LAB — LIPID PANEL
CHOLESTEROL: 143 mg/dL (ref 0–200)
HDL: 39.2 mg/dL (ref >39.00)
LDL CALC: 78 mg/dL (ref 0–99)
NonHDL: 103.47
TRIGLYCERIDES: 128 mg/dL (ref 0.0–149.0)
Total CHOL/HDL Ratio: 4
VLDL: 25.6 mg/dL (ref 0.0–40.0)

## 2017-08-07 LAB — TSH: TSH: 0.33 u[IU]/mL — AB (ref 0.35–4.50)

## 2017-08-07 NOTE — Patient Instructions (Addendum)
Add boost / ensure 2x a day between meals     Preventive Care 65 Years and Older, Female Preventive care refers to lifestyle choices and visits with your health care provider that can promote health and wellness. What does preventive care include?  A yearly physical exam. This is also called an annual well check.  Dental exams once or twice a year.  Routine eye exams. Ask your health care provider how often you should have your eyes checked.  Personal lifestyle choices, including: ? Daily care of your teeth and gums. ? Regular physical activity. ? Eating a healthy diet. ? Avoiding tobacco and drug use. ? Limiting alcohol use. ? Practicing safe sex. ? Taking low-dose aspirin every day. ? Taking vitamin and mineral supplements as recommended by your health care provider. What happens during an annual well check? The services and screenings done by your health care provider during your annual well check will depend on your age, overall health, lifestyle risk factors, and family history of disease. Counseling Your health care provider may ask you questions about your:  Alcohol use.  Tobacco use.  Drug use.  Emotional well-being.  Home and relationship well-being.  Sexual activity.  Eating habits.  History of falls.  Memory and ability to understand (cognition).  Work and work Statistician.  Reproductive health.  Screening You may have the following tests or measurements:  Height, weight, and BMI.  Blood pressure.  Lipid and cholesterol levels. These may be checked every 5 years, or more frequently if you are over 23 years old.  Skin check.  Lung cancer screening. You may have this screening every year starting at age 6 if you have a 30-pack-year history of smoking and currently smoke or have quit within the past 15 years.  Fecal occult blood test (FOBT) of the stool. You may have this test every year starting at age 12.  Flexible sigmoidoscopy or  colonoscopy. You may have a sigmoidoscopy every 5 years or a colonoscopy every 10 years starting at age 15.  Hepatitis C blood test.  Hepatitis B blood test.  Sexually transmitted disease (STD) testing.  Diabetes screening. This is done by checking your blood sugar (glucose) after you have not eaten for a while (fasting). You may have this done every 1-3 years.  Bone density scan. This is done to screen for osteoporosis. You may have this done starting at age 36.  Mammogram. This may be done every 1-2 years. Talk to your health care provider about how often you should have regular mammograms.  Talk with your health care provider about your test results, treatment options, and if necessary, the need for more tests. Vaccines Your health care provider may recommend certain vaccines, such as:  Influenza vaccine. This is recommended every year.  Tetanus, diphtheria, and acellular pertussis (Tdap, Td) vaccine. You may need a Td booster every 10 years.  Varicella vaccine. You may need this if you have not been vaccinated.  Zoster vaccine. You may need this after age 17.  Measles, mumps, and rubella (MMR) vaccine. You may need at least one dose of MMR if you were born in 1957 or later. You may also need a second dose.  Pneumococcal 13-valent conjugate (PCV13) vaccine. One dose is recommended after age 43.  Pneumococcal polysaccharide (PPSV23) vaccine. One dose is recommended after age 81.  Meningococcal vaccine. You may need this if you have certain conditions.  Hepatitis A vaccine. You may need this if you have certain conditions or if  you travel or work in places where you may be exposed to hepatitis A.  Hepatitis B vaccine. You may need this if you have certain conditions or if you travel or work in places where you may be exposed to hepatitis B.  Haemophilus influenzae type b (Hib) vaccine. You may need this if you have certain conditions.  Talk to your health care provider about  which screenings and vaccines you need and how often you need them. This information is not intended to replace advice given to you by your health care provider. Make sure you discuss any questions you have with your health care provider. Document Released: 05/22/2015 Document Revised: 01/13/2016 Document Reviewed: 02/24/2015 Elsevier Interactive Patient Education  [employer].

## 2017-08-07 NOTE — Assessment & Plan Note (Signed)
Add boost/ ensure -- 2 a day between meals Pt states her appetite is improving Recheck 1 month

## 2017-08-07 NOTE — Assessment & Plan Note (Signed)
Tolerating statin, encouraged heart healthy diet, avoid trans fats, minimize simple carbs and saturated fats. Increase exercise as tolerated 

## 2017-08-07 NOTE — Progress Notes (Addendum)
Subjective:  I acted as a Education administrator for [employer]. Carrie Hall, [place]   Patient ID: Carrie Hall, female    DOB: 07-08-1933, 84 y.o.   MRN: [id number]  Chief Complaint  Patient presents with  . Annual Exam    HPI  Patient is in today for annual exam.  Pt has had a 13 lb weight loss in the last year.  Her daugher in law is with her and has many ? About her health.  Pt has stopped aricept and did not remember why-- she was referred to a neuro but never went.  Daughter in law would like it rescheduled and she will make sure she goes.    Patient Care Team: Carollee Herter, Alferd Apa, DO as PCP - General   Past Medical History:  Diagnosis Date  . Adenomatous colon polyp   . Breast cancer ([place])   . COPD (chronic obstructive pulmonary disease) ([place])   . Diverticulosis   . Hyperlipidemia   . Internal hemorrhoids   . Osteopenia   . Thyroid disease     Past Surgical History:  Procedure Laterality Date  . BREAST LUMPECTOMY Left    radiation only  . BREAST SURGERY    . colon polyps      Family History  Problem Relation Age of Onset  . Alzheimer's disease Mother   . Cancer Father        lung  . Alzheimer's disease Maternal Grandmother     Social History   Socioeconomic History  . Marital status: Widowed    Spouse name: Not on file  . Number of children: Not on file  . Years of education: Not on file  . Highest education level: Not on file  Occupational History  . Not on file  Social Needs  . Financial resource strain: Not on file  . Food insecurity:    Worry: Not on file    Inability: Not on file  . Transportation needs:    Medical: Not on file    Non-medical: Not on file  Tobacco Use  . Smoking status: Former Smoker    Packs/day: 0.50    Types: Cigarettes    Last attempt to quit: 12/06/2003    Years since quitting: 13.6  . Smokeless tobacco: Never Used  Substance and Sexual Activity  . Alcohol use: No  . Drug use: No  . Sexual activity: Never    Partners: Male    Lifestyle  . Physical activity:    Days per week: Not on file    Minutes per session: Not on file  . Stress: Not on file  Relationships  . Social connections:    Talks on phone: Not on file    Gets together: Not on file    Attends religious service: Not on file    Active member of club or organization: Not on file    Attends meetings of clubs or organizations: Not on file    Relationship status: Not on file  . Intimate partner violence:    Fear of current or ex partner: Not on file    Emotionally abused: Not on file    Physically abused: Not on file    Forced sexual activity: Not on file  Other Topics Concern  . Not on file  Social History Narrative  . Not on file    Outpatient Medications Prior to Visit  Medication Sig Dispense Refill  . ALPRAZolam (XANAX) 0.25 MG tablet Take 1 tablet (0.25 mg total) by mouth  at bedtime as needed for anxiety. 30 tablet 0  . atorvastatin (LIPITOR) 20 MG tablet TAKE 1 TABLET BY MOUTH ONCE DAILY 90 tablet 1  . benzonatate (TESSALON) 100 MG capsule Take 1 capsule (100 mg total) by mouth every 8 (eight) hours. 21 capsule 0  . cephALEXin (KEFLEX) 500 MG capsule Take 1 capsule (500 mg total) by mouth 2 (two) times daily. 14 capsule 0  . cyanocobalamin 500 MCG tablet Take 500 mcg by mouth daily.    . fenofibrate 160 MG tablet TAKE ONE TABLET BY MOUTH ONCE DAILY 90 tablet 1  . folic acid (FOLVITE) 1 MG tablet Take 1 mg by mouth daily.    Marland Kitchen levothyroxine (SYNTHROID, LEVOTHROID) 112 MCG tablet TAKE ONE TABLET BY MOUTH ONCE DAILY BEFORE BREAKFAST 90 tablet 1  . levothyroxine (SYNTHROID, LEVOTHROID) 112 MCG tablet TAKE ONE TABLET BY MOUTH ONCE DAILY BEFORE BREAKFAST 90 tablet 3  . methotrexate (50 MG/ML) 1 g injection Inject into the vein once a week.    Marland Kitchen albuterol (PROVENTIL HFA;VENTOLIN HFA) 108 (90 Base) MCG/ACT inhaler Inhale 1-2 puffs into the lungs every 6 (six) hours as needed for wheezing. (Patient not taking: Reported on 08/07/2017) 1 Inhaler 0  .  donepezil (ARICEPT) 5 MG tablet Take 1 tablet (5 mg total) by mouth at bedtime. (Patient not taking: Reported on 08/07/2017) 30 tablet 2  . fluticasone (FLOVENT HFA) 110 MCG/ACT inhaler Inhale 2 puffs into the lungs 2 (two) times daily. (Patient not taking: Reported on 08/07/2017) 1 Inhaler 2   No facility-administered medications prior to visit.     No Known Allergies  Review of Systems  Constitutional: Negative for chills, fever and malaise/fatigue.  HENT: Negative for congestion and hearing loss.   Eyes: Negative for discharge.  Respiratory: Negative for cough, sputum production and shortness of breath.   Cardiovascular: Negative for chest pain, palpitations and leg swelling.  Gastrointestinal: Negative for abdominal pain, blood in stool, constipation, diarrhea, heartburn, nausea and vomiting.  Genitourinary: Negative for dysuria, frequency, hematuria and urgency.  Musculoskeletal: Negative for back pain, falls and myalgias.  Skin: Negative for rash.  Neurological: Negative for dizziness, sensory change, loss of consciousness, weakness and headaches.  Endo/Heme/Allergies: Negative for environmental allergies. Does not bruise/bleed easily.  Psychiatric/Behavioral: Positive for memory loss. Negative for depression and suicidal ideas. The patient is not nervous/anxious and does not have insomnia.        Objective:    Physical Exam  Constitutional: She is oriented to person, place, and time. She appears well-developed and well-nourished. No distress.  HENT:  Head: Normocephalic and atraumatic.  Right Ear: External ear normal.  Left Ear: External ear normal.  Nose: Nose normal.  Mouth/Throat: Oropharynx is clear and moist.  Eyes: Pupils are equal, round, and reactive to light. Conjunctivae and EOM are normal. Right eye exhibits no discharge. Left eye exhibits no discharge.  Neck: Normal range of motion. Neck supple. No JVD present. No thyromegaly present.  Cardiovascular: Normal rate,  regular rhythm, normal heart sounds and intact distal pulses.  No murmur heard. Pulmonary/Chest: Effort normal and breath sounds normal. No respiratory distress. She has no wheezes. She has no rales. She exhibits no tenderness. Right breast exhibits inverted nipple. Right breast exhibits no mass, no nipple discharge, no skin change and no tenderness. Left breast exhibits no inverted nipple, no mass, no nipple discharge, no skin change and no tenderness. No breast swelling, tenderness, discharge or bleeding.  Abdominal: Soft. Bowel sounds are normal. She exhibits no distension  and no mass. There is no tenderness. There is no rebound and no guarding.  Musculoskeletal: Normal range of motion. She exhibits no edema or tenderness.  Lymphadenopathy:    She has no cervical adenopathy.  Neurological: She is alert and oriented to person, place, and time. She has normal reflexes. No cranial nerve deficit.  Skin: Skin is warm and dry. No rash noted. She is not diaphoretic. No erythema.  Psychiatric: She has a normal mood and affect. Her behavior is normal. Judgment and thought content normal.  Nursing note and vitals reviewed.   BP 134/70 (BP Location: Left Arm, Patient Position: Sitting, Cuff Size: Normal)   Pulse 72   Temp 97.6 F (36.4 C) (Oral)   Resp 16   Wt 139 lb 9.6 oz (63.3 kg)   SpO2 97%   BMI 23.96 kg/m  Wt Readings from Last 3 Encounters:  08/07/17 139 lb 9.6 oz (63.3 kg)  07/22/16 153 lb 6 oz (69.6 kg)  07/15/16 155 lb (70.3 kg)   BP Readings from Last 3 Encounters:  08/07/17 134/70  07/22/16 110/68  07/15/16 141/74     Immunization History  Administered Date(s) Administered  . Influenza Split 03/15/2011  . Influenza Whole 03/21/2007, 02/04/2008, 02/25/2009, 03/11/2010, 02/07/2012  . Influenza, High Dose Seasonal PF 03/07/2014, 01/28/2016, 02/22/2017  . Influenza,inj,Quad PF,6+ Mos 02/14/2013, 02/26/2015  . Pneumococcal Conjugate-13 03/07/2014  . Pneumococcal  Polysaccharide-23 05/15/2006, 12/11/2007  . Td 02/24/2004  . Zoster 11/02/2009    Health Maintenance  Topic Date Due  . DEXA SCAN  12/25/2000  . TETANUS/TDAP  08/08/2019 (Originally 02/23/2014)  . INFLUENZA VACCINE  12/07/2017  . MAMMOGRAM  03/13/2018  . PNA vac Low Risk Adult  Completed    Lab Results  Component Value Date   WBC 6.9 08/25/2015   HGB 13.7 08/25/2015   HCT 41.0 08/25/2015   PLT 303.0 08/25/2015   GLUCOSE 98 01/28/2016   CHOL 166 01/28/2016   TRIG 77.0 01/28/2016   HDL 63.20 01/28/2016   LDLDIRECT 103.3 03/07/2014   LDLCALC 88 01/28/2016   ALT 12 01/28/2016   AST 13 01/28/2016   NA 141 01/28/2016   K 4.2 01/28/2016   CL 101 01/28/2016   CREATININE 0.77 01/28/2016   BUN 19 01/28/2016   CO2 35 (H) 01/28/2016   TSH 1.27 08/25/2015   HGBA1C 6.1 07/20/2012    Lab Results  Component Value Date   TSH 1.27 08/25/2015   Lab Results  Component Value Date   WBC 6.9 08/25/2015   HGB 13.7 08/25/2015   HCT 41.0 08/25/2015   MCV 89.6 08/25/2015   PLT 303.0 08/25/2015   Lab Results  Component Value Date   NA 141 01/28/2016   K 4.2 01/28/2016   CO2 35 (H) 01/28/2016   GLUCOSE 98 01/28/2016   BUN 19 01/28/2016   CREATININE 0.77 01/28/2016   BILITOT 0.4 01/28/2016   ALKPHOS 52 01/28/2016   AST 13 01/28/2016   ALT 12 01/28/2016   PROT 7.0 01/28/2016   ALBUMIN 4.1 01/28/2016   CALCIUM 9.6 01/28/2016   GFR 76.64 01/28/2016   Lab Results  Component Value Date   CHOL 166 01/28/2016   Lab Results  Component Value Date   HDL 63.20 01/28/2016   Lab Results  Component Value Date   LDLCALC 88 01/28/2016   Lab Results  Component Value Date   TRIG 77.0 01/28/2016   Lab Results  Component Value Date   CHOLHDL 3 01/28/2016   Lab Results  Component  Value Date   HGBA1C 6.1 07/20/2012         Assessment & Plan:   Problem List Items Addressed This Visit      Unprioritized   Hyperlipidemia LDL goal <100    Tolerating statin, encouraged heart  healthy diet, avoid trans fats, minimize simple carbs and saturated fats. Increase exercise as tolerated      Hypothyroidism    Check labs      Relevant Orders   CBC with Differential/Platelet   Comprehensive metabolic panel   Lipid panel   TSH   Memory loss    Refer to neuro for further eval      Relevant Orders   Ambulatory referral to Neurology   Preventative health care - Primary    ghm utd Check labs See AVS Tetanus at pharmacy D/w pt shingrix at pharmacy as well       Relevant Orders   CBC with Differential/Platelet   Comprehensive metabolic panel   Lipid panel   TSH   Weight loss, non-intentional    Add boost/ ensure -- 2 a day between meals Pt states her appetite is improving Recheck 1 month        Other Visit Diagnoses    Essential hypertension       Relevant Orders   CBC with Differential/Platelet   Comprehensive metabolic panel   Lipid panel   Hyperlipidemia LDL goal <70       Relevant Orders   Comprehensive metabolic panel   Lipid panel   Inverted nipple       Relevant Orders   US BREAST LTD UNI RIGHT INC AXILLA   MM DIAG BREAST TOMO UNI RIGHT   Estrogen deficiency       Relevant Orders   DG Bone Density      I am having Mimie H. Franckowiak maintain her cyanocobalamin, folic acid, levothyroxine, donepezil, albuterol, benzonatate, methotrexate, cephALEXin, fluticasone, fenofibrate, ALPRAZolam, levothyroxine, and atorvastatin.  No orders of the defined types were placed in this encounter.   CMA served as Education administrator during this visit. History, Physical and Plan performed by medical provider. Documentation and orders reviewed and attested to.  [name], DO

## 2017-08-07 NOTE — Assessment & Plan Note (Signed)
Refer to neuro for further eval

## 2017-08-07 NOTE — Assessment & Plan Note (Signed)
ghm utd Check labs See AVS Tetanus at pharmacy D/w pt shingrix at pharmacy as well

## 2017-08-07 NOTE — Assessment & Plan Note (Signed)
Check labs 

## 2017-08-09 ENCOUNTER — Telehealth: Payer: Self-pay | Admitting: *Deleted

## 2017-08-09 ENCOUNTER — Telehealth: Payer: Self-pay | Admitting: Family Medicine

## 2017-08-09 DIAGNOSIS — E039 Hypothyroidism, unspecified: Secondary | ICD-10-CM

## 2017-08-09 DIAGNOSIS — R748 Abnormal levels of other serum enzymes: Secondary | ICD-10-CM

## 2017-08-09 NOTE — Telephone Encounter (Signed)
Copied from [place]. Topic: Quick Communication - See Telephone Encounter >> Aug 09, 2017  2:27 PM Ether Griffins B wrote: CRM for notification. See Telephone encounter for: 08/09/17. Pt contacted pharmacy to get medication refilled and they stated a change had been made in her thyroid and cholesterol medication. Pt would like a call to verify the medication that has been changed and how she should take it. Also will be needing refills.

## 2017-08-09 NOTE — Telephone Encounter (Signed)
Copied from [place] [phone]. Topic: General - Call Back - No Documentation >> Aug 07, 2017 11:19 AM Conception Chancy, NT wrote: Patient is calling and states she missed a call from the office. Please advise.

## 2017-08-10 ENCOUNTER — Encounter: Payer: Self-pay | Admitting: Neurology

## 2017-08-10 MED ORDER — LEVOTHYROXINE SODIUM 100 MCG PO TABS: 100.0000 ug | ORAL_TABLET | Freq: Every day | ORAL | 1 refills | Status: DC

## 2017-08-10 MED ORDER — ATORVASTATIN CALCIUM 10 MG PO TABS: 10.0000 mg | ORAL_TABLET | Freq: Every day | ORAL | 1 refills | Status: DC

## 2017-08-10 NOTE — Telephone Encounter (Signed)
Spoke with patients daughter.  See other phone message

## 2017-08-10 NOTE — Telephone Encounter (Signed)
Daughter notified of labs and rxs sent to pharmacy

## 2017-08-11 ENCOUNTER — Ambulatory Visit
Admission: RE | Admit: 2017-08-11 | Discharge: 2017-08-11 | Disposition: A | Discharge status: Home / Self Care | Payer: Medicare HMO | Source: Ambulatory Visit | Attending: Family Medicine | Admitting: Family Medicine

## 2017-08-11 ENCOUNTER — Ambulatory Visit: Payer: Medicare HMO

## 2017-08-11 DIAGNOSIS — R922 Inconclusive mammogram: Secondary | ICD-10-CM | POA: Diagnosis not present

## 2017-08-11 DIAGNOSIS — N6459 Other signs and symptoms in breast: Secondary | ICD-10-CM

## 2017-08-14 DIAGNOSIS — L408 Other psoriasis: Secondary | ICD-10-CM | POA: Diagnosis not present

## 2017-09-11 ENCOUNTER — Ambulatory Visit (INDEPENDENT_AMBULATORY_CARE_PROVIDER_SITE_OTHER): Payer: Medicare HMO | Admitting: Family Medicine

## 2017-09-11 ENCOUNTER — Encounter: Payer: Self-pay | Admitting: Family Medicine

## 2017-09-11 VITALS — BP 120/67 | HR 63 | Temp 98.8°F | Resp 16 | Ht 64.0 in | Wt 142.4 lb

## 2017-09-11 DIAGNOSIS — E038 Other specified hypothyroidism: Secondary | ICD-10-CM | POA: Diagnosis not present

## 2017-09-11 DIAGNOSIS — R413 Other amnesia: Secondary | ICD-10-CM | POA: Diagnosis not present

## 2017-09-11 DIAGNOSIS — R634 Abnormal weight loss: Secondary | ICD-10-CM | POA: Diagnosis not present

## 2017-09-11 DIAGNOSIS — R748 Abnormal levels of other serum enzymes: Secondary | ICD-10-CM | POA: Diagnosis not present

## 2017-09-11 NOTE — Patient Instructions (Signed)
Levothyroxine tablets What is this medicine? LEVOTHYROXINE (lee voe thye ROX een) is a thyroid hormone. This medicine can improve symptoms of thyroid deficiency such as slow speech, lack of energy, weight gain, hair loss, dry skin, and feeling cold. It also helps to treat goiter (an enlarged thyroid gland). It is also used to treat some kinds of thyroid cancer along with surgery and other medicines. This medicine may be used for other purposes; ask your health care provider or pharmacist if you have questions. COMMON BRAND NAME(S): Estre, Levo-T, Levothroid, Levoxyl, Synthroid, Thyro-Tabs, Unithroid What should I tell my health care provider before I take this medicine? They need to know if you have any of these conditions: -angina -blood clotting problems -diabetes -dieting or on a weight loss program -fertility problems -heart disease -high levels of thyroid hormone -pituitary gland problem -previous heart attack -an unusual or allergic reaction to levothyroxine, thyroid hormones, other medicines, foods, dyes, or preservatives -pregnant or trying to get pregnant -breast-feeding How should I use this medicine? Take this medicine by mouth with plenty of water. It is best to take on an empty stomach, at least 30 minutes before or 2 hours after food. Follow the directions on the prescription label. Take at the same time each day. Do not take your medicine more often than directed. Contact your pediatrician regarding the use of this medicine in children. While this drug may be prescribed for children and infants as young as a few days of age for selected conditions, precautions do apply. For infants, you may crush the tablet and place in a small amount of (5-10 ml or 1 to 2 teaspoonfuls) of water, breast milk, or non-soy based infant formula. Do not mix with soy-based infant formula. Give as directed. Overdosage: If you think you have taken too much of this medicine contact a poison control center  or emergency room at once. NOTE: This medicine is only for you. Do not share this medicine with others. What if I miss a dose? If you miss a dose, take it as soon as you can. If it is almost time for your next dose, take only that dose. Do not take double or extra doses. What may interact with this medicine? -amiodarone -antacids -anti-thyroid medicines -calcium supplements -carbamazepine -cholestyramine -colestipol -digoxin -female hormones, including contraceptive or birth control pills -iron supplements -ketamine -liquid nutrition products like Ensure -medicines for colds and breathing difficulties -medicines for diabetes -medicines for mental depression -medicines or herbals used to decrease weight or appetite -phenobarbital or other barbiturate medications -phenytoin -prednisone or other corticosteroids -rifabutin -rifampin -soy isoflavones -sucralfate -theophylline -warfarin This list may not describe all possible interactions. Give your health care provider a list of all the medicines, herbs, non-prescription drugs, or dietary supplements you use. Also tell them if you smoke, drink alcohol, or use illegal drugs. Some items may interact with your medicine. What should I watch for while using this medicine? Be sure to take this medicine with plenty of fluids. Some tablets may cause choking, gagging, or difficulty swallowing from the tablet getting stuck in your throat. Most of these problems disappear if the medicine is taken with the right amount of water or other fluids. Do not switch brands of this medicine unless your health care professional agrees with the change. Ask questions if you are uncertain. You will need regular exams and occasional blood tests to check the response to treatment. If you are receiving this medicine for an underactive thyroid, it may be several weeks   before you notice an improvement. Check with your doctor or health care professional if your  symptoms do not improve. It may be necessary for you to take this medicine for the rest of your life. Do not stop using this medicine unless your doctor or health care professional advises you to. This medicine can affect blood sugar levels. If you have diabetes, check your blood sugar as directed. You may lose some of your hair when you first start treatment. With time, this usually corrects itself. If you are going to have surgery, tell your doctor or health care professional that you are taking this medicine. What side effects may I notice from receiving this medicine? Side effects that you should report to your doctor or health care professional as soon as possible: -allergic reactions like skin rash, itching or hives, swelling of the face, lips, or tongue -chest pain -excessive sweating or intolerance to heat -fast or irregular heartbeat -nervousness -skin rash or hives -swelling of ankles, feet, or legs -tremors Side effects that usually do not require medical attention (report to your doctor or health care professional if they continue or are bothersome): -changes in appetite -changes in menstrual periods -diarrhea -hair loss -headache -trouble sleeping -weight loss This list may not describe all possible side effects. Call your doctor for medical advice about side effects. You may report side effects to FDA at 1-800-FDA-1088. Where should I keep my medicine? Keep out of the reach of children. Store at room temperature between 15 and 30 degrees C (59 and 86 degrees F). Protect from light and moisture. Keep container tightly closed. Throw away any unused medicine after the expiration date. NOTE: This sheet is a summary. It may not cover all possible information. If you have questions about this medicine, talk to your doctor, pharmacist, or health care provider.  2018 Elsevier/Gold Standard (2008-08-01 14:28:07)  

## 2017-09-11 NOTE — Assessment & Plan Note (Signed)
Doing better with adding ensure Thyroid med also adjusted Recheck labs in June

## 2017-09-11 NOTE — Assessment & Plan Note (Signed)
Neuro appointment pending

## 2017-09-11 NOTE — Assessment & Plan Note (Signed)
synthroid adjusted  Pt aware and daughter importance of taking it every day Recheck June

## 2017-09-11 NOTE — Progress Notes (Signed)
Patient ID: Carrie Hall, female   DOB: 03-07-1934, 84 y.o.   MRN: [id number]     Subjective:  I acted as a Education administrator for Dr. Carollee Herter.  Guerry Bruin, [place]   Patient ID: Carrie Hall, female    DOB: 1933-09-15, 84 y.o.   MRN: [id number]  Chief Complaint  Patient presents with  . Weight Check    HPI  Patient is in today for weight check.  She has been drinking Ensure and eating well.  He daughter is with her-- she saw the rheumatologist -- - she felt it was not the methotrexate causing the elevated alk phos.   The rheum would like her seen by GI.   Pt daughter is with her -- she is doing better with eating and is drinking ensure.  She did admit to not taking her thyroid meds regularly but has been better lately.   No other complaints.    Patient Care Team: Carollee Herter, Alferd Apa, DO as PCP - General   Past Medical History:  Diagnosis Date  . Adenomatous colon polyp   . Breast cancer ([place])   . COPD (chronic obstructive pulmonary disease) ([place])   . Diverticulosis   . Hyperlipidemia   . Internal hemorrhoids   . Osteopenia   . Thyroid disease     Past Surgical History:  Procedure Laterality Date  . BREAST LUMPECTOMY Left    radiation only  . BREAST SURGERY    . colon polyps      Family History  Problem Relation Age of Onset  . Alzheimer's disease Mother   . Cancer Father        lung  . Alzheimer's disease Maternal Grandmother     Social History   Socioeconomic History  . Marital status: Widowed    Spouse name: Not on file  . Number of children: Not on file  . Years of education: Not on file  . Highest education level: Not on file  Occupational History  . Not on file  Social Needs  . Financial resource strain: Not on file  . Food insecurity:    Worry: Not on file    Inability: Not on file  . Transportation needs:    Medical: Not on file    Non-medical: Not on file  Tobacco Use  . Smoking status: Former Smoker    Packs/day: 0.50    Types: Cigarettes    Last  attempt to quit: 12/06/2003    Years since quitting: 13.7  . Smokeless tobacco: Never Used  Substance and Sexual Activity  . Alcohol use: No  . Drug use: No  . Sexual activity: Never    Partners: Male  Lifestyle  . Physical activity:    Days per week: Not on file    Minutes per session: Not on file  . Stress: Not on file  Relationships  . Social connections:    Talks on phone: Not on file    Gets together: Not on file    Attends religious service: Not on file    Active member of club or organization: Not on file    Attends meetings of clubs or organizations: Not on file    Relationship status: Not on file  . Intimate partner violence:    Fear of current or ex partner: Not on file    Emotionally abused: Not on file    Physically abused: Not on file    Forced sexual activity: Not on file  Other Topics Concern  .  Not on file  Social History Narrative  . Not on file    Outpatient Medications Prior to Visit  Medication Sig Dispense Refill  . ALPRAZolam (XANAX) 0.25 MG tablet Take 1 tablet (0.25 mg total) by mouth at bedtime as needed for anxiety. 30 tablet 0  . atorvastatin (LIPITOR) 10 MG tablet Take 1 tablet (10 mg total) by mouth daily. 90 tablet 1  . cyanocobalamin 500 MCG tablet Take 500 mcg by mouth daily.    Marland Kitchen donepezil (ARICEPT) 5 MG tablet Take 1 tablet (5 mg total) by mouth at bedtime. 30 tablet 2  . fenofibrate 160 MG tablet TAKE ONE TABLET BY MOUTH ONCE DAILY 90 tablet 1  . fluticasone (FLOVENT HFA) 110 MCG/ACT inhaler Inhale 2 puffs into the lungs 2 (two) times daily. 1 Inhaler 2  . folic acid (FOLVITE) 1 MG tablet Take 1 mg by mouth daily.    Marland Kitchen levothyroxine (SYNTHROID, LEVOTHROID) 100 MCG tablet Take 1 tablet (100 mcg total) by mouth daily. 90 tablet 1  . methotrexate (50 MG/ML) 1 g injection Inject into the vein once a week.    Marland Kitchen albuterol (PROVENTIL HFA;VENTOLIN HFA) 108 (90 Base) MCG/ACT inhaler Inhale 1-2 puffs into the lungs every 6 (six) hours as needed for  wheezing. 1 Inhaler 0  . benzonatate (TESSALON) 100 MG capsule Take 1 capsule (100 mg total) by mouth every 8 (eight) hours. 21 capsule 0  . cephALEXin (KEFLEX) 500 MG capsule Take 1 capsule (500 mg total) by mouth 2 (two) times daily. 14 capsule 0   No facility-administered medications prior to visit.     No Known Allergies  Review of Systems  Constitutional: Negative for fever and malaise/fatigue.  HENT: Negative for congestion.   Eyes: Negative for blurred vision.  Respiratory: Negative for cough and shortness of breath.   Cardiovascular: Negative for chest pain, palpitations and leg swelling.  Gastrointestinal: Negative for vomiting.  Musculoskeletal: Negative for back pain.  Skin: Negative for rash.  Neurological: Negative for loss of consciousness and headaches.       Objective:    Physical Exam  Constitutional: She is oriented to person, place, and time. She appears well-developed and well-nourished.  HENT:  Head: Normocephalic and atraumatic.  Eyes: Conjunctivae and EOM are normal.  Neck: Normal range of motion. Neck supple. No JVD present. Carotid bruit is not present. No thyromegaly present.  Cardiovascular: Normal rate, regular rhythm and normal heart sounds.  No murmur heard. Pulmonary/Chest: Effort normal and breath sounds normal. No respiratory distress. She has no wheezes. She has no rales. She exhibits no tenderness.  Musculoskeletal: She exhibits no edema.  Neurological: She is alert and oriented to person, place, and time.  Psychiatric: She has a normal mood and affect.  Nursing note and vitals reviewed.   BP 136/80 (Cuff Size: Normal)   Pulse 63   Temp 98.8 F (37.1 C) (Oral)   Resp 16   Ht 5' 4" (1.626 m)   Wt 142 lb 6.4 oz (64.6 kg)   SpO2 95%   BMI 24.44 kg/m  Wt Readings from Last 3 Encounters:  09/11/17 142 lb 6.4 oz (64.6 kg)  08/07/17 139 lb 9.6 oz (63.3 kg)  07/22/16 153 lb 6 oz (69.6 kg)   BP Readings from Last 3 Encounters:  09/11/17  136/80  08/07/17 134/70  07/22/16 110/68     Immunization History  Administered Date(s) Administered  . Influenza Split 03/15/2011  . Influenza Whole 03/21/2007, 02/04/2008, 02/25/2009, 03/11/2010, 02/07/2012  .  Influenza, High Dose Seasonal PF 03/07/2014, 01/28/2016, 02/22/2017  . Influenza,inj,Quad PF,6+ Mos 02/14/2013, 02/26/2015  . Pneumococcal Conjugate-13 03/07/2014  . Pneumococcal Polysaccharide-23 05/15/2006, 12/11/2007  . Td 02/24/2004  . Zoster 11/02/2009    Health Maintenance  Topic Date Due  . DEXA SCAN  12/25/2000  . TETANUS/TDAP  08/08/2019 (Originally 02/23/2014)  . INFLUENZA VACCINE  12/07/2017  . MAMMOGRAM  03/13/2018  . PNA vac Low Risk Adult  Completed    Lab Results  Component Value Date   WBC 7.4 08/07/2017   HGB 13.5 08/07/2017   HCT 40.1 08/07/2017   PLT 402.0 (H) 08/07/2017   GLUCOSE 114 (H) 08/07/2017   CHOL 143 08/07/2017   TRIG 128.0 08/07/2017   HDL 39.20 08/07/2017   LDLDIRECT 103.3 03/07/2014   LDLCALC 78 08/07/2017   ALT 55 (H) 08/07/2017   AST 26 08/07/2017   NA 139 08/07/2017   K 4.2 08/07/2017   CL 99 08/07/2017   CREATININE 0.60 08/07/2017   BUN 14 08/07/2017   CO2 33 (H) 08/07/2017   TSH 0.33 (L) 08/07/2017   HGBA1C 6.1 07/20/2012    Lab Results  Component Value Date   TSH 0.33 (L) 08/07/2017   Lab Results  Component Value Date   WBC 7.4 08/07/2017   HGB 13.5 08/07/2017   HCT 40.1 08/07/2017   MCV 89.3 08/07/2017   PLT 402.0 (H) 08/07/2017   Lab Results  Component Value Date   NA 139 08/07/2017   K 4.2 08/07/2017   CO2 33 (H) 08/07/2017   GLUCOSE 114 (H) 08/07/2017   BUN 14 08/07/2017   CREATININE 0.60 08/07/2017   BILITOT 0.4 08/07/2017   ALKPHOS 166 (H) 08/07/2017   AST 26 08/07/2017   ALT 55 (H) 08/07/2017   PROT 6.9 08/07/2017   ALBUMIN 3.7 08/07/2017   CALCIUM 9.5 08/07/2017   GFR 101.82 08/07/2017   Lab Results  Component Value Date   CHOL 143 08/07/2017   Lab Results  Component Value Date    HDL 39.20 08/07/2017   Lab Results  Component Value Date   LDLCALC 78 08/07/2017   Lab Results  Component Value Date   TRIG 128.0 08/07/2017   Lab Results  Component Value Date   CHOLHDL 4 08/07/2017   Lab Results  Component Value Date   HGBA1C 6.1 07/20/2012         Assessment & Plan:   Problem List Items Addressed This Visit      Unprioritized   Hypothyroidism    synthroid adjusted  Pt aware and daughter importance of taking it every day Recheck June      Memory loss    Neuro appointment pending      Weight loss, non-intentional    Doing better with adding ensure Thyroid med also adjusted Recheck labs in June        Other Visit Diagnoses    Elevated alkaline phosphatase level    -  Primary   Relevant Orders   Ambulatory referral to Gastroenterology      I have discontinued Danie H. Sonier's albuterol, benzonatate, and cephALEXin. I am also having her maintain her vitamin O-70, folic acid, donepezil, methotrexate, fluticasone, fenofibrate, ALPRAZolam, levothyroxine, and atorvastatin.  No orders of the defined types were placed in this encounter.   CMA served as Education administrator during this visit. History, Physical and Plan performed by medical provider. Documentation and orders reviewed and attested to.  [name], DO

## 2017-09-13 ENCOUNTER — Other Ambulatory Visit: Payer: Self-pay | Admitting: Family Medicine

## 2017-09-13 DIAGNOSIS — F411 Generalized anxiety disorder: Secondary | ICD-10-CM

## 2017-09-13 NOTE — Telephone Encounter (Signed)
Requesting:Alprazolam Contract:06/09/2014 needs updated [facility]SC OFB:[id number] UDS Last Visit:09/11/17 Next Visit:12/15/17 Last Refill:01/17/17  Please Advise  Data base ran and printed on your desk for review.

## 2017-09-13 NOTE — Telephone Encounter (Signed)
Requesting: xanax 0.25mg  HS prn Contract: None on file UDS: 2016 Last OV: 09/11/17 Next OV: 12/15/17 with Dr. Etter Sjogren. Has appt. With Health Coach on 6/21.  Last refill: 01/17/2017 Database: unknown  Please advise.

## 2017-09-13 NOTE — Telephone Encounter (Signed)
Copied from [place] [id number]. Topic: Quick Communication - See Telephone Encounter >> Sep 13, 2017  8:50 AM Ahmed Prima L wrote: CRM for notification. See Telephone encounter for: 09/13/17.  ALPRAZolam Duanne Moron) 0.25 MG tablet  [facility] [address] [place], [place] - [address]

## 2017-09-13 NOTE — Telephone Encounter (Signed)
Refill of Xanax  LOV 09/11/17  Dr. Carollee Herter  LRF 01/17/17    #30  0 refills  [id number] - [place], [place] - [address]

## 2017-09-14 ENCOUNTER — Other Ambulatory Visit: Payer: Self-pay | Admitting: Family Medicine

## 2017-09-14 DIAGNOSIS — F411 Generalized anxiety disorder: Secondary | ICD-10-CM

## 2017-09-14 MED ORDER — ALPRAZOLAM 0.25 MG PO TABS: 0.2500 mg | ORAL_TABLET | Freq: Every evening | ORAL | 0 refills | Status: DC | PRN

## 2017-09-18 ENCOUNTER — Ambulatory Visit: Payer: Medicare HMO | Admitting: *Deleted

## 2017-09-20 ENCOUNTER — Other Ambulatory Visit: Payer: Medicare HMO

## 2017-10-04 ENCOUNTER — Other Ambulatory Visit (INDEPENDENT_AMBULATORY_CARE_PROVIDER_SITE_OTHER): Payer: Medicare HMO

## 2017-10-04 ENCOUNTER — Encounter: Payer: Self-pay | Admitting: Gastroenterology

## 2017-10-04 ENCOUNTER — Ambulatory Visit: Payer: Medicare HMO | Admitting: Gastroenterology

## 2017-10-04 VITALS — BP 108/60 | HR 60 | Ht 64.0 in | Wt 142.0 lb

## 2017-10-04 DIAGNOSIS — R945 Abnormal results of liver function studies: Secondary | ICD-10-CM | POA: Diagnosis not present

## 2017-10-04 DIAGNOSIS — R7989 Other specified abnormal findings of blood chemistry: Secondary | ICD-10-CM

## 2017-10-04 DIAGNOSIS — R634 Abnormal weight loss: Secondary | ICD-10-CM | POA: Diagnosis not present

## 2017-10-04 DIAGNOSIS — R195 Other fecal abnormalities: Secondary | ICD-10-CM

## 2017-10-04 LAB — BASIC METABOLIC PANEL
BUN: 14 mg/dL (ref 6–23)
CALCIUM: 9.8 mg/dL (ref 8.4–10.5)
CO2: 35 mEq/L — ABNORMAL HIGH (ref 19–32)
Chloride: 100 mEq/L (ref 96–112)
Creatinine, Ser: 0.67 mg/dL (ref 0.40–1.20)
GFR: 89.61 mL/min (ref >60.00)
GLUCOSE: 96 mg/dL (ref 70–99)
Potassium: 4.2 mEq/L (ref 3.5–5.1)
Sodium: 141 mEq/L (ref 135–145)

## 2017-10-04 LAB — HEPATIC FUNCTION PANEL
ALT: 15 U/L (ref 0–35)
AST: 16 U/L (ref 0–37)
Albumin: 4.2 g/dL (ref 3.5–5.2)
Alkaline Phosphatase: 83 U/L (ref 39–117)
BILIRUBIN DIRECT: 0.1 mg/dL (ref 0.0–0.3)
TOTAL PROTEIN: 7.3 g/dL (ref 6.0–8.3)
Total Bilirubin: 0.4 mg/dL (ref 0.2–1.2)

## 2017-10-04 LAB — GAMMA GT: GGT: 14 U/L (ref 7–51)

## 2017-10-04 NOTE — Progress Notes (Addendum)
10/04/2017 AASIYAH SCHWITZER [id number] December 24, 1933   HISTORY OF PRESENT ILLNESS:  This is a 84 year old female who was seen here a few years ago for diarrhea/loose stools thought to be IBS.  She is referred back here on this occasion for elevated ALP.  ALP was 166.  ALT was 55.  AST and total bili were normal.  LFT's never elevated in the past.  Has been on methotrexate for past couple of years from dermatologist.  Reports weight loss of about 15 pounds over the past several months, unintentionally, but says that appetite was decreased so she was not eating as much.  Thinks that she has begun to gain a few pounds back again.  She denies abdominal pain.  Still reports loose stool.  This has continued from almost 3 years ago when she was here and is about the same.  Has 1-2 BM's per day but they are usually loose.  No nocturnal stools.  No blood in stools.   Past Medical History:  Diagnosis Date  . Adenomatous colon polyp   . Breast cancer ([facility])   . COPD (chronic obstructive pulmonary disease) ([facility])   . Diverticulosis   . Hyperlipidemia   . Internal hemorrhoids   . Osteopenia   . Thyroid disease    Past Surgical History:  Procedure Laterality Date  . BREAST LUMPECTOMY Left    radiation only  . BREAST SURGERY    . colon polyps      reports that she quit smoking about 13 years ago. Her smoking use included cigarettes. She smoked 0.50 packs per day. She has never used smokeless tobacco. She reports that she does not drink alcohol or use drugs. family history includes Alzheimer's disease in her maternal grandmother and mother; Cancer in her father; Rectal cancer in her sister. No Known Allergies    Outpatient Encounter Medications as of 10/04/2017  Medication Sig  . ALPRAZolam (XANAX) 0.25 MG tablet Take 1 tablet (0.25 mg total) by mouth at bedtime as needed for anxiety.  Marland Kitchen atorvastatin (LIPITOR) 10 MG tablet Take 1 tablet (10 mg total) by mouth daily.  . folic acid (FOLVITE) 1 MG  tablet Take 1 mg by mouth daily.  Marland Kitchen levothyroxine (SYNTHROID, LEVOTHROID) 100 MCG tablet Take 1 tablet (100 mcg total) by mouth daily.  . methotrexate (50 MG/ML) 1 g injection Inject into the vein once a week.  . [DISCONTINUED] cyanocobalamin 500 MCG tablet Take 500 mcg by mouth daily.  . [DISCONTINUED] donepezil (ARICEPT) 5 MG tablet Take 1 tablet (5 mg total) by mouth at bedtime.  . [DISCONTINUED] fenofibrate 160 MG tablet TAKE ONE TABLET BY MOUTH ONCE DAILY  . [DISCONTINUED] fluticasone (FLOVENT HFA) 110 MCG/ACT inhaler Inhale 2 puffs into the lungs 2 (two) times daily.   No facility-administered encounter medications on file as of 10/04/2017.      REVIEW OF SYSTEMS  : All other systems reviewed and negative except where noted in the History of Present Illness.   PHYSICAL EXAM: BP 108/60   Pulse 60   Ht 5\' 4"  (1.626 m)   Wt 142 lb (64.4 kg)   BMI 24.37 kg/m  General: Well developed white female in no acute distress Head: Normocephalic and atraumatic Eyes:  Sclerae anicteric, conjunctiva pink. Ears: Normal auditory acuity Lungs: Mild expiratory wheezing noted, mostly on the right.  No increased WOB. Heart: Regular rate and rhythm.  No M/R/G. Abdomen: Soft, non-distended.  BS present.  Non-tender. Musculoskeletal: Symmetrical with no gross  deformities  Skin: No lesions on visible extremities Extremities: No edema  Neurological: Alert oriented x 4, grossly non-focal Psychological:  Alert and cooperative. Normal mood and affect  ASSESSMENT AND PLAN: *84 year old female referred here for elevated ALP.  This has been a one time reading of 166, but also has slightly elevated ALT at 55.  She is on methotrexate for a couple of years so I think that this could be causing this.  Nonetheless, she's also had unintentional weight loss of about 15 pounds over the past several months.  Will repeat LFT's, check a ggt, and check viral hepatitis studies.  I was going to get an ultrasound but with  the weight loss as well I am going to start with a CT scan of the abdomen and pelvis with contrast instead.   *Loose stools alternating with constipation:  Loose stools 1-2 times per day.  Had this back in 2016 as well, symptoms about the same.  Since she alternates with constipation at times I am going to have her start with a daily powder fiber supplement such as Benefiber or Citrucel.  Could consider questran trial.     CC:  Donato Schultz, *  Agree with Ms. Angelyse Heslin's management.  Iva Boop, MD, [facility]

## 2017-10-04 NOTE — Patient Instructions (Signed)
Your provider has requested that you go to the basement level for lab work before leaving today. Press "B" on the elevator. The lab is located at the first door on the left as you exit the elevator.  Use Benefiber or Citrucel daily  You have been scheduled for a CT scan of the abdomen and pelvis at [facility] ([address]---this is in the same building as [facility]).   You are scheduled on 10/18/2017 at 2:00pm. You should arrive 15 minutes prior to your appointment time for registration. Please follow the written instructions below on the day of your exam:  WARNING: IF YOU ARE ALLERGIC TO IODINE/X-RAY DYE, PLEASE NOTIFY RADIOLOGY IMMEDIATELY AT [phone]! YOU WILL BE GIVEN A 13 HOUR PREMEDICATION PREP.  1) Do not eat anything after 10:00am (4 hours prior to your test) 2) You have been given 2 bottles of oral contrast to drink. The solution may taste               better if refrigerated, but do NOT add ice or any other liquid to this solution. Shake             well before drinking.    Drink 1 bottle of contrast @ 12:00pm (2 hours prior to your exam)  Drink 1 bottle of contrast @ 1:00pm (1 hour prior to your exam)  You may take any medications as prescribed with a small amount of water except for the following: Metformin, Glucophage, Glucovance, Avandamet, Riomet, Fortamet, Actoplus Met, Janumet, Glumetza or Metaglip. The above medications must be held the day of the exam AND 48 hours after the exam.  The purpose of you drinking the oral contrast is to aid in the visualization of your intestinal tract. The contrast solution may cause some diarrhea. Before your exam is started, you will be given a small amount of fluid to drink. Depending on your individual set of symptoms, you may also receive an intravenous injection of x-ray contrast/dye. Plan on being at [facility] for 30 minutes or long, depending on the type of exam you are having performed.  If you have  any questions regarding your exam or if you need to reschedule, you may call the CT department at [phone] between the hours of 8:00 am and 5:00 pm, Monday-Friday.  ________________________________________________________________________

## 2017-10-05 DIAGNOSIS — L408 Other psoriasis: Secondary | ICD-10-CM | POA: Diagnosis not present

## 2017-10-05 LAB — HEPATITIS B SURFACE ANTIBODY,QUALITATIVE: HEP B S AB: NONREACTIVE

## 2017-10-05 LAB — HEPATITIS C ANTIBODY
Hepatitis C Ab: NONREACTIVE
SIGNAL TO CUT-OFF: 0.04 (ref <1.00)

## 2017-10-05 LAB — HEPATITIS B SURFACE ANTIGEN: HEP B S AG: NONREACTIVE

## 2017-10-05 NOTE — Progress Notes (Signed)
Back to normal - LFT's

## 2017-10-06 ENCOUNTER — Ambulatory Visit
Admission: RE | Admit: 2017-10-06 | Discharge: 2017-10-06 | Disposition: A | Discharge status: Home / Self Care | Payer: Medicare HMO | Source: Ambulatory Visit | Attending: Family Medicine | Admitting: Family Medicine

## 2017-10-06 DIAGNOSIS — M8588 Other specified disorders of bone density and structure, other site: Secondary | ICD-10-CM | POA: Diagnosis not present

## 2017-10-06 DIAGNOSIS — E2839 Other primary ovarian failure: Secondary | ICD-10-CM

## 2017-10-06 DIAGNOSIS — Z780 Asymptomatic menopausal state: Secondary | ICD-10-CM | POA: Diagnosis not present

## 2017-10-06 DIAGNOSIS — M810 Age-related osteoporosis without current pathological fracture: Secondary | ICD-10-CM | POA: Diagnosis not present

## 2017-10-11 ENCOUNTER — Encounter: Payer: Self-pay | Admitting: Internal Medicine

## 2017-10-11 NOTE — Progress Notes (Signed)
Correction My Chart note re: NL LFT's

## 2017-10-11 NOTE — Telephone Encounter (Signed)
This encounter was created in error - please disregard.

## 2017-10-12 ENCOUNTER — Other Ambulatory Visit: Payer: Self-pay

## 2017-10-12 DIAGNOSIS — Z13820 Encounter for screening for osteoporosis: Secondary | ICD-10-CM

## 2017-10-12 DIAGNOSIS — F411 Generalized anxiety disorder: Secondary | ICD-10-CM

## 2017-10-12 MED ORDER — ALENDRONATE SODIUM 70 MG PO TABS: 70.0000 mg | ORAL_TABLET | ORAL | 11 refills | Status: DC

## 2017-10-18 ENCOUNTER — Inpatient Hospital Stay: Admission: RE | Admit: 2017-10-18 | Payer: Medicare HMO | Source: Ambulatory Visit

## 2017-10-23 ENCOUNTER — Ambulatory Visit (INDEPENDENT_AMBULATORY_CARE_PROVIDER_SITE_OTHER)
Admission: RE | Admit: 2017-10-23 | Discharge: 2017-10-23 | Disposition: A | Discharge status: Home / Self Care | Payer: Medicare HMO | Source: Ambulatory Visit | Attending: Gastroenterology | Admitting: Gastroenterology

## 2017-10-23 DIAGNOSIS — R634 Abnormal weight loss: Secondary | ICD-10-CM

## 2017-10-23 DIAGNOSIS — R7989 Other specified abnormal findings of blood chemistry: Secondary | ICD-10-CM

## 2017-10-23 DIAGNOSIS — R945 Abnormal results of liver function studies: Secondary | ICD-10-CM

## 2017-10-23 MED ORDER — IOPAMIDOL (ISOVUE-300) INJECTION 61%
100.0000 mL | Freq: Once | INTRAVENOUS | Status: AC | PRN
  Administered 2017-10-23: 100 mL via INTRAVENOUS

## 2017-10-23 NOTE — Progress Notes (Signed)
Subjective:   Carrie Hall is a 84 y.o. female who presents for Medicare Annual (Subsequent) preventive examination.  Review of Systems: No ROS.  Medicare Wellness Visit. Additional risk factors are reflected in the social history. Cardiac Risk Factors include: advanced age (>72men, >64 women);dyslipidemia Sleep patterns: Sleeps 8-9 hrs.  Home Safety/Smoke Alarms: Feels safe in home. Smoke alarms in place.  Living environment; residence and Firearm Safety: Lives alone in 1 story home. Sons live extremely close.   Female:     Mammo- utd      Dexa scan- utd       CCS- No longer doing routine screening due to age. Eye- Hx of cataract sx. Wears reading glasses. Eye doctor only as needed.      Objective:     Vitals: BP 106/60 (BP Location: Left Arm, Patient Position: Sitting, Cuff Size: Normal)   Pulse 67   Ht 5' 4.5" (1.638 m)   Wt 141 lb 12.8 oz (64.3 kg)   SpO2 95%   BMI 23.96 kg/m   Body mass index is 23.96 kg/m.  Advanced Directives 10/27/2017 07/15/2016  Does Patient Have a Medical Advance Directive? No No  Would patient like information on creating a medical advance directive? Yes (MAU/Ambulatory/Procedural Areas - Information given) No - Patient declined    Tobacco Social History   Tobacco Use  Smoking Status Former Smoker  . Packs/day: 0.50  . Types: Cigarettes  . Last attempt to quit: 12/06/2003  . Years since quitting: 13.9  Smokeless Tobacco Never Used     Counseling given: Not Answered   Clinical Intake:     Pain : No/denies pain                 Past Medical History:  Diagnosis Date  . Adenomatous colon polyp   . Breast cancer ([place])   . COPD (chronic obstructive pulmonary disease) ([place])   . Diverticulosis   . Hyperlipidemia   . Internal hemorrhoids   . Osteopenia   . Thyroid disease    Past Surgical History:  Procedure Laterality Date  . BREAST LUMPECTOMY Left    radiation only  . BREAST SURGERY    . colon polyps     Family  History  Problem Relation Age of Onset  . Alzheimer's disease Mother   . Cancer Father        lung  . Alzheimer's disease Maternal Grandmother   . Rectal cancer Sister        in her 72s   Social History   Socioeconomic History  . Marital status: Widowed    Spouse name: Not on file  . Number of children: Not on file  . Years of education: Not on file  . Highest education level: Not on file  Occupational History  . Not on file  Social Needs  . Financial resource strain: Not on file  . Food insecurity:    Worry: Not on file    Inability: Not on file  . Transportation needs:    Medical: Not on file    Non-medical: Not on file  Tobacco Use  . Smoking status: Former Smoker    Packs/day: 0.50    Types: Cigarettes    Last attempt to quit: 12/06/2003    Years since quitting: 13.9  . Smokeless tobacco: Never Used  Substance and Sexual Activity  . Alcohol use: No  . Drug use: No  . Sexual activity: Never    Partners: Male  Lifestyle  .  Physical activity:    Days per week: Not on file    Minutes per session: Not on file  . Stress: Not on file  Relationships  . Social connections:    Talks on phone: Not on file    Gets together: Not on file    Attends religious service: Not on file    Active member of club or organization: Not on file    Attends meetings of clubs or organizations: Not on file    Relationship status: Not on file  Other Topics Concern  . Not on file  Social History Narrative  . Not on file    Outpatient Encounter Medications as of 10/27/2017  Medication Sig  . alendronate (FOSAMAX) 70 MG tablet Take 1 tablet (70 mg total) by mouth every 7 (seven) days. Take with a full glass of water on an empty stomach.  . ALPRAZolam (XANAX) 0.25 MG tablet Take 1 tablet (0.25 mg total) by mouth at bedtime as needed for anxiety.  Marland Kitchen atorvastatin (LIPITOR) 10 MG tablet Take 1 tablet (10 mg total) by mouth daily.  . folic acid (FOLVITE) 1 MG tablet Take 1 mg by mouth daily.   Marland Kitchen levothyroxine (SYNTHROID, LEVOTHROID) 100 MCG tablet Take 1 tablet (100 mcg total) by mouth daily.  . methotrexate (50 MG/ML) 1 g injection Inject into the vein once a week.   No facility-administered encounter medications on file as of 10/27/2017.     Activities of Daily Living In your present state of health, do you have any difficulty performing the following activities: 10/27/2017  Hearing? N  Vision? N  Difficulty concentrating or making decisions? N  Walking or climbing stairs? N  Dressing or bathing? N  Doing errands, shopping? N  Preparing Food and eating ? N  Using the Toilet? N  In the past six months, have you accidently leaked urine? N  Do you have problems with loss of bowel control? N  Managing your Medications? N  Managing your Finances? Y  Comment Son takes care of finances  Housekeeping or managing your Housekeeping? N  Some recent data might be hidden    Patient Care Team: Carollee Herter, Alferd Apa, DO as PCP - General    Assessment:   This is a routine wellness examination for Carrie Hall. Physical assessment deferred to PCP.  Exercise Activities and Dietary recommendations Current Exercise Habits: The patient does not participate in regular exercise at present, Exercise limited by: None identified Diet (meal preparation, eat out, water intake, caffeinated beverages, dairy products, fruits and vegetables): 24 hour recall Breakfast: Wheat chex and bananas, coffee Lunch: frozen dinner and soda or water Dinner: Sandwich  Goals    . maintain independence and current health (pt-stated)       Fall Risk Fall Risk  10/27/2017 08/07/2017 07/24/2015 03/07/2014 03/07/2014  Falls in the past year? No No No Yes -  Number falls in past yr: - - - 1 1  Injury with Fall? - - - No No     Depression Screen PHQ 2/9 Scores 10/27/2017 08/07/2017 01/28/2016 07/24/2015  PHQ - 2 Score 0 0 0 0     Cognitive Function MMSE - Mini Mental State Exam 10/27/2017  Orientation to time 4    Orientation to Place 5  Registration 3  Attention/ Calculation 3  Recall 1  Language- name 2 objects 2  Language- repeat 1  Language- follow 3 step command 3  Language- read & follow direction 1  Write a sentence 1  Copy design 1  Total score 25        Immunization History  Administered Date(s) Administered  . Influenza Split 03/15/2011  . Influenza Whole 03/21/2007, 02/04/2008, 02/25/2009, 03/11/2010, 02/07/2012  . Influenza, High Dose Seasonal PF 03/07/2014, 01/28/2016, 02/22/2017  . Influenza,inj,Quad PF,6+ Mos 02/14/2013, 02/26/2015  . Pneumococcal Conjugate-13 03/07/2014  . Pneumococcal Polysaccharide-23 05/15/2006, 12/11/2007  . Td 02/24/2004  . Zoster 11/02/2009    Screening Tests Health Maintenance  Topic Date Due  . TETANUS/TDAP  08/08/2019 (Originally 02/23/2014)  . INFLUENZA VACCINE  12/07/2017  . MAMMOGRAM  03/13/2018  . DEXA SCAN  Completed  . PNA vac Low Risk Adult  Completed      Plan:    Please schedule your next medicare wellness visit with me in 1 yr.  Continue to eat heart healthy diet (full of fruits, vegetables, whole grains, lean protein, water--limit salt, fat, and sugar intake) and increase physical activity as tolerated.  Continue doing brain stimulating activities (puzzles, reading, adult coloring books, staying active) to keep memory sharp.    I have personally reviewed and noted the following in the patient's chart:   . Medical and social history . Use of alcohol, tobacco or illicit drugs  . Current medications and supplements . Functional ability and status . Nutritional status . Physical activity . Advanced directives . List of other physicians . Hospitalizations, surgeries, and ER visits in previous 12 months . Vitals . Screenings to include cognitive, depression, and falls . Referrals and appointments  In addition, I have reviewed and discussed with patient certain preventive protocols, quality metrics, and best practice  recommendations. A written personalized care plan for preventive services as well as general preventive health recommendations were provided to patient.     Shela Nevin, [place]  10/27/2017

## 2017-10-27 ENCOUNTER — Ambulatory Visit (INDEPENDENT_AMBULATORY_CARE_PROVIDER_SITE_OTHER): Payer: Medicare HMO | Admitting: *Deleted

## 2017-10-27 ENCOUNTER — Other Ambulatory Visit: Payer: Self-pay | Admitting: *Deleted

## 2017-10-27 ENCOUNTER — Other Ambulatory Visit (INDEPENDENT_AMBULATORY_CARE_PROVIDER_SITE_OTHER): Payer: Medicare HMO

## 2017-10-27 ENCOUNTER — Encounter: Payer: Self-pay | Admitting: *Deleted

## 2017-10-27 VITALS — BP 106/60 | HR 67 | Ht 64.5 in | Wt 141.8 lb

## 2017-10-27 DIAGNOSIS — E039 Hypothyroidism, unspecified: Secondary | ICD-10-CM

## 2017-10-27 DIAGNOSIS — Z0000 Encounter for general adult medical examination without abnormal findings: Secondary | ICD-10-CM | POA: Diagnosis not present

## 2017-10-27 DIAGNOSIS — I10 Essential (primary) hypertension: Secondary | ICD-10-CM | POA: Diagnosis not present

## 2017-10-27 LAB — COMPREHENSIVE METABOLIC PANEL
ALT: 10 U/L (ref 0–35)
AST: 14 U/L (ref 0–37)
Albumin: 4.1 g/dL (ref 3.5–5.2)
Alkaline Phosphatase: 79 U/L (ref 39–117)
BILIRUBIN TOTAL: 0.5 mg/dL (ref 0.2–1.2)
BUN: 17 mg/dL (ref 6–23)
CHLORIDE: 102 meq/L (ref 96–112)
CO2: 34 meq/L — AB (ref 19–32)
Calcium: 9.5 mg/dL (ref 8.4–10.5)
Creatinine, Ser: 0.74 mg/dL (ref 0.40–1.20)
GFR: 79.89 mL/min (ref >60.00)
GLUCOSE: 99 mg/dL (ref 70–99)
Potassium: 4.9 mEq/L (ref 3.5–5.1)
Sodium: 142 mEq/L (ref 135–145)
Total Protein: 6.4 g/dL (ref 6.0–8.3)

## 2017-10-27 LAB — TSH: TSH: 0.71 u[IU]/mL (ref 0.35–4.50)

## 2017-10-27 NOTE — Addendum Note (Signed)
Addended by: Harl Bowie on: 10/27/2017 08:46 AM   Modules accepted: Orders

## 2017-10-27 NOTE — Patient Instructions (Signed)
Please schedule your next medicare wellness visit with me in 1 yr.  Continue to eat heart healthy diet (full of fruits, vegetables, whole grains, lean protein, water--limit salt, fat, and sugar intake) and increase physical activity as tolerated.  Continue doing brain stimulating activities (puzzles, reading, adult coloring books, staying active) to keep memory sharp.    Carrie Hall , Thank you for taking time to come for your Medicare Wellness Visit. I appreciate your ongoing commitment to your health goals. Please review the following plan we discussed and let me know if I can assist you in the future.   These are the goals we discussed: Goals    . maintain independence and current health (pt-stated)       This is a list of the screening recommended for you and due dates:  Health Maintenance  Topic Date Due  . Tetanus Vaccine  08/08/2019*  . Flu Shot  12/07/2017  . Mammogram  03/13/2018  . DEXA scan (bone density measurement)  Completed  . Pneumonia vaccines  Completed  *Topic was postponed. The date shown is not the original due date.     Health Maintenance for Postmenopausal Women Menopause is a normal process in which your reproductive ability comes to an end. This process happens gradually over a span of months to years, usually between the ages of 38 and 41. Menopause is complete when you have missed 12 consecutive menstrual periods. It is important to talk with your health care provider about some of the most common conditions that affect postmenopausal women, such as heart disease, cancer, and bone loss (osteoporosis). Adopting a healthy lifestyle and getting preventive care can help to promote your health and wellness. Those actions can also lower your chances of developing some of these common conditions. What should I know about menopause? During menopause, you may experience a number of symptoms, such as:  Moderate-to-severe hot flashes.  Night sweats.  Decrease in  sex drive.  Mood swings.  Headaches.  Tiredness.  Irritability.  Memory problems.  Insomnia.  Choosing to treat or not to treat menopausal changes is an individual decision that you make with your health care provider. What should I know about hormone replacement therapy and supplements? Hormone therapy products are effective for treating symptoms that are associated with menopause, such as hot flashes and night sweats. Hormone replacement carries certain risks, especially as you become older. If you are thinking about using estrogen or estrogen with progestin treatments, discuss the benefits and risks with your health care provider. What should I know about heart disease and stroke? Heart disease, heart attack, and stroke become more likely as you age. This may be due, in part, to the hormonal changes that your body experiences during menopause. These can affect how your body processes dietary fats, triglycerides, and cholesterol. Heart attack and stroke are both medical emergencies. There are many things that you can do to help prevent heart disease and stroke:  Have your blood pressure checked at least every 1-2 years. High blood pressure causes heart disease and increases the risk of stroke.  If you are 82 years old, ask your health care provider if you should take aspirin to prevent a heart attack or a stroke.  Do not use any tobacco products, including cigarettes, chewing tobacco, or electronic cigarettes. If you need help quitting, ask your health care provider.  It is important to eat a healthy diet and maintain a healthy weight. ? Be sure to include plenty of vegetables, fruits,  low-fat dairy products, and lean protein. ? Avoid eating foods that are high in solid fats, added sugars, or salt (sodium).  Get regular exercise. This is one of the most important things that you can do for your health. ? Try to exercise for at least 150 minutes each week. The type of exercise  that you do should increase your heart rate and make you sweat. This is known as moderate-intensity exercise. ? Try to do strengthening exercises at least twice each week. Do these in addition to the moderate-intensity exercise.  Know your numbers.Ask your health care provider to check your cholesterol and your blood glucose. Continue to have your blood tested as directed by your health care provider.  What should I know about cancer screening? There are several types of cancer. Take the following steps to reduce your risk and to catch any cancer development as early as possible. Breast Cancer  Practice breast self-awareness. ? This means understanding how your breasts normally appear and feel. ? It also means doing regular breast self-exams. Let your health care provider know about any changes, no matter how small.  If you are 82 or older, have a clinician do a breast exam (clinical breast exam or CBE) every year. Depending on your age, family history, and medical history, it may be recommended that you also have a yearly breast X-ray (mammogram).  If you have a family history of breast cancer, talk with your health care provider about genetic screening.  If you are at high risk for breast cancer, talk with your health care provider about having an MRI and a mammogram every year.  Breast cancer (BRCA) gene test is recommended for women who have family members with BRCA-related cancers. Results of the assessment will determine the need for genetic counseling and BRCA1 and for BRCA2 testing. BRCA-related cancers include these types: ? Breast. This occurs in males or females. ? Ovarian. ? Tubal. This may also be called fallopian tube cancer. ? Cancer of the abdominal or pelvic lining (peritoneal cancer). ? Prostate. ? Pancreatic.  Cervical, Uterine, and Ovarian Cancer Your health care provider may recommend that you be screened regularly for cancer of the pelvic organs. These include your  ovaries, uterus, and vagina. This screening involves a pelvic exam, which includes checking for microscopic changes to the surface of your cervix (Pap test).  For women ages 21-65, health care providers may recommend a pelvic exam and a Pap test every three years. For women ages 6-65, they may recommend the Pap test and pelvic exam, combined with testing for human papilloma virus (HPV), every five years. Some types of HPV increase your risk of cervical cancer. Testing for HPV may also be done on women of any age who have unclear Pap test results.  Other health care providers may not recommend any screening for nonpregnant women who are considered low risk for pelvic cancer and have no symptoms. Ask your health care provider if a screening pelvic exam is right for you.  If you have had past treatment for cervical cancer or a condition that could lead to cancer, you need Pap tests and screening for cancer for at least 20 years after your treatment. If Pap tests have been discontinued for you, your risk factors (such as having a new sexual partner) need to be reassessed to determine if you should start having screenings again. Some women have medical problems that increase the chance of getting cervical cancer. In these cases, your health care provider may  recommend that you have screening and Pap tests more often.  If you have a family history of uterine cancer or ovarian cancer, talk with your health care provider about genetic screening.  If you have vaginal bleeding after reaching menopause, tell your health care provider.  There are currently no reliable tests available to screen for ovarian cancer.  Lung Cancer Lung cancer screening is recommended for adults 58-25 years old who are at high risk for lung cancer because of a history of smoking. A yearly low-dose CT scan of the lungs is recommended if you:  Currently smoke.  Have a history of at least 30 pack-years of smoking and you currently  smoke or have quit within the past 15 years. A pack-year is smoking an average of one pack of cigarettes per day for one year.  Yearly screening should:  Continue until it has been 15 years since you quit.  Stop if you develop a health problem that would prevent you from having lung cancer treatment.  Colorectal Cancer  This type of cancer can be detected and can often be prevented.  Routine colorectal cancer screening usually begins at age 30 and continues through age 26.  If you have risk factors for colon cancer, your health care provider may recommend that you be screened at an earlier age.  If you have a family history of colorectal cancer, talk with your health care provider about genetic screening.  Your health care provider may also recommend using home test kits to check for hidden blood in your stool.  A small camera at the end of a tube can be used to examine your colon directly (sigmoidoscopy or colonoscopy). This is done to check for the earliest forms of colorectal cancer.  Direct examination of the colon should be repeated every 5-10 years until age 74. However, if early forms of precancerous polyps or small growths are found or if you have a family history or genetic risk for colorectal cancer, you may need to be screened more often.  Skin Cancer  Check your skin from head to toe regularly.  Monitor any moles. Be sure to tell your health care provider: ? About any new moles or changes in moles, especially if there is a change in a mole's shape or color. ? If you have a mole that is larger than the size of a pencil eraser.  If any of your family members has a history of skin cancer, especially at a young age, talk with your health care provider about genetic screening.  Always use sunscreen. Apply sunscreen liberally and repeatedly throughout the day.  Whenever you are outside, protect yourself by wearing long sleeves, pants, a wide-brimmed hat, and  sunglasses.  What should I know about osteoporosis? Osteoporosis is a condition in which bone destruction happens more quickly than new bone creation. After menopause, you may be at an increased risk for osteoporosis. To help prevent osteoporosis or the bone fractures that can happen because of osteoporosis, the following is recommended:  If you are 70-2 years old, get at least 1,000 mg of calcium and at least 600 mg of vitamin D per day.  If you are older than age 87 but younger than age 8, get at least 1,200 mg of calcium and at least 600 mg of vitamin D per day.  If you are older than age 87, get at least 1,200 mg of calcium and at least 800 mg of vitamin D per day.  Smoking and excessive alcohol  intake increase the risk of osteoporosis. Eat foods that are rich in calcium and vitamin D, and do weight-bearing exercises several times each week as directed by your health care provider. What should I know about how menopause affects my mental health? Depression may occur at any age, but it is more common as you become older. Common symptoms of depression include:  Low or sad mood.  Changes in sleep patterns.  Changes in appetite or eating patterns.  Feeling an overall lack of motivation or enjoyment of activities that you previously enjoyed.  Frequent crying spells.  Talk with your health care provider if you think that you are experiencing depression. What should I know about immunizations? It is important that you get and maintain your immunizations. These include:  Tetanus, diphtheria, and pertussis (Tdap) booster vaccine.  Influenza every year before the flu season begins.  Pneumonia vaccine.  Shingles vaccine.  Your health care provider may also recommend other immunizations. This information is not intended to replace advice given to you by your health care provider. Make sure you discuss any questions you have with your health care provider. Document Released:  06/17/2005 Document Revised: 11/13/2015 Document Reviewed: 01/27/2015 Elsevier Interactive Patient Education  2018 [employer].

## 2017-10-27 NOTE — Progress Notes (Signed)
Reviewed  Neils Siracusa R Lowne Chase, DO  

## 2017-10-31 ENCOUNTER — Ambulatory Visit: Payer: Self-pay | Admitting: *Deleted

## 2017-10-31 NOTE — Telephone Encounter (Signed)
Pt wanted to know how to take her alendronate and her synthroid. She had taken the alendronate first and then wait 2 hours and took her synthroid. I advised her to make sure she checks with her pharmacy regarding these 2 medications. Pt voiced understanding.  Will route to [facility]. Request office call patient regarding these meds according to protocol.   Reason for Disposition . Caller has NON-URGENT medication question about med that PCP prescribed and triager unable to answer question  Answer Assessment - Initial Assessment Questions 1. REASON FOR CALL or QUESTION: "What is your reason for calling today?" or "How can I best help you?" or "What question do you have that I can help answer?"     Pt had question regarding her medications.  Protocols used: MEDICATION QUESTION CALL-A-AH, INFORMATION ONLY CALL-A-AH

## 2017-11-01 ENCOUNTER — Encounter: Payer: Self-pay | Admitting: Neurology

## 2017-11-01 ENCOUNTER — Encounter: Payer: Self-pay | Admitting: Gastroenterology

## 2017-11-01 ENCOUNTER — Other Ambulatory Visit: Payer: Self-pay

## 2017-11-01 ENCOUNTER — Ambulatory Visit (INDEPENDENT_AMBULATORY_CARE_PROVIDER_SITE_OTHER): Payer: Medicare HMO | Admitting: Neurology

## 2017-11-01 VITALS — BP 122/58 | HR 69 | Ht 64.5 in | Wt 141.0 lb

## 2017-11-01 DIAGNOSIS — G3184 Mild cognitive impairment, so stated: Secondary | ICD-10-CM

## 2017-11-01 MED ORDER — DONEPEZIL HCL 5 MG PO TABS: ORAL_TABLET | ORAL | 11 refills | Status: DC

## 2017-11-01 NOTE — Progress Notes (Signed)
NEUROLOGY CONSULTATION NOTE  Carrie Hall MRN: [id number] DOB: 1933/08/29  Referring provider: Dr. Roma Schanz Primary care provider: Dr. Roma Schanz  Reason for consult:  Memory loss  Dear Dr Cheri Rous:  Thank you for your kind referral of Carrie Hall for consultation of the above symptoms. Although her history is well known to you, please allow me to reiterate it for the purpose of our medical record. The patient was accompanied to the clinic by her daughter-in-law who also provides collateral information. Records and images were personally reviewed where available.  HISTORY OF PRESENT ILLNESS: This is an 84 year old right-handed woman with a history of hyperlipidemia, hypothyroidism, breast cancer, presenting for evaluation of worsening memory. When asked about her memory, she states she "forgets sometimes." Her daughter-in-law appears hesitant to provide additional information saying she does not want to hurt her feelings, but notes she repeats herself several times the same day. She lives alone, they report she cooks and cleans, no hygiene concerns. Her son has been managing bills for the past 5 years since the patient's husband passed away. She fills her own pillbox and states she forgets a dose once in a while. Her family does not monitor medications so they cannot confirm this. She notes word-finding difficulties. She does not drive. She denies misplacing things frequently or leaving the stove on. She seems more anxious and has prn Xanax, but does not take this regularly. She took a dose last night and this morning nervous about today's visit. Her maternal grandmother, several cousins, and mother had Alzheimer's dementia. No history of significant head injuries or alcohol intake. She had an MMSE at PCP office in April 2017 which was 21/30 and was started on Donepezil but appears to have stopped it, she does not recall any side effects. Last MMSE at PCP office a few days ago  was 25/30. She had an MRI brain without contrast in 08/2015 which I personally reviewed, no acute changes, there was mild to moderate chronic microvascular disease and mild diffuse atrophy.  She denies any headaches, dizziness, vision changes, dysarthria/dysphagia, neck/back pain, focal numbness/tingling/weakness. She has constipation but would get diarrhea with spicy foods.   Laboratory Data: Lab Results  Component Value Date   TSH 0.71 10/27/2017   Lab Results  Component Value Date   [id number] 425 08/25/2015    PAST MEDICAL HISTORY: Past Medical History:  Diagnosis Date  . Adenomatous colon polyp   . Breast cancer ([place])   . COPD (chronic obstructive pulmonary disease) ([place])   . Diverticulosis   . Hyperlipidemia   . Internal hemorrhoids   . Osteopenia   . Thyroid disease     PAST SURGICAL HISTORY: Past Surgical History:  Procedure Laterality Date  . BREAST LUMPECTOMY Left    radiation only  . BREAST SURGERY    . colon polyps      MEDICATIONS: Current Outpatient Medications on File Prior to Visit  Medication Sig Dispense Refill  . alendronate (FOSAMAX) 70 MG tablet Take 1 tablet (70 mg total) by mouth every 7 (seven) days. Take with a full glass of water on an empty stomach. 4 tablet 11  . ALPRAZolam (XANAX) 0.25 MG tablet Take 1 tablet (0.25 mg total) by mouth at bedtime as needed for anxiety. 30 tablet 0  . atorvastatin (LIPITOR) 10 MG tablet Take 1 tablet (10 mg total) by mouth daily. 90 tablet 1  . folic acid (FOLVITE) 1 MG tablet Take 1 mg by mouth daily.    Marland Kitchen  levothyroxine (SYNTHROID, LEVOTHROID) 100 MCG tablet Take 1 tablet (100 mcg total) by mouth daily. 90 tablet 1  . methotrexate (50 MG/ML) 1 g injection Inject into the vein once a week.     No current facility-administered medications on file prior to visit.     ALLERGIES: No Known Allergies  FAMILY HISTORY: Family History  Problem Relation Age of Onset  . Alzheimer's disease Mother   . Cancer Father         lung  . Alzheimer's disease Maternal Grandmother   . Rectal cancer Sister        in her 68s    SOCIAL HISTORY: Social History   Socioeconomic History  . Marital status: Widowed    Spouse name: Not on file  . Number of children: Not on file  . Years of education: Not on file  . Highest education level: Not on file  Occupational History  . Not on file  Social Needs  . Financial resource strain: Not on file  . Food insecurity:    Worry: Not on file    Inability: Not on file  . Transportation needs:    Medical: Not on file    Non-medical: Not on file  Tobacco Use  . Smoking status: Former Smoker    Packs/day: 0.50    Types: Cigarettes    Last attempt to quit: 12/06/2003    Years since quitting: 13.9  . Smokeless tobacco: Never Used  Substance and Sexual Activity  . Alcohol use: No  . Drug use: No  . Sexual activity: Never    Partners: Male  Lifestyle  . Physical activity:    Days per week: Not on file    Minutes per session: Not on file  . Stress: Not on file  Relationships  . Social connections:    Talks on phone: Not on file    Gets together: Not on file    Attends religious service: Not on file    Active member of club or organization: Not on file    Attends meetings of clubs or organizations: Not on file    Relationship status: Not on file  . Intimate partner violence:    Fear of current or ex partner: Not on file    Emotionally abused: Not on file    Physically abused: Not on file    Forced sexual activity: Not on file  Other Topics Concern  . Not on file  Social History Narrative  . Not on file    REVIEW OF SYSTEMS: Constitutional: No fevers, chills, or sweats, no generalized fatigue, change in appetite Eyes: No visual changes, double vision, eye pain Ear, nose and throat: No hearing loss, ear pain, nasal congestion, sore throat Cardiovascular: No chest pain, palpitations Respiratory:  No shortness of breath at rest or with exertion,  wheezes GastrointestinaI: No nausea, vomiting, diarrhea, abdominal pain, fecal incontinence Genitourinary:  No dysuria, urinary retention or frequency Musculoskeletal:  No neck pain, back pain Integumentary: No rash, pruritus, skin lesions Neurological: as above Psychiatric: No depression, insomnia, anxiety Endocrine: No palpitations, fatigue, diaphoresis, mood swings, change in appetite, change in weight, increased thirst Hematologic/Lymphatic:  No anemia, purpura, petechiae. Allergic/Immunologic: no itchy/runny eyes, nasal congestion, recent allergic reactions, rashes  PHYSICAL EXAM: Vitals:   11/01/17 1037  BP: (!) 122/58  Pulse: 69  SpO2: 96%   General: No acute distress Head:  Normocephalic/atraumatic Eyes: Fundoscopic exam shows bilateral sharp discs, no vessel changes, exudates, or hemorrhages Neck: supple, no paraspinal tenderness, full  range of motion Back: No paraspinal tenderness Heart: regular rate and rhythm Lungs: Clear to auscultation bilaterally. Vascular: No carotid bruits. Skin/Extremities: No rash, no edema Neurological Exam: Mental status: alert and oriented to person, place, and time, no dysarthria or aphasia, Fund of knowledge is appropriate.  Recent and remote memory are impaired. Attention and concentration are normal.    Able to name objects and repeat phrases. CDT 5/5 MMSE - Mini Mental State Exam 11/01/2017 10/27/2017  Orientation to time 5 4  Orientation to Place 4 5  Registration 3 3  Attention/ Calculation 3 3  Recall 1 1  Language- name 2 objects 2 2  Language- repeat 1 1  Language- follow 3 step command 3 3  Language- read & follow direction 1 1  Write a sentence 1 1  Copy design 1 1  Total score 25 25   Cranial nerves: CN I: not tested CN II: pupils equal, round and reactive to light, visual fields intact, fundi unremarkable. CN III, IV, VI:  full range of motion, no nystagmus, no ptosis CN V: facial sensation intact CN VII: upper and  lower face symmetric CN VIII: hearing intact to finger rub CN IX, X: gag intact, uvula midline CN XI: sternocleidomastoid and trapezius muscles intact CN XII: tongue midline Bulk & Tone: normal, no fasciculations. Motor: 5/5 throughout with no pronator drift. Sensation: intact to light touch, cold, pin, vibration and joint position sense.  No extinction to double simultaneous stimulation.  Romberg test negative Deep Tendon Reflexes: +2 throughout, no ankle clonus Plantar responses: downgoing bilaterally Cerebellar: no incoordination on finger to nose, heel to shin. No dysdiadochokinesia Gait: wide-based, no ataxia, mild difficulty with tandem walk Tremor: none  IMPRESSION: This is an 84 year old right-handed woman with a history of hyperlipidemia, hypothyroidism, breast cancer, presenting for evaluation of memory loss. She continues to live alone and appears to manage complex tasks independently (she has never driven, bills managed by son taking over from deceased husband), but her daughter-in-law appears hesitant to provide additional information in front of the patient. Her MMSE today is 25/30, neurological exam normal. Symptoms suggestive of mild cognitive impairment, she was advised to resume donepezil 5mg  daily. I discussed having her family check behind her to ensure medication compliance. We also discussed mood changes that can occur with memory changes, if anxiety worsens, discuss other medication options aside from benzodiazepines with PCP. We discussed the importance of control of vascular risk factors, physical exercise, and brain stimulation exercises for brain health. She will follow-up in 6 months and knows to call for any changes.  Thank you for allowing me to participate in the care of this patient. Please do not hesitate to call for any questions or concerns.   Ellouise Newer, M.D.  CC: Dr. Cheri Rous

## 2017-11-01 NOTE — Patient Instructions (Signed)
1. Restart Donepezil 5mg  daily 2. Recommend having family start checking behind you with medications 3. If anxiety worsens, call Dr. Cheri Rous and medication may be started to help better 4. Follow-up in 6 months, call for any changes  FALL PRECAUTIONS: Be cautious when walking. Scan the area for obstacles that may increase the risk of trips and falls. When getting up in the mornings, sit up at the edge of the bed for a few minutes before getting out of bed. Consider elevating the bed at the head end to avoid drop of blood pressure when getting up. Walk always in a well-lit room (use night lights in the walls). Avoid area rugs or power cords from appliances in the middle of the walkways. Use a walker or a cane if necessary and consider physical therapy for balance exercise. Get your eyesight checked regularly.  HOME SAFETY: Consider the safety of the kitchen when operating appliances like stoves, microwave oven, and blender. Consider having supervision and share cooking responsibilities until no longer able to participate in those. Accidents with firearms and other hazards in the house should be identified and addressed as well.  ABILITY TO BE LEFT ALONE: If patient is unable to contact 911 operator, consider using LifeLine, or when the need is there, arrange for someone to stay with patients. Smoking is a fire hazard, consider supervision or cessation. Risk of wandering should be assessed by caregiver and if detected at any point, supervision and safe proof recommendations should be instituted.  MEDICATION SUPERVISION: Inability to self-administer medication needs to be constantly addressed. Implement a mechanism to ensure safe administration of the medications.  RECOMMENDATIONS FOR ALL PATIENTS WITH MEMORY PROBLEMS: 1. Continue to exercise (Recommend 30 minutes of walking everyday, or 3 hours every week) 2. Increase social interactions - continue going to [place] and enjoy social gatherings with friends  and family 3. Eat healthy, avoid fried foods and eat more fruits and vegetables 4. Maintain adequate blood pressure, blood sugar, and blood cholesterol level. Reducing the risk of stroke and cardiovascular disease also helps promoting better memory. 5. Avoid stressful situations. Live a simple life and avoid aggravations. Organize your time and prepare for the next day in anticipation. 6. Sleep well, avoid any interruptions of sleep and avoid any distractions in the bedroom that may interfere with adequate sleep quality 7. Avoid sugar, avoid sweets as there is a strong link between excessive sugar intake, diabetes, and cognitive impairment We discussed the Mediterranean diet, which has been shown to help patients reduce the risk of progressive memory disorders and reduces cardiovascular risk. This includes eating fish, eat fruits and green leafy vegetables, nuts like almonds and hazelnuts, walnuts, and also use olive oil. Avoid fast foods and fried foods as much as possible. Avoid sweets and sugar as sugar use has been linked to worsening of memory function.  There is always a concern of gradual progression of memory problems. If this is the case, then we may need to adjust level of care according to patient needs. Support, both to the patient and caregiver, should then be put into place.

## 2017-11-01 NOTE — Telephone Encounter (Signed)
Hilda Blades patients daughter in law calling from [phone] to see if an email or note can be sent to patients dermatologist so her medication can be represcribed.

## 2017-11-02 NOTE — Telephone Encounter (Signed)
That should be fine. 

## 2017-11-02 NOTE — Telephone Encounter (Signed)
Patient stated that she spoke with pharmacist and they told her to take thyroid med first.

## 2017-11-10 ENCOUNTER — Encounter: Payer: Self-pay | Admitting: Neurology

## 2017-11-10 DIAGNOSIS — G3184 Mild cognitive impairment, so stated: Secondary | ICD-10-CM | POA: Insufficient documentation

## 2017-12-15 ENCOUNTER — Ambulatory Visit: Payer: Medicare HMO | Admitting: Family Medicine

## 2017-12-22 ENCOUNTER — Ambulatory Visit: Payer: Medicare HMO | Admitting: Family Medicine

## 2018-01-01 ENCOUNTER — Ambulatory Visit (INDEPENDENT_AMBULATORY_CARE_PROVIDER_SITE_OTHER): Payer: Medicare HMO | Admitting: Family Medicine

## 2018-01-01 ENCOUNTER — Encounter: Payer: Self-pay | Admitting: Family Medicine

## 2018-01-01 VITALS — BP 124/55 | HR 66 | Temp 98.1°F | Resp 16 | Ht 65.0 in | Wt 145.4 lb

## 2018-01-01 DIAGNOSIS — F411 Generalized anxiety disorder: Secondary | ICD-10-CM

## 2018-01-01 DIAGNOSIS — R634 Abnormal weight loss: Secondary | ICD-10-CM | POA: Diagnosis not present

## 2018-01-01 DIAGNOSIS — Z79899 Other long term (current) drug therapy: Secondary | ICD-10-CM | POA: Diagnosis not present

## 2018-01-01 DIAGNOSIS — G3184 Mild cognitive impairment, so stated: Secondary | ICD-10-CM

## 2018-01-01 DIAGNOSIS — R69 Illness, unspecified: Secondary | ICD-10-CM | POA: Diagnosis not present

## 2018-01-01 DIAGNOSIS — R945 Abnormal results of liver function studies: Secondary | ICD-10-CM | POA: Diagnosis not present

## 2018-01-01 DIAGNOSIS — R7989 Other specified abnormal findings of blood chemistry: Secondary | ICD-10-CM

## 2018-01-01 NOTE — Assessment & Plan Note (Signed)
Has resolved con't to watch

## 2018-01-01 NOTE — Progress Notes (Signed)
Patient ID: Carrie Hall, female    DOB: 1934/03/22  Age: 84 y.o. MRN: [id number]    Subjective:  Subjective  HPI Carrie Hall presents for weight check --- pt states her appetite is better and she is eating.  Her weight is up to 145 lbs.  No other complaints Pt has seen GI--- for elevated alk phos.  Repeat cmp -- alk phos normal   Review of Systems  Constitutional: Negative for chills and fever.  HENT: Negative for congestion and hearing loss.   Eyes: Negative for discharge.  Respiratory: Negative for cough and shortness of breath.   Cardiovascular: Negative for chest pain, palpitations and leg swelling.  Gastrointestinal: Negative for abdominal pain, blood in stool, constipation, diarrhea, nausea and vomiting.  Genitourinary: Negative for dysuria, frequency, hematuria and urgency.  Musculoskeletal: Negative for back pain and myalgias.  Skin: Negative for rash.  Allergic/Immunologic: Negative for environmental allergies.  Neurological: Negative for dizziness, weakness and headaches.  Hematological: Does not bruise/bleed easily.  Psychiatric/Behavioral: Negative for suicidal ideas. The patient is not nervous/anxious.     History Past Medical History:  Diagnosis Date  . Adenomatous colon polyp   . Breast cancer ([place])   . COPD (chronic obstructive pulmonary disease) ([place])   . Diverticulosis   . Hyperlipidemia   . Internal hemorrhoids   . Osteopenia   . Thyroid disease     She has a past surgical history that includes colon polyps; Breast surgery; and Breast lumpectomy (Left).   Her family history includes Alzheimer's disease in her maternal grandmother and mother; Cancer in her father; Rectal cancer in her sister.She reports that she quit smoking about 14 years ago. Her smoking use included cigarettes. She smoked 0.50 packs per day. She has never used smokeless tobacco. She reports that she does not drink alcohol or use drugs.  Current Outpatient Medications on File Prior  to Visit  Medication Sig Dispense Refill  . alendronate (FOSAMAX) 70 MG tablet Take 1 tablet (70 mg total) by mouth every 7 (seven) days. Take with a full glass of water on an empty stomach. 4 tablet 11  . ALPRAZolam (XANAX) 0.25 MG tablet Take 1 tablet (0.25 mg total) by mouth at bedtime as needed for anxiety. 30 tablet 0  . atorvastatin (LIPITOR) 10 MG tablet Take 1 tablet (10 mg total) by mouth daily. 90 tablet 1  . donepezil (ARICEPT) 5 MG tablet Take 1 tablet daily 30 tablet 11  . folic acid (FOLVITE) 1 MG tablet Take 1 mg by mouth daily.    Marland Kitchen levothyroxine (SYNTHROID, LEVOTHROID) 100 MCG tablet Take 1 tablet (100 mcg total) by mouth daily. 90 tablet 1  . methotrexate (50 MG/ML) 1 g injection Inject into the vein once a week.     No current facility-administered medications on file prior to visit.      Objective:  Objective  Physical Exam  Constitutional: She is oriented to person, place, and time. She appears well-developed and well-nourished.  HENT:  Head: Normocephalic and atraumatic.  Eyes: Conjunctivae and EOM are normal.  Neck: Normal range of motion. Neck supple. No JVD present. Carotid bruit is not present. No thyromegaly present.  Cardiovascular: Normal rate, regular rhythm and normal heart sounds.  No murmur heard. Pulmonary/Chest: Effort normal and breath sounds normal. No respiratory distress. She has no wheezes. She has no rales. She exhibits no tenderness.  Musculoskeletal: She exhibits no edema.  Neurological: She is alert and oriented to person, place, and time.  Psychiatric:  She has a normal mood and affect.  Nursing note and vitals reviewed.  BP (!) 124/55 (BP Location: Right Arm, Cuff Size: Normal)   Pulse 66   Temp 98.1 F (36.7 C) (Oral)   Resp 16   Ht _0  (1.651 m)   Wt 145 lb 6.4 oz (66 kg)   SpO2 96%   BMI 24.20 kg/m  Wt Readings from Last 3 Encounters:  01/01/18 145 lb 6.4 oz (66 kg)  11/01/17 141 lb (64 kg)  10/27/17 141 lb 12.8 oz (64.3 kg)       Lab Results  Component Value Date   WBC 7.4 08/07/2017   HGB 13.5 08/07/2017   HCT 40.1 08/07/2017   PLT 402.0 (H) 08/07/2017   GLUCOSE 99 10/27/2017   CHOL 143 08/07/2017   TRIG 128.0 08/07/2017   HDL 39.20 08/07/2017   LDLDIRECT 103.3 03/07/2014   LDLCALC 78 08/07/2017   ALT 10 10/27/2017   AST 14 10/27/2017   NA 142 10/27/2017   K 4.9 10/27/2017   CL 102 10/27/2017   CREATININE 0.74 10/27/2017   BUN 17 10/27/2017   CO2 34 (H) 10/27/2017   TSH 0.71 10/27/2017   HGBA1C 6.1 07/20/2012    Ct Abdomen Pelvis W Contrast  Result Date: 10/23/2017 CLINICAL DATA:  Weight loss since February with decreased appetite. Elevated liver function tests. Evaluate for liver disease. EXAM: CT ABDOMEN AND PELVIS WITH CONTRAST TECHNIQUE: Multidetector CT imaging of the abdomen and pelvis was performed using the standard protocol following bolus administration of intravenous contrast. CONTRAST:  131m ISOVUE-300 IOPAMIDOL (ISOVUE-300) INJECTION 61% COMPARISON:  None FINDINGS: Lower chest: Clear lung bases. Normal heart size without pericardial or pleural effusion. Hepatobiliary: Mild motion degradation in the upper abdomen. Normal liver. Normal gallbladder, without biliary ductal dilatation. Pancreas: Normal, without mass or ductal dilatation. Spleen: Normal in size, without focal abnormality. Adrenals/Urinary Tract: Mild bilateral adrenal thickening. Mild right renal cortical thinning. No hydronephrosis. Normal urinary bladder. Stomach/Bowel: Normal stomach, without wall thickening. Extensive colonic diverticulosis. Sigmoid wall thickening is likely due to muscular hypertrophy of diverticulosis. Normal terminal ileum and appendix. Normal small bowel. Vascular/Lymphatic: Advanced aortic and branch vessel atherosclerosis. No abdominopelvic adenopathy. Reproductive: Central uterine hypoattenuation on the order of 7 mm on sagittal image 56. No adnexal mass. Other: No significant free fluid.  Mild pelvic  floor laxity. Musculoskeletal: Moderate osteopenia. Multilevel lumbosacral disc bulges. IMPRESSION: 1. No acute process in the abdomen or pelvis. No explanation for patient's symptoms and no liver abnormality. 2. Mild motion degradation throughout the upper abdomen. 3.  Aortic Atherosclerosis (ICD10-I70.0). 4. Central uterine hypoattenuation. Correlate with any symptoms of postmenopausal bleeding. Especially if there are any such symptoms, consider dedicated pelvic ultrasound. Electronically Signed   By: KAbigail MiyamotoM.D.   On: 10/23/2017 15:45     Assessment & Plan:  Plan  I am having Carrie Hall maintain her folic acid, methotrexate, levothyroxine, atorvastatin, ALPRAZolam, alendronate, and donepezil.  No orders of the defined types were placed in this encounter.   Problem List Items Addressed This Visit      Unprioritized   Elevated LFTs    Has resolved con't to watch      Generalized anxiety disorder - Primary    Stable con't prn xanax      Relevant Orders   Pain Mgmt, Profile 8 w/Conf, U   Mild cognitive impairment    Per neuro      Weight loss, non-intentional    Appetite is back Pt  is doing well        Other Visit Diagnoses    High risk medication use       Relevant Orders   Pain Mgmt, Profile 8 w/Conf, U      Follow-up: Return if symptoms worsen or fail to improve.  [name], DO

## 2018-01-01 NOTE — Assessment & Plan Note (Signed)
Appetite is back Pt is doing well

## 2018-01-01 NOTE — Assessment & Plan Note (Signed)
Stable  con't prn xanax  

## 2018-01-01 NOTE — Assessment & Plan Note (Signed)
Per neuro 

## 2018-01-01 NOTE — Patient Instructions (Signed)

## 2018-01-03 LAB — PAIN MGMT, PROFILE 8 W/CONF, U
6 ACETYLMORPHINE: NEGATIVE ng/mL (ref <10)
ALCOHOL METABOLITES: NEGATIVE ng/mL (ref <500)
ALPHAHYDROXYMIDAZOLAM: NEGATIVE ng/mL (ref <50)
AMPHETAMINES: NEGATIVE ng/mL (ref <500)
Alphahydroxyalprazolam: 33 ng/mL — ABNORMAL HIGH (ref <25)
Alphahydroxytriazolam: NEGATIVE ng/mL (ref <50)
Aminoclonazepam: NEGATIVE ng/mL (ref <25)
BENZODIAZEPINES: POSITIVE ng/mL — AB (ref <100)
Buprenorphine, Urine: NEGATIVE ng/mL (ref <5)
COCAINE METABOLITE: NEGATIVE ng/mL (ref <150)
Creatinine: 192.7 mg/dL
Hydroxyethylflurazepam: NEGATIVE ng/mL (ref <50)
LORAZEPAM: NEGATIVE ng/mL (ref <50)
MARIJUANA METABOLITE: NEGATIVE ng/mL (ref <20)
MDMA: NEGATIVE ng/mL (ref <500)
NORDIAZEPAM: NEGATIVE ng/mL (ref <50)
OPIATES: NEGATIVE ng/mL (ref <100)
OXIDANT: NEGATIVE ug/mL (ref <200)
Oxazepam: NEGATIVE ng/mL (ref <50)
Oxycodone: NEGATIVE ng/mL (ref <100)
Temazepam: NEGATIVE ng/mL (ref <50)
pH: 6.57 (ref 4.5–9.0)

## 2018-01-13 DIAGNOSIS — R69 Illness, unspecified: Secondary | ICD-10-CM | POA: Diagnosis not present

## 2018-02-12 ENCOUNTER — Emergency Department (HOSPITAL_BASED_OUTPATIENT_CLINIC_OR_DEPARTMENT_OTHER)
Admission: EM | Admit: 2018-02-12 | Discharge: 2018-02-13 | Disposition: A | Discharge status: Home / Self Care | Payer: Medicare HMO | Attending: Emergency Medicine | Admitting: Emergency Medicine

## 2018-02-12 ENCOUNTER — Ambulatory Visit: Payer: Self-pay

## 2018-02-12 ENCOUNTER — Ambulatory Visit (INDEPENDENT_AMBULATORY_CARE_PROVIDER_SITE_OTHER): Payer: Medicare HMO | Admitting: Family Medicine

## 2018-02-12 ENCOUNTER — Encounter (HOSPITAL_BASED_OUTPATIENT_CLINIC_OR_DEPARTMENT_OTHER): Payer: Self-pay | Admitting: Emergency Medicine

## 2018-02-12 ENCOUNTER — Encounter: Payer: Self-pay | Admitting: Family Medicine

## 2018-02-12 ENCOUNTER — Other Ambulatory Visit: Payer: Self-pay

## 2018-02-12 VITALS — BP 128/59 | HR 64 | Temp 98.5°F | Resp 16 | Ht 65.0 in | Wt 147.2 lb

## 2018-02-12 DIAGNOSIS — T7840XA Allergy, unspecified, initial encounter: Secondary | ICD-10-CM

## 2018-02-12 DIAGNOSIS — Z853 Personal history of malignant neoplasm of breast: Secondary | ICD-10-CM | POA: Insufficient documentation

## 2018-02-12 DIAGNOSIS — Y828 Other medical devices associated with adverse incidents: Secondary | ICD-10-CM | POA: Diagnosis not present

## 2018-02-12 DIAGNOSIS — Z23 Encounter for immunization: Secondary | ICD-10-CM | POA: Diagnosis not present

## 2018-02-12 DIAGNOSIS — Z79899 Other long term (current) drug therapy: Secondary | ICD-10-CM | POA: Diagnosis not present

## 2018-02-12 DIAGNOSIS — E038 Other specified hypothyroidism: Secondary | ICD-10-CM | POA: Diagnosis not present

## 2018-02-12 DIAGNOSIS — E785 Hyperlipidemia, unspecified: Secondary | ICD-10-CM | POA: Diagnosis not present

## 2018-02-12 DIAGNOSIS — R0602 Shortness of breath: Secondary | ICD-10-CM | POA: Diagnosis not present

## 2018-02-12 DIAGNOSIS — R062 Wheezing: Secondary | ICD-10-CM | POA: Insufficient documentation

## 2018-02-12 DIAGNOSIS — E039 Hypothyroidism, unspecified: Secondary | ICD-10-CM | POA: Diagnosis not present

## 2018-02-12 DIAGNOSIS — Z87891 Personal history of nicotine dependence: Secondary | ICD-10-CM | POA: Insufficient documentation

## 2018-02-12 DIAGNOSIS — T8090XA Unspecified complication following infusion and therapeutic injection, initial encounter: Secondary | ICD-10-CM

## 2018-02-12 DIAGNOSIS — J449 Chronic obstructive pulmonary disease, unspecified: Secondary | ICD-10-CM | POA: Insufficient documentation

## 2018-02-12 DIAGNOSIS — R21 Rash and other nonspecific skin eruption: Secondary | ICD-10-CM | POA: Insufficient documentation

## 2018-02-12 DIAGNOSIS — L0889 Other specified local infections of the skin and subcutaneous tissue: Secondary | ICD-10-CM | POA: Diagnosis not present

## 2018-02-12 DIAGNOSIS — I10 Essential (primary) hypertension: Secondary | ICD-10-CM | POA: Insufficient documentation

## 2018-02-12 DIAGNOSIS — T8089XA Other complications following infusion, transfusion and therapeutic injection, initial encounter: Secondary | ICD-10-CM | POA: Insufficient documentation

## 2018-02-12 MED ORDER — ALBUTEROL SULFATE HFA 108 (90 BASE) MCG/ACT IN AERS: 2.0000 | INHALATION_SPRAY | RESPIRATORY_TRACT | 0 refills | Status: DC | PRN

## 2018-02-12 MED ORDER — RANITIDINE HCL 150 MG PO CAPS: 150.0000 mg | ORAL_CAPSULE | Freq: Two times a day (BID) | ORAL | 0 refills | Status: DC

## 2018-02-12 MED ORDER — PREDNISONE 20 MG PO TABS: 40.0000 mg | ORAL_TABLET | Freq: Every day | ORAL | 0 refills | Status: AC

## 2018-02-12 MED ORDER — EPINEPHRINE 0.3 MG/0.3ML IJ SOAJ: 0.3000 mg | Freq: Once | INTRAMUSCULAR | 0 refills | Status: AC

## 2018-02-12 MED ORDER — DIPHENHYDRAMINE HCL 50 MG/ML IJ SOLN
12.5000 mg | Freq: Once | INTRAMUSCULAR | Status: AC
  Administered 2018-02-12: 12.5 mg via INTRAVENOUS
  Filled 2018-02-12: qty 1

## 2018-02-12 MED ORDER — HYDROCORTISONE 2.5 % EX LOTN: TOPICAL_LOTION | Freq: Two times a day (BID) | CUTANEOUS | 0 refills | Status: AC

## 2018-02-12 MED ORDER — ALBUTEROL (5 MG/ML) CONTINUOUS INHALATION SOLN
10.0000 mg/h | INHALATION_SOLUTION | Freq: Once | RESPIRATORY_TRACT | Status: AC
  Administered 2018-02-12: 10 mg/h via RESPIRATORY_TRACT
  Filled 2018-02-12: qty 20

## 2018-02-12 MED ORDER — PREDNISONE 50 MG PO TABS
60.0000 mg | ORAL_TABLET | Freq: Once | ORAL | Status: AC
  Administered 2018-02-12: 60 mg via ORAL
  Filled 2018-02-12: qty 1

## 2018-02-12 MED ORDER — IPRATROPIUM BROMIDE 0.02 % IN SOLN
1.0000 mg | Freq: Once | RESPIRATORY_TRACT | Status: AC
  Administered 2018-02-12: 1 mg via RESPIRATORY_TRACT
  Filled 2018-02-12: qty 5

## 2018-02-12 MED ORDER — FAMOTIDINE IN NACL 20-0.9 MG/50ML-% IV SOLN
20.0000 mg | Freq: Once | INTRAVENOUS | Status: AC
  Administered 2018-02-12: 20 mg via INTRAVENOUS
  Filled 2018-02-12: qty 50

## 2018-02-12 NOTE — Discharge Instructions (Addendum)
Take the medications as prescribed.  Call your doctor in the morning to set up follow-up this week.  If you have worsening redness/pain, shortness of breath, tongue swelling, or other concerning symptoms, call 911 or return to the ER immediately   You have been seen in the Emergency Department (ED) today for an allergic reaction.  You have been stable throughout your stay in the Emergency Department.  Please take your medications as prescribed and follow up with your doctor as indicated.   If the source of your allergic reaction is not known, keep an exposure diary documenting the food you eat, any new lotions/soaps, or other potential triggers, and discuss this with your doctor.  Return to the Emergency Department (ED) if you experience any worsening or new symptoms that concern you.

## 2018-02-12 NOTE — Assessment & Plan Note (Signed)
Tolerating statin, encouraged heart healthy diet, avoid trans fats, minimize simple carbs and saturated fats. Increase exercise as tolerated 

## 2018-02-12 NOTE — Patient Instructions (Signed)

## 2018-02-12 NOTE — Progress Notes (Signed)
Patient ID: Carrie Hall, female    DOB: 04-May-1934  Age: 84 y.o. MRN: [id number]    Subjective:  Subjective  HPI Carrie Hall presents for f/u weight,  cholesterol and she needs her flu shot.  No complaints.    Review of Systems  Constitutional: Negative for chills and fever.  HENT: Negative for congestion and hearing loss.   Eyes: Negative for discharge.  Respiratory: Negative for cough and shortness of breath.   Cardiovascular: Negative for chest pain, palpitations and leg swelling.  Gastrointestinal: Negative for abdominal pain, blood in stool, constipation, diarrhea, nausea and vomiting.  Genitourinary: Negative for dysuria, frequency, hematuria and urgency.  Musculoskeletal: Negative for back pain and myalgias.  Skin: Negative for rash.  Allergic/Immunologic: Negative for environmental allergies.  Neurological: Negative for dizziness, weakness and headaches.  Hematological: Does not bruise/bleed easily.  Psychiatric/Behavioral: Negative for suicidal ideas. The patient is not nervous/anxious.     History Past Medical History:  Diagnosis Date  . Adenomatous colon polyp   . Breast cancer ([place])   . COPD (chronic obstructive pulmonary disease) ([place])   . Diverticulosis   . Hyperlipidemia   . Internal hemorrhoids   . Osteopenia   . Thyroid disease     She has a past surgical history that includes colon polyps; Breast surgery; and Breast lumpectomy (Left).   Her family history includes Alzheimer's disease in her maternal grandmother and mother; Cancer in her father; Rectal cancer in her sister.She reports that she quit smoking about 14 years ago. Her smoking use included cigarettes. She smoked 0.50 packs per day. She has never used smokeless tobacco. She reports that she does not drink alcohol or use drugs.  Current Outpatient Medications on File Prior to Visit  Medication Sig Dispense Refill  . alendronate (FOSAMAX) 70 MG tablet Take 1 tablet (70 mg total) by mouth every  7 (seven) days. Take with a full glass of water on an empty stomach. 4 tablet 11  . ALPRAZolam (XANAX) 0.25 MG tablet Take 1 tablet (0.25 mg total) by mouth at bedtime as needed for anxiety. 30 tablet 0  . atorvastatin (LIPITOR) 10 MG tablet Take 1 tablet (10 mg total) by mouth daily. 90 tablet 1  . donepezil (ARICEPT) 5 MG tablet Take 1 tablet daily 30 tablet 11  . folic acid (FOLVITE) 1 MG tablet Take 1 mg by mouth daily.    Marland Kitchen levothyroxine (SYNTHROID, LEVOTHROID) 100 MCG tablet Take 1 tablet (100 mcg total) by mouth daily. 90 tablet 1  . methotrexate (50 MG/ML) 1 g injection Inject into the vein once a week.     No current facility-administered medications on file prior to visit.      Objective:  Objective  Physical Exam  Constitutional: She is oriented to person, place, and time. She appears well-developed and well-nourished.  HENT:  Head: Normocephalic and atraumatic.  Eyes: Conjunctivae and EOM are normal.  Neck: Normal range of motion. Neck supple. No JVD present. Carotid bruit is not present. No thyromegaly present.  Cardiovascular: Normal rate, regular rhythm and normal heart sounds.  No murmur heard. Pulmonary/Chest: Effort normal and breath sounds normal. No respiratory distress. She has no wheezes. She has no rales. She exhibits no tenderness.  Musculoskeletal: She exhibits no edema.  Neurological: She is alert and oriented to person, place, and time.  Psychiatric: She has a normal mood and affect.  Nursing note and vitals reviewed.  BP (!) 128/59 (BP Location: Right Arm, Cuff Size: Normal)  Pulse 64   Temp 98.5 F (36.9 C) (Oral)   Resp 16   Ht 5\' 5"  (1.651 m)   Wt 147 lb 3.2 oz (66.8 kg)   SpO2 96%   BMI 24.50 kg/m  Wt Readings from Last 3 Encounters:  02/12/18 147 lb 3.2 oz (66.8 kg)  01/01/18 145 lb 6.4 oz (66 kg)  11/01/17 141 lb (64 kg)     Lab Results  Component Value Date   WBC 7.4 08/07/2017   HGB 13.5 08/07/2017   HCT 40.1 08/07/2017   PLT 402.0  (H) 08/07/2017   GLUCOSE 99 10/27/2017   CHOL 143 08/07/2017   TRIG 128.0 08/07/2017   HDL 39.20 08/07/2017   LDLDIRECT 103.3 03/07/2014   LDLCALC 78 08/07/2017   ALT 10 10/27/2017   AST 14 10/27/2017   NA 142 10/27/2017   K 4.9 10/27/2017   CL 102 10/27/2017   CREATININE 0.74 10/27/2017   BUN 17 10/27/2017   CO2 34 (H) 10/27/2017   TSH 0.71 10/27/2017   HGBA1C 6.1 07/20/2012    Ct Abdomen Pelvis W Contrast  Result Date: 10/23/2017 CLINICAL DATA:  Weight loss since February with decreased appetite. Elevated liver function tests. Evaluate for liver disease. EXAM: CT ABDOMEN AND PELVIS WITH CONTRAST TECHNIQUE: Multidetector CT imaging of the abdomen and pelvis was performed using the standard protocol following bolus administration of intravenous contrast. CONTRAST:  117mL ISOVUE-300 IOPAMIDOL (ISOVUE-300) INJECTION 61% COMPARISON:  None FINDINGS: Lower chest: Clear lung bases. Normal heart size without pericardial or pleural effusion. Hepatobiliary: Mild motion degradation in the upper abdomen. Normal liver. Normal gallbladder, without biliary ductal dilatation. Pancreas: Normal, without mass or ductal dilatation. Spleen: Normal in size, without focal abnormality. Adrenals/Urinary Tract: Mild bilateral adrenal thickening. Mild right renal cortical thinning. No hydronephrosis. Normal urinary bladder. Stomach/Bowel: Normal stomach, without wall thickening. Extensive colonic diverticulosis. Sigmoid wall thickening is likely due to muscular hypertrophy of diverticulosis. Normal terminal ileum and appendix. Normal small bowel. Vascular/Lymphatic: Advanced aortic and branch vessel atherosclerosis. No abdominopelvic adenopathy. Reproductive: Central uterine hypoattenuation on the order of 7 mm on sagittal image 56. No adnexal mass. Other: No significant free fluid.  Mild pelvic floor laxity. Musculoskeletal: Moderate osteopenia. Multilevel lumbosacral disc bulges. IMPRESSION: 1. No acute process in the  abdomen or pelvis. No explanation for patient's symptoms and no liver abnormality. 2. Mild motion degradation throughout the upper abdomen. 3.  Aortic Atherosclerosis (ICD10-I70.0). 4. Central uterine hypoattenuation. Correlate with any symptoms of postmenopausal bleeding. Especially if there are any such symptoms, consider dedicated pelvic ultrasound. Electronically Signed   By: Abigail Miyamoto M.D.   On: 10/23/2017 15:45     Assessment & Plan:  Plan  I am having Janasha H. Seivert maintain her folic acid, methotrexate, levothyroxine, atorvastatin, ALPRAZolam, alendronate, and donepezil.  No orders of the defined types were placed in this encounter.   Problem List Items Addressed This Visit      Unprioritized   Hyperlipidemia LDL goal <100 - Primary    Tolerating statin, encouraged heart healthy diet, avoid trans fats, minimize simple carbs and saturated fats. Increase exercise as tolerated      Relevant Orders   Lipid panel   TSH   Comprehensive metabolic panel   Hypothyroidism    Check labs con't meds       Other Visit Diagnoses    Need for pneumococcal vaccination       Relevant Orders   Pneumococcal polysaccharide vaccine 23-valent greater than or equal to 2yo subcutaneous/IM (  Completed)      Follow-up: Return in about 6 months (around 08/14/2018), or if symptoms worsen or fail to improve, for fasting, annual exam.  [name], DO

## 2018-02-12 NOTE — Telephone Encounter (Signed)
Call placed to patient who called with C/O itching at the injections site of her pneumonia vaccine she received today Pt denies redness swelling. She denies difficult breathing ,lip swelling. Pt states the itching has gotten better with ice she applied to the area. Care advice read to patient to include any lip swelling tongue or throat swelling call back or go to the ED.  Reason for Disposition . Pneumococcal vaccine reactions  Answer Assessment - Initial Assessment Questions 1. SYMPTOMS: "What is the main symptom?" (e.g., redness, swelling, pain)      itching 2. ONSET: "When was the vaccine (shot) given?" "about 1030 amHow much later did the _ begin?" (e.g., hours, days ago)      this afternoon around 1330_ 3. SEVERITY: "How bad is it?"      Real bad but stopped with ice 4. FEVER: "Is there a fever?" If so, ask: "What is it, how was it measured, and when did it start?"      no 5. IMMUNIZATIONS GIVEN: "What shots have you recently received?"     pneumonia 6. PAST REACTIONS: "Have you reacted to immunizations before?" If so, ask: "What happened?"     no 7. OTHER SYMPTOMS: "Do you have any other symptoms?"     No a little sore  Protocols used: IMMUNIZATION REACTIONS-A-AH

## 2018-02-12 NOTE — Assessment & Plan Note (Signed)
Check labs con't meds 

## 2018-02-12 NOTE — ED Provider Notes (Signed)
[facility]PART[facility] Provider Note   CSN: [id number] Arrival date & time: 02/12/18  2303     History   Chief Complaint Chief Complaint  Patient presents with  . Allergic Reaction    HPI Carrie Hall is a 84 y.o. female.  HPI   84 year old female here with arm itching and rash as well as wheezing after receiving her pneumonia vaccine.  Patient states she was seen at her doctor's office and received a pneumonia vaccine earlier this morning.  She notes that shortly after, she began developing mild itching at the site but no swelling.  Throughout the day, she has had progressively worsening itching, redness, and swelling throughout her right arm.  This seems to be spreading.  She been trying to place ice on it with mild improvement in itching, but no improvement in the swelling or spreading of the rash.  She is also notes she has been coughing and has started to wheeze.  She denies any shortness of breath.  Denies any tongue swelling.  No change in her voice.  No nausea, vomiting, or diarrhea.  No history of previous reactions to immunizations in the past.  She was well prior to receiving the shot this morning.  Past Medical History:  Diagnosis Date  . Adenomatous colon polyp   . Breast cancer ([place])   . COPD (chronic obstructive pulmonary disease) ([place])   . Diverticulosis   . Hyperlipidemia   . Internal hemorrhoids   . Osteopenia   . Thyroid disease     Patient Active Problem List   Diagnosis Date Noted  . Essential hypertension 02/12/2018  . Generalized anxiety disorder 01/01/2018  . Mild cognitive impairment 11/10/2017  . Elevated LFTs 10/04/2017  . Loss of weight 10/04/2017  . Loose stools 10/04/2017  . Weight loss, non-intentional 08/07/2017  . Preventative health care 08/07/2017  . Cerumen impaction 08/25/2015  . 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  . Memory loss 08/25/2015  . Diarrhea 12/19/2014  . KNEE PAIN, LEFT 05/27/2010  . DYSURIA  05/27/2010  . POSTMENOPAUSAL STATUS 04/23/2010  . CERVICAL POLYP 10/26/2009  . HEMATURIA, HX OF 10/26/2009  . DIZZINESS 06/11/2009  . FOOT, PAIN 11/17/2008  . OTHER ABNORMAL BLOOD CHEMISTRY 11/17/2008  . TINEA CRURIS 09/15/2008  . UTI 09/15/2008  . COLONIC POLYPS, ADENOMATOUS, HX OF 01/16/2008  . HEMOCCULT POSITIVE STOOL 12/11/2007  . BREAST CANCER, HX OF 12/11/2007  . Hypothyroidism 12/07/2007  . Hyperlipidemia LDL goal <100 12/07/2007  . COPD 12/07/2007  . Disorder of bone and articular cartilage 12/07/2007  . URI 03/13/2007  . HX, URINARY INFECTION 01/09/2007    Past Surgical History:  Procedure Laterality Date  . BREAST LUMPECTOMY Left    radiation only  . BREAST SURGERY    . colon polyps       OB History   None      Home Medications    Prior to Admission medications   Medication Sig Start Date End Date Taking? Authorizing Provider  albuterol (PROVENTIL HFA;VENTOLIN HFA) 108 (90 Base) MCG/ACT inhaler Inhale 2 puffs into the lungs every 4 (four) hours as needed for wheezing or shortness of breath. 02/12/18   Duffy Bruce, MD  alendronate (FOSAMAX) 70 MG tablet Take 1 tablet (70 mg total) by mouth every 7 (seven) days. Take with a full glass of water on an empty stomach. 10/12/17   [name], DO  ALPRAZolam (XANAX) 0.25 MG tablet Take 1 tablet (0.25 mg total) by mouth  at bedtime as needed for anxiety. 09/14/17   [name], DO  atorvastatin (LIPITOR) 10 MG tablet Take 1 tablet (10 mg total) by mouth daily. 08/10/17   [name], DO  donepezil (ARICEPT) 5 MG tablet Take 1 tablet daily 11/01/17   Maxwell Lemen Sprang, MD  folic acid (FOLVITE) 1 MG tablet Take 1 mg by mouth daily.    [provider]  hydrocortisone 2.5 % lotion Apply topically 2 (two) times daily for 10 days. 02/12/18 02/22/18  Duffy Bruce, MD  levothyroxine (SYNTHROID, LEVOTHROID) 100 MCG tablet Take 1 tablet (100 mcg total) by mouth daily. 08/10/17   [name], DO  methotrexate (50 MG/ML) 1 g injection Inject into the vein once a week.    [provider]  predniSONE (DELTASONE) 20 MG tablet Take 2 tablets (40 mg total) by mouth daily for 4 days. 02/12/18 02/16/18  Duffy Bruce, MD  ranitidine (ZANTAC) 150 MG capsule Take 1 capsule (150 mg total) by mouth 2 (two) times daily for 5 days. 02/12/18 02/17/18  Duffy Bruce, MD    Family History Family History  Problem Relation Age of Onset  . Alzheimer's disease Mother   . Cancer Father        lung  . Alzheimer's disease Maternal Grandmother   . Rectal cancer Sister        in her 58s    Social History Social History   Tobacco Use  . Smoking status: Former Smoker    Packs/day: 0.50    Types: Cigarettes    Last attempt to quit: 12/06/2003    Years since quitting: 14.1  . Smokeless tobacco: Never Used  Substance Use Topics  . Alcohol use: No  . Drug use: No     Allergies   Patient has no known allergies.   Review of Systems Review of Systems  Constitutional: Positive for fatigue. Negative for chills and fever.  HENT: Negative for congestion and rhinorrhea.   Eyes: Negative for visual disturbance.  Respiratory: Positive for shortness of breath and wheezing. Negative for cough.   Cardiovascular: Negative for chest pain and leg swelling.  Gastrointestinal: Negative for abdominal pain, diarrhea, nausea and vomiting.  Genitourinary: Negative for dysuria and flank pain.  Musculoskeletal: Negative for neck pain and neck stiffness.  Skin: Positive for rash. Negative for wound.  Allergic/Immunologic: Negative for immunocompromised state.  Neurological: Negative for syncope, weakness and headaches.  All other systems reviewed and are negative.    Physical Exam Updated Vital Signs BP (!) 158/86 (BP Location: Left Arm)   Pulse 87   Temp 98.9 F (37.2 C) (Oral)   Resp 18   Ht 5\' 5"  (1.651 m)   Wt 66.7 kg   SpO2 98%   BMI 24.46 kg/m   Physical Exam    Constitutional: She is oriented to person, place, and time. She appears well-developed and well-nourished. No distress.  HENT:  Head: Normocephalic and atraumatic.  No tongue swelling, OP patent, uvula non-edematous  Eyes: Pupils are equal, round, and reactive to light. Conjunctivae are normal.  Neck: Neck supple.  No stridor  Cardiovascular: Normal rate, regular rhythm and normal heart sounds. Exam reveals no friction rub.  No murmur heard. Pulmonary/Chest: Effort normal. Tachypnea noted. No respiratory distress. She has decreased breath sounds. She has wheezes in the right upper field, the right middle field, the right lower field, the left upper field, the left middle field and the left lower field. She has  no rales.  Speaking in full sentences  Abdominal: Soft. Bowel sounds are normal. She exhibits no distension.  Musculoskeletal: She exhibits no edema.  Neurological: She is alert and oriented to person, place, and time. She exhibits normal muscle tone.  Skin: Skin is warm. Capillary refill takes less than 2 seconds. No rash noted.  Marked edema and erythema to Right upper arm. No induration. No urticaria noted. Injection site w/o induration or fluctuance.  Psychiatric: She has a normal mood and affect.  Nursing note and vitals reviewed.    ED Treatments / Results  Labs (all labs ordered are listed, but only abnormal results are displayed) Labs Reviewed - No data to display  EKG None  Radiology No results found.  Procedures Procedures (including critical care time)  Medications Ordered in ED Medications  famotidine (PEPCID) IVPB 20 mg premix (20 mg Intravenous New Bag/Given 02/12/18 2341)  albuterol (PROVENTIL,VENTOLIN) solution continuous neb (10 mg/hr Nebulization Given 02/12/18 2342)  ipratropium (ATROVENT) nebulizer solution 1 mg (1 mg Nebulization Given 02/12/18 2342)  diphenhydrAMINE (BENADRYL) injection 12.5 mg (12.5 mg Intravenous Given 02/12/18 2337)  predniSONE  (DELTASONE) tablet 60 mg (60 mg Oral Given 02/12/18 2336)     Initial Impression / Assessment and Plan / ED Course  I have reviewed the triage vital signs and the nursing notes.  Pertinent labs & imaging results that were available during my care of the patient were reviewed by me and considered in my medical decision making (see chart for details).     84 yo F here with RUE redness/swelling and cough with wheezing after Pneumovax vaccination this AM. Suspect local allergic reaction, though wheezing is c/f possible developing systemic reaction. No tongue swelling or signs of airway compromise. Given her age, stable vitals, and likely baseline wheezing based on h/o COPD, hesitant to administer epi at this time but will tx aggressively with steroids, nebs, antihistamines. Will plan to monitor in ED x several hours for improvement in sx. Time course is not c/w superimposed cellulitis.  Patient care transferred to Dr. Florina Ou at the end of my shift. Patient presentation, ED course, and plan of care discussed with review of all pertinent labs and imaging. Please see his/her note for further details regarding further ED course and disposition.   Final Clinical Impressions(s) / ED Diagnoses   Final diagnoses:  Allergic reaction, initial encounter  Injection site reaction, initial encounter    ED Discharge Orders         Ordered    predniSONE (DELTASONE) 20 MG tablet  Daily     02/12/18 2342    ranitidine (ZANTAC) 150 MG capsule  2 times daily     02/12/18 2342    hydrocortisone 2.5 % lotion  2 times daily     02/12/18 2342    albuterol (PROVENTIL HFA;VENTOLIN HFA) 108 (90 Base) MCG/ACT inhaler  Every 4 hours PRN     02/12/18 2343           Duffy Bruce, MD 02/12/18 2345

## 2018-02-12 NOTE — ED Triage Notes (Signed)
Pt states she got her vaccine for PNA today and now she have some redness, swollen and itchiness on the arm where she got her vaccine.

## 2018-02-13 ENCOUNTER — Telehealth: Payer: Self-pay

## 2018-02-13 NOTE — Telephone Encounter (Signed)
Author phoned pt. To relay Dr. Nonda Lou message. Pt. States arm is still slightly swollen where injection was given, but no more redness. Pt. States her breathing is fine now since ED visit. No other concerns at this time.

## 2018-02-13 NOTE — ED Notes (Signed)
Pt in NAD, RR even and unlabored. Call bell within reach.

## 2018-02-13 NOTE — ED Notes (Signed)
Ice pack given to place on right upper arm

## 2018-02-13 NOTE — Telephone Encounter (Signed)
Copied from [facility] [id number]. Topic: General - Other >> Feb 13, 2018 11:29 AM Janace Aris A wrote: Reason for CRM: patient called in wanting to let Dr. Carollee Herter know that she did have a reaction to the pneumonia shot and did have to go to the ED, patient says it 's  still  swollen but overall she is feeling a lot better.

## 2018-02-13 NOTE — ED Notes (Signed)
Report given to Yasemia, RN  

## 2018-02-13 NOTE — Telephone Encounter (Signed)
She can use warm compresses/ cold  Take benadryl if needed I have added the vaccine to her allergy list

## 2018-02-13 NOTE — ED Provider Notes (Signed)
02/13/18 1:07 AM  Patient's lungs clear, feeling better.  Local reaction and injection site nearly resolved.  Will discharge patient home with prescriptions.     Rachel Samples, Jenny Reichmann, MD 02/13/18 [phone]

## 2018-03-08 ENCOUNTER — Other Ambulatory Visit: Payer: Self-pay | Admitting: Family Medicine

## 2018-03-29 ENCOUNTER — Other Ambulatory Visit: Payer: Self-pay | Admitting: Family Medicine

## 2018-05-16 ENCOUNTER — Ambulatory Visit: Payer: Self-pay

## 2018-05-16 NOTE — Telephone Encounter (Signed)
Pt takes her alendronate 70 mg once a wk on Tuesday. Pt forgot to take her med yesterday and would like to know if its ok to take today Wednesday. Pt advised to take her medication today then may go back to her Tuesday schedule next week.  Reason for Disposition . General information question, no triage required and triager able to answer question  Answer Assessment - Initial Assessment Questions 1. REASON FOR CALL or QUESTION: "What is your reason for calling today?" or "How can I best help you?" or "What question do you have that I can help answer?"     Pt takes her alendronate 70 mg once a wk on Tuesday. Pt forgot to take her med yesterday and would like to know if its ok to take today Wednesday  Protocols used: INFORMATION ONLY CALL-A-AH

## 2018-06-19 ENCOUNTER — Ambulatory Visit: Payer: Medicare HMO | Admitting: Neurology

## 2018-06-20 DIAGNOSIS — L408 Other psoriasis: Secondary | ICD-10-CM | POA: Diagnosis not present

## 2018-06-20 DIAGNOSIS — Z79899 Other long term (current) drug therapy: Secondary | ICD-10-CM | POA: Diagnosis not present

## 2018-06-22 ENCOUNTER — Encounter: Payer: Self-pay | Admitting: Neurology

## 2018-06-22 ENCOUNTER — Ambulatory Visit: Payer: Medicare HMO | Admitting: Neurology

## 2018-06-22 ENCOUNTER — Other Ambulatory Visit: Payer: Self-pay

## 2018-06-22 VITALS — BP 134/60 | HR 67 | Ht 65.0 in | Wt 145.0 lb

## 2018-06-22 DIAGNOSIS — G3184 Mild cognitive impairment, so stated: Secondary | ICD-10-CM

## 2018-06-22 MED ORDER — DONEPEZIL HCL 10 MG PO TABS: 10.0000 mg | ORAL_TABLET | Freq: Every day | ORAL | 3 refills | Status: DC

## 2018-06-22 NOTE — Patient Instructions (Signed)
1. Increase Donepezil to 10mg  daily. With your current bottle of Donepezil 5mg , take 2 tablets daily. Once done, your new prescription will be for Donepezil 10mg , take 1 tablet daily.  2. Recommend having family checking behind to make sure we are not missing our medications  3. Follow-up in 6 months, call for any changes  FALL PRECAUTIONS: Be cautious when walking. Scan the area for obstacles that may increase the risk of trips and falls. When getting up in the mornings, sit up at the edge of the bed for a few minutes before getting out of bed. Consider elevating the bed at the head end to avoid drop of blood pressure when getting up. Walk always in a well-lit room (use night lights in the walls). Avoid area rugs or power cords from appliances in the middle of the walkways. Use a walker or a cane if necessary and consider physical therapy for balance exercise. Get your eyesight checked regularly.  HOME SAFETY: Consider the safety of the kitchen when operating appliances like stoves, microwave oven, and blender. Consider having supervision and share cooking responsibilities until no longer able to participate in those. Accidents with firearms and other hazards in the house should be identified and addressed as well.  ABILITY TO BE LEFT ALONE: If patient is unable to contact 911 operator, consider using LifeLine, or when the need is there, arrange for someone to stay with patients. Smoking is a fire hazard, consider supervision or cessation. Risk of wandering should be assessed by caregiver and if detected at any point, supervision and safe proof recommendations should be instituted.  MEDICATION SUPERVISION: Inability to self-administer medication needs to be constantly addressed. Implement a mechanism to ensure safe administration of the medications.  RECOMMENDATIONS FOR ALL PATIENTS WITH MEMORY PROBLEMS: 1. Continue to exercise (Recommend 30 minutes of walking everyday, or 3 hours every week) 2.  Increase social interactions - continue going to [place] and enjoy social gatherings with friends and family 3. Eat healthy, avoid fried foods and eat more fruits and vegetables 4. Maintain adequate blood pressure, blood sugar, and blood cholesterol level. Reducing the risk of stroke and cardiovascular disease also helps promoting better memory. 5. Avoid stressful situations. Live a simple life and avoid aggravations. Organize your time and prepare for the next day in anticipation. 6. Sleep well, avoid any interruptions of sleep and avoid any distractions in the bedroom that may interfere with adequate sleep quality 7. Avoid sugar, avoid sweets as there is a strong link between excessive sugar intake, diabetes, and cognitive impairment The Mediterranean diet has been shown to help patients reduce the risk of progressive memory disorders and reduces cardiovascular risk. This includes eating fish, eat fruits and green leafy vegetables, nuts like almonds and hazelnuts, walnuts, and also use olive oil. Avoid fast foods and fried foods as much as possible. Avoid sweets and sugar as sugar use has been linked to worsening of memory function.  There is always a concern of gradual progression of memory problems. If this is the case, then we may need to adjust level of care according to patient needs. Support, both to the patient and caregiver, should then be put into place.

## 2018-06-22 NOTE — Progress Notes (Signed)
NEUROLOGY FOLLOW UP OFFICE NOTE  Carrie Hall [id number] 06-Sep-1933  HISTORY OF PRESENT ILLNESS: I had the pleasure of seeing Carrie Hall in follow-up in the neurology clinic on 06/22/2018.  The patient was last seen 7 months ago for worsening memory. She is again accompanied by her daughter-in-law who helps supplement the history today. MMSE 25/30 in June 2019. She had reported stopping Donepezil prescribed by her PCP and was instructed to restart medication and have family check behind her to ensure compliance. She states that her memory is pretty good, but she does occasionally forget her medications. Her daughter-in-law states she has not seen much difference since last visit. She lives alone. She does not drive. Her son manages finances. She has a cat and does not forget to feed him. She cooks and denies leaving stove on. She denies misplacing things regularly. Family comes to check on her daily. She is independent with dressing and bathing. She denies any mood changes, her daughter-in-law mouths "maybe" when asked about irritability. Sleep is good.   History on Initial Assessment 11/01/2017: This is an 85 year old right-handed woman with a history of hyperlipidemia, hypothyroidism, breast cancer, presenting for evaluation of worsening memory. When asked about her memory, she states she "forgets sometimes." Her daughter-in-law appears hesitant to provide additional information saying she does not want to hurt her feelings, but notes she repeats herself several times the same day. She lives alone, they report she cooks and cleans, no hygiene concerns. Her son has been managing bills for the past 5 years since the patient's husband passed away. She fills her own pillbox and states she forgets a dose once in a while. Her family does not monitor medications so they cannot confirm this. She notes word-finding difficulties. She does not drive. She denies misplacing things frequently or leaving the stove  on. She seems more anxious and has prn Xanax, but does not take this regularly. She took a dose last night and this morning nervous about today's visit. Her maternal grandmother, several cousins, and mother had Alzheimer's dementia. No history of significant head injuries or alcohol intake. She had an MMSE at PCP office in April 2017 which was 21/30 and was started on Donepezil but appears to have stopped it, she does not recall any side effects. Last MMSE at PCP office a few days ago was 25/30. She had an MRI brain without contrast in 08/2015 which I personally reviewed, no acute changes, there was mild to moderate chronic microvascular disease and mild diffuse atrophy.  She denies any headaches, dizziness, vision changes, dysarthria/dysphagia, neck/back pain, focal numbness/tingling/weakness. She has constipation but would get diarrhea with spicy foods.   PAST MEDICAL HISTORY: Past Medical History:  Diagnosis Date  . Adenomatous colon polyp   . Breast cancer ([place])   . COPD (chronic obstructive pulmonary disease) ([place])   . Diverticulosis   . Hyperlipidemia   . Internal hemorrhoids   . Osteopenia   . Thyroid disease     MEDICATIONS: Current Outpatient Medications on File Prior to Visit  Medication Sig Dispense Refill  . albuterol (PROVENTIL HFA;VENTOLIN HFA) 108 (90 Base) MCG/ACT inhaler Inhale 2 puffs into the lungs every 4 (four) hours as needed for wheezing or shortness of breath. 1 Inhaler 0  . alendronate (FOSAMAX) 70 MG tablet Take 1 tablet (70 mg total) by mouth every 7 (seven) days. Take with a full glass of water on an empty stomach. 4 tablet 11  . ALPRAZolam (XANAX) 0.25 MG tablet  Take 1 tablet (0.25 mg total) by mouth at bedtime as needed for anxiety. 30 tablet 0  . atorvastatin (LIPITOR) 10 MG tablet TAKE 1 TABLET BY MOUTH ONCE DAILY 90 tablet 1  . donepezil (ARICEPT) 5 MG tablet Take 1 tablet daily 30 tablet 11  . EPINEPHrine 0.3 mg/0.3 mL IJ SOAJ injection INJECT CONTENTS OF 1  PEN AS NEEDED FOR ALLERGIC REACTION    . folic acid (FOLVITE) 1 MG tablet Take 1 mg by mouth daily.    Marland Kitchen levothyroxine (SYNTHROID, LEVOTHROID) 100 MCG tablet TAKE 1 TABLET BY MOUTH ONCE DAILY 90 tablet 1  . methotrexate (50 MG/ML) 1 g injection Inject into the vein once a week.    . ranitidine (ZANTAC) 150 MG capsule Take 1 capsule (150 mg total) by mouth 2 (two) times daily for 5 days. 10 capsule 0   No current facility-administered medications on file prior to visit.     ALLERGIES: Allergies  Allergen Reactions  . Pneumococcal Vaccines     FAMILY HISTORY: Family History  Problem Relation Age of Onset  . Alzheimer's disease Mother   . Cancer Father        lung  . Alzheimer's disease Maternal Grandmother   . Rectal cancer Sister        in her 48s    SOCIAL HISTORY: Social History   Socioeconomic History  . Marital status: Widowed    Spouse name: Not on file  . Number of children: Not on file  . Years of education: Not on file  . Highest education level: Not on file  Occupational History  . Not on file  Social Needs  . Financial resource strain: Not on file  . Food insecurity:    Worry: Not on file    Inability: Not on file  . Transportation needs:    Medical: Not on file    Non-medical: Not on file  Tobacco Use  . Smoking status: Former Smoker    Packs/day: 0.50    Types: Cigarettes    Last attempt to quit: 12/06/2003    Years since quitting: 14.5  . Smokeless tobacco: Never Used  Substance and Sexual Activity  . Alcohol use: No  . Drug use: No  . Sexual activity: Never    Partners: Male  Lifestyle  . Physical activity:    Days per week: Not on file    Minutes per session: Not on file  . Stress: Not on file  Relationships  . Social connections:    Talks on phone: Not on file    Gets together: Not on file    Attends religious service: Not on file    Active member of club or organization: Not on file    Attends meetings of clubs or organizations: Not  on file    Relationship status: Not on file  . Intimate partner violence:    Fear of current or ex partner: Not on file    Emotionally abused: Not on file    Physically abused: Not on file    Forced sexual activity: Not on file  Other Topics Concern  . 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    REVIEW OF SYSTEMS: Constitutional: No fevers, chills, or sweats, no generalized fatigue, change in appetite Eyes: No visual changes, double vision, eye pain  Ear, nose and throat: No hearing loss, ear pain, nasal congestion, sore throat Cardiovascular: No chest pain, palpitations Respiratory:  No shortness of breath at rest or with exertion, wheezes GastrointestinaI: No nausea, vomiting, diarrhea, abdominal pain, fecal incontinence Genitourinary:  No dysuria, urinary retention or frequency Musculoskeletal:  No neck pain, back pain, +left knee pain Integumentary: No rash, pruritus, skin lesions Neurological: as above Psychiatric: No depression, insomnia, anxiety Endocrine: No palpitations, fatigue, diaphoresis, mood swings, change in appetite, change in weight, increased thirst Hematologic/Lymphatic:  No anemia, purpura, petechiae. Allergic/Immunologic: no itchy/runny eyes, nasal congestion, recent allergic reactions, rashes  PHYSICAL EXAM: Vitals:   06/22/18 1009  BP: 134/60  Pulse: 67  SpO2: 94%   General: No acute distress Head:  Normocephalic/atraumatic Neck: supple, no paraspinal tenderness, full range of motion Heart:  Regular rate and rhythm Lungs:  Clear to auscultation bilaterally Back: No paraspinal tenderness Skin/Extremities: No rash, no edema Neurological Exam: alert and oriented to person, place, and time. No aphasia or dysarthria. Fund of knowledge is appropriate.  Recent and remote memory are intact.  Attention and concentration are normal.     Able to name objects and repeat phrases.  Montreal Cognitive Assessment  06/22/2018  Visuospatial/ Executive (0/5) 3  Naming (0/3) 3  Attention: Read list of digits (0/2) 1  Attention: Read list of letters (0/1) 0  Attention: Serial 7 subtraction starting at 100 (0/3) 0  Language: Repeat phrase (0/2) 0  Language : Fluency (0/1) 0  Abstraction (0/2) 0  Delayed Recall (0/5) 0  Orientation (0/6) 6  Total 13  Adjusted Score (based on education) 14   MMSE - Mini Mental State Exam 11/01/2017 10/27/2017  Orientation to time 5 4  Orientation to Place 4 5  Registration 3 3  Attention/ Calculation 3 3  Recall 1 1  Language- name 2 objects 2 2  Language- repeat 1 1  Language- follow 3 step command 3 3  Language- read & follow direction 1 1  Write a sentence 1 1  Copy design 1 1  Total score 25 25   Cranial nerves: Pupils equal, round, reactive to light.  Extraocular movements intact with no nystagmus. Visual fields full. Facial sensation intact. No facial asymmetry. Tongue, uvula, palate midline.  Motor: Bulk and tone normal, muscle strength 5/5 throughout with no pronator drift.  Sensation to light touch intact.  No extinction to double simultaneous stimulation. Finger to nose testing intact.  Gait narrow-based, slightly favors left knee due to pain, no ataxia.  IMPRESSION: This is an 85 yo RH woman with a history of hyperlipidemia, hypothyroidism, breast cancer, with worsening memory. MOCA score today 14/30 (MMSE 25/30 in June 2019), this is in the range of mild dementia, however symptoms suggestive of Mild Cognitive Impairment. Her daughter-in-law seems to be unable to speak frankly in front of the patient so we may not be getting full history. Increase Donepezil to 10mg  daily. She was instructed to have family start checking behind her with the medications. We discussed advanced care planning, she feels she would like to move to an ALF if she starts needing more help. We again discussed the  importance of control of vascular risk factors, physical exercise, and brain stimulation exercises for brain health. She will follow-up in 6 months and knows to call for any changes.  Thank you for allowing me to participate in her care.  Please do not hesitate to call for any questions or concerns.  The duration of this appointment visit was  30 minutes of face-to-face time with the patient.  Greater than 50% of this time was spent in counseling, explanation of diagnosis, planning of further management, and coordination of care.   Ellouise Newer, M.D.   CC: Dr. Cheri Rous

## 2018-07-30 ENCOUNTER — Other Ambulatory Visit: Payer: Self-pay | Admitting: Family Medicine

## 2018-07-30 DIAGNOSIS — F411 Generalized anxiety disorder: Secondary | ICD-10-CM

## 2018-07-30 MED ORDER — ALPRAZOLAM 0.25 MG PO TABS: 0.2500 mg | ORAL_TABLET | Freq: Every evening | ORAL | 0 refills | Status: DC | PRN

## 2018-07-30 NOTE — Telephone Encounter (Signed)
Pt is requesting refill on alprazolam.   Last OV: 02/12/2018 Last Fill: 09/14/2017 #30 and 0RF UDS: 01/01/2018 Low risk

## 2018-08-17 ENCOUNTER — Encounter: Payer: Medicare HMO | Admitting: Family Medicine

## 2018-09-03 ENCOUNTER — Other Ambulatory Visit: Payer: Self-pay | Admitting: Family Medicine

## 2018-09-08 ENCOUNTER — Other Ambulatory Visit: Payer: Self-pay | Admitting: Family Medicine

## 2018-09-27 ENCOUNTER — Other Ambulatory Visit: Payer: Self-pay | Admitting: Family Medicine

## 2018-09-27 NOTE — Telephone Encounter (Signed)
Copied from [place] [id number]. Topic: Quick Communication - Rx Refill/Question >> Sep 27, 2018  3:42 PM Blase Mess A wrote: Medication: levothyroxine (SYNTHROID) 100 MCG tablet [[id number]]   Has the patient contacted their pharmacy? Yes  (Agent: If no, request that the patient contact the pharmacy for the refill.) (Agent: If yes, when and what did the pharmacy advise?)  Preferred Pharmacy (with phone number or street name): [facility], [place] [phone] (Phone) [phone] (Fax)     Agent: Please be advised that RX refills may take up to 3 business days. We ask that you follow-up with your pharmacy.

## 2018-09-28 MED ORDER — LEVOTHYROXINE SODIUM 100 MCG PO TABS: 100.0000 ug | ORAL_TABLET | Freq: Every day | ORAL | 1 refills | Status: DC

## 2018-09-28 NOTE — Telephone Encounter (Signed)
Refill sent to pharmacy.   

## 2018-10-26 NOTE — Progress Notes (Signed)
Virtual Visit via Video Note  I connected with patient on 10/29/18 at  8:00 AM EDT by audio enabled telemedicine application and verified that I am speaking with the correct person using two identifiers.   THIS ENCOUNTER IS A VIRTUAL VISIT DUE TO COVID-19 - PATIENT WAS NOT SEEN IN THE OFFICE. PATIENT HAS CONSENTED TO VIRTUAL VISIT / TELEMEDICINE VISIT   Location of patient: home  Location of provider: office  I discussed the limitations of evaluation and management by telemedicine and the availability of in person appointments. The patient expressed understanding and agreed to proceed.   Subjective:   Carrie Hall is a 85 y.o. female who presents for Medicare Annual (Subsequent) preventive examination.  Review of Systems: No ROS.  Medicare Wellness Virtual Visit.  Visual/audio telehealth visit, UTA vital signs.   See social history for additional risk factors.    Sleep patterns: no issues Home Safety/Smoke Alarms: Feels safe in home. Smoke alarms in place. Pt is being seen by Dr.Aquino for memory impairment. Lives alone. Does not drive. Son manages finances. Family checks on her daily. Youngest son lives across the street and eats with her every evening.    Female:        Mammo-4//19. Pt states she will call to schedule.       Dexa scan- 10/06/17           Objective:     Advanced Directives 10/29/2018 02/12/2018 10/27/2017 07/15/2016  Does Patient Have a Medical Advance Directive? No No No No  Would patient like information on creating a medical advance directive? No - Patient declined - Yes (MAU/Ambulatory/Procedural Areas - Information given) No - Patient declined    Tobacco Social History   Tobacco Use  Smoking Status Former Smoker  . Packs/day: 0.50  . Types: Cigarettes  . Quit date: 12/06/2003  . Years since quitting: 14.9  Smokeless Tobacco Never Used     Counseling given: Not Answered   Clinical Intake: Pain : No/denies pain   Past Medical History:   Diagnosis Date  . Adenomatous colon polyp   . Breast cancer ([place])   . COPD (chronic obstructive pulmonary disease) ([place])   . Diverticulosis   . Hyperlipidemia   . Internal hemorrhoids   . Osteopenia   . Thyroid disease    Past Surgical History:  Procedure Laterality Date  . BREAST LUMPECTOMY Left    radiation only  . BREAST SURGERY    . colon polyps     Family History  Problem Relation Age of Onset  . Alzheimer's disease Mother   . Cancer Father        lung  . Alzheimer's disease Maternal Grandmother   . Rectal cancer Sister        in her 65s   Social History   Socioeconomic History  . Marital status: Widowed    Spouse name: Not on file  . Number of children: Not on file  . Years of education: Not on file  . Highest education level: Not on file  Occupational History  . Not on file  Social Needs  . Financial resource strain: Not on file  . Food insecurity    Worry: Not on file    Inability: Not on file  . Transportation needs    Medical: Not on file    Non-medical: Not on file  Tobacco Use  . Smoking status: Former Smoker    Packs/day: 0.50    Types: Cigarettes    Quit date: 12/06/2003  Years since quitting: 14.9  . Smokeless tobacco: Never Used  Substance and Sexual Activity  . Alcohol use: No  . Drug use: No  . Sexual activity: Never    Partners: Male  Lifestyle  . Physical activity    Days per week: Not on file    Minutes per session: Not on file  . Stress: Not on file  Relationships  . Social Herbalist on phone: Not on file    Gets together: Not on file    Attends religious service: Not on file    Active member of club or organization: Not on file    Attends meetings of clubs or organizations: Not on file    Relationship status: Not on file  Other Topics Concern  . 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 alendronate (FOSAMAX) 70 MG tablet Take 1 tablet (70 mg total) by mouth every 7 (seven) days. Take with a full glass of water on an empty stomach.  Marland Kitchen atorvastatin (LIPITOR) 10 MG tablet Take 1 tablet by mouth once daily  . donepezil (ARICEPT) 10 MG tablet Take 1 tablet (10 mg total) by mouth at bedtime.  Marland Kitchen levothyroxine (SYNTHROID) 100 MCG tablet Take 1 tablet (100 mcg total) by mouth daily.  Marland Kitchen albuterol (PROVENTIL HFA;VENTOLIN HFA) 108 (90 Base) MCG/ACT inhaler Inhale 2 puffs into the lungs every 4 (four) hours as needed for wheezing or shortness of breath. (Patient not taking: Reported on 10/29/2018)  . ALPRAZolam (XANAX) 0.25 MG tablet Take 1 tablet (0.25 mg total) by mouth at bedtime as needed for anxiety. (Patient not taking: Reported on 10/29/2018)  . EPINEPHrine 0.3 mg/0.3 mL IJ SOAJ injection INJECT CONTENTS OF 1 PEN AS NEEDED FOR ALLERGIC REACTION  . folic acid (FOLVITE) 1 MG tablet Take 1 mg by mouth daily.  . methotrexate (50 MG/ML) 1 g injection Inject into the vein once a week.  . ranitidine (ZANTAC) 150 MG capsule Take 1 capsule (150 mg total) by mouth 2 (two) times daily for 5 days.   No facility-administered encounter medications on file as of 10/29/2018.     Activities of Daily Living In your present state of health, do you have any difficulty performing the following activities: 10/29/2018  Hearing? N  Vision? N  Comment wears reading glasses.  Difficulty concentrating or making decisions? N  Walking or climbing stairs? N  Dressing or bathing? N  Doing errands, shopping? N  Preparing Food and eating ? N  Using the Toilet? N  In the past six months, have you accidently leaked urine? N  Do you have problems with loss of bowel control? N  Some recent data might be hidden    Patient Care Team: Carollee Herter, Alferd Apa, DO as PCP - General    Assessment:   This is a routine wellness examination  for Carrie Hall. Physical assessment deferred to PCP.  Exercise Activities and Dietary recommendations   Diet (meal preparation, eat out, water intake, caffeinated beverages, dairy products, fruits and vegetables): well balanced, on average, 3 meals per day   Goals    . maintain independence and current health (pt-stated)       Fall Risk Fall Risk  10/29/2018 06/22/2018 11/01/2017 10/27/2017  08/07/2017  Falls in the past year? 0 0 No No No  Number falls in past yr: - 0 - - -  Injury with Fall? - 0 - - -    Depression Screen PHQ 2/9 Scores 10/29/2018 10/27/2017 08/07/2017 01/28/2016  PHQ - 2 Score 0 0 0 0     Cognitive Function Ad8 score reviewed for issues:  Issues making decisions:no  Less interest in hobbies / activities:no  Repeats questions, stories (family complaining):no  Trouble using ordinary gadgets (microwave, computer, phone):no  Forgets the month or year: no  Mismanaging finances: no  Remembering appts:no  Daily problems with thinking and/or memory:no Ad8 score is=0   MMSE - Mini Mental State Exam 11/01/2017 10/27/2017  Orientation to time 5 4  Orientation to Place 4 5  Registration 3 3  Attention/ Calculation 3 3  Recall 1 1  Language- name 2 objects 2 2  Language- repeat 1 1  Language- follow 3 step command 3 3  Language- read & follow direction 1 1  Write a sentence 1 1  Copy design 1 1  Total score 25 25   Montreal Cognitive Assessment  06/22/2018  Visuospatial/ Executive (0/5) 3  Naming (0/3) 3  Attention: Read list of digits (0/2) 1  Attention: Read list of letters (0/1) 0  Attention: Serial 7 subtraction starting at 100 (0/3) 0  Language: Repeat phrase (0/2) 0  Language : Fluency (0/1) 0  Abstraction (0/2) 0  Delayed Recall (0/5) 0  Orientation (0/6) 6  Total 13  Adjusted Score (based on education) 14      Immunization History  Administered Date(s) Administered  . Influenza Split 03/15/2011  . Influenza Whole 03/21/2007, 02/04/2008,  02/25/2009, 03/11/2010, 02/07/2012  . Influenza, High Dose Seasonal PF 03/07/2014, 01/28/2016, 02/22/2017, 01/13/2018  . Influenza,inj,Quad PF,6+ Mos 02/14/2013, 02/26/2015  . Pneumococcal Conjugate-13 03/07/2014  . Pneumococcal Polysaccharide-23 05/15/2006, 12/11/2007, 02/12/2018  . Td 02/24/2004  . Tdap 08/07/2017  . Zoster 11/02/2009   Screening Tests Health Maintenance  Topic Date Due  . MAMMOGRAM  03/13/2018  . INFLUENZA VACCINE  12/08/2018  . TETANUS/TDAP  08/08/2027  . DEXA SCAN  Completed  . PNA vac Low Risk Adult  Completed      Plan:   See you next year!  Continue to eat heart healthy diet (full of fruits, vegetables, whole grains, lean protein, water--limit salt, fat, and sugar intake) and increase physical activity as tolerated.  Continue doing brain stimulating activities (puzzles, reading, adult coloring books, staying active) to keep memory sharp.     I have personally reviewed and noted the following in the patient's chart:   . Medical and social history . Use of alcohol, tobacco or illicit drugs  . Current medications and supplements . Functional ability and status . Nutritional status . Physical activity . Advanced directives . List of other physicians . Hospitalizations, surgeries, and ER visits in previous 12 months . Vitals . Screenings to include cognitive, depression, and falls . Referrals and appointments  In addition, I have reviewed and discussed with patient certain preventive protocols, quality metrics, and best practice recommendations. A written personalized care plan for preventive services as well as general preventive health recommendations were provided to patient.     Naaman Plummer [place], [place]  10/29/2018

## 2018-10-29 ENCOUNTER — Encounter: Payer: Self-pay | Admitting: *Deleted

## 2018-10-29 ENCOUNTER — Ambulatory Visit (INDEPENDENT_AMBULATORY_CARE_PROVIDER_SITE_OTHER): Payer: Medicare HMO | Admitting: Family Medicine

## 2018-10-29 ENCOUNTER — Ambulatory Visit (INDEPENDENT_AMBULATORY_CARE_PROVIDER_SITE_OTHER): Payer: Medicare HMO | Admitting: *Deleted

## 2018-10-29 ENCOUNTER — Encounter

## 2018-10-29 ENCOUNTER — Encounter: Payer: Self-pay | Admitting: Family Medicine

## 2018-10-29 ENCOUNTER — Other Ambulatory Visit: Payer: Self-pay

## 2018-10-29 VITALS — Ht 65.0 in | Wt 142.0 lb

## 2018-10-29 DIAGNOSIS — E039 Hypothyroidism, unspecified: Secondary | ICD-10-CM | POA: Diagnosis not present

## 2018-10-29 DIAGNOSIS — E785 Hyperlipidemia, unspecified: Secondary | ICD-10-CM | POA: Diagnosis not present

## 2018-10-29 DIAGNOSIS — Z0000 Encounter for general adult medical examination without abnormal findings: Secondary | ICD-10-CM

## 2018-10-29 DIAGNOSIS — R300 Dysuria: Secondary | ICD-10-CM | POA: Diagnosis not present

## 2018-10-29 NOTE — Progress Notes (Signed)
Reviewed  Yvonne R Lowne Chase, DO  

## 2018-10-29 NOTE — Progress Notes (Signed)
Virtual Visit via Video Note  I connected with Carrie Hall on 10/29/18 at  1:00 PM EDT by a video enabled telemedicine application and verified that I am speaking with the correct person using two identifiers.  Location: Patient: home  Provider: office   I discussed the limitations of evaluation and management by telemedicine and the availability of in person appointments. The patient expressed understanding and agreed to proceed.  History of Present Illness: Pt is home and needs f/u and labs  She also c/o dysuria today but no fevers, no abd pain or flank pain   Past Medical History:  Diagnosis Date  . Adenomatous colon polyp   . Breast cancer ([place])   . COPD (chronic obstructive pulmonary disease) ([place])   . Diverticulosis   . Hyperlipidemia   . Internal hemorrhoids   . Osteopenia   . Thyroid disease    Current Outpatient Medications on File Prior to Visit  Medication Sig Dispense Refill  . alendronate (FOSAMAX) 70 MG tablet Take 1 tablet (70 mg total) by mouth every 7 (seven) days. Take with a full glass of water on an empty stomach. 4 tablet 11  . atorvastatin (LIPITOR) 10 MG tablet Take 1 tablet by mouth once daily 90 tablet 0  . donepezil (ARICEPT) 10 MG tablet Take 1 tablet (10 mg total) by mouth at bedtime. 90 tablet 3  . EPINEPHrine 0.3 mg/0.3 mL IJ SOAJ injection INJECT CONTENTS OF 1 PEN AS NEEDED FOR ALLERGIC REACTION    . folic acid (FOLVITE) 1 MG tablet Take 1 mg by mouth daily.    Marland Kitchen levothyroxine (SYNTHROID) 100 MCG tablet Take 1 tablet (100 mcg total) by mouth daily. 90 tablet 1  . methotrexate (50 MG/ML) 1 g injection Inject into the vein once a week.    Marland Kitchen albuterol (PROVENTIL HFA;VENTOLIN HFA) 108 (90 Base) MCG/ACT inhaler Inhale 2 puffs into the lungs every 4 (four) hours as needed for wheezing or shortness of breath. (Patient not taking: Reported on 10/29/2018) 1 Inhaler 0  . ALPRAZolam (XANAX) 0.25 MG tablet Take 1 tablet (0.25 mg total) by mouth at bedtime as  needed for anxiety. (Patient not taking: Reported on 10/29/2018) 30 tablet 0  . ranitidine (ZANTAC) 150 MG capsule Take 1 capsule (150 mg total) by mouth 2 (two) times daily for 5 days. 10 capsule 0   No current facility-administered medications on file prior to visit.      Observations/Objective: Unable to get vitals Pt in NAD   Assessment and Plan: 1. Hypothyroidism, unspecified type con't meds  Check labs  - TSH; Future  2. Hyperlipidemia, unspecified hyperlipidemia type Encouraged heart healthy diet, increase exercise, avoid trans fats, consider a krill oil cap daily - Lipid panel; Future - Comprehensive metabolic panel; Future  3. Dysuria Check urine  - POCT Urinalysis Dipstick (Automated)   Follow Up Instructions:    I discussed the assessment and treatment plan with the patient. The patient was provided an opportunity to ask questions and all were answered. The patient agreed with the plan and demonstrated an understanding of the instructions.   The patient was advised to call back or seek an in-person evaluation if the symptoms worsen or if the condition fails to improve as anticipated.  I provided 25 minutes of non-face-to-face time during this encounter.   [name], DO

## 2018-10-29 NOTE — Patient Instructions (Signed)
See you next year!  Continue to eat heart healthy diet (full of fruits, vegetables, whole grains, lean protein, water--limit salt, fat, and sugar intake) and increase physical activity as tolerated.  Continue doing brain stimulating activities (puzzles, reading, adult coloring books, staying active) to keep memory sharp.    Carrie Hall , Thank you for taking time to come for your Medicare Wellness Visit. I appreciate your ongoing commitment to your health goals. Please review the following plan we discussed and let me know if I can assist you in the future.   These are the goals we discussed: Goals    . maintain independence and current health (pt-stated)       This is a list of the screening recommended for you and due dates:  Health Maintenance  Topic Date Due  . Mammogram  03/13/2018  . Flu Shot  12/08/2018  . Tetanus Vaccine  08/08/2027  . DEXA scan (bone density measurement)  Completed  . Pneumonia vaccines  Completed    Health Maintenance After Age 72 After age 82, you are at a higher risk for certain long-term diseases and infections as well as injuries from falls. Falls are a major cause of broken bones and head injuries in people who are older than age 38. Getting regular preventive care can help to keep you healthy and well. Preventive care includes getting regular testing and making lifestyle changes as recommended by your health care provider. Talk with your health care provider about:  Which screenings and tests you should have. A screening is a test that checks for a disease when you have no symptoms.  A diet and exercise plan that is right for you. What should I know about screenings and tests to prevent falls? Screening and testing are the best ways to find a health problem early. Early diagnosis and treatment give you the best chance of managing medical conditions that are common after age 96. Certain conditions and lifestyle choices may make you more likely to have a  fall. Your health care provider may recommend:  Regular vision checks. Poor vision and conditions such as cataracts can make you more likely to have a fall. If you wear glasses, make sure to get your prescription updated if your vision changes.  Medicine review. Work with your health care provider to regularly review all of the medicines you are taking, including over-the-counter medicines. Ask your health care provider about any side effects that may make you more likely to have a fall. Tell your health care provider if any medicines that you take make you feel dizzy or sleepy.  Osteoporosis screening. Osteoporosis is a condition that causes the bones to get weaker. This can make the bones weak and cause them to break more easily.  Blood pressure screening. Blood pressure changes and medicines to control blood pressure can make you feel dizzy.  Strength and balance checks. Your health care provider may recommend certain tests to check your strength and balance while standing, walking, or changing positions.  Foot health exam. Foot pain and numbness, as well as not wearing proper footwear, can make you more likely to have a fall.  Depression screening. You may be more likely to have a fall if you have a fear of falling, feel emotionally low, or feel unable to do activities that you used to do.  Alcohol use screening. Using too much alcohol can affect your balance and may make you more likely to have a fall. What actions can I take  to lower my risk of falls? General instructions  Talk with your health care provider about your risks for falling. Tell your health care provider if: ? You fall. Be sure to tell your health care provider about all falls, even ones that seem minor. ? You feel dizzy, sleepy, or off-balance.  Take over-the-counter and prescription medicines only as told by your health care provider. These include any supplements.  Eat a healthy diet and maintain a healthy weight. A  healthy diet includes low-fat dairy products, low-fat (lean) meats, and fiber from whole grains, beans, and lots of fruits and vegetables. Home safety  Remove any tripping hazards, such as rugs, cords, and clutter.  Install safety equipment such as grab bars in bathrooms and safety rails on stairs.  Keep rooms and walkways well-lit. Activity   Follow a regular exercise program to stay fit. This will help you maintain your balance. Ask your health care provider what types of exercise are appropriate for you.  If you need a cane or walker, use it as recommended by your health care provider.  Wear supportive shoes that have nonskid soles. Lifestyle  Do not drink alcohol if your health care provider tells you not to drink.  If you drink alcohol, limit how much you have: ? 0-1 drink a day for women. ? 0-2 drinks a day for men.  Be aware of how much alcohol is in your drink. In the [place] one drink equals one typical bottle of beer (12 oz), one-half glass of wine (5 oz), or one shot of hard liquor (1 oz).  Do not use any products that contain nicotine or tobacco, such as cigarettes and e-cigarettes. If you need help quitting, ask your health care provider. Summary  Having a healthy lifestyle and getting preventive care can help to protect your health and wellness after age 12.  Screening and testing are the best way to find a health problem early and help you avoid having a fall. Early diagnosis and treatment give you the best chance for managing medical conditions that are more common for people who are older than age 9.  Falls are a major cause of broken bones and head injuries in people who are older than age 78. Take precautions to prevent a fall at home.  Work with your health care provider to learn what changes you can make to improve your health and wellness and to prevent falls. This information is not intended to replace advice given to you by your health care provider. Make  sure you discuss any questions you have with your health care provider. Document Released: 03/08/2017 Document Revised: 03/08/2017 Document Reviewed: 03/08/2017 Elsevier Interactive Patient Education  2019 [employer].

## 2018-11-01 ENCOUNTER — Other Ambulatory Visit (INDEPENDENT_AMBULATORY_CARE_PROVIDER_SITE_OTHER): Payer: Medicare HMO

## 2018-11-01 ENCOUNTER — Other Ambulatory Visit: Payer: Self-pay

## 2018-11-01 DIAGNOSIS — R300 Dysuria: Secondary | ICD-10-CM | POA: Diagnosis not present

## 2018-11-01 DIAGNOSIS — E785 Hyperlipidemia, unspecified: Secondary | ICD-10-CM | POA: Diagnosis not present

## 2018-11-01 DIAGNOSIS — E039 Hypothyroidism, unspecified: Secondary | ICD-10-CM

## 2018-11-01 LAB — POC URINALSYSI DIPSTICK (AUTOMATED)
Bilirubin, UA: NEGATIVE
Blood, UA: NEGATIVE
Glucose, UA: NEGATIVE
Ketones, UA: NEGATIVE
Leukocytes, UA: NEGATIVE
Nitrite, UA: NEGATIVE
Protein, UA: NEGATIVE
Spec Grav, UA: 1.015 (ref 1.010–1.025)
Urobilinogen, UA: 0.2 E.U./dL
pH, UA: 7 (ref 5.0–8.0)

## 2018-11-01 LAB — LIPID PANEL
Cholesterol: 183 mg/dL (ref 0–200)
HDL: 55.4 mg/dL (ref >39.00)
LDL Cholesterol: 105 mg/dL — ABNORMAL HIGH (ref 0–99)
NonHDL: 127.76
Total CHOL/HDL Ratio: 3
Triglycerides: 113 mg/dL (ref 0.0–149.0)
VLDL: 22.6 mg/dL (ref 0.0–40.0)

## 2018-11-01 LAB — COMPREHENSIVE METABOLIC PANEL
ALT: 9 U/L (ref 0–35)
AST: 13 U/L (ref 0–37)
Albumin: 4 g/dL (ref 3.5–5.2)
Alkaline Phosphatase: 68 U/L (ref 39–117)
BUN: 12 mg/dL (ref 6–23)
CO2: 34 mEq/L — ABNORMAL HIGH (ref 19–32)
Calcium: 8.9 mg/dL (ref 8.4–10.5)
Chloride: 102 mEq/L (ref 96–112)
Creatinine, Ser: 0.62 mg/dL (ref 0.40–1.20)
GFR: 91.96 mL/min (ref >60.00)
Glucose, Bld: 91 mg/dL (ref 70–99)
Potassium: 4.5 mEq/L (ref 3.5–5.1)
Sodium: 142 mEq/L (ref 135–145)
Total Bilirubin: 0.5 mg/dL (ref 0.2–1.2)
Total Protein: 6.3 g/dL (ref 6.0–8.3)

## 2018-11-01 LAB — TSH: TSH: 0.25 u[IU]/mL — ABNORMAL LOW (ref 0.35–4.50)

## 2018-11-02 LAB — URINE CULTURE
MICRO NUMBER:: 607286
SPECIMEN QUALITY:: ADEQUATE

## 2018-11-06 ENCOUNTER — Other Ambulatory Visit: Payer: Self-pay

## 2018-11-06 ENCOUNTER — Other Ambulatory Visit: Payer: Self-pay | Admitting: Family Medicine

## 2018-11-06 DIAGNOSIS — E039 Hypothyroidism, unspecified: Secondary | ICD-10-CM

## 2018-11-06 MED ORDER — LEVOTHYROXINE SODIUM 88 MCG PO TABS: 88.0000 ug | ORAL_TABLET | Freq: Every day | ORAL | 1 refills | Status: DC

## 2018-11-07 ENCOUNTER — Telehealth: Payer: Self-pay

## 2018-11-07 NOTE — Telephone Encounter (Signed)
Copied from [place] [id number]. Topic: General - Other >> Nov 07, 2018 12:46 PM Mcneil, Ja-Kwan wrote: Reason for CRM: Carrie Hall the pharmacist with [facility] called to verify the dosage change for the Rx for levothyroxine (SYNTHROID) 88 MCG tablet. Tyler requests call back at [phone]

## 2018-11-07 NOTE — Telephone Encounter (Signed)
Spoke w/ Dorothea Ogle, verbalized correct dosage of levothyroxine is 58mcg as of 11/01/2018. Dorothea Ogle verbalized understanding.

## 2018-11-08 ENCOUNTER — Other Ambulatory Visit: Payer: Self-pay | Admitting: Family Medicine

## 2018-11-08 MED ORDER — ALENDRONATE SODIUM 70 MG PO TABS: 70.0000 mg | ORAL_TABLET | ORAL | 11 refills | Status: DC

## 2018-11-08 NOTE — Telephone Encounter (Signed)
Rx sent 

## 2018-11-08 NOTE — Telephone Encounter (Signed)
[facility] alendronate (FOSAMAX) [address] [facility] [address] [place], [place] - [address] [employer] [phone] (Phone) [phone] (Fax)

## 2018-12-05 ENCOUNTER — Encounter: Payer: Self-pay | Admitting: Medical

## 2018-12-05 ENCOUNTER — Other Ambulatory Visit: Payer: Self-pay

## 2018-12-05 ENCOUNTER — Ambulatory Visit (INDEPENDENT_AMBULATORY_CARE_PROVIDER_SITE_OTHER): Payer: Medicare HMO | Admitting: Medical

## 2018-12-05 VITALS — BP 150/53 | HR 77 | Temp 98.2°F | Resp 16 | Ht 65.0 in | Wt 144.4 lb

## 2018-12-05 DIAGNOSIS — M5431 Sciatica, right side: Secondary | ICD-10-CM | POA: Diagnosis not present

## 2018-12-05 NOTE — Patient Instructions (Signed)
You do appear to have very mild transient intermittent localized sciatica pain. I want you to try low dose 200 mg ibuprofen over the counter 1 tab every 8 hours only for 2 days.   Can try stretching exercises as tolerated if you feel comfortable doing.  Also can try over the counter  lidocaine patch  If pain persists or worsens by next week would recommend xray l spine.  Follow up 10-14 days telephone virtual visit.   Back Exercises These exercises help to make your trunk and back strong. They also help to keep the lower back flexible. Doing these exercises can help to prevent back pain or lessen existing pain.  If you have back pain, try to do these exercises 2-3 times each day or as told by your doctor.  As you get better, do the exercises once each day. Repeat the exercises more often as told by your doctor.  To stop back pain from coming back, do the exercises once each day, or as told by your doctor. Exercises Single knee to chest Do these steps 3-5 times in a row for each leg: 1. Lie on your back on a firm bed or the floor with your legs stretched out. 2. Bring one knee to your chest. 3. Grab your knee or thigh with both hands and hold them it in place. 4. Pull on your knee until you feel a gentle stretch in your lower back or buttocks. 5. Keep doing the stretch for 10-30 seconds. 6. Slowly let go of your leg and straighten it. Pelvic tilt Do these steps 5-10 times in a row: 1. Lie on your back on a firm bed or the floor with your legs stretched out. 2. Bend your knees so they point up to the ceiling. Your feet should be flat on the floor. 3. Tighten your lower belly (abdomen) muscles to press your lower back against the floor. This will make your tailbone point up to the ceiling instead of pointing down to your feet or the floor. 4. Stay in this position for 5-10 seconds while you gently tighten your muscles and breathe evenly. Cat-cow Do these steps until your lower back  bends more easily: 1. Get on your hands and knees on a firm surface. Keep your hands under your shoulders, and keep your knees under your hips. You may put padding under your knees. 2. Let your head hang down toward your chest. Tighten (contract) the muscles in your belly. Point your tailbone toward the floor so your lower back becomes rounded like the back of a cat. 3. Stay in this position for 5 seconds. 4. Slowly lift your head. Let the muscles of your belly relax. Point your tailbone up toward the ceiling so your back forms a sagging arch like the back of a cow. 5. Stay in this position for 5 seconds.  Press-ups Do these steps 5-10 times in a row: 1. Lie on your belly (face-down) on the floor. 2. Place your hands near your head, about shoulder-width apart. 3. While you keep your back relaxed and keep your hips on the floor, slowly straighten your arms to raise the top half of your body and lift your shoulders. Do not use your back muscles. You may change where you place your hands in order to make yourself more comfortable. 4. Stay in this position for 5 seconds. 5. Slowly return to lying flat on the floor.  Bridges Do these steps 10 times in a row: 1. Lie on  your back on a firm surface. 2. Bend your knees so they point up to the ceiling. Your feet should be flat on the floor. Your arms should be flat at your sides, next to your body. 3. Tighten your butt muscles and lift your butt off the floor until your waist is almost as high as your knees. If you do not feel the muscles working in your butt and the back of your thighs, slide your feet 1-2 inches farther away from your butt. 4. Stay in this position for 3-5 seconds. 5. Slowly lower your butt to the floor, and let your butt muscles relax. If this exercise is too easy, try doing it with your arms crossed over your chest. Belly crunches Do these steps 5-10 times in a row: 1. Lie on your back on a firm bed or the floor with your legs  stretched out. 2. Bend your knees so they point up to the ceiling. Your feet should be flat on the floor. 3. Cross your arms over your chest. 4. Tip your chin a little bit toward your chest but do not bend your neck. 5. Tighten your belly muscles and slowly raise your chest just enough to lift your shoulder blades a tiny bit off of the floor. Avoid raising your body higher than that, because it can put too much stress on your low back. 6. Slowly lower your chest and your head to the floor. Back lifts Do these steps 5-10 times in a row: 1. Lie on your belly (face-down) with your arms at your sides, and rest your forehead on the floor. 2. Tighten the muscles in your legs and your butt. 3. Slowly lift your chest off of the floor while you keep your hips on the floor. Keep the back of your head in line with the curve in your back. Look at the floor while you do this. 4. Stay in this position for 3-5 seconds. 5. Slowly lower your chest and your face to the floor. Contact a doctor if:  Your back pain gets a lot worse when you do an exercise.  Your back pain does not get better 2 hours after you exercise. If you have any of these problems, stop doing the exercises. Do not do them again unless your doctor says it is okay. Get help right away if:  You have sudden, very bad back pain. If this happens, stop doing the exercises. Do not do them again unless your doctor says it is okay. This information is not intended to replace advice given to you by your health care provider. Make sure you discuss any questions you have with your health care provider. Document Released: 05/28/2010 Document Revised: 01/18/2018 Document Reviewed: 01/18/2018 Elsevier Patient Education  2020 [employer].

## 2018-12-05 NOTE — Progress Notes (Signed)
Subjective:    Patient ID: Carrie Hall, female    DOB: December 11, 1933, 85 y.o.   MRN: [id number]  HPI  Pt has rt si area pain that came on this weekend this saturday. Pt comes and goes. Occasional move/change in position will cause severe pain. No currently. She state random sharp pain for seconds. No radicular pain.  Pt was cleaning her house for 2 days. She states she cleans house very slowly due to age. Was freezing corn also and bending over various time during freeze corn process..  No hx of back pain. Occasional transient brief self limited pain that does away 1-2 days.  Pt has no uti type symptoms. Though she did hydrate more recently in case her back pain may have been from kidneys per her report.  Pt has good kidney function. Hx of mild reflux in past. But no recent symptoms.     Review of Systems  Constitutional: Negative for chills, fatigue and fever.  Respiratory: Negative for choking, chest tightness, shortness of breath and wheezing.   Cardiovascular: Negative for chest pain and palpitations.  Gastrointestinal: Negative for abdominal distention, abdominal pain, constipation, diarrhea and nausea.  Genitourinary: Negative for dysuria, flank pain, frequency, pelvic pain, urgency and vaginal bleeding.  Musculoskeletal:       Rt si area pain. See hpi.  Neurological: Negative for dizziness, syncope, weakness, numbness and headaches.  Hematological: Negative for adenopathy. Does not bruise/bleed easily.  Psychiatric/Behavioral: Negative for behavioral problems.   Past Medical History:  Diagnosis Date  . Adenomatous colon polyp   . Breast cancer ([place])   . COPD (chronic obstructive pulmonary disease) ([place])   . Diverticulosis   . Hyperlipidemia   . Internal hemorrhoids   . Osteopenia   . Thyroid disease      Social History   Socioeconomic History  . Marital status: Widowed    Spouse name: Not on file  . Number of children: Not on file  . Years of education: Not  on file  . Highest education level: Not on file  Occupational History  . Not on file  Social Needs  . Financial resource strain: Not on file  . Food insecurity    Worry: Not on file    Inability: Not on file  . Transportation needs    Medical: Not on file    Non-medical: Not on file  Tobacco Use  . Smoking status: Former Smoker    Packs/day: 0.50    Types: Cigarettes    Quit date: 12/06/2003    Years since quitting: 15.0  . Smokeless tobacco: Never Used  Substance and Sexual Activity  . Alcohol use: No  . Drug use: No  . Sexual activity: Never    Partners: Male  Lifestyle  . Physical activity    Days per week: Not on file    Minutes per session: Not on file  . Stress: Not on file  Relationships  . Social Herbalist on phone: Not on file    Gets together: Not on file    Attends religious service: Not on file    Active member of club or organization: Not on file    Attends meetings of clubs or organizations: Not on file    Relationship status: Not on file  . Intimate partner violence    Fear of current or ex partner: Not on file    Emotionally abused: Not on file    Physically abused: Not on file  Forced sexual activity: Not on file  Other Topics Concern  . 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 BREAST LUMPECTOMY Left    radiation only  . BREAST SURGERY    . colon polyps      Family History  Problem Relation Age of Onset  . Alzheimer's disease Mother   . Cancer Father        lung  . Alzheimer's disease Maternal Grandmother   . Rectal cancer Sister        in her 59s    Allergies  Allergen Reactions  . Pneumococcal Vaccines     Current Outpatient Medications on File Prior to Visit  Medication Sig Dispense Refill  . albuterol (PROVENTIL HFA;VENTOLIN  HFA) 108 (90 Base) MCG/ACT inhaler Inhale 2 puffs into the lungs every 4 (four) hours as needed for wheezing or shortness of breath. 1 Inhaler 0  . alendronate (FOSAMAX) 70 MG tablet Take 1 tablet (70 mg total) by mouth every 7 (seven) days. Take with a full glass of water on an empty stomach. 4 tablet 11  . ALPRAZolam (XANAX) 0.25 MG tablet Take 1 tablet (0.25 mg total) by mouth at bedtime as needed for anxiety. 30 tablet 0  . atorvastatin (LIPITOR) 10 MG tablet Take 1 tablet by mouth once daily 90 tablet 0  . donepezil (ARICEPT) 10 MG tablet Take 1 tablet (10 mg total) by mouth at bedtime. 90 tablet 3  . EPINEPHrine 0.3 mg/0.3 mL IJ SOAJ injection INJECT CONTENTS OF 1 PEN AS NEEDED FOR ALLERGIC REACTION    . folic acid (FOLVITE) 1 MG tablet Take 1 mg by mouth daily.    Marland Kitchen levothyroxine (SYNTHROID) 88 MCG tablet Take 1 tablet (88 mcg total) by mouth daily. 90 tablet 1  . methotrexate (50 MG/ML) 1 g injection Inject into the vein once a week.    . ranitidine (ZANTAC) 150 MG capsule Take 1 capsule (150 mg total) by mouth 2 (two) times daily for 5 days. 10 capsule 0   No current facility-administered medications on file prior to visit.     BP (!) 150/53   Pulse 77   Temp 98.2 F (36.8 C) (Oral)   Resp 16   Ht 5\' 5"  (1.651 m)   Wt 144 lb 6.4 oz (65.5 kg)   SpO2 96%   BMI 24.03 kg/m       Objective:   Physical Exam  General Appearance- Not in acute distress.    Chest and Lung Exam Auscultation: Breath sounds:-Normal. Clear even and unlabored. Adventitious sounds:- No Adventitious sounds.  Cardiovascular Auscultation:Rythm - Regular, rate and rythm. Heart Sounds -Normal heart sounds.  Abdomen Inspection:-Inspection Normal.  Palpation/Perucssion: Palpation and Percussion of the abdomen reveal- Non Tender, No Rebound tenderness, No rigidity(Guarding) and No Palpable abdominal masses.  Liver:-Normal.  Spleen:- Normal.   Back No mid lumbar spine tenderness to palpation. Pain  on straight leg lift. Pain on lateral movements and flexion/extension of the spine.  Lower ext neurologic  L5-S1 sensation intact bilaterally. Normal patellar reflexes bilaterally. No foot drop bilaterally.     Assessment & Plan:  You do appear to have very mild transient intermittent localized sciatica pain. I want you to try low dose 200 mg ibuprofen over the  counter 1 tab every 8 hours only for 2 days.   Can try stretching exercises as tolerated if you feel comfortable doing.  Also can try over the counter  lidocaine patch  If pain persists or worsens by next week would recommend xray l spine.  Follow up 10-14 days telephone virtual visit.  25 minutes spent with pt. 50% of time spent counseling on dx and tx plan.  Mackie Pai, PA-C

## 2018-12-17 ENCOUNTER — Other Ambulatory Visit: Payer: Self-pay | Admitting: Family Medicine

## 2018-12-17 ENCOUNTER — Ambulatory Visit: Payer: Medicare HMO | Admitting: Family Medicine

## 2018-12-26 DIAGNOSIS — Z79899 Other long term (current) drug therapy: Secondary | ICD-10-CM | POA: Diagnosis not present

## 2018-12-26 DIAGNOSIS — L408 Other psoriasis: Secondary | ICD-10-CM | POA: Diagnosis not present

## 2019-01-17 DIAGNOSIS — Z79899 Other long term (current) drug therapy: Secondary | ICD-10-CM | POA: Diagnosis not present

## 2019-01-17 DIAGNOSIS — L408 Other psoriasis: Secondary | ICD-10-CM | POA: Diagnosis not present

## 2019-01-29 ENCOUNTER — Encounter: Payer: Self-pay | Admitting: Neurology

## 2019-01-31 ENCOUNTER — Telehealth (INDEPENDENT_AMBULATORY_CARE_PROVIDER_SITE_OTHER): Payer: Medicare HMO | Admitting: Neurology

## 2019-01-31 ENCOUNTER — Other Ambulatory Visit: Payer: Self-pay

## 2019-01-31 VITALS — Ht 64.0 in | Wt 145.0 lb

## 2019-01-31 DIAGNOSIS — F0390 Unspecified dementia without behavioral disturbance: Secondary | ICD-10-CM

## 2019-01-31 DIAGNOSIS — F03A0 Unspecified dementia, mild, without behavioral disturbance, psychotic disturbance, mood disturbance, and anxiety: Secondary | ICD-10-CM

## 2019-01-31 DIAGNOSIS — R69 Illness, unspecified: Secondary | ICD-10-CM | POA: Diagnosis not present

## 2019-01-31 MED ORDER — DONEPEZIL HCL 10 MG PO TABS: 10.0000 mg | ORAL_TABLET | Freq: Every day | ORAL | 3 refills | Status: DC

## 2019-01-31 NOTE — Progress Notes (Signed)
Virtual Visit via Video Note The purpose of this virtual visit is to provide medical care while limiting exposure to the novel coronavirus.    Consent was obtained for video visit:  Yes.   Answered questions that patient had about telehealth interaction:  Yes.   I discussed the limitations, risks, security and privacy concerns of performing an evaluation and management service by telemedicine. I also discussed with the patient that there may be a patient responsible charge related to this service. The patient expressed understanding and agreed to proceed.  Pt location: Home Physician Location: office Name of referring provider:  [name], * I connected with Carrie Hall at patients initiation/request on 01/31/2019 at 10:00 AM EDT by video enabled telemedicine application and verified that I am speaking with the correct person using two identifiers. Pt MRN:  [id number] Pt DOB:  1934/01/08 Video Participants:  Carrie Hall;  Dorna Mai (son)   History of Present Illness:  The patient was seen as a virtual video visit on 01/31/2019. Her son is present during the e-visit to provide additional information. She was last seen 7 months ago for worsening memory. [name] score 14/30 in February 2020 (25/30 in June 2019). Donepezil dose increased to 10mg  daily on last visit. She repots her memory is pretty good. Her son feels her memory is the same. She continues to live alone, her sons come daily to check on her. She states she occasionally forgets her medications and has not used her pillbox. Her son manages finances. She does not drive. She showers three times a week, her son denies any hygiene concerns, dishes and laundry are done. She is independent with dressing and bathing. She has occasional trouble maintaining sleep, waking up at 3am and unable to go back to sleep. She can take 30-36mins naps. Appetite is good, she continues to cook and denies leaving the stove on. Mood is good. Her  son denies any paranoia or hallucinations.   History on Initial Assessment 11/01/2017: This is an 85 year old right-handed woman with a history of hyperlipidemia, hypothyroidism, breast cancer, presenting for evaluation of worsening memory. When asked about her memory, she states she "forgets sometimes." Her daughter-in-law appears hesitant to provide additional information saying she does not want to hurt her feelings, but notes she repeats herself several times the same day. She lives alone, they report she cooks and cleans, no hygiene concerns. Her son has been managing bills for the past 5 years since the patient's husband passed away. She fills her own pillbox and states she forgets a dose once in a while. Her family does not monitor medications so they cannot confirm this. She notes word-finding difficulties. She does not drive. She denies misplacing things frequently or leaving the stove on. She seems more anxious and has prn Xanax, but does not take this regularly. She took a dose last night and this morning nervous about today's visit. Her maternal grandmother, several cousins, and mother had Alzheimer's dementia. No history of significant head injuries or alcohol intake. She had an MMSE at PCP office in April 2017 which was 21/30 and was started on Donepezil but appears to have stopped it, she does not recall any side effects. Last MMSE at PCP office a few days ago was 25/30. She had an MRI brain without contrast in 08/2015 which I personally reviewed, no acute changes, there was mild to moderate chronic microvascular disease and mild diffuse atrophy.  She denies any headaches, dizziness, vision  changes, dysarthria/dysphagia, neck/back pain, focal numbness/tingling/weakness. She has constipation but would get diarrhea with spicy foods.     Current Outpatient Medications on File Prior to Visit  Medication Sig Dispense Refill   alendronate (FOSAMAX) 70 MG tablet Take 1 tablet (70 mg total) by mouth  every 7 (seven) days. Take with a full glass of water on an empty stomach. 4 tablet 11   ALPRAZolam (XANAX) 0.25 MG tablet Take 1 tablet (0.25 mg total) by mouth at bedtime as needed for anxiety. 30 tablet 0   atorvastatin (LIPITOR) 10 MG tablet Take 1 tablet by mouth once daily 90 tablet 0   EPINEPHrine 0.3 mg/0.3 mL IJ SOAJ injection INJECT CONTENTS OF 1 PEN AS NEEDED FOR ALLERGIC REACTION     folic acid (FOLVITE) 1 MG tablet Take 1 mg by mouth daily.     levothyroxine (SYNTHROID) 88 MCG tablet Take 1 tablet (88 mcg total) by mouth daily. 90 tablet 1   albuterol (PROVENTIL HFA;VENTOLIN HFA) 108 (90 Base) MCG/ACT inhaler Inhale 2 puffs into the lungs every 4 (four) hours as needed for wheezing or shortness of breath. (Patient not taking: Reported on 01/29/2019) 1 Inhaler 0   ranitidine (ZANTAC) 150 MG capsule Take 1 capsule (150 mg total) by mouth 2 (two) times daily for 5 days. 10 capsule 0   No current facility-administered medications on file prior to visit.      Observations/Objective:   Vitals:   01/29/19 1532  Weight: 145 lb (65.8 kg)  Height: 5\' 4"  (1.626 m)   GEN:  The patient appears stated age and is in NAD. Neurological exam: alert and oriented to person, place, month. No aphasia or dysarthria. Fund of knowledge is appropriate.  Recent and remote memory are impaired.  Attention and concentration are reduced.    Able to name objects, difficulty with repetition. Cranial nerves: Extraocular movements intact with no nystagmus. No facial asymmetry. Motor: moves all extremities symmetrically, at least anti-gravity x 4. No incoordination on finger to nose testing. Gait: narrow-based and steady, able to tandem walk adequately. Negative Romberg test.  Montreal Cognitive Assessment Blind 01/31/2019   Attention: Read list of digits (0/2) 1   Attention: Read list of letters (0/1) 0   Attention: Serial 7 subtraction starting at 100 (0/3) 0   Language: Repeat phrase (0/2) 1   Language  : Fluency (0/1) 0   Abstraction (0/2) 0   Delayed Recall (0/5) 3   Orientation (0/6) 4   Total 9   Adjusted Score (based on education) 10     Assessment and Plan:  This is an 85 yo RH woman with a history of hyperlipidemia, hypothyroidism, breast cancer, with mild dementia. MOCA blind score (done over phone) 10/22 ([place] 14/30 in February 2020, 25/30 in June 2019). No behavioral, personality, or significant sleep changes. Continue Donepezil 10mg  daily. Family instructed to start checking behind her with medication. Continue close supervision. She does not drive. She will follow-up in 6 months and knows to call for any changes.   Follow Up Instructions:   -I discussed the assessment and treatment plan with the patient. The patient was provided an opportunity to ask questions and all were answered. The patient agreed with the plan and demonstrated an understanding of the instructions.   The patient was advised to call back or seek an in-person evaluation if the symptoms worsen or if the condition fails to improve as anticipated.     Cameron Sprang, MD

## 2019-03-04 DIAGNOSIS — H353132 Nonexudative age-related macular degeneration, bilateral, intermediate dry stage: Secondary | ICD-10-CM | POA: Diagnosis not present

## 2019-03-04 DIAGNOSIS — H35039 Hypertensive retinopathy, unspecified eye: Secondary | ICD-10-CM | POA: Diagnosis not present

## 2019-03-11 ENCOUNTER — Other Ambulatory Visit (HOSPITAL_COMMUNITY)
Admission: RE | Admit: 2019-03-11 | Discharge: 2019-03-11 | Disposition: A | Discharge status: Home / Self Care | Payer: Medicare HMO | Source: Ambulatory Visit | Attending: Family Medicine | Admitting: Family Medicine

## 2019-03-11 ENCOUNTER — Ambulatory Visit (INDEPENDENT_AMBULATORY_CARE_PROVIDER_SITE_OTHER): Payer: Medicare HMO | Admitting: Family Medicine

## 2019-03-11 ENCOUNTER — Encounter: Payer: Self-pay | Admitting: Family Medicine

## 2019-03-11 ENCOUNTER — Other Ambulatory Visit: Payer: Self-pay

## 2019-03-11 VITALS — BP 130/80 | HR 62 | Temp 97.9°F | Resp 18 | Ht 64.0 in | Wt 150.6 lb

## 2019-03-11 DIAGNOSIS — R300 Dysuria: Secondary | ICD-10-CM | POA: Diagnosis not present

## 2019-03-11 DIAGNOSIS — E785 Hyperlipidemia, unspecified: Secondary | ICD-10-CM

## 2019-03-11 DIAGNOSIS — R197 Diarrhea, unspecified: Secondary | ICD-10-CM

## 2019-03-11 DIAGNOSIS — E039 Hypothyroidism, unspecified: Secondary | ICD-10-CM | POA: Diagnosis not present

## 2019-03-11 DIAGNOSIS — J410 Simple chronic bronchitis: Secondary | ICD-10-CM | POA: Diagnosis not present

## 2019-03-11 DIAGNOSIS — J449 Chronic obstructive pulmonary disease, unspecified: Secondary | ICD-10-CM | POA: Diagnosis not present

## 2019-03-11 DIAGNOSIS — Z23 Encounter for immunization: Secondary | ICD-10-CM | POA: Diagnosis not present

## 2019-03-11 LAB — POCT URINALYSIS DIP (MANUAL ENTRY)
Bilirubin, UA: NEGATIVE
Blood, UA: NEGATIVE
Glucose, UA: NEGATIVE mg/dL
Ketones, POC UA: NEGATIVE mg/dL
Leukocytes, UA: NEGATIVE
Nitrite, UA: NEGATIVE
Protein Ur, POC: NEGATIVE mg/dL
Spec Grav, UA: 1.01 (ref 1.010–1.025)
Urobilinogen, UA: 0.2 E.U./dL
pH, UA: 6 (ref 5.0–8.0)

## 2019-03-11 MED ORDER — ADVAIR HFA 115-21 MCG/ACT IN AERO: 2.0000 | INHALATION_SPRAY | Freq: Two times a day (BID) | RESPIRATORY_TRACT | 12 refills | Status: DC

## 2019-03-11 NOTE — Assessment & Plan Note (Signed)
Check labs esp since pt having diarrhea and dose was adjusted last time we checked tsh

## 2019-03-11 NOTE — Assessment & Plan Note (Signed)
Check labs Check stool culture / c diff I fob

## 2019-03-11 NOTE — Patient Instructions (Signed)
Budesonide; Formoterol Inhalation What is this medicine? BUDESONIDE; FORMOTEROL (byoo DES oh nide; for MOH te rol) inhalation is a combination of 2 medicines that decrease inflammation and help to open up the airways in your lungs. It is used to treat asthma. It is also used to treat chronic obstructive pulmonary disease (COPD), including chronic bronchitis or emphysema. Do NOT use for an acute asthma or COPD attack. This medicine may be used for other purposes; ask your health care provider or pharmacist if you have questions. COMMON BRAND NAME(S): Symbicort What should I tell my health care provider before I take this medicine? They need to know if you have any of these conditions:  bone problems  diabetes  heart disease or irregular heartbeat  high blood pressure  immune system problems  infection  liver disease  worsening asthma  an unusual or allergic reaction to budesonide, formoterol, medicines, foods, dyes, or preservatives  pregnant or trying to get pregnant  breast-feeding How should I use this medicine? This medicine is inhaled through the mouth. Rinse your mouth with water after use. Make sure not to swallow the water. Follow the directions on your prescription label. Do not use more often than directed. Do not stop taking except on your doctor's advice. Make sure that you are using your inhaler correctly. Ask your doctor or health care provider if you have any questions. A special MedGuide will be given to you by the pharmacist with each prescription and refill. Be sure to read this information carefully each time. Talk to your pediatrician regarding the use of this medicine in children. While this drug may be prescribed for children as young as 6 years of age for selected conditions, precautions do apply. Overdosage: If you think you have taken too much of this medicine contact a poison control center or emergency room at once. NOTE: This medicine is only for you. Do  not share this medicine with others. What if I miss a dose? If you miss a dose, use it as soon as you remember. If it is almost time for your next dose, use only that dose and continue with your regular schedule, spacing doses evenly. Do not use double or extra doses. What may interact with this medicine? Do not take this medicine with any of the following medications:  MAOIs like Carbex, Eldepryl, Marplan, Nardil, and Parnate  mifepristone  probucol  procarbazine  some other medicines for asthma like formoterol, salmeterol This medicine may also interact with the following medications:  antibiotics like clarithromycin, erythromycin  cimetidine  diuretics  grapefruit juice  itraconazole  ketoconazole  medicines for depression, anxiety, or psychotic disturbances  medicines for irregular heartbeat  methadone  some heart medicines like atenolol, metoprolol  some other medicines for breathing problems  some vaccines This list may not describe all possible interactions. Give your health care provider a list of all the medicines, herbs, non-prescription drugs, or dietary supplements you use. Also tell them if you smoke, drink alcohol, or use illegal drugs. Some items may interact with your medicine. What should I watch for while using this medicine? Tell your doctor or health care professional if your symptoms do not improve or get worse. Do not use this medicine more than every 12 hours. NEVER use this medicine for an acute asthma or COPD attack. You should use your short-acting rescue inhalers for this purpose. If your symptoms get worse or if you need your short-acting inhalers more often, call your doctor right away. This medicine   may increase your risk of getting an infection. Tell your doctor or health care professional if you are around anyone with measles or chickenpox, or if you develop sores or blisters that do not heal properly. What side effects may I notice from  receiving this medicine? Side effects that you should report to your doctor or health care professional as soon as possible:  allergic reactions such as skin rash or itching, hives, swelling of the face, lips or tongue  breathing problems  changes in vision  chest pain  fast, irregular heartbeat  feeling faint or lightheaded, falls  fever  high blood pressure  nervousness  tremors  white patches or sores in mouth Side effects that usually do not require medical attention (report to your doctor or health care professional if they continue or are bothersome):  cough  different taste in mouth  headache  sore throat  stuffy nose  stomach upset This list may not describe all possible side effects. Call your doctor for medical advice about side effects. You may report side effects to FDA at 1-800-FDA-1088. Where should I keep my medicine? Keep out of the reach of children. Store in a dry place at room temperature between 20 and 25 degrees C (68 and 77 degrees F). Do not get the inhaler wet. Keep track of the number of doses used. Throw away the inhaler after using the marked number of inhalations or after the expiration date, whichever comes first. Do not burn or puncture canister. NOTE: This sheet is a summary. It may not cover all possible information. If you have questions about this medicine, talk to your doctor, pharmacist, or health care provider.  2020 Elsevier/Gold Standard (2016-04-28 15:25:18)  

## 2019-03-11 NOTE — Progress Notes (Signed)
Patient ID: Carrie Hall, female    DOB: 1933-07-22  Age: 85 y.o. MRN: [id number]    Subjective:  Subjective  HPI Carrie Hall presents for c/o trouble urinating ---- it is better today No abd pain    She had dysuria , frequency  She also c/o diarrhea for years that comes and goes      Review of Systems  Constitutional: Negative for appetite change, chills, diaphoresis, fatigue, fever and unexpected weight change.  Eyes: Negative for pain, redness and visual disturbance.  Respiratory: Positive for cough and wheezing. Negative for chest tightness and shortness of breath.   Cardiovascular: Negative for chest pain, palpitations and leg swelling.  Gastrointestinal: Positive for diarrhea. Negative for abdominal distention, abdominal pain, anal bleeding, blood in stool, constipation, nausea, rectal pain and vomiting.  Endocrine: Negative for cold intolerance, heat intolerance, polydipsia, polyphagia and polyuria.  Genitourinary: Positive for difficulty urinating, dysuria and frequency. Negative for hematuria.       Vaginal itching   Neurological: Negative for dizziness, light-headedness, numbness and headaches.    History Past Medical History:  Diagnosis Date  . Adenomatous colon polyp   . Breast cancer ([place])   . COPD (chronic obstructive pulmonary disease) ([place])   . Diverticulosis   . Hyperlipidemia   . Internal hemorrhoids   . Osteopenia   . Thyroid disease     She has a past surgical history that includes colon polyps; Breast surgery; and Breast lumpectomy (Left).   Her family history includes Alzheimer's disease in her maternal grandmother and mother; Cancer in her father; Rectal cancer in her sister.She reports that she quit smoking about 15 years ago. Her smoking use included cigarettes. She smoked 0.50 packs per day. She has never used smokeless tobacco. She reports that she does not drink alcohol or use drugs.  Current Outpatient Medications on File Prior to Visit   Medication Sig Dispense Refill  . alendronate (FOSAMAX) 70 MG tablet Take 1 tablet (70 mg total) by mouth every 7 (seven) days. Take with a full glass of water on an empty stomach. 4 tablet 11  . ALPRAZolam (XANAX) 0.25 MG tablet Take 1 tablet (0.25 mg total) by mouth at bedtime as needed for anxiety. 30 tablet 0  . atorvastatin (LIPITOR) 10 MG tablet Take 1 tablet by mouth once daily 90 tablet 0  . donepezil (ARICEPT) 10 MG tablet Take 1 tablet (10 mg total) by mouth at bedtime. 90 tablet 3  . EPINEPHrine 0.3 mg/0.3 mL IJ SOAJ injection INJECT CONTENTS OF 1 PEN AS NEEDED FOR ALLERGIC REACTION    . folic acid (FOLVITE) 1 MG tablet Take 1 mg by mouth daily.    Marland Kitchen levothyroxine (SYNTHROID) 88 MCG tablet Take 1 tablet (88 mcg total) by mouth daily. 90 tablet 1  . albuterol (PROVENTIL HFA;VENTOLIN HFA) 108 (90 Base) MCG/ACT inhaler Inhale 2 puffs into the lungs every 4 (four) hours as needed for wheezing or shortness of breath. (Patient not taking: Reported on 01/29/2019) 1 Inhaler 0  . ranitidine (ZANTAC) 150 MG capsule Take 1 capsule (150 mg total) by mouth 2 (two) times daily for 5 days. 10 capsule 0   No current facility-administered medications on file prior to visit.      Objective:  Objective  Physical Exam Vitals signs and nursing note reviewed.  Constitutional:      Appearance: She is well-developed.  HENT:     Head: Normocephalic and atraumatic.  Eyes:     Conjunctiva/sclera: Conjunctivae normal.  Neck:     Musculoskeletal: Normal range of motion and neck supple.     Thyroid: No thyromegaly.     Vascular: No carotid bruit or JVD.  Cardiovascular:     Rate and Rhythm: Normal rate and regular rhythm.     Heart sounds: Normal heart sounds. No murmur.  Pulmonary:     Effort: Pulmonary effort is normal. No respiratory distress.     Breath sounds: Wheezing present. No rhonchi or rales.  Chest:     Chest wall: No tenderness.  Abdominal:     General: There is no distension.      Tenderness: There is no abdominal tenderness. There is no guarding or rebound.  Neurological:     Mental Status: She is alert and oriented to person, place, and time.    BP 130/80 (BP Location: Right Arm, Patient Position: Sitting, Cuff Size: Normal)   Pulse 62   Temp 97.9 F (36.6 C) (Temporal)   Resp 18   Ht 5\' 4"  (1.626 m)   Wt 150 lb 9.6 oz (68.3 kg)   SpO2 95%   BMI 25.85 kg/m  Wt Readings from Last 3 Encounters:  03/11/19 150 lb 9.6 oz (68.3 kg)  01/29/19 145 lb (65.8 kg)  12/05/18 144 lb 6.4 oz (65.5 kg)     Lab Results  Component Value Date   WBC 7.4 08/07/2017   HGB 13.5 08/07/2017   HCT 40.1 08/07/2017   PLT 402.0 (H) 08/07/2017   GLUCOSE 91 11/01/2018   CHOL 183 11/01/2018   TRIG 113.0 11/01/2018   HDL 55.40 11/01/2018   LDLDIRECT 103.3 03/07/2014   LDLCALC 105 (H) 11/01/2018   ALT 9 11/01/2018   AST 13 11/01/2018   NA 142 11/01/2018   K 4.5 11/01/2018   CL 102 11/01/2018   CREATININE 0.62 11/01/2018   BUN 12 11/01/2018   CO2 34 (H) 11/01/2018   TSH 0.25 (L) 11/01/2018   HGBA1C 6.1 07/20/2012    No results found.   Assessment & Plan:  Plan  I am having Carrie Hall start on Advair HFA. I am also having her maintain her folic acid, ranitidine, albuterol, EPINEPHrine, ALPRAZolam, levothyroxine, alendronate, atorvastatin, and donepezil.  Meds ordered this encounter  Medications  . fluticasone-salmeterol (ADVAIR HFA) 115-21 MCG/ACT inhaler    Sig: Inhale 2 puffs into the lungs 2 (two) times daily.    Dispense:  1 Inhaler    Refill:  12    Problem List Items Addressed This Visit      Unprioritized   COPD with chronic bronchitis (HCC)    con't albuterol  Add advair        Relevant Medications   fluticasone-salmeterol (ADVAIR HFA) 115-21 MCG/ACT inhaler   Diarrhea    Check labs Check stool culture / c diff I fob      Relevant Orders   CBC with Differential/Platelet   Stool culture   Fecal occult blood, imunochemical   C.  difficile GDH and Toxin A/B   DYSURIA   Relevant Orders   POCT urinalysis dipstick (Completed)   Urine Culture   Urine cytology ancillary only([place])   Hypothyroidism - Primary    Check labs esp since pt having diarrhea and dose was adjusted last time we checked tsh      Relevant Orders   TSH    Other Visit Diagnoses    Need for influenza vaccination       Relevant Orders   Flu Vaccine QUAD High Dose(Fluad) (  Completed)   Simple chronic bronchitis (HCC)       Relevant Medications   fluticasone-salmeterol (ADVAIR HFA) 115-21 MCG/ACT inhaler   Hyperlipidemia, unspecified hyperlipidemia type       Relevant Orders   Lipid panel   CBC with Differential/Platelet   Comprehensive metabolic panel      Follow-up: No follow-ups on file.  [name], DO

## 2019-03-11 NOTE — Assessment & Plan Note (Signed)
con't albuterol  Add advair

## 2019-03-12 LAB — COMPREHENSIVE METABOLIC PANEL
ALT: 9 U/L (ref 0–35)
AST: 13 U/L (ref 0–37)
Albumin: 4.2 g/dL (ref 3.5–5.2)
Alkaline Phosphatase: 82 U/L (ref 39–117)
BUN: 12 mg/dL (ref 6–23)
CO2: 33 mEq/L — ABNORMAL HIGH (ref 19–32)
Calcium: 9.5 mg/dL (ref 8.4–10.5)
Chloride: 99 mEq/L (ref 96–112)
Creatinine, Ser: 0.67 mg/dL (ref 0.40–1.20)
GFR: 84.02 mL/min (ref >60.00)
Glucose, Bld: 82 mg/dL (ref 70–99)
Potassium: 4.5 mEq/L (ref 3.5–5.1)
Sodium: 139 mEq/L (ref 135–145)
Total Bilirubin: 0.6 mg/dL (ref 0.2–1.2)
Total Protein: 6.4 g/dL (ref 6.0–8.3)

## 2019-03-12 LAB — CBC WITH DIFFERENTIAL/PLATELET
Basophils Absolute: 0.1 10*3/uL (ref 0.0–0.1)
Basophils Relative: 1.2 % (ref 0.0–3.0)
Eosinophils Absolute: 0.1 10*3/uL (ref 0.0–0.7)
Eosinophils Relative: 1.1 % (ref 0.0–5.0)
HCT: 42.4 % (ref 36.0–46.0)
Hemoglobin: 14 g/dL (ref 12.0–15.0)
Lymphocytes Relative: 18.6 % (ref 12.0–46.0)
Lymphs Abs: 1.8 10*3/uL (ref 0.7–4.0)
MCHC: 33.1 g/dL (ref 30.0–36.0)
MCV: 88.7 fl (ref 78.0–100.0)
Monocytes Absolute: 1 10*3/uL (ref 0.1–1.0)
Monocytes Relative: 10.3 % (ref 3.0–12.0)
Neutro Abs: 6.5 10*3/uL (ref 1.4–7.7)
Neutrophils Relative %: 68.8 % (ref 43.0–77.0)
Platelets: 235 10*3/uL (ref 150.0–400.0)
RBC: 4.78 Mil/uL (ref 3.87–5.11)
RDW: 14.2 % (ref 11.5–15.5)
WBC: 9.5 10*3/uL (ref 4.0–10.5)

## 2019-03-12 LAB — LIPID PANEL
Cholesterol: 175 mg/dL (ref 0–200)
HDL: 55.9 mg/dL (ref >39.00)
LDL Cholesterol: 95 mg/dL (ref 0–99)
NonHDL: 119.16
Total CHOL/HDL Ratio: 3
Triglycerides: 120 mg/dL (ref 0.0–149.0)
VLDL: 24 mg/dL (ref 0.0–40.0)

## 2019-03-12 LAB — TSH: TSH: 0.88 u[IU]/mL (ref 0.35–4.50)

## 2019-03-13 ENCOUNTER — Other Ambulatory Visit: Payer: Self-pay | Admitting: Family Medicine

## 2019-03-13 DIAGNOSIS — N390 Urinary tract infection, site not specified: Secondary | ICD-10-CM

## 2019-03-13 LAB — URINE CULTURE
MICRO NUMBER:: 1054551
SPECIMEN QUALITY:: ADEQUATE

## 2019-03-13 MED ORDER — CIPROFLOXACIN HCL 250 MG PO TABS: 250.0000 mg | ORAL_TABLET | Freq: Two times a day (BID) | ORAL | 0 refills | Status: DC

## 2019-03-14 ENCOUNTER — Other Ambulatory Visit: Payer: Self-pay

## 2019-03-14 ENCOUNTER — Other Ambulatory Visit: Payer: Medicare HMO

## 2019-03-14 DIAGNOSIS — R197 Diarrhea, unspecified: Secondary | ICD-10-CM

## 2019-03-14 NOTE — Progress Notes (Signed)
Pt returned stool culture and IFOB. C.Diff collection bottle contained urine. Called and explained to pt that no further urine is needed and she will need to re-collect the C.diff stool sample. Pt voices understanding and will pick up collection kit tomorrow. Kit placed at front desk for pick up.

## 2019-03-14 NOTE — Addendum Note (Signed)
Addended by: Kelle Darting A on: 03/14/2019 04:15 PM   Modules accepted: Orders

## 2019-03-15 ENCOUNTER — Other Ambulatory Visit: Payer: Medicare HMO

## 2019-03-15 ENCOUNTER — Other Ambulatory Visit: Payer: Self-pay

## 2019-03-15 ENCOUNTER — Other Ambulatory Visit (INDEPENDENT_AMBULATORY_CARE_PROVIDER_SITE_OTHER): Payer: Medicare HMO

## 2019-03-15 DIAGNOSIS — R197 Diarrhea, unspecified: Secondary | ICD-10-CM

## 2019-03-15 LAB — URINE CYTOLOGY ANCILLARY ONLY
Bacterial Vaginitis-Urine: NEGATIVE
Candida Urine: NEGATIVE
Chlamydia: NEGATIVE
Comment: NEGATIVE
Comment: NEGATIVE
Comment: NORMAL
Neisseria Gonorrhea: NEGATIVE
Trichomonas: NEGATIVE

## 2019-03-15 LAB — FECAL OCCULT BLOOD, IMMUNOCHEMICAL: Fecal Occult Bld: NEGATIVE

## 2019-03-15 NOTE — Progress Notes (Signed)
No charge. Pt already charged at previous visit. She initially collected stool specimen incorrectly and returned correct specimen today.

## 2019-03-16 LAB — C. DIFFICILE GDH AND TOXIN A/B
GDH ANTIGEN: NOT DETECTED
MICRO NUMBER:: 1073357
SPECIMEN QUALITY:: ADEQUATE
TOXIN A AND B: NOT DETECTED

## 2019-03-18 ENCOUNTER — Other Ambulatory Visit: Payer: Self-pay | Admitting: Family Medicine

## 2019-03-19 LAB — STOOL CULTURE
MICRO NUMBER:: 1073085
MICRO NUMBER:: 1073086
MICRO NUMBER:: 1073087
SHIGA RESULT:: NOT DETECTED
SPECIMEN QUALITY:: ADEQUATE
SPECIMEN QUALITY:: ADEQUATE
SPECIMEN QUALITY:: ADEQUATE

## 2019-04-03 DIAGNOSIS — L853 Xerosis cutis: Secondary | ICD-10-CM | POA: Diagnosis not present

## 2019-04-03 DIAGNOSIS — L400 Psoriasis vulgaris: Secondary | ICD-10-CM | POA: Diagnosis not present

## 2019-04-19 DIAGNOSIS — H353132 Nonexudative age-related macular degeneration, bilateral, intermediate dry stage: Secondary | ICD-10-CM | POA: Diagnosis not present

## 2019-04-19 DIAGNOSIS — H35039 Hypertensive retinopathy, unspecified eye: Secondary | ICD-10-CM | POA: Diagnosis not present

## 2019-04-22 ENCOUNTER — Other Ambulatory Visit: Payer: Self-pay | Admitting: Family Medicine

## 2019-04-22 DIAGNOSIS — E039 Hypothyroidism, unspecified: Secondary | ICD-10-CM

## 2019-04-28 DIAGNOSIS — Z7722 Contact with and (suspected) exposure to environmental tobacco smoke (acute) (chronic): Secondary | ICD-10-CM | POA: Diagnosis not present

## 2019-04-28 DIAGNOSIS — G309 Alzheimer's disease, unspecified: Secondary | ICD-10-CM | POA: Diagnosis not present

## 2019-04-28 DIAGNOSIS — R32 Unspecified urinary incontinence: Secondary | ICD-10-CM | POA: Diagnosis not present

## 2019-04-28 DIAGNOSIS — R69 Illness, unspecified: Secondary | ICD-10-CM | POA: Diagnosis not present

## 2019-04-28 DIAGNOSIS — E785 Hyperlipidemia, unspecified: Secondary | ICD-10-CM | POA: Diagnosis not present

## 2019-04-28 DIAGNOSIS — Z79899 Other long term (current) drug therapy: Secondary | ICD-10-CM | POA: Diagnosis not present

## 2019-04-28 DIAGNOSIS — Z803 Family history of malignant neoplasm of breast: Secondary | ICD-10-CM | POA: Diagnosis not present

## 2019-04-28 DIAGNOSIS — L409 Psoriasis, unspecified: Secondary | ICD-10-CM | POA: Diagnosis not present

## 2019-04-28 DIAGNOSIS — K08409 Partial loss of teeth, unspecified cause, unspecified class: Secondary | ICD-10-CM | POA: Diagnosis not present

## 2019-04-28 DIAGNOSIS — Z008 Encounter for other general examination: Secondary | ICD-10-CM | POA: Diagnosis not present

## 2019-04-28 DIAGNOSIS — Z809 Family history of malignant neoplasm, unspecified: Secondary | ICD-10-CM | POA: Diagnosis not present

## 2019-05-06 ENCOUNTER — Other Ambulatory Visit: Payer: Self-pay

## 2019-05-07 ENCOUNTER — Other Ambulatory Visit: Payer: Self-pay | Admitting: Family Medicine

## 2019-05-07 ENCOUNTER — Ambulatory Visit (INDEPENDENT_AMBULATORY_CARE_PROVIDER_SITE_OTHER): Payer: Medicare HMO | Admitting: Family Medicine

## 2019-05-07 ENCOUNTER — Other Ambulatory Visit: Payer: Self-pay

## 2019-05-07 ENCOUNTER — Encounter: Payer: Self-pay | Admitting: Family Medicine

## 2019-05-07 VITALS — BP 118/80 | HR 70 | Temp 97.4°F | Resp 20 | Ht 64.0 in | Wt 147.0 lb

## 2019-05-07 DIAGNOSIS — J410 Simple chronic bronchitis: Secondary | ICD-10-CM

## 2019-05-07 DIAGNOSIS — Z1231 Encounter for screening mammogram for malignant neoplasm of breast: Secondary | ICD-10-CM | POA: Diagnosis not present

## 2019-05-07 DIAGNOSIS — E785 Hyperlipidemia, unspecified: Secondary | ICD-10-CM | POA: Diagnosis not present

## 2019-05-07 DIAGNOSIS — E039 Hypothyroidism, unspecified: Secondary | ICD-10-CM

## 2019-05-07 DIAGNOSIS — R195 Other fecal abnormalities: Secondary | ICD-10-CM | POA: Diagnosis not present

## 2019-05-07 DIAGNOSIS — Z0000 Encounter for general adult medical examination without abnormal findings: Secondary | ICD-10-CM

## 2019-05-07 LAB — COMPREHENSIVE METABOLIC PANEL
ALT: 8 U/L (ref 0–35)
AST: 13 U/L (ref 0–37)
Albumin: 3.9 g/dL (ref 3.5–5.2)
Alkaline Phosphatase: 80 U/L (ref 39–117)
BUN: 13 mg/dL (ref 6–23)
CO2: 32 mEq/L (ref 19–32)
Calcium: 9.2 mg/dL (ref 8.4–10.5)
Chloride: 101 mEq/L (ref 96–112)
Creatinine, Ser: 0.63 mg/dL (ref 0.40–1.20)
GFR: 90.17 mL/min (ref >60.00)
Glucose, Bld: 106 mg/dL — ABNORMAL HIGH (ref 70–99)
Potassium: 4.1 mEq/L (ref 3.5–5.1)
Sodium: 138 mEq/L (ref 135–145)
Total Bilirubin: 0.6 mg/dL (ref 0.2–1.2)
Total Protein: 6.1 g/dL (ref 6.0–8.3)

## 2019-05-07 LAB — LIPID PANEL
Cholesterol: 190 mg/dL (ref 0–200)
HDL: 49.6 mg/dL (ref >39.00)
LDL Cholesterol: 115 mg/dL — ABNORMAL HIGH (ref 0–99)
NonHDL: 139.97
Total CHOL/HDL Ratio: 4
Triglycerides: 125 mg/dL (ref 0.0–149.0)
VLDL: 25 mg/dL (ref 0.0–40.0)

## 2019-05-07 LAB — TSH: TSH: 0.65 u[IU]/mL (ref 0.35–4.50)

## 2019-05-07 MED ORDER — ADVAIR HFA 115-21 MCG/ACT IN AERO: 2.0000 | INHALATION_SPRAY | Freq: Two times a day (BID) | RESPIRATORY_TRACT | 12 refills | Status: DC

## 2019-05-07 NOTE — Progress Notes (Signed)
Subjective:     Carrie Hall is a 85 y.o. female and is here for a comprehensive physical exam. The patient reports no problems.  Social History   Socioeconomic History  . Marital status: Widowed    Spouse name: Not on file  . Number of children: Not on file  . Years of education: Not on file  . Highest education level: Not on file  Occupational History  . Not on file  Tobacco Use  . Smoking status: Former Smoker    Packs/day: 0.50    Types: Cigarettes    Quit date: 12/06/2003    Years since quitting: 15.4  . Smokeless tobacco: Never Used  Substance and Sexual Activity  . Alcohol use: No  . Drug use: No  . Sexual activity: Never    Partners: Male  Other Topics Concern  . 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 Difficulty of Paying Living Expenses: Not on file  Food Insecurity:   . Worried About Charity fundraiser in the Last Year: Not on file  . Ran Out of Food in the Last Year: Not on file  Transportation Needs:   . Lack of Transportation (Medical): Not on file  . Lack of Transportation (Non-Medical): Not on file  Physical Activity:   . Days of Exercise per Week: Not on file  . Minutes of Exercise per Session: Not on file  Stress:   . Feeling of Stress : Not on file  Social Connections:   . Frequency of Communication with Friends and Family: Not on file  . Frequency of Social Gatherings with Friends and Family: Not on file  . Attends Religious Services: Not on file  . Active Member of Clubs or Organizations: Not on file  . Attends Archivist Meetings: Not on file  . Marital Status: Not on file  Intimate Partner Violence:   . Fear of Current or Ex-Partner: Not on file  . Emotionally Abused: Not on file  . Physically Abused: Not on file  . Sexually Abused:  Not on file   Health Maintenance  Topic Date Due  . MAMMOGRAM  03/13/2018  . TETANUS/TDAP  08/08/2027  . INFLUENZA VACCINE  Completed  . DEXA SCAN  Completed  . PNA vac Low Risk Adult  Completed    The following portions of the patient's history were reviewed and updated as appropriate: She  has a past medical history of Adenomatous colon polyp, Breast cancer ([place]), COPD (chronic obstructive pulmonary disease) ([place]), Diverticulosis, Hyperlipidemia, Internal hemorrhoids, Osteopenia, and Thyroid disease. She does not have any pertinent problems on file. She  has a past surgical history that includes colon polyps; Breast surgery; and Breast lumpectomy (Left). Her family history includes Alzheimer's disease in her maternal grandmother and mother; Cancer in her father; Rectal cancer in her sister. She  reports that she quit smoking about 15 years ago. Her smoking use included cigarettes. She smoked 0.50 packs per day. She has never used smokeless tobacco. She reports that she does not drink alcohol or use drugs. She has a current medication list which includes the following prescription(s): alendronate, alprazolam, atorvastatin, donepezil, epinephrine, advair hfa, folic acid, levothyroxine, albuterol, ciprofloxacin, and ranitidine. Current Outpatient Medications on File Prior  to Visit  Medication Sig Dispense Refill  . alendronate (FOSAMAX) 70 MG tablet Take 1 tablet (70 mg total) by mouth every 7 (seven) days. Take with a full glass of water on an empty stomach. 4 tablet 11  . ALPRAZolam (XANAX) 0.25 MG tablet Take 1 tablet (0.25 mg total) by mouth at bedtime as needed for anxiety. 30 tablet 0  . atorvastatin (LIPITOR) 10 MG tablet Take 1 tablet by mouth once daily 90 tablet 0  . donepezil (ARICEPT) 10 MG tablet Take 1 tablet (10 mg total) by mouth at bedtime. 90 tablet 3  . EPINEPHrine 0.3 mg/0.3 mL IJ SOAJ injection INJECT CONTENTS OF 1 PEN AS NEEDED FOR ALLERGIC REACTION    . folic acid  (FOLVITE) 1 MG tablet Take 1 mg by mouth daily.    Marland Kitchen levothyroxine (SYNTHROID) 88 MCG tablet Take 1 tablet (88 mcg total) by mouth daily before breakfast. 90 tablet 1  . albuterol (PROVENTIL HFA;VENTOLIN HFA) 108 (90 Base) MCG/ACT inhaler Inhale 2 puffs into the lungs every 4 (four) hours as needed for wheezing or shortness of breath. (Patient not taking: Reported on 01/29/2019) 1 Inhaler 0  . ciprofloxacin (CIPRO) 250 MG tablet Take 1 tablet (250 mg total) by mouth 2 (two) times daily. (Patient not taking: Reported on 05/07/2019) 6 tablet 0  . ranitidine (ZANTAC) 150 MG capsule Take 1 capsule (150 mg total) by mouth 2 (two) times daily for 5 days. 10 capsule 0   No current facility-administered medications on file prior to visit.   She is allergic to pneumococcal vaccines..  Review of Systems Review of Systems  Constitutional: Negative for activity change, appetite change and fatigue.  HENT: Negative for hearing loss, congestion, tinnitus and ear discharge.  dentist q59m Eyes: Negative for visual disturbance (see optho q1y -- vision corrected to 20/20 with glasses).  Respiratory: Negative for cough, chest tightness and shortness of breath.   Cardiovascular: Negative for chest pain, palpitations and leg swelling.  Gastrointestinal: Negative for abdominal pain, diarrhea, constipation and abdominal distention.  Genitourinary: Negative for urgency, frequency, decreased urine volume and difficulty urinating.  Musculoskeletal: Negative for back pain, arthralgias and gait problem.  Skin: Negative for color change, pallor and rash.  Neurological: Negative for dizziness, light-headedness, numbness and headaches.  Hematological: Negative for adenopathy. Does not bruise/bleed easily.  Psychiatric/Behavioral: Negative for suicidal ideas, confusion, sleep disturbance, self-injury, dysphoric mood, decreased concentration and agitation.       Objective:    BP 118/80 (BP Location: Right Arm, Patient  Position: Sitting, Cuff Size: Normal)   Pulse 70   Temp (!) 97.4 F (36.3 C) (Temporal)   Resp 20   Ht 5\' 4"  (1.626 m)   Wt 147 lb (66.7 kg)   SpO2 95%   BMI 25.23 kg/m  General appearance: alert, cooperative, appears stated age and no distress Head: Normocephalic, without obvious abnormality, atraumatic Eyes: negative findings: lids and lashes normal, conjunctivae and sclerae normal and pupils equal, round, reactive to light and accomodation Ears: normal TM's and external ear canals both ears Nose: Nares normal. Septum midline. Mucosa normal. No drainage or sinus tenderness. Throat: lips, mucosa, and tongue normal; teeth and gums normal Neck: no adenopathy, no carotid bruit, no JVD, supple, symmetrical, trachea midline and thyroid not enlarged, symmetric, no tenderness/mass/nodules Back: symmetric, no curvature. ROM normal. No CVA tenderness. Lungs: clear to auscultation bilaterally Breasts: normal appearance, no masses or tenderness Heart: regular rate and rhythm, S1, S2 normal, no murmur, click, rub or gallop Abdomen:  soft, non-tender; bowel sounds normal; no masses,  no organomegaly Pelvic: not indicated; post-menopausal, no abnormal Pap smears in past Extremities: extremities normal, atraumatic, no cyanosis or edema Pulses: 2+ and symmetric Skin: Skin color, texture, turgor normal. No rashes or lesions Lymph nodes: Cervical, supraclavicular, and axillary nodes normal. Neurologic: Alert and oriented X 3, normal strength and tone. Normal symmetric reflexes. Normal coordination and gait    Assessment:    Healthy female exam.      Plan:    ghm utd Check labs  See After Visit Summary for Counseling Recommendations    1. Simple chronic bronchitis (HCC) Refill inhaler- stable  - fluticasone-salmeterol (ADVAIR HFA) 115-21 MCG/ACT inhaler; Inhale 2 puffs into the lungs 2 (two) times daily.  Dispense: 1 Inhaler; Refill: 12  2. Hypothyroidism, unspecified type Check labs con't  meds  - TSH  3. Hyperlipidemia, unspecified hyperlipidemia type Tolerating statin, encouraged heart healthy diet, avoid trans fats, minimize simple carbs and saturated fats. Increase exercise as tolerated - Lipid panel - Comprehensive metabolic panel  4. Preventative health care See above   5. Encounter for screening mammogram for malignant neoplasm of breast   - MAOMMOGRAM 2-D TOMO ([facility]); Future  6. Loose stools Becoming more frequent  - Ambulatory referral to Gastroenterology

## 2019-05-07 NOTE — Patient Instructions (Addendum)
Preventive Care 83 Years and Older, Female Preventive care refers to lifestyle choices and visits with your health care provider that can promote health and wellness. This includes:  A yearly physical exam. This is also called an annual well check.  Regular dental and eye exams.  Immunizations.  Screening for certain conditions.  Healthy lifestyle choices, such as diet and exercise.at can I expect for my preventive care visit? Physical exam Your health care provider will check:  Height and weight. These may be used to calculate body mass index (BMI), which is a measurement that tells if you are at a healthy weight.  Heart rate and blood pressure.  Your skin for abnormal spots. Counseling Your health care provider may ask you questions about:  Alcohol, tobacco, and drug use.  Emotional well-being.  Home and relationship well-being.  Sexual activity.  Eating habits.  History of falls.  Memory and ability to understand (cognition).  Work and work Statistician.  Pregnancy and menstrual history. What immunizations do I need?  Influenza (flu) vaccine  This is recommended every year. Tetanus, diphtheria, and pertussis (Tdap) vaccine  You may need a Td booster every 10 years. Varicella (chickenpox) vaccine  You may need this vaccine if you have not already been vaccinated. Zoster (shingles) vaccine  You may need this after age 62. Pneumococcal conjugate (PCV13) vaccine  One dose is recommended after age 65. Pneumococcal polysaccharide (PPSV23) vaccine  One dose is recommended after age 70. Measles, mumps, and rubella (MMR) vaccine  You may need at least one dose of MMR if you were born in 1957 or later. You may also need a second dose. Meningococcal conjugate (MenACWY) vaccine  You may need this if you have certain conditions. Hepatitis A vaccine  You may need this if you have certain conditions or if you travel or work in places where you may be exposed to  hepatitis A. Hepatitis B vaccine  You may need this if you have certain conditions or if you travel or work in places where you may be exposed to hepatitis B. Haemophilus influenzae type b (Hib) vaccine  You may need this if you have certain conditions. You may receive vaccines as individual doses or as more than one vaccine together in one shot (combination vaccines). Talk with your health care provider about the risks and benefits of combination vaccines. What tests do I need? Blood tests  Lipid and cholesterol levels. These may be checked every 5 years, or more frequently depending on your overall health.  Hepatitis C test.  Hepatitis B test. Screening  Lung cancer screening. You may have this screening every year starting at age 45 if you have a 30-pack-year history of smoking and currently smoke or have quit within the past 15 years.  Colorectal cancer screening. All adults should have this screening starting at age 66 and continuing until age 6. Your health care provider may recommend screening at age 4 if you are at increased risk. You will have tests every 1-10 years, depending on your results and the type of screening test.  Diabetes screening. This is done by checking your blood sugar (glucose) after you have not eaten for a while (fasting). You may have this done every 1-3 years.  Mammogram. This may be done every 1-2 years. Talk with your health care provider about how often you should have regular mammograms.  BRCA-related cancer screening. This may be done if you have a family history of breast, ovarian, tubal, or peritoneal cancers. Other  tests  Sexually transmitted disease (STD) testing.  Bone density scan. This is done to screen for osteoporosis. You may have this done starting at age 62. Follow these instructions at home: Eating and drinking  Eat a diet that includes fresh fruits and vegetables, whole grains, lean protein, and low-fat dairy products. Limit your  intake of foods with high amounts of sugar, saturated fats, and salt.  Take vitamin and mineral supplements as recommended by your health care provider.  Do not drink alcohol if your health care provider tells you not to drink.  If you drink alcohol: ? Limit how much you have to 0-1 drink a day. ? Be aware of how much alcohol is in your drink. In the [place] one drink equals one 12 oz bottle of beer (355 mL), one 5 oz glass of wine (148 mL), or one 1 oz glass of hard liquor (44 mL). Lifestyle  Take daily care of your teeth and gums.  Stay active. Exercise for at least 30 minutes on 5 or more days each week.  Do not use any products that contain nicotine or tobacco, such as cigarettes, e-cigarettes, and chewing tobacco. If you need help quitting, ask your health care provider.  If you are sexually active, practice safe sex. Use a condom or other form of protection in order to prevent STIs (sexually transmitted infections).  Talk with your health care provider about taking a low-dose aspirin or statin. What's next?  Go to your health care provider once a year for a well check visit.  Ask your health care provider how often you should have your eyes and teeth checked.  Stay up to date on all vaccines. This information is not intended to replace advice given to you by your health care provider. Make sure you discuss any questions you have with your health care provider. Document Released: 05/22/2015 Document Revised: 04/19/2018 Document Reviewed: 04/19/2018 Elsevier Patient Education  2020 [employer].

## 2019-05-21 ENCOUNTER — Encounter: Payer: Self-pay | Admitting: Family Medicine

## 2019-06-19 ENCOUNTER — Other Ambulatory Visit: Payer: Self-pay | Admitting: Family Medicine

## 2019-07-01 DIAGNOSIS — L408 Other psoriasis: Secondary | ICD-10-CM | POA: Diagnosis not present

## 2019-07-10 ENCOUNTER — Telehealth: Payer: Self-pay | Admitting: Family Medicine

## 2019-07-10 NOTE — Chronic Care Management (AMB) (Signed)
Chronic Care Management   Note  07/10/2019 Name: Carrie Hall MRN: [id number] DOB: [birth year]  Carrie Hall is a 86 y.o. year old female who is a primary care patient of [name], DO. I reached out to [employer] by phone today in response to a referral sent by Carrie Hall's PCP, Carollee Herter, Alferd Apa, DO.   Carrie Hall was given information about Chronic Care Management services today including:  1. CCM service includes personalized support from designated clinical staff supervised by her physician, including individualized plan of care and coordination with other care providers 2. 24/7 contact phone numbers for assistance for urgent and routine care needs. 3. Service will only be billed when office clinical staff spend 20 minutes or more in a month to coordinate care. 4. Only one practitioner may furnish and bill the service in a calendar month. 5. The patient may stop CCM services at any time (effective at the end of the month) by phone call to the office staff. 6. The patient will be responsible for cost sharing (co-pay) of up to 20% of the service fee (after annual deductible is met).  Patient agreed to services and verbal consent obtained.   Follow up plan:   Carrie Hall [employer] Scheduler

## 2019-07-11 DIAGNOSIS — L400 Psoriasis vulgaris: Secondary | ICD-10-CM | POA: Diagnosis not present

## 2019-07-17 ENCOUNTER — Other Ambulatory Visit: Payer: Self-pay

## 2019-07-17 DIAGNOSIS — I10 Essential (primary) hypertension: Secondary | ICD-10-CM

## 2019-07-17 DIAGNOSIS — E785 Hyperlipidemia, unspecified: Secondary | ICD-10-CM

## 2019-07-17 DIAGNOSIS — E039 Hypothyroidism, unspecified: Secondary | ICD-10-CM

## 2019-07-18 DIAGNOSIS — L400 Psoriasis vulgaris: Secondary | ICD-10-CM | POA: Diagnosis not present

## 2019-07-23 DIAGNOSIS — Z79899 Other long term (current) drug therapy: Secondary | ICD-10-CM | POA: Diagnosis not present

## 2019-07-25 DIAGNOSIS — L400 Psoriasis vulgaris: Secondary | ICD-10-CM | POA: Diagnosis not present

## 2019-07-26 ENCOUNTER — Ambulatory Visit: Payer: Medicare HMO | Admitting: Pharmacist

## 2019-07-26 ENCOUNTER — Other Ambulatory Visit: Payer: Self-pay

## 2019-07-26 DIAGNOSIS — E785 Hyperlipidemia, unspecified: Secondary | ICD-10-CM

## 2019-07-26 DIAGNOSIS — E038 Other specified hypothyroidism: Secondary | ICD-10-CM

## 2019-07-26 DIAGNOSIS — J449 Chronic obstructive pulmonary disease, unspecified: Secondary | ICD-10-CM

## 2019-07-26 DIAGNOSIS — R413 Other amnesia: Secondary | ICD-10-CM

## 2019-07-26 DIAGNOSIS — F411 Generalized anxiety disorder: Secondary | ICD-10-CM

## 2019-07-26 DIAGNOSIS — I10 Essential (primary) hypertension: Secondary | ICD-10-CM
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
## 2019-07-26 NOTE — Patient Instructions (Addendum)
Visit Information  Goals Addressed            This Visit's Progress   . Consider getting a repeat DEXA scan       This is a scan of your bones to assess your bone strength    . Consider restarting alendronate 62m once weekly pending Dr. LEtter Sjogrenapproval       This medication will help with your bones    . Pharmacy Care Plan       CARE PLAN ENTRY  Current Barriers:  . Chronic Disease Management support, education, and care coordination needs related to COPD, HTN, HLD, Hypothyroidism, Anxiety, Osteoporosis, Memory Loss, Psorasis  Pharmacist Clinical Goal(s):  .Marland KitchenOver the next 120 days, patient will demonstrate Improved medication adherence as evidenced by appropriate fill dates with assistance from adherence packaging . Patient will take her medications as prescribed . Complete DEXA Scan  Interventions: . Comprehensive medication review performed. .Marland KitchenCollaboration with provider regarding medication management (alendronate)  Patient Self Care Activities:  . Patient verbalizes understanding of plan to follow as described above, Self administers medications as prescribed, Calls pharmacy for medication refills, and Calls provider office for new concerns or questions  Initial goal documentation     . Remember to take your medications as prescribed       Once your medications are placed into packaging, this will help you to remember your medications. I will or a member of the pharmacy team will also start calling you monthly to see how things are going.       [name] given information about Chronic Care Management services today including:  1. CCM service includes personalized support from designated clinical staff supervised by her physician, including individualized plan of care and coordination with other care providers 2. 24/7 contact phone numbers for assistance for urgent and routine care needs. 3. Standard insurance, coinsurance, copays and deductibles apply for chronic  care management only during months in which we provide at least 20 minutes of these services. Most insurances cover these services at 100%, however patients may be responsible for any copay, coinsurance and/or deductible if applicable. This service may help you avoid the need for more expensive face-to-face services. 4. Only one practitioner may furnish and bill the service in a calendar month. 5. The patient may stop CCM services at any time (effective at the end of the month) by phone call to the office staff.  Verbal consent obtained for [facility] enhanced pharmacy services (medication synchronization, adherence packaging, delivery coordination). A medication sync plan was created to allow patient to get all medications delivered once every 30 to 90 days per patient preference. Patient understands they have freedom to choose pharmacy and clinical pharmacist will coordinate care between all prescribers and [facility].    Patient agreed to services and verbal consent obtained.   The patient verbalized understanding of instructions provided today and agreed to receive a mailed copy of patient instruction and/or educational materials. Telephone follow up appointment with pharmacy team member scheduled for: 01/24/2020  Carrie Hall, PharmD Clinical Pharmacist [phone]  Osteoporosis  Osteoporosis happens when your bones get thin and weak. This can cause your bones to break (fracture) more easily. You can do things at home to make your bones stronger. Follow these instructions at home:  Activity  Exercise as told by your doctor. Ask your doctor what activities are safe for you. You should do: ? Exercises that make  your muscles work to hold your body weight up (weight-bearing exercises). These include tai chi, yoga, and walking. ? Exercises to make your muscles stronger. One example is lifting weights. Lifestyle  Limit alcohol  intake to no more than 1 drink a Carrie Hall for nonpregnant women and 2 drinks a Carrie Hall for men. One drink equals 12 oz of beer, 5 oz of wine, or 1 oz of hard liquor.  Do not use any products that have nicotine or tobacco in them. These include cigarettes and e-cigarettes. If you need help quitting, ask your doctor. Preventing falls  Use tools to help you move around (mobility aids) as needed. These include canes, walkers, scooters, and crutches.  Keep rooms well-lit and free of clutter.  Put away things that could make you trip. These include cords and rugs.  Install safety rails on stairs. Install grab bars in bathrooms.  Use rubber mats in slippery areas, like bathrooms.  Wear shoes that: ? Fit you well. ? Support your feet. ? Have closed toes. ? Have rubber soles or low heels.  Tell your doctor about all of the medicines you are taking. Some medicines can make you more likely to fall. General instructions  Eat plenty of calcium and vitamin D. These nutrients are good for your bones. Good sources of calcium and vitamin D include: ? Some fatty fish, such as salmon and tuna. ? Foods that have calcium and vitamin D added to them (fortified foods). For example, some breakfast cereals are fortified with calcium and vitamin D. ? Egg yolks. ? Cheese. ? Liver.  Take over-the-counter and prescription medicines only as told by your doctor.  Keep all follow-up visits as told by your doctor. This is important. Contact a doctor if:  You have not been tested (screened) for osteoporosis and you are: ? A woman who is age 52 or older. ? A man who is age 71 or older. Get help right away if:  You fall.  You get hurt. Summary  Osteoporosis happens when your bones get thin and weak.  Weak bones can break (fracture) more easily.  Eat plenty of calcium and vitamin D. These nutrients are good for your bones.  Tell your doctor about all of the medicines that you take. This information is not  intended to replace advice given to you by your health care provider. Make sure you discuss any questions you have with your health care provider. Document Revised: 04/07/2017 Document Reviewed: 02/17/2017 Elsevier Patient Education  2020 [employer].

## 2019-07-27 ENCOUNTER — Other Ambulatory Visit: Payer: Self-pay | Admitting: Family Medicine

## 2019-07-27 DIAGNOSIS — J410 Simple chronic bronchitis: Secondary | ICD-10-CM

## 2019-07-27 DIAGNOSIS — M8580 Other specified disorders of bone density and structure, unspecified site: Secondary | ICD-10-CM

## 2019-07-27 DIAGNOSIS — E039 Hypothyroidism, unspecified: Secondary | ICD-10-CM

## 2019-07-27 DIAGNOSIS — F411 Generalized anxiety disorder: Secondary | ICD-10-CM

## 2019-07-27 MED ORDER — ALPRAZOLAM 0.25 MG PO TABS: 0.2500 mg | ORAL_TABLET | Freq: Every evening | ORAL | 0 refills | Status: DC | PRN

## 2019-07-27 MED ORDER — ALENDRONATE SODIUM 70 MG PO TABS: 70.0000 mg | ORAL_TABLET | ORAL | 3 refills | Status: DC

## 2019-07-27 MED ORDER — LEVOTHYROXINE SODIUM 88 MCG PO TABS: 88.0000 ug | ORAL_TABLET | Freq: Every day | ORAL | 1 refills | Status: DC

## 2019-07-27 MED ORDER — ALBUTEROL SULFATE HFA 108 (90 BASE) MCG/ACT IN AERS: 2.0000 | INHALATION_SPRAY | RESPIRATORY_TRACT | 1 refills | Status: DC | PRN

## 2019-07-27 MED ORDER — ADVAIR HFA 115-21 MCG/ACT IN AERO: 2.0000 | INHALATION_SPRAY | Freq: Two times a day (BID) | RESPIRATORY_TRACT | 12 refills | Status: DC

## 2019-07-29 ENCOUNTER — Other Ambulatory Visit: Payer: Self-pay

## 2019-07-29 MED ORDER — DONEPEZIL HCL 10 MG PO TABS: 10.0000 mg | ORAL_TABLET | Freq: Every day | ORAL | 3 refills | Status: DC

## 2019-07-31 ENCOUNTER — Telehealth: Payer: Self-pay

## 2019-07-31 NOTE — Telephone Encounter (Signed)
Patient called in to see if it is safe for her to get the Covid -19 vaccine due to the medications she take please follow up with the patient and advise at [phone]

## 2019-07-31 NOTE — Telephone Encounter (Signed)
Spoke with patient. Advised to get shot and advised to tell the administrator that she has had a reaction to the pneumonia shot before.

## 2019-08-01 ENCOUNTER — Ambulatory Visit: Payer: Medicare HMO | Attending: Internal Medicine

## 2019-08-01 DIAGNOSIS — Z23 Encounter for immunization: Secondary | ICD-10-CM

## 2019-08-01 NOTE — Progress Notes (Signed)
   Covid-19 Vaccination Clinic  Name:  CHIARA LAMONS    MRN: [id number] DOB: [birth year]  08/01/2019  Ms. Prier was observed post Covid-19 immunization for 15 minutes without incident. She was provided with Vaccine Information Sheet and instruction to access the V-Safe system.   Ms. Plummer was instructed to call 911 with any severe reactions post vaccine: Marland Kitchen Difficulty breathing  . Swelling of face and throat  . A fast heartbeat  . A bad rash all over body  . Dizziness and weakness   Immunizations Administered    Name Date Dose VIS Date Route   Pfizer COVID-19 Vaccine 08/01/2019 11:55 AM 0.3 mL 04/19/2019 Intramuscular   Manufacturer: [place]   Lot: [id number]   [place]: [id number]

## 2019-08-12 DIAGNOSIS — L400 Psoriasis vulgaris: Secondary | ICD-10-CM | POA: Diagnosis not present

## 2019-08-15 DIAGNOSIS — L400 Psoriasis vulgaris: Secondary | ICD-10-CM | POA: Diagnosis not present

## 2019-08-22 DIAGNOSIS — L400 Psoriasis vulgaris: Secondary | ICD-10-CM | POA: Diagnosis not present

## 2019-08-26 ENCOUNTER — Ambulatory Visit: Payer: Medicare HMO | Attending: Internal Medicine

## 2019-08-26 DIAGNOSIS — Z23 Encounter for immunization: Secondary | ICD-10-CM

## 2019-08-26 NOTE — Progress Notes (Signed)
   Covid-19 Vaccination Clinic  Name:  Carrie Hall    MRN: [id number] DOB: 1933/12/28  08/26/2019  [name] was observed post Covid-19 immunization for 15 minutes without incident. She was provided with Vaccine Information Sheet and instruction to access the V-Safe system.   [name] was instructed to call 911 with any severe reactions post vaccine: Marland Kitchen Difficulty breathing  . Swelling of face and throat  . A fast heartbeat  . A bad rash all over body  . Dizziness and weakness   Immunizations Administered    Name Date Dose VIS Date Route   Pfizer COVID-19 Vaccine 08/26/2019 10:33 AM 0.3 mL 07/03/2018 Intramuscular   Manufacturer: [place]   Lot: [id number]   [place]: [id number]

## 2019-08-28 ENCOUNTER — Telehealth: Payer: Self-pay | Admitting: Family Medicine

## 2019-08-28 NOTE — Telephone Encounter (Signed)
Patient called me this morning and left a VM saying she needed to speak to me. I was just now able to return her call and left a VM asking her to call me back.

## 2019-08-28 NOTE — Telephone Encounter (Signed)
Patient wishes to change ALL medications to [employer] .

## 2019-08-28 NOTE — Telephone Encounter (Signed)
Was she just wanting to take [facility] out of her chart and was she needing refills? Please advise

## 2019-08-29 DIAGNOSIS — L400 Psoriasis vulgaris: Secondary | ICD-10-CM | POA: Diagnosis not present

## 2019-09-02 ENCOUNTER — Encounter: Payer: Self-pay | Admitting: Neurology

## 2019-09-02 ENCOUNTER — Other Ambulatory Visit: Payer: Self-pay

## 2019-09-02 ENCOUNTER — Ambulatory Visit (INDEPENDENT_AMBULATORY_CARE_PROVIDER_SITE_OTHER): Payer: Medicare HMO | Admitting: Neurology

## 2019-09-02 VITALS — BP 124/60 | HR 76 | Ht 64.0 in | Wt 149.2 lb

## 2019-09-02 DIAGNOSIS — R69 Illness, unspecified: Secondary | ICD-10-CM | POA: Diagnosis not present

## 2019-09-02 DIAGNOSIS — F03A0 Unspecified dementia, mild, without behavioral disturbance, psychotic disturbance, mood disturbance, and anxiety: Secondary | ICD-10-CM

## 2019-09-02 DIAGNOSIS — F0390 Unspecified dementia without behavioral disturbance: Secondary | ICD-10-CM

## 2019-09-02 MED ORDER — DONEPEZIL HCL 10 MG PO TABS: 10.0000 mg | ORAL_TABLET | Freq: Every day | ORAL | 3 refills | Status: DC

## 2019-09-02 NOTE — Patient Instructions (Signed)
Good seeing you! Continue Donepezil 10mg  daily. Follow-up in 6 months, call for any changes.   FALL PRECAUTIONS: Be cautious when walking. Scan the area for obstacles that may increase the risk of trips and falls. When getting up in the mornings, sit up at the edge of the bed for a few minutes before getting out of bed. Consider elevating the bed at the head end to avoid drop of blood pressure when getting up. Walk always in a well-lit room (use night lights in the walls). Avoid area rugs or power cords from appliances in the middle of the walkways. Use a walker or a cane if necessary and consider physical therapy for balance exercise. Get your eyesight checked regularly.   HOME SAFETY: Consider the safety of the kitchen when operating appliances like stoves, microwave oven, and blender. Consider having supervision and share cooking responsibilities until no longer able to participate in those. Accidents with firearms and other hazards in the house should be identified and addressed as well.  ABILITY TO BE LEFT ALONE: If patient is unable to contact 911 operator, consider using LifeLine, or when the need is there, arrange for someone to stay with patients. Smoking is a fire hazard, consider supervision or cessation. Risk of wandering should be assessed by caregiver and if detected at any point, supervision and safe proof recommendations should be instituted.  MEDICATION SUPERVISION: Inability to self-administer medication needs to be constantly addressed. Implement a mechanism to ensure safe administration of the medications.  RECOMMENDATIONS FOR ALL PATIENTS WITH MEMORY PROBLEMS: 1. Continue to exercise (Recommend 30 minutes of walking everyday, or 3 hours every week) 2. Increase social interactions - continue going to [place] and enjoy social gatherings with friends and family 3. Eat healthy, avoid fried foods and eat more fruits and vegetables 4. Maintain adequate blood pressure, blood sugar, and  blood cholesterol level. Reducing the risk of stroke and cardiovascular disease also helps promoting better memory. 5. Avoid stressful situations. Live a simple life and avoid aggravations. Organize your time and prepare for the next day in anticipation. 6. Sleep well, avoid any interruptions of sleep and avoid any distractions in the bedroom that may interfere with adequate sleep quality 7. Avoid sugar, avoid sweets as there is a strong link between excessive sugar intake, diabetes, and cognitive impairment The Mediterranean diet has been shown to help patients reduce the risk of progressive memory disorders and reduces cardiovascular risk. This includes eating fish, eat fruits and green leafy vegetables, nuts like almonds and hazelnuts, walnuts, and also use olive oil. Avoid fast foods and fried foods as much as possible. Avoid sweets and sugar as sugar use has been linked to worsening of memory function.  There is always a concern of gradual progression of memory problems. If this is the case, then we may need to adjust level of care according to patient needs. Support, both to the patient and caregiver, should then be put into place.

## 2019-09-02 NOTE — Progress Notes (Signed)
NEUROLOGY FOLLOW UP OFFICE NOTE  Carrie Hall [id number] 1933-09-02  HISTORY OF PRESENT ILLNESS: I had the pleasure of seeing Carrie Hall in follow-up in the neurology clinic on 09/02/2019.  The patient was last seen 84 months ago for dementia. She is again accompanied by her son who helps supplement the history today.  Records and images were personally reviewed where available.  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 She is on Donepezil 10mg  daily without side effects. Since her last visit, they both report she is doing pretty well. Her son states there are no issues. She continues to live alone, her sons visit daily. She continues to cook without leaving the stove on, she cooks dinner for her youngest son who lives across the street, he stays with her until bedtime. She continues to manage her own medications and denies missing frequently, her son reports she takes it well 99% of the time. She does not drive. Her son manages finances. No hygiene concerns, she is independent with dressing and bathing. No personality changes, no paranoia or hallucinations. Sleep is overall good. She denies any headaches, dizziness, vision changes, neck/back pain, focal numbness/tingling/weakness. She has occasional constipation and diarrhea. Her legs have been sore since her Covid-19 vaccine last week. No falls.   History on Initial Assessment 11/01/2017: This is an 86 year old right-handed woman with a history of hyperlipidemia, hypothyroidism, breast cancer, presenting for evaluation of worsening memory. When asked about her memory, she states she "forgets sometimes." Her daughter-in-law appears hesitant to provide additional information saying she does not want to hurt her feelings, but notes she repeats herself several times the same day. She lives alone, they report she cooks and cleans, no hygiene concerns. Her son has been managing bills for the past 5 years since the patient's husband passed away. She fills her own pillbox  and states she forgets a dose once in a while. Her family does not monitor medications so they cannot confirm this. She notes word-finding difficulties. She does not drive. She denies misplacing things frequently or leaving the stove on. She seems more anxious and has prn Xanax, but does not take this regularly. She took a dose last night and this morning nervous about today's visit. Her maternal grandmother, several cousins, and mother had Alzheimer's dementia. No history of significant head injuries or alcohol intake. She had an MMSE at PCP office in April 2017 which was 84/30 and was started on Donepezil but appears to have stopped it, she does not recall any side effects. Last MMSE at PCP office a few days ago was 84/30. She had an MRI brain without contrast in 08/2015 which I personally reviewed, no acute changes, there was mild to moderate chronic microvascular disease and mild diffuse atrophy.  She denies any headaches, dizziness, vision changes, dysarthria/dysphagia, neck/back pain, focal numbness/tingling/weakness. She has constipation but would get diarrhea with spicy foods.    PAST MEDICAL HISTORY: Past Medical History:  Diagnosis Date  . Adenomatous colon polyp   . Breast cancer ([place])   . COPD (chronic obstructive pulmonary disease) ([place])   . Diverticulosis   . Hyperlipidemia   . Internal hemorrhoids   . Osteopenia   . Thyroid disease     MEDICATIONS: Current Outpatient Medications on File Prior to Visit  Medication Sig Dispense Refill  . albuterol (VENTOLIN HFA) 108 (90 Base) MCG/ACT inhaler Inhale 2 puffs into the lungs every 4 (four) hours as needed for wheezing or shortness of breath. 54 g 1  .  alendronate (FOSAMAX) 70 MG tablet Take 1 tablet (70 mg total) by mouth every 7 (seven) days. Take with a full glass of water on an empty stomach. 12 tablet 3  . ALPRAZolam (XANAX) 0.25 MG tablet Take 1 tablet (0.25 mg total) by mouth at bedtime as needed for anxiety. 30 tablet 0  .  atorvastatin (LIPITOR) 10 MG tablet Take 1 tablet by mouth once daily 90 tablet 1  . ciprofloxacin (CIPRO) 250 MG tablet Take 1 tablet (250 mg total) by mouth 2 (two) times daily. (Patient not taking: Reported on 05/07/2019) 6 tablet 0  . donepezil (ARICEPT) 10 MG tablet Take 1 tablet (10 mg total) by mouth at bedtime. 90 tablet 3  . EPINEPHrine 0.3 mg/0.3 mL IJ SOAJ injection INJECT CONTENTS OF 1 PEN AS NEEDED FOR ALLERGIC REACTION    . fluticasone-salmeterol (ADVAIR HFA) 115-21 MCG/ACT inhaler Inhale 2 puffs into the lungs 2 (two) times daily. 1 Inhaler 12  . folic acid (FOLVITE) 1 MG tablet Take 1 mg by mouth daily.    Marland Kitchen levothyroxine (SYNTHROID) 88 MCG tablet Take 1 tablet (88 mcg total) by mouth daily before breakfast. 90 tablet 1  . ranitidine (ZANTAC) 150 MG capsule Take 1 capsule (150 mg total) by mouth 2 (two) times daily for 5 days. 10 capsule 0   No current facility-administered medications on file prior to visit.    ALLERGIES: Allergies  Allergen Reactions  . Pneumococcal Vaccines     FAMILY HISTORY: Family History  Problem Relation Age of Onset  . Alzheimer's disease Mother   . Cancer Father        lung  . Alzheimer's disease Maternal Grandmother   . Rectal cancer Sister        in her 13s    SOCIAL HISTORY: Social History   Socioeconomic History  . Marital status: Widowed    Spouse name: Not on file  . Number of children: Not on file  . Years of education: Not on file  . Highest education level: Not on file  Occupational History  . Not on file  Tobacco Use  . Smoking status: Former Smoker    Packs/day: 0.50    Types: Cigarettes    Quit date: 12/06/2003    Years since quitting: 15.7  . Smokeless tobacco: Never Used  Substance and Sexual Activity  . Alcohol use: No  . Drug use: No  . Sexual activity: Never    Partners: Male  Other Topics Concern  . 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 Difficulty of Paying Living Expenses:   Food Insecurity:   . Worried About Charity fundraiser in the Last Year:   . Arboriculturist in the Last Year:   Transportation Needs:   . Film/video editor (Medical):   Marland Kitchen Lack of Transportation (Non-Medical):   Physical Activity:   . Days of Exercise per Week:   . Minutes of Exercise per Session:   Stress:   . Feeling of Stress :   Social Connections:   . Frequency of Communication with Friends and Family:   . Frequency of Social Gatherings with Friends and Family:   . Attends Religious Services:   . Active Member of Clubs  or Organizations:   . Attends Archivist Meetings:   Marland Kitchen Marital Status:   Intimate Partner Violence:   . Fear of Current or Ex-Partner:   . Emotionally Abused:   Marland Kitchen Physically Abused:   . Sexually Abused:     REVIEW OF SYSTEMS: Constitutional: No fevers, chills, or sweats, no generalized fatigue, change in appetite Eyes: No visual changes, double vision, eye pain Ear, nose and throat: No hearing loss, ear pain, nasal congestion, sore throat Cardiovascular: No chest pain, palpitations Respiratory:  No shortness of breath at rest or with exertion, wheezes GastrointestinaI: No nausea, vomiting, diarrhea, abdominal pain, fecal incontinence Genitourinary:  No dysuria, urinary retention or frequency Musculoskeletal:  No neck pain, back pain Integumentary: No rash, pruritus, skin lesions Neurological: as above Psychiatric: No depression, insomnia, anxiety Endocrine: No palpitations, fatigue, diaphoresis, mood swings, change in appetite, change in weight, increased thirst Hematologic/Lymphatic:  No anemia, purpura, petechiae. Allergic/Immunologic: no itchy/runny eyes, nasal congestion, recent allergic reactions, rashes  PHYSICAL EXAM: Vitals:   09/02/19 0826  BP: 124/60    Pulse: 76  SpO2: 96%   General: No acute distress Head:  Normocephalic/atraumatic Skin/Extremities: No rash, no edema Neurological Exam: alert and oriented to person, place, and time. No aphasia or dysarthria. Fund of knowledge is appropriate.  Recent and remote memory are intact.  Attention and concentration are reduced, unable to do serial 7s or spell WORLD backward (can spell WORLD).  Able to name objects and repeat phrases. MMSE 20/30 MMSE - Mini Mental State Exam 09/02/2019 11/01/2017 10/27/2017  Orientation to time 3 5 4   Orientation to Place 4 4 5   Registration 3 3 3   Attention/ Calculation 0 3 3  Recall 3 1 1   Language- name 2 objects 2 2 2   Language- repeat 1 1 1   Language- follow 3 step command 2 3 3   Language- read & follow direction 1 1 1   Write a sentence 0 1 1  Copy design 1 1 1   Total score 20 25 25    Cranial nerves: Pupils equal, round, reactive to light.  Extraocular movements intact with no nystagmus. Visual fields full. No facial asymmetry. Motor: Bulk and tone normal, muscle strength 5/5 throughout with no pronator drift. Deep tendon reflexes 2+ throughout, toes downgoing.  Finger to nose testing intact.  Gait narrow-based and steady, no ataxia.  IMPRESSION: This is an 86 yo RH woman with a history of hyperlipidemia, hypothyroidism, breast cancer, with mild dementia. MMSE today 20/30 ([place] 14/30 in February 2020, 25/30 in June 2019). She is doing well overall, symptoms stable per family, no behavioral or sleep issues. Continue Donepezil 10mg  daily. Continue close supervision. She does not drive. Follow-up in 6 months, they know to call for any changes.   Thank you for allowing me to participate in her care.  Please do not hesitate to call for any questions or concerns.   Ellouise Newer, M.D.   CC: Dr. Cheri Rous

## 2019-09-05 DIAGNOSIS — L400 Psoriasis vulgaris: Secondary | ICD-10-CM | POA: Diagnosis not present

## 2019-09-10 ENCOUNTER — Other Ambulatory Visit: Payer: Self-pay

## 2019-09-10 MED ORDER — ATORVASTATIN CALCIUM 10 MG PO TABS: 10.0000 mg | ORAL_TABLET | Freq: Every day | ORAL | 1 refills | Status: DC

## 2019-09-16 DIAGNOSIS — L400 Psoriasis vulgaris: Secondary | ICD-10-CM | POA: Diagnosis not present

## 2019-09-19 DIAGNOSIS — L400 Psoriasis vulgaris: Secondary | ICD-10-CM | POA: Diagnosis not present

## 2019-09-26 DIAGNOSIS — L400 Psoriasis vulgaris: Secondary | ICD-10-CM | POA: Diagnosis not present

## 2019-10-03 DIAGNOSIS — L400 Psoriasis vulgaris: Secondary | ICD-10-CM | POA: Diagnosis not present

## 2019-10-10 DIAGNOSIS — L400 Psoriasis vulgaris: Secondary | ICD-10-CM | POA: Diagnosis not present

## 2019-10-14 ENCOUNTER — Other Ambulatory Visit: Payer: Self-pay

## 2019-10-14 DIAGNOSIS — M8580 Other specified disorders of bone density and structure, unspecified site: Secondary | ICD-10-CM

## 2019-10-14 DIAGNOSIS — J410 Simple chronic bronchitis: Secondary | ICD-10-CM

## 2019-10-14 DIAGNOSIS — E039 Hypothyroidism, unspecified: Secondary | ICD-10-CM

## 2019-10-14 NOTE — Telephone Encounter (Signed)
Patient called in to see if she can get a prescription refill for  alendronate (FOSAMAX) 70 MG tablet [[id number]]    levothyroxine (SYNTHROID) 88 MCG tablet [[id number]]   albuterol (VENTOLIN HFA) 108 (90 Base) MCG/ACT inhaler [[id number]]   alendronate (FOSAMAX) 70 MG tablet [[id number]]    Please send it to [facility] - [place], [place] - [address]  [address], [place] [address]  Phone:  [phone] Fax:  [phone]  DEA #:  --

## 2019-10-15 ENCOUNTER — Ambulatory Visit: Payer: Self-pay | Admitting: Pharmacist

## 2019-10-15 MED ORDER — LEVOTHYROXINE SODIUM 88 MCG PO TABS: 88.0000 ug | ORAL_TABLET | Freq: Every day | ORAL | 1 refills | Status: DC

## 2019-10-15 MED ORDER — ALENDRONATE SODIUM 70 MG PO TABS: 70.0000 mg | ORAL_TABLET | ORAL | 3 refills | Status: DC

## 2019-10-15 MED ORDER — ALBUTEROL SULFATE HFA 108 (90 BASE) MCG/ACT IN AERS: 2.0000 | INHALATION_SPRAY | RESPIRATORY_TRACT | 1 refills | Status: DC | PRN

## 2019-10-15 NOTE — Chronic Care Management (AMB) (Signed)
Patient states she is happy to stay with [facility]. She appreciates that medications can be delivered to her home since she does not drive.   Reviewed chart for medication changes ahead of medication coordination call.  OVs, Consults, or hospital visits since last care coordination call/Pharmacist visit.   09/02/19: Neurology visit w/ Dr. Delice Lesch - No med changes noted. Continue donepezil 10mg  daily.  No medication changes indicated OR if recent visit, treatment plan here.  BP Readings from Last 3 Encounters:  09/02/19 124/60  05/07/19 118/80  03/11/19 130/80    Lab Results  Component Value Date   HGBA1C 6.1 07/20/2012     Patient obtains medications through Adherence Packaging  90 Days   Patient is due for next adherence delivery on: 10/17/19. Called patient and reviewed medications and coordinated delivery.  This delivery to include: Alendronate 70mg  Every Monday- Before Breakfast  Levothyroxine 83mcg daily- Before Breakfast  Folic Acid 1mg  daily- Breakfast  Atorvastatin 10mg  daily- Bedtime  Donepezil 10mg  - Bedtime  Albuterol Aer Hfa  Advair Hfa Aer 115/21  Alprazolam 0.5mg :  the patient stated that she has enough medication to last her for 2 months or more  Methotrexate: Has enough for 2 weeks or more. Will notify pharmacy team when she needs refills. She is going for injection on Thursday 10/17/19.   Confirmed delivery date of 10/17/19, advised patient that pharmacy will contact them the morning of delivery.

## 2019-10-15 NOTE — Telephone Encounter (Signed)
Medications refilled to pharmacy.

## 2019-10-17 DIAGNOSIS — L400 Psoriasis vulgaris: Secondary | ICD-10-CM | POA: Diagnosis not present

## 2019-10-23 DIAGNOSIS — H353132 Nonexudative age-related macular degeneration, bilateral, intermediate dry stage: Secondary | ICD-10-CM | POA: Diagnosis not present

## 2019-10-23 DIAGNOSIS — Z961 Presence of intraocular lens: Secondary | ICD-10-CM | POA: Diagnosis not present

## 2019-10-23 DIAGNOSIS — H35373 Puckering of macula, bilateral: Secondary | ICD-10-CM | POA: Diagnosis not present

## 2019-10-24 DIAGNOSIS — L400 Psoriasis vulgaris: Secondary | ICD-10-CM | POA: Diagnosis not present

## 2019-10-31 DIAGNOSIS — L408 Other psoriasis: Secondary | ICD-10-CM | POA: Diagnosis not present

## 2019-11-07 ENCOUNTER — Ambulatory Visit: Payer: Medicare HMO | Admitting: Family Medicine

## 2019-11-07 DIAGNOSIS — L408 Other psoriasis: Secondary | ICD-10-CM | POA: Diagnosis not present

## 2019-11-13 ENCOUNTER — Encounter: Payer: Self-pay | Admitting: *Deleted

## 2019-11-13 ENCOUNTER — Ambulatory Visit (INDEPENDENT_AMBULATORY_CARE_PROVIDER_SITE_OTHER): Payer: Medicare HMO | Admitting: *Deleted

## 2019-11-13 ENCOUNTER — Other Ambulatory Visit: Payer: Self-pay

## 2019-11-13 DIAGNOSIS — L400 Psoriasis vulgaris: Secondary | ICD-10-CM | POA: Diagnosis not present

## 2019-11-13 DIAGNOSIS — L408 Other psoriasis: Secondary | ICD-10-CM | POA: Diagnosis not present

## 2019-11-13 DIAGNOSIS — Z0000 Encounter for general adult medical examination without abnormal findings: Secondary | ICD-10-CM | POA: Diagnosis not present

## 2019-11-13 NOTE — Progress Notes (Signed)
I connected with Shenekia today by telephone and verified that I am speaking with the correct person using two identifiers. Location patient: home Location provider: work Persons participating in the virtual visit: patient, Marine scientist.    I discussed the limitations, risks, security and privacy concerns of performing an evaluation and management service by telephone and the availability of in person appointments. I also discussed with the patient that there may be a patient responsible charge related to this service. The patient expressed understanding and verbally consented to this telephonic visit.    Interactive audio and video telecommunications were attempted between this provider and patient, however failed, due to patient having technical difficulties OR patient did not have access to video capability.  We continued and completed visit with audio only.  Some vital signs may be absent or patient reported.    Subjective:   Carrie Hall is a 86 y.o. female who presents for Medicare Annual (Subsequent) preventive examination.  Review of Systems     Cardiac Risk Factors include: advanced age (>23men, >58 women);dyslipidemia;hypertension;obesity (BMI >30kg/m2);sedentary lifestyle     Objective:      Advanced Directives 11/13/2019 09/02/2019 01/29/2019 10/29/2018 02/12/2018 10/27/2017 07/15/2016  Does Patient Have a Medical Advance Directive? No Yes No No No No No  Type of Advance Directive - Healthcare Power of [place];Living will - - - - -  Would patient like information on creating a medical advance directive? No - Patient declined - No - Patient declined No - Patient declined - Yes (MAU/Ambulatory/Procedural Areas - Information given) No - Patient declined    Current Medications (verified) Outpatient Encounter Medications as of 11/13/2019  Medication Sig  . albuterol (VENTOLIN HFA) 108 (90 Base) MCG/ACT inhaler Inhale 2 puffs into the lungs every 4 (four) hours as needed for wheezing or  shortness of breath.  Marland Kitchen alendronate (FOSAMAX) 70 MG tablet Take 1 tablet (70 mg total) by mouth every 7 (seven) days. Take with a full glass of water on an empty stomach.  . ALPRAZolam (XANAX) 0.25 MG tablet Take 1 tablet (0.25 mg total) by mouth at bedtime as needed for anxiety.  Marland Kitchen atorvastatin (LIPITOR) 10 MG tablet Take 1 tablet (10 mg total) by mouth daily.  Marland Kitchen donepezil (ARICEPT) 10 MG tablet Take 1 tablet (10 mg total) by mouth at bedtime.  . fluticasone-salmeterol (ADVAIR HFA) 115-21 MCG/ACT inhaler Inhale 2 puffs into the lungs 2 (two) times daily.  . folic acid (FOLVITE) 1 MG tablet Take 1 mg by mouth daily.  Marland Kitchen levothyroxine (SYNTHROID) 88 MCG tablet Take 1 tablet (88 mcg total) by mouth daily before breakfast.  . methotrexate 50 MG/2ML injection SMARTSIG:0.6 Milliliter(s) IM Once a Week   No facility-administered encounter medications on file as of 11/13/2019.    Allergies (verified) Pneumococcal vaccines   History: Past Medical History:  Diagnosis Date  . Adenomatous colon polyp   . Breast cancer ([place])   . COPD (chronic obstructive pulmonary disease) ([place])   . Diverticulosis   . Hyperlipidemia   . Internal hemorrhoids   . Osteopenia   . Thyroid disease    Past Surgical History:  Procedure Laterality Date  . BREAST LUMPECTOMY Left    radiation only  . BREAST SURGERY    . colon polyps     Family History  Problem Relation Age of Onset  . Alzheimer's disease Mother   . Cancer Father        lung  . Alzheimer's disease Maternal Grandmother   . Rectal cancer  Sister        in her 20s   Social History   Socioeconomic History  . Marital status: Widowed    Spouse name: Not on file  . Number of children: Not on file  . Years of education: Not on file  . Highest education level: Not on file  Occupational History  . Not on file  Tobacco Use  . Smoking status: Former Smoker    Packs/day: 0.50    Types: Cigarettes    Quit date: 12/06/2003    Years since quitting:  15.9  . Smokeless tobacco: Never Used  Substance and Sexual Activity  . Alcohol use: No  . Drug use: No  . Sexual activity: Never    Partners: Male  Other Topics Concern  . 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 Difficulty of Paying Living Expenses: Not hard at all  Food Insecurity: No Food Insecurity  . Worried About Charity fundraiser in the Last Year: Never true  . Ran Out of Food in the Last Year: Never true  Transportation Needs: No Transportation Needs  . Lack of Transportation (Medical): No  . Lack of Transportation (Non-Medical): No  Physical Activity:   . Days of Exercise per Week:   . Minutes of Exercise per Session:   Stress:   . Feeling of Stress :   Social Connections:   . Frequency of Communication with Friends and Family:   . Frequency of Social Gatherings with Friends and Family:   . Attends Religious Services:   . Active Member of Clubs or Organizations:   . Attends Archivist Meetings:   Marland Kitchen Marital Status:     Tobacco Counseling Counseling given: Not Answered   Clinical Intake: Pain : No/denies pain    Activities of Daily Living In your present state of health, do you have any difficulty performing the following activities: 11/13/2019 05/07/2019  Hearing? N Y  Vision? N N  Difficulty concentrating or making decisions? Tempie Donning  Walking or climbing stairs? N N  Dressing or bathing? N N  Doing errands, shopping? Y N  Preparing Food and eating ? N -  Using the Toilet? N -  In the past six months, have you accidently leaked urine? N -  Do you have problems with loss of bowel control? Y -  Managing your Medications? N -  Managing your Finances? Y -  Housekeeping or managing your Housekeeping? N -  Some recent data might be hidden     Patient Care Team: Carollee Herter, Alferd Apa, DO as PCP - General Delice Lesch Lezlie Octave, MD as Consulting Physician (Neurology) Day, Melvenia Beam, [facility] as Pharmacist (Pharmacist)  Indicate any recent Medical Services you may have received from other than [facility] providers in the past year (date may be approximate).     Assessment:   This is a routine wellness examination for L[place].  Dietary issues and exercise activities discussed: Current Exercise Habits: The patient does not participate in regular exercise at present, Exercise limited by: None identified Diet (meal preparation, eat out, water intake, caffeinated beverages, dairy products, fruits and vegetables): well balanced   Goals    .  Consider getting a repeat DEXA scan  This is a scan of your bones to assess your bone strength    .  Consider restarting alendronate 70mg  once weekly pending Dr. Etter Sjogren approval      This medication will help with your bones    .  maintain independence and current health (pt-stated)    .  Pharmacy Care Plan      CARE PLAN ENTRY  Current Barriers:  . Chronic Disease Management support, education, and care coordination needs related to COPD, HTN, HLD, Hypothyroidism, Anxiety, Osteoporosis, Memory Loss, Psorasis  Pharmacist Clinical Goal(s):  Marland Kitchen Over the next 120 days, patient will demonstrate Improved medication adherence as evidenced by appropriate fill dates with assistance from adherence packaging . Patient will take her medications as prescribed . Complete DEXA Scan  Interventions: . Comprehensive medication review performed. Marland Kitchen Collaboration with provider regarding medication management (alendronate)  Patient Self Care Activities:  . Patient verbalizes understanding of plan to follow as described above, Self administers medications as prescribed, Calls pharmacy for medication refills, and Calls provider office for new concerns or questions  Initial goal documentation     .  Remember to take  your medications as prescribed      Once your medications are placed into packaging, this will help you to remember your medications. I will or a member of the pharmacy team will also start calling you monthly to see how things are going.      Depression Screen PHQ 2/9 Scores 05/07/2019 10/29/2018 10/27/2017 08/07/2017 01/28/2016 07/24/2015 03/07/2014  PHQ - 2 Score 0 0 0 0 0 0 0    Fall Risk Fall Risk  11/13/2019 09/02/2019 01/29/2019 10/29/2018 06/22/2018  Falls in the past year? 0 0 0 0 0  Number falls in past yr: 0 0 0 - 0  Injury with Fall? 0 0 0 - 0  Follow up Education provided;Falls prevention discussed - - - -   Lives alone in 1 story home. Sons lives close by.  Any stairs in or around the home? No  If so, are there any without handrails? No  Home free of loose throw rugs in walkways, pet beds, electrical cords, etc? Yes  Adequate lighting in your home to reduce risk of falls? Yes   ASSISTIVE DEVICES UTILIZED TO PREVENT FALLS:  Life alert? No  Use of a cane, walker or w/c? No  Grab bars in the bathroom? No  Shower chair or bench in shower? No  Elevated toilet seat or a handicapped toilet? No     Cognitive Function:   MMSE - Mini Mental State Exam 09/02/2019 11/01/2017 10/27/2017  Orientation to time 3 5 4   Orientation to Place 4 4 5   Registration 3 3 3   Attention/ Calculation 0 3 3  Recall 3 1 1   Language- name 2 objects 2 2 2   Language- repeat 1 1 1   Language- follow 3 step command 2 3 3   Language- read & follow direction 1 1 1   Write a sentence 0 1 1  Copy design 1 1 1   Total score 20 25 25    Montreal Cognitive Assessment  01/31/2019 06/22/2018  Visuospatial/ Executive (0/5) - 3  Naming (0/3) - 3  Attention: Read list of digits (0/2) 1 1  Attention: Read list of letters (0/1) 0 0  Attention: Serial 7 subtraction starting at 100 (0/3) 0 0  Language: Repeat phrase (0/2) 1 0  Language : Fluency (0/1) 0 0  Abstraction (0/2) 0 0  Delayed Recall (0/5) 3 0  Orientation  (  0/6) 4 6  Total - 13  Adjusted Score (based on education) - 14   6CIT Screen 11/13/2019  What Year? 0 points  What month? 0 points  What time? 0 points  Count back from 20 0 points  Months in reverse (No Data)  Repeat phrase 0 points    Immunizations Immunization History  Administered Date(s) Administered  . Fluad Quad(high Dose 65+) 03/11/2019  . Influenza Split 03/15/2011  . Influenza Whole 03/21/2007, 02/04/2008, 02/25/2009, 03/11/2010, 02/07/2012  . Influenza, High Dose Seasonal PF 03/07/2014, 01/28/2016, 02/22/2017, 01/13/2018  . Influenza,inj,Quad PF,6+ Mos 02/14/2013, 02/26/2015  . PFIZER SARS-COV-2 Vaccination 08/01/2019, 08/26/2019  . Pneumococcal Conjugate-13 03/07/2014  . Pneumococcal Polysaccharide-23 05/15/2006, 12/11/2007, 02/12/2018  . Td 02/24/2004  . Tdap 08/07/2017  . Zoster 11/02/2009    TDAP status: Up to date Flu Vaccine status: Up to date Pneumococcal vaccine status: Up to date Covid-19 vaccine status: Completed vaccines  Qualifies for Shingles Vaccine?   Zostavax completed Yes     Screening Tests Health Maintenance  Topic Date Due  . MAMMOGRAM  03/13/2018  . INFLUENZA VACCINE  12/08/2019  . TETANUS/TDAP  08/08/2027  . DEXA SCAN  Completed  . COVID-19 Vaccine  Completed  . PNA vac Low Risk Adult  Completed    Health Maintenance  Health Maintenance Due  Topic Date Due  . MAMMOGRAM  03/13/2018    Colorectal cancer screening: No longer required.  Bone Density status: Completed 10/06/17. Results reflect: Bone density results: OSTEOPOROSIS. Repeat every 2 years.  Pt states she will think about another mammogram.  Lung Cancer Screening: (Low Dose CT Chest recommended if Age 76-80 years, 30 pack-year currently smoking OR have quit w/in 15years.) does not qualify.    Additional Screening:  Hepatitis C Screening: does qualify; Completed 10/04/17  Vision Screening: Recommended annual ophthalmology exams for early detection of glaucoma and  other disorders of the eye. Is the patient up to date with their annual eye exam?  Yes  Who is the provider or what is the name of the office in which the patient attends annual eye exams? Dr.Wood.   Dental Screening: Recommended annual dental exams for proper oral hygiene  Community Resource Referral / Chronic Care Management: CRR required this visit?  No   CCM required this visit?  No      Plan:    Please schedule your next medicare wellness visit with me in 1 yr.  Continue to eat heart healthy diet (full of fruits, vegetables, whole grains, lean protein, water--limit salt, fat, and sugar intake) and increase physical activity as tolerated.  Continue doing brain stimulating activities (puzzles, reading, adult coloring books, staying active) to keep memory sharp.    I have personally reviewed and noted the following in the patient's chart:   . Medical and social history . Use of alcohol, tobacco or illicit drugs  . Current medications and supplements . Functional ability and status . Nutritional status . Physical activity . Advanced directives . List of other physicians . Hospitalizations, surgeries, and ER visits in previous 12 months . Vitals . Screenings to include cognitive, depression, and falls . Referrals and appointments  In addition, I have reviewed and discussed with patient certain preventive protocols, quality metrics, and best practice recommendations. A written personalized care plan for preventive services as well as general preventive health recommendations were provided to patient.   Due to this being a telephonic visit, the after visit summary with patients personalized plan was offered to patient via mail  or my-chart.Patient would like to access on my-chart   Shela Nevin, [place]   11/13/2019

## 2019-11-13 NOTE — Patient Instructions (Signed)
Carrie Hall , Thank you for taking time to come for your Medicare Wellness Visit. I appreciate your ongoing commitment to your health goals. Please review the following plan we discussed and let me know if I can assist you in the future.   Screening recommendations/referrals: Colorectal cancer screening: No longer required.  Bone Density status: Completed 10/06/17. Results reflect: Bone density results: OSTEOPOROSIS. Repeat every 2 years.  Pt states she will think about another mammogram. Recommended yearly ophthalmology/optometry visit for glaucoma screening and checkup Recommended yearly dental visit for hygiene and checkup  Vaccinations: TDAP status: Up to date Flu Vaccine status: Up to date Pneumococcal vaccine status: Up to date Covid-19 vaccine status: Completed vaccines  Advanced directives: Bring a copy of your living will and/or healthcare power of attorney to your next office visit.   Next appointment: Follow up in one year for your annual wellness visit    Preventive Care 65 Years and Older, Female Preventive care refers to lifestyle choices and visits with your health care provider that can promote health and wellness. What does preventive care include?  A yearly physical exam. This is also called an annual well check.  Dental exams once or twice a year.  Routine eye exams. Ask your health care provider how often you should have your eyes checked.  Personal lifestyle choices, including:  Daily care of your teeth and gums.  Regular physical activity.  Eating a healthy diet.  Avoiding tobacco and drug use.  Limiting alcohol use.  Practicing safe sex.  Taking low-dose aspirin every day.  Taking vitamin and mineral supplements as recommended by your health care provider. What happens during an annual well check? The services and screenings done by your health care provider during your annual well check will depend on your age, overall health, lifestyle risk  factors, and family history of disease. Counseling  Your health care provider may ask you questions about your:  Alcohol use.  Tobacco use.  Drug use.  Emotional well-being.  Home and relationship well-being.  Sexual activity.  Eating habits.  History of falls.  Memory and ability to understand (cognition).  Work and work Statistician.  Reproductive health. Screening  You may have the following tests or measurements:  Height, weight, and BMI.  Blood pressure.  Lipid and cholesterol levels. These may be checked every 5 years, or more frequently if you are over 51 years old.  Skin check.  Lung cancer screening. You may have this screening every year starting at age 56 if you have a 30-pack-year history of smoking and currently smoke or have quit within the past 15 years.  Fecal occult blood test (FOBT) of the stool. You may have this test every year starting at age 53.  Flexible sigmoidoscopy or colonoscopy. You may have a sigmoidoscopy every 5 years or a colonoscopy every 10 years starting at age 20.  Hepatitis C blood test.  Hepatitis B blood test.  Sexually transmitted disease (STD) testing.  Diabetes screening. This is done by checking your blood sugar (glucose) after you have not eaten for a while (fasting). You may have this done every 1-3 years.  Bone density scan. This is done to screen for osteoporosis. You may have this done starting at age 39.  Mammogram. This may be done every 1-2 years. Talk to your health care provider about how often you should have regular mammograms. Talk with your health care provider about your test results, treatment options, and if necessary, the need for more tests.  Vaccines  Your health care provider may recommend certain vaccines, such as:  Influenza vaccine. This is recommended every year.  Tetanus, diphtheria, and acellular pertussis (Tdap, Td) vaccine. You may need a Td booster every 10 years.  Zoster vaccine. You  may need this after age 57.  Pneumococcal 13-valent conjugate (PCV13) vaccine. One dose is recommended after age 95.  Pneumococcal polysaccharide (PPSV23) vaccine. One dose is recommended after age 60. Talk to your health care provider about which screenings and vaccines you need and how often you need them. This information is not intended to replace advice given to you by your health care provider. Make sure you discuss any questions you have with your health care provider. Document Released: 05/22/2015 Document Revised: 01/13/2016 Document Reviewed: 02/24/2015 Elsevier Interactive Patient Education  2017 [place] Prevention in the Home Falls can cause injuries. They can happen to people of all ages. There are many things you can do to make your home safe and to help prevent falls. What can I do on the outside of my home?  Regularly fix the edges of walkways and driveways and fix any cracks.  Remove anything that might make you trip as you walk through a door, such as a raised step or threshold.  Trim any bushes or trees on the path to your home.  Use bright outdoor lighting.  Clear any walking paths of anything that might make someone trip, such as rocks or tools.  Regularly check to see if handrails are loose or broken. Make sure that both sides of any steps have handrails.  Any raised decks and porches should have guardrails on the edges.  Have any leaves, snow, or ice cleared regularly.  Use sand or salt on walking paths during winter.  Clean up any spills in your garage right away. This includes oil or grease spills. What can I do in the bathroom?  Use night lights.  Install grab bars by the toilet and in the tub and shower. Do not use towel bars as grab bars.  Use non-skid mats or decals in the tub or shower.  If you need to sit down in the shower, use a plastic, non-slip stool.  Keep the floor dry. Clean up any water that spills on the floor as soon as  it happens.  Remove soap buildup in the tub or shower regularly.  Attach bath mats securely with double-sided non-slip rug tape.  Do not have throw rugs and other things on the floor that can make you trip. What can I do in the bedroom?  Use night lights.  Make sure that you have a light by your bed that is easy to reach.  Do not use any sheets or blankets that are too big for your bed. They should not hang down onto the floor.  Have a firm chair that has side arms. You can use this for support while you get dressed.  Do not have throw rugs and other things on the floor that can make you trip. What can I do in the kitchen?  Clean up any spills right away.  Avoid walking on wet floors.  Keep items that you use a lot in easy-to-reach places.  If you need to reach something above you, use a strong step stool that has a grab bar.  Keep electrical cords out of the way.  Do not use floor polish or wax that makes floors slippery. If you must use wax, use non-skid floor wax.  Do not have throw rugs and other things on the floor that can make you trip. What can I do with my stairs?  Do not leave any items on the stairs.  Make sure that there are handrails on both sides of the stairs and use them. Fix handrails that are broken or loose. Make sure that handrails are as long as the stairways.  Check any carpeting to make sure that it is firmly attached to the stairs. Fix any carpet that is loose or worn.  Avoid having throw rugs at the top or bottom of the stairs. If you do have throw rugs, attach them to the floor with carpet tape.  Make sure that you have a light switch at the top of the stairs and the bottom of the stairs. If you do not have them, ask someone to add them for you. What else can I do to help prevent falls?  Wear shoes that:  Do not have high heels.  Have rubber bottoms.  Are comfortable and fit you well.  Are closed at the toe. Do not wear sandals.  If  you use a stepladder:  Make sure that it is fully opened. Do not climb a closed stepladder.  Make sure that both sides of the stepladder are locked into place.  Ask someone to hold it for you, if possible.  Clearly mark and make sure that you can see:  Any grab bars or handrails.  First and last steps.  Where the edge of each step is.  Use tools that help you move around (mobility aids) if they are needed. These include:  Canes.  Walkers.  Scooters.  Crutches.  Turn on the lights when you go into a dark area. Replace any light bulbs as soon as they burn out.  Set up your furniture so you have a clear path. Avoid moving your furniture around.  If any of your floors are uneven, fix them.  If there are any pets around you, be aware of where they are.  Review your medicines with your doctor. Some medicines can make you feel dizzy. This can increase your chance of falling. Ask your doctor what other things that you can do to help prevent falls. This information is not intended to replace advice given to you by your health care provider. Make sure you discuss any questions you have with your health care provider. Document Released: 02/19/2009 Document Revised: 10/01/2015 Document Reviewed: 05/30/2014 Elsevier Interactive Patient Education  2017 [employer].

## 2019-11-18 ENCOUNTER — Other Ambulatory Visit: Payer: Self-pay

## 2019-11-18 ENCOUNTER — Encounter: Payer: Self-pay | Admitting: Family Medicine

## 2019-11-18 ENCOUNTER — Ambulatory Visit (INDEPENDENT_AMBULATORY_CARE_PROVIDER_SITE_OTHER): Payer: Medicare HMO | Admitting: Family Medicine

## 2019-11-18 VITALS — BP 138/68 | HR 57 | Temp 97.1°F | Resp 18 | Ht 64.0 in | Wt 148.0 lb

## 2019-11-18 DIAGNOSIS — L409 Psoriasis, unspecified: Secondary | ICD-10-CM | POA: Insufficient documentation

## 2019-11-18 DIAGNOSIS — J449 Chronic obstructive pulmonary disease, unspecified: Secondary | ICD-10-CM

## 2019-11-18 DIAGNOSIS — E785 Hyperlipidemia, unspecified: Secondary | ICD-10-CM

## 2019-11-18 DIAGNOSIS — E039 Hypothyroidism, unspecified: Secondary | ICD-10-CM

## 2019-11-18 DIAGNOSIS — F411 Generalized anxiety disorder: Secondary | ICD-10-CM

## 2019-11-18 DIAGNOSIS — G3184 Mild cognitive impairment, so stated: Secondary | ICD-10-CM

## 2019-11-18 DIAGNOSIS — I10 Essential (primary) hypertension: Secondary | ICD-10-CM | POA: Diagnosis not present

## 2019-11-18 DIAGNOSIS — Z79899 Other long term (current) drug therapy: Secondary | ICD-10-CM

## 2019-11-18 DIAGNOSIS — R195 Other fecal abnormalities: Secondary | ICD-10-CM | POA: Diagnosis not present

## 2019-11-18 DIAGNOSIS — Z408 Encounter for other prophylactic surgery: Secondary | ICD-10-CM | POA: Diagnosis not present

## 2019-11-18 DIAGNOSIS — R69 Illness, unspecified: Secondary | ICD-10-CM | POA: Diagnosis not present

## 2019-11-18 LAB — COMPREHENSIVE METABOLIC PANEL
ALT: 11 U/L (ref 0–35)
AST: 14 U/L (ref 0–37)
Albumin: 4.3 g/dL (ref 3.5–5.2)
Alkaline Phosphatase: 68 U/L (ref 39–117)
BUN: 11 mg/dL (ref 6–23)
CO2: 34 mEq/L — ABNORMAL HIGH (ref 19–32)
Calcium: 9.4 mg/dL (ref 8.4–10.5)
Chloride: 100 mEq/L (ref 96–112)
Creatinine, Ser: 0.66 mg/dL (ref 0.40–1.20)
GFR: 85.34 mL/min (ref >60.00)
Glucose, Bld: 102 mg/dL — ABNORMAL HIGH (ref 70–99)
Potassium: 4.4 mEq/L (ref 3.5–5.1)
Sodium: 140 mEq/L (ref 135–145)
Total Bilirubin: 0.7 mg/dL (ref 0.2–1.2)
Total Protein: 6.3 g/dL (ref 6.0–8.3)

## 2019-11-18 LAB — CBC WITH DIFFERENTIAL/PLATELET
Basophils Absolute: 0 10*3/uL (ref 0.0–0.1)
Basophils Relative: 0.8 % (ref 0.0–3.0)
Eosinophils Absolute: 0.1 10*3/uL (ref 0.0–0.7)
Eosinophils Relative: 1.5 % (ref 0.0–5.0)
HCT: 39.5 % (ref 36.0–46.0)
Hemoglobin: 13.5 g/dL (ref 12.0–15.0)
Lymphocytes Relative: 23.5 % (ref 12.0–46.0)
Lymphs Abs: 1.3 10*3/uL (ref 0.7–4.0)
MCHC: 34.2 g/dL (ref 30.0–36.0)
MCV: 95.7 fl (ref 78.0–100.0)
Monocytes Absolute: 0.5 10*3/uL (ref 0.1–1.0)
Monocytes Relative: 9.6 % (ref 3.0–12.0)
Neutro Abs: 3.7 10*3/uL (ref 1.4–7.7)
Neutrophils Relative %: 64.6 % (ref 43.0–77.0)
Platelets: 249 10*3/uL (ref 150.0–400.0)
RBC: 4.13 Mil/uL (ref 3.87–5.11)
RDW: 14.5 % (ref 11.5–15.5)
WBC: 5.7 10*3/uL (ref 4.0–10.5)

## 2019-11-18 LAB — LIPID PANEL
Cholesterol: 175 mg/dL (ref 0–200)
HDL: 59.8 mg/dL (ref >39.00)
LDL Cholesterol: 93 mg/dL (ref 0–99)
NonHDL: 115.65
Total CHOL/HDL Ratio: 3
Triglycerides: 113 mg/dL (ref 0.0–149.0)
VLDL: 22.6 mg/dL (ref 0.0–40.0)

## 2019-11-18 LAB — TSH: TSH: 1.15 u[IU]/mL (ref 0.35–4.50)

## 2019-11-18 NOTE — Assessment & Plan Note (Signed)
Tolerating statin, encouraged heart healthy diet, avoid trans fats, minimize simple carbs and saturated fats. Increase exercise as tolerated 

## 2019-11-18 NOTE — Assessment & Plan Note (Signed)
Per neuro con't meds 

## 2019-11-18 NOTE — Assessment & Plan Note (Signed)
Well controlled, no changes to meds. Encouraged heart healthy diet such as the DASH diet and exercise as tolerated.  °

## 2019-11-18 NOTE — Progress Notes (Signed)
Patient ID: Carrie Hall, female    DOB: January 21, 1934  Age: 86 y.o. MRN: [id number]    Subjective:  Subjective  HPI Carrie Hall presents for f/u chol , thyroid and copd   She has no complaints except she has loose stools several x a week.  No abd pain   No blood in the stool   She has a good appetite  No other complaints.  She wonders if the yogurt she had last night caused it but it happens when she eats things other than dairy as well   No fevers   One episode loose stool this am   Review of Systems  Constitutional: Negative for appetite change, diaphoresis, fatigue and unexpected weight change.  Eyes: Negative for pain, redness and visual disturbance.  Respiratory: Negative for cough, chest tightness, shortness of breath and wheezing.   Cardiovascular: Negative for chest pain, palpitations and leg swelling.  Gastrointestinal: Positive for diarrhea. Negative for abdominal distention, abdominal pain, anal bleeding, blood in stool, constipation, nausea, rectal pain and vomiting.  Endocrine: Negative for cold intolerance, heat intolerance, polydipsia, polyphagia and polyuria.  Genitourinary: Negative for difficulty urinating, dysuria and frequency.  Neurological: Negative for dizziness, light-headedness, numbness and headaches.    History Past Medical History:  Diagnosis Date  . Adenomatous colon polyp   . Breast cancer ([place])   . COPD (chronic obstructive pulmonary disease) ([place])   . Diverticulosis   . Hyperlipidemia   . Internal hemorrhoids   . Osteopenia   . Thyroid disease     She has a past surgical history that includes colon polyps; Breast surgery; and Breast lumpectomy (Left).   Her family history includes Alzheimer's disease in her maternal grandmother and mother; Cancer in her father; Rectal cancer in her sister.She reports that she quit smoking about 15 years ago. Her smoking use included cigarettes. She smoked 0.50 packs per day. She has never used smokeless tobacco.  She reports that she does not drink alcohol and does not use drugs.  Current Outpatient Medications on File Prior to Visit  Medication Sig Dispense Refill  . albuterol (VENTOLIN HFA) 108 (90 Base) MCG/ACT inhaler Inhale 2 puffs into the lungs every 4 (four) hours as needed for wheezing or shortness of breath. 54 g 1  . alendronate (FOSAMAX) 70 MG tablet Take 1 tablet (70 mg total) by mouth every 7 (seven) days. Take with a full glass of water on an empty stomach. 12 tablet 3  . ALPRAZolam (XANAX) 0.25 MG tablet Take 1 tablet (0.25 mg total) by mouth at bedtime as needed for anxiety. 30 tablet 0  . atorvastatin (LIPITOR) 10 MG tablet Take 1 tablet (10 mg total) by mouth daily. 90 tablet 1  . donepezil (ARICEPT) 10 MG tablet Take 1 tablet (10 mg total) by mouth at bedtime. 90 tablet 3  . fluticasone-salmeterol (ADVAIR HFA) 115-21 MCG/ACT inhaler Inhale 2 puffs into the lungs 2 (two) times daily. 1 Inhaler 12  . folic acid (FOLVITE) 1 MG tablet Take 1 mg by mouth daily.    Marland Kitchen levothyroxine (SYNTHROID) 88 MCG tablet Take 1 tablet (88 mcg total) by mouth daily before breakfast. 90 tablet 1  . methotrexate 50 MG/2ML injection SMARTSIG:0.6 Milliliter(s) IM Once a Week     No current facility-administered medications on file prior to visit.     Objective:  Objective  Physical Exam Vitals and nursing note reviewed.  Constitutional:      Appearance: She is well-developed.  HENT:  Head: Normocephalic and atraumatic.  Eyes:     Conjunctiva/sclera: Conjunctivae normal.  Neck:     Thyroid: No thyromegaly.     Vascular: No carotid bruit or JVD.  Cardiovascular:     Rate and Rhythm: Normal rate and regular rhythm.     Heart sounds: Normal heart sounds. No murmur heard.   Pulmonary:     Effort: Pulmonary effort is normal. No respiratory distress.     Breath sounds: Normal breath sounds. No wheezing or rales.  Chest:     Chest wall: No tenderness.  Abdominal:     General: Abdomen is flat.      Palpations: Abdomen is soft.     Tenderness: There is no abdominal tenderness. There is no guarding or rebound.  Musculoskeletal:     Cervical back: Normal range of motion and neck supple.  Neurological:     Mental Status: She is alert and oriented to person, place, and time.    BP 138/68 (BP Location: Right Arm, Patient Position: Sitting, Cuff Size: Normal)   Pulse (!) 57   Temp (!) 97.1 F (36.2 C) (Temporal)   Resp 18   Ht 5\' 4"  (1.626 m)   Wt 148 lb (67.1 kg)   SpO2 95%   BMI 25.40 kg/m  Wt Readings from Last 3 Encounters:  11/18/19 148 lb (67.1 kg)  09/02/19 149 lb 3.2 oz (67.7 kg)  05/07/19 147 lb (66.7 kg)     Lab Results  Component Value Date   WBC 9.5 03/11/2019   HGB 14.0 03/11/2019   HCT 42.4 03/11/2019   PLT 235.0 03/11/2019   GLUCOSE 106 (H) 05/07/2019   CHOL 190 05/07/2019   TRIG 125.0 05/07/2019   HDL 49.60 05/07/2019   LDLDIRECT 103.3 03/07/2014   LDLCALC 115 (H) 05/07/2019   ALT 8 05/07/2019   AST 13 05/07/2019   NA 138 05/07/2019   K 4.1 05/07/2019   CL 101 05/07/2019   CREATININE 0.63 05/07/2019   BUN 13 05/07/2019   CO2 32 05/07/2019   TSH 0.65 05/07/2019   HGBA1C 6.1 07/20/2012    No results found.   Assessment & Plan:  Plan  I am having Evarose H. Bolick maintain her folic acid, ALPRAZolam, Advair HFA, methotrexate, donepezil, atorvastatin, albuterol, alendronate, and levothyroxine.  No orders of the defined types were placed in this encounter.   Problem List Items Addressed This Visit      Unprioritized   COPD with chronic bronchitis ([place])    Stable con't inhalers       Essential hypertension    Well controlled, no changes to meds. Encouraged heart healthy diet such as the DASH diet and exercise as tolerated.       Generalized anxiety disorder    Stable con't meds      Hyperlipidemia LDL goal <100    Tolerating statin, encouraged heart healthy diet, avoid trans fats, minimize simple carbs and saturated fats. Increase  exercise as tolerated      Hypothyroidism    Check labs today con't synthroid      Relevant Orders   TSH   Loose stools    D/w pt fiber and probiotic Will check thyroid today as well rto or call if symptoms worsen and will consider GI       Mild cognitive impairment    Per neuro con't meds      Psoriasis    Check labs for derm        Other Visit Diagnoses  Dyslipidemia    -  Primary   Relevant Orders   Lipid panel   Comprehensive metabolic panel   Encounter for other prophylactic surgery       Relevant Orders   CBC with Differential/Platelet   Need for prophylactic chemotherapy       Relevant Orders   CBC with Differential/Platelet      Follow-up: Return in about 6 months (around 05/20/2020) for annual exam, fasting.  [name], DO

## 2019-11-18 NOTE — Assessment & Plan Note (Signed)
Stable  con't inhalers 

## 2019-11-18 NOTE — Assessment & Plan Note (Signed)
Check labs today °con't synthroid °

## 2019-11-18 NOTE — Patient Instructions (Addendum)
Take probiotic like align or philips colon health  Also add fiber to your diet with fruits and veg---citrucil, metamucil, benefiber etc     Dyslipidemia Dyslipidemia is an imbalance of waxy, fat-like substances (lipids) in the blood. The body needs lipids in small amounts. Dyslipidemia often involves a high level of cholesterol or triglycerides, which are types of lipids. Common forms of dyslipidemia include:  High levels of LDL cholesterol. LDL is the type of cholesterol that causes fatty deposits (plaques) to build up in the blood vessels that carry blood away from your heart (arteries).  Low levels of HDL cholesterol. HDL cholesterol is the type of cholesterol that protects against heart disease. High levels of HDL remove the LDL buildup from arteries.  High levels of triglycerides. Triglycerides are a fatty substance in the blood that is linked to a buildup of plaques in the arteries. What are the causes? Primary dyslipidemia is caused by changes (mutations) in genes that are passed down through families (inherited). These mutations cause several types of dyslipidemia. Secondary dyslipidemia is caused by lifestyle choices and diseases that lead to dyslipidemia, such as:  Eating a diet that is high in animal fat.  Not getting enough exercise.  Having diabetes, kidney disease, liver disease, or thyroid disease.  Drinking large amounts of alcohol.  Using certain medicines. What increases the risk? You are more likely to develop this condition if you are an older man or if you are a woman who has gone through menopause. Other risk factors include:  Having a family history of dyslipidemia.  Taking certain medicines, including birth control pills, steroids, some diuretics, and beta-blockers.  Smoking cigarettes.  Eating a high-fat diet.  Having certain medical conditions such as diabetes, polycystic ovary syndrome (PCOS), kidney disease, liver disease, or hypothyroidism.  Not  exercising regularly.  Being overweight or obese with too much belly fat. What are the signs or symptoms? In most cases, dyslipidemia does not usually cause any symptoms. In severe cases, very high lipid levels can cause:  Fatty bumps under the skin (xanthomas).  White or gray ring around the black center (pupil) of the eye. Very high triglyceride levels can cause inflammation of the pancreas (pancreatitis). How is this diagnosed? Your health care provider may diagnose dyslipidemia based on a routine blood test (fasting blood test). Because most people do not have symptoms of the condition, this blood testing (lipid profile) is done on adults age 84 and older and is repeated every 5 years. This test checks:  Total cholesterol. This measures the total amount of cholesterol in your blood, including LDL cholesterol, HDL cholesterol, and triglycerides. A healthy number is below 200.  LDL cholesterol. The target number for LDL cholesterol is different for each person, depending on individual risk factors. Ask your health care provider what your LDL cholesterol should be.  HDL cholesterol. An HDL level of 60 or higher is best because it helps to protect against heart disease. A number below 88 for men or below 55 for women increases the risk for heart disease.  Triglycerides. A healthy triglyceride number is below 150. If your lipid profile is abnormal, your health care provider may do other blood tests. How is this treated? Treatment depends on the type of dyslipidemia that you have and your other risk factors for heart disease and stroke. Your health care provider will have a target range for your lipid levels based on this information. For many people, this condition may be treated by lifestyle changes, such as  diet and exercise. Your health care provider may recommend that you:  Get regular exercise.  Make changes to your diet.  Quit smoking if you smoke. If diet changes and exercise do  not help you reach your goals, your health care provider may also prescribe medicine to lower lipids. The most commonly prescribed type of medicine lowers your LDL cholesterol (statin drug). If you have a high triglyceride level, your provider may prescribe another type of drug (fibrate) or an omega-3 fish oil supplement, or both. Follow these instructions at home:  Eating and drinking  Follow instructions from your health care provider or dietitian about eating or drinking restrictions.  Eat a healthy diet as told by your health care provider. This can help you reach and maintain a healthy weight, lower your LDL cholesterol, and raise your HDL cholesterol. This may include: ? Limiting your calories, if you are overweight. ? Eating more fruits, vegetables, whole grains, fish, and lean meats. ? Limiting saturated fat, trans fat, and cholesterol.  If you drink alcohol: ? Limit how much you use. ? Be aware of how much alcohol is in your drink. In the [place] one drink equals one 12 oz bottle of beer (355 mL), one 5 oz glass of wine (148 mL), or one 1 oz glass of hard liquor (44 mL).  Do not drink alcohol if: ? Your health care provider tells you not to drink. ? You are pregnant, may be pregnant, or are planning to become pregnant. Activity  Get regular exercise. Start an exercise and strength training program as told by your health care provider. Ask your health care provider what activities are safe for you. Your health care provider may recommend: ? 30 minutes of aerobic activity 4-6 days a week. Brisk walking is an example of aerobic activity. ? Strength training 2 days a week. General instructions  Do not use any products that contain nicotine or tobacco, such as cigarettes, e-cigarettes, and chewing tobacco. If you need help quitting, ask your health care provider.  Take over-the-counter and prescription medicines only as told by your health care provider. This includes  supplements.  Keep all follow-up visits as told by your health care provider. Contact a health care provider if:  You are: ? Having trouble sticking to your exercise or diet plan. ? Struggling to quit smoking or control your use of alcohol. Summary  Dyslipidemia often involves a high level of cholesterol or triglycerides, which are types of lipids.  Treatment depends on the type of dyslipidemia that you have and your other risk factors for heart disease and stroke.  For many people, treatment starts with lifestyle changes, such as diet and exercise.  Your health care provider may prescribe medicine to lower lipids. This information is not intended to replace advice given to you by your health care provider. Make sure you discuss any questions you have with your health care provider. Document Revised: 12/18/2017 Document Reviewed: 11/24/2017 Elsevier Patient Education  [place].

## 2019-11-18 NOTE — Assessment & Plan Note (Signed)
Stable con't meds 

## 2019-11-18 NOTE — Assessment & Plan Note (Signed)
Check labs for derm

## 2019-11-18 NOTE — Assessment & Plan Note (Signed)
D/w pt fiber and probiotic Will check thyroid today as well rto or call if symptoms worsen and will consider GI

## 2019-11-19 DIAGNOSIS — R030 Elevated blood-pressure reading, without diagnosis of hypertension: Secondary | ICD-10-CM | POA: Diagnosis not present

## 2019-11-19 DIAGNOSIS — J449 Chronic obstructive pulmonary disease, unspecified: Secondary | ICD-10-CM | POA: Diagnosis not present

## 2019-11-19 DIAGNOSIS — K5900 Constipation, unspecified: Secondary | ICD-10-CM | POA: Diagnosis not present

## 2019-11-19 DIAGNOSIS — I739 Peripheral vascular disease, unspecified: Secondary | ICD-10-CM | POA: Diagnosis not present

## 2019-11-19 DIAGNOSIS — M810 Age-related osteoporosis without current pathological fracture: Secondary | ICD-10-CM | POA: Diagnosis not present

## 2019-11-19 DIAGNOSIS — L409 Psoriasis, unspecified: Secondary | ICD-10-CM | POA: Diagnosis not present

## 2019-11-19 DIAGNOSIS — E785 Hyperlipidemia, unspecified: Secondary | ICD-10-CM | POA: Diagnosis not present

## 2019-11-19 DIAGNOSIS — G3184 Mild cognitive impairment, so stated: Secondary | ICD-10-CM | POA: Diagnosis not present

## 2019-11-19 DIAGNOSIS — R32 Unspecified urinary incontinence: Secondary | ICD-10-CM | POA: Diagnosis not present

## 2019-11-19 DIAGNOSIS — E039 Hypothyroidism, unspecified: Secondary | ICD-10-CM | POA: Diagnosis not present

## 2019-11-20 DIAGNOSIS — L408 Other psoriasis: Secondary | ICD-10-CM | POA: Diagnosis not present

## 2019-11-27 DIAGNOSIS — L408 Other psoriasis: Secondary | ICD-10-CM | POA: Diagnosis not present

## 2019-12-04 DIAGNOSIS — L408 Other psoriasis: Secondary | ICD-10-CM | POA: Diagnosis not present

## 2019-12-09 ENCOUNTER — Telehealth: Payer: Self-pay | Admitting: Pharmacist

## 2019-12-09 NOTE — Progress Notes (Addendum)
Chronic Care Management Pharmacy Assistant   Name: LASHEA GODA  MRN: [id number] DOB: [birth year]  Reason for Encounter: Medication Review   PCP : [name], DO  Allergies:   Allergies  Allergen Reactions  . Pneumococcal Vaccines     Medications: Outpatient Encounter Medications as of 12/09/2019  Medication Sig  . albuterol (VENTOLIN HFA) 108 (90 Base) MCG/ACT inhaler Inhale 2 puffs into the lungs every 4 (four) hours as needed for wheezing or shortness of breath.  Marland Kitchen alendronate (FOSAMAX) 70 MG tablet Take 1 tablet (70 mg total) by mouth every 7 (seven) days. Take with a full glass of water on an empty stomach.  . ALPRAZolam (XANAX) 0.25 MG tablet Take 1 tablet (0.25 mg total) by mouth at bedtime as needed for anxiety.  Marland Kitchen atorvastatin (LIPITOR) 10 MG tablet Take 1 tablet (10 mg total) by mouth daily.  Marland Kitchen donepezil (ARICEPT) 10 MG tablet Take 1 tablet (10 mg total) by mouth at bedtime.  . fluticasone-salmeterol (ADVAIR HFA) 115-21 MCG/ACT inhaler Inhale 2 puffs into the lungs 2 (two) times daily.  . folic acid (FOLVITE) 1 MG tablet Take 1 mg by mouth daily.  Marland Kitchen levothyroxine (SYNTHROID) 88 MCG tablet Take 1 tablet (88 mcg total) by mouth daily before breakfast.  . methotrexate 50 MG/2ML injection SMARTSIG:0.6 Milliliter(s) IM Once a Week   No facility-administered encounter medications on file as of 12/09/2019.    Current Diagnosis: Patient Active Problem List   Diagnosis Date Noted  . Psoriasis 11/18/2019  . Essential hypertension 02/12/2018  . Generalized anxiety disorder 01/01/2018  . Mild cognitive impairment 11/10/2017  . Elevated LFTs 10/04/2017  . Loss of weight 10/04/2017  . Loose stools 10/04/2017  . Weight loss, non-intentional 08/07/2017  . Preventative health care 08/07/2017  . Cerumen impaction 08/25/2015  . 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  . Memory loss 08/25/2015  . Diarrhea 12/19/2014  . KNEE PAIN, LEFT 05/27/2010  . DYSURIA 05/27/2010    . POSTMENOPAUSAL STATUS 04/23/2010  . CERVICAL POLYP 10/26/2009  . HEMATURIA, HX OF 10/26/2009  . DIZZINESS 06/11/2009  . FOOT, PAIN 11/17/2008  . OTHER ABNORMAL BLOOD CHEMISTRY 11/17/2008  . TINEA CRURIS 09/15/2008  . UTI 09/15/2008  . COLONIC POLYPS, ADENOMATOUS, HX OF 01/16/2008  . HEMOCCULT POSITIVE STOOL 12/11/2007  . BREAST CANCER, HX OF 12/11/2007  . Hypothyroidism 12/07/2007  . Hyperlipidemia LDL goal <100 12/07/2007  . COPD with chronic bronchitis ([place]) 12/07/2007  . Disorder of bone and articular cartilage 12/07/2007  . URI 03/13/2007  . HX, URINARY INFECTION 01/09/2007    Goals Addressed   None     Follow-Up:  [facility]ination [facility]  Reviewed chart for medication changes ahead of medication coordination call.  11-18-2019 OV. Patient presented in the office with Dr. Carollee Herter for f/u chol, thyroid and COPD. There were no significant findings. No medication changes. indicated .  BP Readings from Last 3 Encounters:  11/18/19 138/68  09/02/19 124/60  05/07/19 118/80    Lab Results  Component Value Date   HGBA1C 6.1 07/20/2012     Patient obtains medications through [facility]  90 Days but would like to switch back to vials as she likes to fill up her own pill box on Sundays.  Last adherence delivery included: Alendronate 70mg  Every Monday- Before Breakfast   Levothyroxine 23mcg daily- Before Breakfast   Folic Acid 1mg  daily- Breakfast   Atorvastatin 10mg  daily- Bedtime   Donepezil 10mg  - Bedtime  Albuterol Aer Hfa   Advair Hfa Aer 115/21   Patient declined Alprozolam 0.25mg   last month due to PRN use and additional supply on hand.  Patient is due for next adherence delivery on: 01-13-2020. Called patient and reviewed medications and coordinated delivery.  This delivery to include:  Methotrexate Inj 25mg /ml; to be done in the office weekly Alendronate 70mg  Every Monday- Before Breakfast   Levothyroxine 62mcg daily- Before  Breakfast   Folic Acid 1mg  daily- Breakfast   Atorvastatin 10mg  daily- Bedtime   Donepezil 10mg  - Bedtime   Albuterol Aer Hfa   Advair Hfa Aer   Patient declined the following medications Alprazolam 0.25mg  due to enough on hand.  Patient needs refills for Atorvastatin 10mg  tab.  Confirmed delivery date of 01-13-2020 advised patient that pharmacy will contact them the morning of delivery.  Fanny Skates, [place] Pharmacist Assistant [phone]  Reviewed by: De Blanch, PharmD Clinical Pharmacist [facility] [phone]

## 2019-12-11 DIAGNOSIS — L408 Other psoriasis: Secondary | ICD-10-CM | POA: Diagnosis not present

## 2019-12-18 ENCOUNTER — Telehealth: Payer: Self-pay | Admitting: Pharmacist

## 2019-12-18 ENCOUNTER — Other Ambulatory Visit: Payer: Self-pay

## 2019-12-18 MED ORDER — ATORVASTATIN CALCIUM 10 MG PO TABS: 10.0000 mg | ORAL_TABLET | Freq: Every day | ORAL | 1 refills | Status: DC

## 2019-12-18 NOTE — Progress Notes (Addendum)
    Chronic Care Management Pharmacy Assistant   Name: Carrie Hall  MRN: [id number] DOB: 10/27/1933  Reason for Encounter: Medication Review  PCP : [name], DO  Allergies:   Allergies  Allergen Reactions  . Pneumococcal Vaccines     Medications: Outpatient Encounter Medications as of 12/18/2019  Medication Sig  . albuterol (VENTOLIN HFA) 108 (90 Base) MCG/ACT inhaler Inhale 2 puffs into the lungs every 4 (four) hours as needed for wheezing or shortness of breath.  Marland Kitchen alendronate (FOSAMAX) 70 MG tablet Take 1 tablet (70 mg total) by mouth every 7 (seven) days. Take with a full glass of water on an empty stomach.  . ALPRAZolam (XANAX) 0.25 MG tablet Take 1 tablet (0.25 mg total) by mouth at bedtime as needed for anxiety.  Marland Kitchen atorvastatin (LIPITOR) 10 MG tablet Take 1 tablet (10 mg total) by mouth daily.  Marland Kitchen donepezil (ARICEPT) 10 MG tablet Take 1 tablet (10 mg total) by mouth at bedtime.  . fluticasone-salmeterol (ADVAIR HFA) 115-21 MCG/ACT inhaler Inhale 2 puffs into the lungs 2 (two) times daily.  . folic acid (FOLVITE) 1 MG tablet Take 1 mg by mouth daily.  Marland Kitchen levothyroxine (SYNTHROID) 88 MCG tablet Take 1 tablet (88 mcg total) by mouth daily before breakfast.  . methotrexate 50 MG/2ML injection SMARTSIG:0.6 Milliliter(s) IM Once a Week   No facility-administered encounter medications on file as of 12/18/2019.    Current Diagnosis: Patient Active Problem List   Diagnosis Date Noted  . Psoriasis 11/18/2019  . Essential hypertension 02/12/2018  . Generalized anxiety disorder 01/01/2018  . Mild cognitive impairment 11/10/2017  . Elevated LFTs 10/04/2017  . Loss of weight 10/04/2017  . Loose stools 10/04/2017  . Weight loss, non-intentional 08/07/2017  . Preventative health care 08/07/2017  . Cerumen impaction 08/25/2015  . 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  . Memory loss 08/25/2015  . Diarrhea 12/19/2014  . KNEE PAIN, LEFT 05/27/2010  . DYSURIA 05/27/2010    . POSTMENOPAUSAL STATUS 04/23/2010  . CERVICAL POLYP 10/26/2009  . HEMATURIA, HX OF 10/26/2009  . DIZZINESS 06/11/2009  . FOOT, PAIN 11/17/2008  . OTHER ABNORMAL BLOOD CHEMISTRY 11/17/2008  . TINEA CRURIS 09/15/2008  . UTI 09/15/2008  . COLONIC POLYPS, ADENOMATOUS, HX OF 01/16/2008  . HEMOCCULT POSITIVE STOOL 12/11/2007  . BREAST CANCER, HX OF 12/11/2007  . Hypothyroidism 12/07/2007  . Hyperlipidemia LDL goal <100 12/07/2007  . COPD with chronic bronchitis ([place]) 12/07/2007  . Disorder of bone and articular cartilage 12/07/2007  . URI 03/13/2007  . HX, URINARY INFECTION 01/09/2007    Goals Addressed   None    Patient called CPP and left a voicemail requesting medication. I returned the patient's call. She stated she needed a refill of her Advair and Ventolin inhalers urgently. Medication has been coordinated appropriately and scheduled to be delivered 12-19-2019.  Follow-Up:  Coordination of [place], [place] Pharmacist Assistant [phone]

## 2019-12-19 DIAGNOSIS — L408 Other psoriasis: Secondary | ICD-10-CM | POA: Diagnosis not present

## 2019-12-25 DIAGNOSIS — L408 Other psoriasis: Secondary | ICD-10-CM | POA: Diagnosis not present

## 2020-01-01 DIAGNOSIS — L408 Other psoriasis: Secondary | ICD-10-CM | POA: Diagnosis not present

## 2020-01-06 ENCOUNTER — Telehealth: Payer: Self-pay | Admitting: Pharmacist

## 2020-01-06 NOTE — Progress Notes (Addendum)
Chronic Care Management Pharmacy Assistant   Name: Carrie Hall  MRN: [id number] DOB: 1933/08/07  Reason for Encounter: Medication Review   PCP : Carrie Held, DO  Allergies:   Allergies  Allergen Reactions  . Pneumococcal Vaccines     Medications: Outpatient Encounter Medications as of 01/06/2020  Medication Sig  . albuterol (VENTOLIN HFA) 108 (90 Base) MCG/ACT inhaler Inhale 2 puffs into the lungs every 4 (four) hours as needed for wheezing or shortness of breath.  Marland Kitchen alendronate (FOSAMAX) 70 MG tablet Take 1 tablet (70 mg total) by mouth every 7 (seven) days. Take with a full glass of water on an empty stomach.  . ALPRAZolam (XANAX) 0.25 MG tablet Take 1 tablet (0.25 mg total) by mouth at bedtime as needed for anxiety.  Marland Kitchen atorvastatin (LIPITOR) 10 MG tablet Take 1 tablet (10 mg total) by mouth daily.  Marland Kitchen donepezil (ARICEPT) 10 MG tablet Take 1 tablet (10 mg total) by mouth at bedtime.  . fluticasone-salmeterol (ADVAIR HFA) 115-21 MCG/ACT inhaler Inhale 2 puffs into the lungs 2 (two) times daily.  . folic acid (FOLVITE) 1 MG tablet Take 1 mg by mouth daily.  Marland Kitchen levothyroxine (SYNTHROID) 88 MCG tablet Take 1 tablet (88 mcg total) by mouth daily before breakfast.  . methotrexate 50 MG/2ML injection SMARTSIG:0.6 Milliliter(s) IM Once a Week   No facility-administered encounter medications on file as of 01/06/2020.    Current Diagnosis: Patient Active Problem List   Diagnosis Date Noted  . Psoriasis 11/18/2019  . Essential hypertension 02/12/2018  . Generalized anxiety disorder 01/01/2018  . Mild cognitive impairment 11/10/2017  . Elevated LFTs 10/04/2017  . Loss of weight 10/04/2017  . Loose stools 10/04/2017  . Weight loss, non-intentional 08/07/2017  . Preventative health care 08/07/2017  . Cerumen impaction 08/25/2015  . 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  . Memory loss 08/25/2015  . Diarrhea 12/19/2014  . KNEE PAIN, LEFT 05/27/2010  . DYSURIA 05/27/2010   . POSTMENOPAUSAL STATUS 04/23/2010  . CERVICAL POLYP 10/26/2009  . HEMATURIA, HX OF 10/26/2009  . DIZZINESS 06/11/2009  . FOOT, PAIN 11/17/2008  . OTHER ABNORMAL BLOOD CHEMISTRY 11/17/2008  . TINEA CRURIS 09/15/2008  . UTI 09/15/2008  . COLONIC POLYPS, ADENOMATOUS, HX OF 01/16/2008  . HEMOCCULT POSITIVE STOOL 12/11/2007  . BREAST CANCER, HX OF 12/11/2007  . Hypothyroidism 12/07/2007  . Hyperlipidemia LDL goal <100 12/07/2007  . COPD with chronic bronchitis ([place]) 12/07/2007  . Disorder of bone and articular cartilage 12/07/2007  . URI 03/13/2007  . HX, URINARY INFECTION 01/09/2007    Goals Addressed   None    Reviewed chart for medication changes ahead of medication coordination call.  No OVs, Consults, or hospital visits since last care coordination call.  No medication changes indicated.  BP Readings from Last 3 Encounters:  11/18/19 138/68  09/02/19 124/60  05/07/19 118/80    Lab Results  Component Value Date   HGBA1C 6.1 07/20/2012     Patient obtains medications through Vials  90 Days   Last adherence delivery included:  Methotrexate Injection 25mg /ml; inject 0.24ml every week as directed (done in the office)   Atorvastatin 10mg : one tab with breakfast   Folic Acid 1mg : one tab with breakfast   Levothyroxine 23mcg; one tab at bedtime   Donepezil 10mg ; one tab at bedtime   Alendronate 70mg ; one tab on Monday weekly   Advair inhaler 115/21; inhale two puffs into the lungs twice a day   Ventolin Inhaler;  two puffs every four hours as needed   Patient declined Alprazolam 0.25mg mg; one tab PRN  Clobetasol 0.05% topical cream PRN last month due to PRN use/additional supply on hand.   Patient is due for next adherence delivery on: 01-14-2020. Called patient and reviewed medications and coordinated delivery.  This delivery to include:  Methotrexate Injection 25mg /ml; inject 0.53ml every week as directed (done in the office)   Atorvastatin 10mg : one tab  with Breakfast   Levothyroxine 80mcg; one tab at Breakfast  Folic Acid 1mg ; one tab at Breakfast  Alendronate 70mg ; one tab on Monday weekly    Patient declined the following medications Alprazolam 0.25mg ; one tab PRN  Clobetasol 0.05% topical cream PRN Donepezil 10mg ; one tab at bedtime has enough on hand Advair inhaler 115/21; inhale two puffs into the lungs twice a day  Ventolin Inhaler; two puffs every four hours as needed   Patient needs refills of methotrexate. Will have these requested from Dr. Astrid Drafts office 01/14/20  Confirmed delivery date of 01-14-2020, advised patient that pharmacy will contact them the morning of delivery.  Follow-Up:  Coordination of Enhanced Pharmacy Services   Fanny Skates, [place] Pharmacist Assistant [phone]  Called patient and discussed adherence with donepezil. She states it would be easier for her to fill her own pill box. Will move patient back into vials. Follow up scheduled for 01/23/20 to address inhaler adherence.  Reviewed by: De Blanch, PharmD Clinical Pharmacist [facility] at [facility] [phone]

## 2020-01-08 DIAGNOSIS — L408 Other psoriasis: Secondary | ICD-10-CM | POA: Diagnosis not present

## 2020-01-14 ENCOUNTER — Telehealth: Payer: Self-pay | Admitting: Pharmacist

## 2020-01-14 NOTE — Progress Notes (Addendum)
    Chronic Care Management Pharmacy Assistant   Name: Carrie Hall  MRN: [id number] DOB: 1933/10/13  Reason for Encounter: Medication Refill   PCP : [name], DO  Allergies:   Allergies  Allergen Reactions  . Pneumococcal Vaccines     Medications: Outpatient Encounter Medications as of 01/14/2020  Medication Sig  . albuterol (VENTOLIN HFA) 108 (90 Base) MCG/ACT inhaler Inhale 2 puffs into the lungs every 4 (four) hours as needed for wheezing or shortness of breath.  Marland Kitchen alendronate (FOSAMAX) 70 MG tablet Take 1 tablet (70 mg total) by mouth every 7 (seven) days. Take with a full glass of water on an empty stomach.  . ALPRAZolam (XANAX) 0.25 MG tablet Take 1 tablet (0.25 mg total) by mouth at bedtime as needed for anxiety.  Marland Kitchen atorvastatin (LIPITOR) 10 MG tablet Take 1 tablet (10 mg total) by mouth daily.  Marland Kitchen donepezil (ARICEPT) 10 MG tablet Take 1 tablet (10 mg total) by mouth at bedtime.  . fluticasone-salmeterol (ADVAIR HFA) 115-21 MCG/ACT inhaler Inhale 2 puffs into the lungs 2 (two) times daily.  . folic acid (FOLVITE) 1 MG tablet Take 1 mg by mouth daily.  Marland Kitchen levothyroxine (SYNTHROID) 88 MCG tablet Take 1 tablet (88 mcg total) by mouth daily before breakfast.  . methotrexate 50 MG/2ML injection SMARTSIG:0.6 Milliliter(s) IM Once a Week   No facility-administered encounter medications on file as of 01/14/2020.    Current Diagnosis: Patient Active Problem List   Diagnosis Date Noted  . Psoriasis 11/18/2019  . Essential hypertension 02/12/2018  . Generalized anxiety disorder 01/01/2018  . Mild cognitive impairment 11/10/2017  . Elevated LFTs 10/04/2017  . Loss of weight 10/04/2017  . Loose stools 10/04/2017  . Weight loss, non-intentional 08/07/2017  . Preventative health care 08/07/2017  . Cerumen impaction 08/25/2015  . 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  . Memory loss 08/25/2015  . Diarrhea 12/19/2014  . KNEE PAIN, LEFT 05/27/2010  . DYSURIA 05/27/2010    . POSTMENOPAUSAL STATUS 04/23/2010  . CERVICAL POLYP 10/26/2009  . HEMATURIA, HX OF 10/26/2009  . DIZZINESS 06/11/2009  . FOOT, PAIN 11/17/2008  . OTHER ABNORMAL BLOOD CHEMISTRY 11/17/2008  . TINEA CRURIS 09/15/2008  . UTI 09/15/2008  . COLONIC POLYPS, ADENOMATOUS, HX OF 01/16/2008  . HEMOCCULT POSITIVE STOOL 12/11/2007  . BREAST CANCER, HX OF 12/11/2007  . Hypothyroidism 12/07/2007  . Hyperlipidemia LDL goal <100 12/07/2007  . COPD with chronic bronchitis ([place]) 12/07/2007  . Disorder of bone and articular cartilage 12/07/2007  . URI 03/13/2007  . HX, URINARY INFECTION 01/09/2007    Goals Addressed   None    01-14-2020 Made outbound call to patient's dermatologist Dr. Susie Cassette for Methotrexate refill. Unable to make contact with someone in the office directly. Left a message for a return call.  01-16-2020 Made outbound call to Dr. Jennet Maduro office for Methotrexate refill. Spoke with Jasmine at the office who took the refill information to pass to the triage nurse.  01-20-2020: Verified with [facility] the Methotrexate has been received by the pharmacy. This medication was renewed for a 28 day supply to be given once a week as directed. Patient will need a refill on or before 02-12-2020.  Follow-Up:  Pharmacist Review   Fanny Skates, [place] Pharmacist Assistant [phone]  Reviewed by: De Blanch, PharmD Clinical Pharmacist [place] [facility] [facility] at [facility] [phone]

## 2020-01-15 DIAGNOSIS — L408 Other psoriasis: Secondary | ICD-10-CM | POA: Diagnosis not present

## 2020-01-22 DIAGNOSIS — L408 Other psoriasis: Secondary | ICD-10-CM | POA: Diagnosis not present

## 2020-01-23 ENCOUNTER — Ambulatory Visit: Payer: Medicare HMO | Admitting: Pharmacist

## 2020-01-23 DIAGNOSIS — I10 Essential (primary) hypertension: Secondary | ICD-10-CM

## 2020-01-23 DIAGNOSIS — J449 Chronic obstructive pulmonary disease, unspecified: Secondary | ICD-10-CM

## 2020-01-23 DIAGNOSIS — E785 Hyperlipidemia, unspecified: Secondary | ICD-10-CM

## 2020-01-23 DIAGNOSIS — M8580 Other specified disorders of bone density and structure, unspecified site: Secondary | ICD-10-CM
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
## 2020-01-23 NOTE — Patient Instructions (Signed)
Visit Information  Goals Addressed            This Visit's Progress   . Chronic Care Management Pharmacy Care Plan       CARE PLAN ENTRY (see longitudinal plan of care for additional care plan information)  Current Barriers:  . Chronic Disease Management support, education, and care coordination needs related to COPD, HTN, HLD, Hypothyroidism, Anxiety, Osteoporosis, Memory Loss, Psorasis   Hypertension BP Readings from Last 3 Encounters:  11/18/19 138/68  09/02/19 124/60  05/07/19 118/80   . Pharmacist Clinical Goal(s): o Over the next 180 days, patient will work with PharmD and providers to maintain BP goal <140/90 . Current regimen:  o Diet and exercise management   . Patient self care activities - Over the next 180 days, patient will: o Maintain blood pressure <140/90  Hyperlipidemia Lab Results  Component Value Date/Time   LDLCALC 93 11/18/2019 10:03 AM   LDLDIRECT 103.3 03/07/2014 11:23 AM   . Pharmacist Clinical Goal(s): o Over the next 180 days, patient will work with PharmD and providers to maintain LDL goal < 100 . Current regimen:  o Atorvastatin 10mg  daily . Interventions: o LDL at goal and improved from previous! Congratulations! . Patient self care activities - Over the next 180 days, patient will: o Maintain cholesterol medication regimen.   COPD . Pharmacist Clinical Goal(s) o Over the next 180 days, patient will work with PharmD and providers to reduce symptoms associated with COPD . Current regimen:   Advair 115-21 2 puffs twice daily  Ventolin as needed  . Interventions: o Discussed how Advair can help prevent further lung decline even if she does not feel short of breath . Patient self care activities - Over the next 180 days, patient will: o Use Advair daily  Osteoporosis . Pharmacist Clinical Goal(s) o Over the next 180 days, patient will work with PharmD and providers to reduce risk of fracture due to osteoporosis  . Current regimen:   o Alendronate 70mg  weekly . Interventions: o Discussed timing of medication o Consider repeat DEXA . Patient self care activities - Over the next 180 days, patient will: o Maintain osteoporosis medication regiment o Complete DEXA Scan  Medication management . Pharmacist Clinical Goal(s): o Over the next 180 days, patient will work with PharmD and providers to achieve optimal medication adherence . Current pharmacy: [facility] . Interventions o Comprehensive medication review performed. o Utilize [facility] pharmacy for medication synchronization, packaging and delivery . Patient self care activities - Over the next 180 days, patient will: o Focus on medication adherence by filling and taking medications appropriately  o Take medications as prescribed o Report any questions or concerns to PharmD and/or provider(s)  Please see past updates related to this goal by clicking on the "Past Updates" button in the selected goal          The patient verbalized understanding of instructions provided today and agreed to receive a mailed copy of patient instruction and/or educational materials.  Telephone follow up appointment with pharmacy team member scheduled for: 07/22/2020  Melvenia Beam Carrie Hall, PharmD Clinical Pharmacist [facility] at [facility] [phone]

## 2020-01-24 ENCOUNTER — Telehealth: Payer: Medicare HMO

## 2020-01-28 DIAGNOSIS — Z79899 Other long term (current) drug therapy: Secondary | ICD-10-CM | POA: Diagnosis not present

## 2020-01-28 DIAGNOSIS — L400 Psoriasis vulgaris: Secondary | ICD-10-CM | POA: Diagnosis not present

## 2020-01-29 DIAGNOSIS — L408 Other psoriasis: Secondary | ICD-10-CM | POA: Diagnosis not present

## 2020-02-05 ENCOUNTER — Telehealth: Payer: Self-pay | Admitting: Pharmacist

## 2020-02-05 DIAGNOSIS — L408 Other psoriasis: Secondary | ICD-10-CM | POA: Diagnosis not present

## 2020-02-05 NOTE — Progress Notes (Addendum)
Chronic Care Management Pharmacy Assistant   Name: MANAHIL VANZILE  MRN: [id number] DOB: May 09, 1934  Reason for Encounter: Medication Review  PCP : [name], DO   Their chronic conditions include: COPD, HTN, HLD, Hypothyroidism, Anxiety, Osteoporosis, Memory Loss, Psorasis.  Allergies:   Allergies  Allergen Reactions  . Pneumococcal Vaccines     Medications: Outpatient Encounter Medications as of 02/05/2020  Medication Sig  . albuterol (VENTOLIN HFA) 108 (90 Base) MCG/ACT inhaler Inhale 2 puffs into the lungs every 4 (four) hours as needed for wheezing or shortness of breath.  Marland Kitchen alendronate (FOSAMAX) 70 MG tablet Take 1 tablet (70 mg total) by mouth every 7 (seven) days. Take with a full glass of water on an empty stomach.  . ALPRAZolam (XANAX) 0.25 MG tablet Take 1 tablet (0.25 mg total) by mouth at bedtime as needed for anxiety.  Marland Kitchen atorvastatin (LIPITOR) 10 MG tablet Take 1 tablet (10 mg total) by mouth daily.  Marland Kitchen donepezil (ARICEPT) 10 MG tablet Take 1 tablet (10 mg total) by mouth at bedtime.  . fluticasone-salmeterol (ADVAIR HFA) 115-21 MCG/ACT inhaler Inhale 2 puffs into the lungs 2 (two) times daily.  . folic acid (FOLVITE) 1 MG tablet Take 1 mg by mouth daily.  Marland Kitchen levothyroxine (SYNTHROID) 88 MCG tablet Take 1 tablet (88 mcg total) by mouth daily before breakfast.  . methotrexate 50 MG/2ML injection SMARTSIG:0.6 Milliliter(s) IM Once a Week   No facility-administered encounter medications on file as of 02/05/2020.    Current Diagnosis: Patient Active Problem List   Diagnosis Date Noted  . Psoriasis 11/18/2019  . Essential hypertension 02/12/2018  . Generalized anxiety disorder 01/01/2018  . Mild cognitive impairment 11/10/2017  . Elevated LFTs 10/04/2017  . Loss of weight 10/04/2017  . Loose stools 10/04/2017  . Weight loss, non-intentional 08/07/2017  . Preventative health care 08/07/2017  . Cerumen impaction 08/25/2015  . Hearing loss of both ears  08/25/2015  . Memory loss 08/25/2015  . Diarrhea 12/19/2014  . KNEE PAIN, LEFT 05/27/2010  . DYSURIA 05/27/2010  . POSTMENOPAUSAL STATUS 04/23/2010  . CERVICAL POLYP 10/26/2009  . HEMATURIA, HX OF 10/26/2009  . DIZZINESS 06/11/2009  . FOOT, PAIN 11/17/2008  . OTHER ABNORMAL BLOOD CHEMISTRY 11/17/2008  . TINEA CRURIS 09/15/2008  . UTI 09/15/2008  . COLONIC POLYPS, ADENOMATOUS, HX OF 01/16/2008  . HEMOCCULT POSITIVE STOOL 12/11/2007  . BREAST CANCER, HX OF 12/11/2007  . Hypothyroidism 12/07/2007  . Hyperlipidemia LDL goal <100 12/07/2007  . COPD with chronic bronchitis ([place]) 12/07/2007  . Disorder of bone and articular cartilage 12/07/2007  . URI 03/13/2007  . HX, URINARY INFECTION 01/09/2007    Goals Addressed   None    Reviewed chart for medication changes ahead of medication coordination call.  No OVs, Consults, or hospital visits since last Pharmacist visit.   No medication changes indicated.  BP Readings from Last 3 Encounters:  11/18/19 138/68  09/02/19 124/60  05/07/19 118/80    Lab Results  Component Value Date   HGBA1C 6.1 07/20/2012     Patient obtains medications through Vials  90 Days   Last adherence delivery included:   Methotrexate Injection 25mg /ml; inject 0.48ml every week as directed (done in the office)   Atorvastatin 10mg : one tab with Breakfast   Levothyroxine 19mcg; one tab at Breakfast  Folic Acid 1mg ; one tab at Breakfast  Alendronate 70mg ; one tab on Monday weekly   Patient declined the following medications last month due to PRN use/additional  supply on hand.  Alprazolam 0.25mg ; one tab PRN   Clobetasol 0.05% topical cream PRN  Donepezil 10mg ; one tab at bedtime has enough on hand  Advair inhaler 115/21; inhale two puffs into the lungs twice a day   Ventolin Inhaler; two puffs every four hours as needed    Patient is due for next adherence delivery on: 02-12-2020. Called patient and reviewed medications and coordinated  delivery.  This delivery to include:  Alprazolam 0.25mg ; one tab PRN  Advair inhaler 115/21; inhale two puffs into the lungs twice a day   Ventolin Inhaler; two puffs every four hours as needed    Patient declined the following medications  Methotrexate Injection 25mg /ml; inject 0.67ml every week as directed. (Patient states she has six vials on hand.)  Atorvastatin 10mg : one tab with Breakfast   Levothyroxine 9mcg; one tab at Breakfast  Folic Acid 1mg ; one tab at Breakfast  Alendronate 70mg ; one tab on Monday weekly Clobetasol 0.05% topical cream PRN  Donepezil 10mg ; one tab at bedtime has enough on hand   Patient needs refills for Alprazolam 0.25mg  (sending message to Dr. Nonda Lou MA) and Methotrexate injection 25 mg/mL. Made outbound call to Dr. Astrid Drafts office for prescription refill. Left a message for Sharee Pimple the triage nurse to return my call before I knew the patient had six vials on hand. Jill from Dr. Astrid Drafts called me back. Stated the patient came the office on 01-22-20 with two injections on hand from [facility] and four vials from [facility] and should have a total of four vials. She had one vial left on 01-15-20. Required bloodwork done before Dr. Susie Cassette would write a new prescription. Sharee Pimple states patient keeps her Methotrexate vials in her purse and brings them to each appointment with Dr. Susie Cassette. She will verify how many vials she has at her next appointment on 02-12-2020.  Confirmed delivery date of 02-12-2020, advised patient that pharmacy will contact them the morning of delivery.  Follow-Up:  Coordination of Enhanced Pharmacy Services   Fanny Skates, [place] Pharmacist Assistant [phone]  Reviewed by: De Blanch, PharmD Clinical Pharmacist [facility] [phone]

## 2020-02-12 ENCOUNTER — Other Ambulatory Visit: Payer: Self-pay

## 2020-02-12 DIAGNOSIS — L408 Other psoriasis: Secondary | ICD-10-CM | POA: Diagnosis not present

## 2020-02-12 DIAGNOSIS — F411 Generalized anxiety disorder: Secondary | ICD-10-CM

## 2020-02-12 MED ORDER — ALPRAZOLAM 0.25 MG PO TABS: 0.2500 mg | ORAL_TABLET | Freq: Every evening | ORAL | 0 refills | Status: DC | PRN

## 2020-02-12 NOTE — Telephone Encounter (Signed)
-----   Message from Lafonda Mosses sent at 02/12/2020 10:06 AM EDT ----- Regarding: Medication Refill Request Good morning. I am checking on the status of a medication refill for Alprazolam 0.25 mg for the patient mentioned above. I saw that you marked this task as "done", however, the pharmacist at [facility] states a refill has not been sent. Can you please check on this for me? The patient is scheduled for a delivery today.  Thank you, Fanny Skates, [place] Pharmacist Assistant [phone]

## 2020-02-12 NOTE — Telephone Encounter (Signed)
Requesting: Xanax Contract: 01/01/18 UDS: 01/01/18 Last OV: 11/18/19 Next OV: 05/12/20 Last Refill: 07/27/19, #30--0 RF Database:   Please advise

## 2020-02-19 DIAGNOSIS — L408 Other psoriasis: Secondary | ICD-10-CM | POA: Diagnosis not present

## 2020-03-05 ENCOUNTER — Other Ambulatory Visit: Payer: Self-pay

## 2020-03-05 ENCOUNTER — Encounter: Payer: Self-pay | Admitting: Neurology

## 2020-03-05 ENCOUNTER — Ambulatory Visit: Payer: Medicare HMO | Admitting: Neurology

## 2020-03-05 VITALS — BP 102/68 | HR 68 | Ht 64.0 in | Wt 148.2 lb

## 2020-03-05 DIAGNOSIS — F03A0 Unspecified dementia, mild, without behavioral disturbance, psychotic disturbance, mood disturbance, and anxiety: Secondary | ICD-10-CM

## 2020-03-05 DIAGNOSIS — F0390 Unspecified dementia without behavioral disturbance: Secondary | ICD-10-CM

## 2020-03-05 DIAGNOSIS — L408 Other psoriasis: Secondary | ICD-10-CM | POA: Diagnosis not present

## 2020-03-05 DIAGNOSIS — R69 Illness, unspecified: Secondary | ICD-10-CM | POA: Diagnosis not present

## 2020-03-05 MED ORDER — DONEPEZIL HCL 10 MG PO TABS: 10.0000 mg | ORAL_TABLET | Freq: Every day | ORAL | 3 refills | Status: DC

## 2020-03-05 NOTE — Patient Instructions (Signed)
Good to see you!  Continue Donepezil 10mg daily.  Follow-up in 6-8 months, call for any changes.  FALL PRECAUTIONS: Be cautious when walking. Scan the area for obstacles that may increase the risk of trips and falls. When getting up in the mornings, sit up at the edge of the bed for a few minutes before getting out of bed. Consider elevating the bed at the head end to avoid drop of blood pressure when getting up. Walk always in a well-lit room (use night lights in the walls). Avoid area rugs or power cords from appliances in the middle of the walkways. Use a walker or a cane if necessary and consider physical therapy for balance exercise. Get your eyesight checked regularly.  HOME SAFETY: Consider the safety of the kitchen when operating appliances like stoves, microwave oven, and blender. Consider having supervision and share cooking responsibilities until no longer able to participate in those. Accidents with firearms and other hazards in the house should be identified and addressed as well.  ABILITY TO BE LEFT ALONE: If patient is unable to contact 911 operator, consider using LifeLine, or when the need is there, arrange for someone to stay with patients. Smoking is a fire hazard, consider supervision or cessation. Risk of wandering should be assessed by caregiver and if detected at any point, supervision and safe proof recommendations should be instituted.  MEDICATION SUPERVISION: Inability to self-administer medication needs to be constantly addressed. Implement a mechanism to ensure safe administration of the medications.  RECOMMENDATIONS FOR ALL PATIENTS WITH MEMORY PROBLEMS: 1. Continue to exercise (Recommend 30 minutes of walking everyday, or 3 hours every week) 2. Increase social interactions - continue going to Church and enjoy social gatherings with friends and family 3. Eat healthy, avoid fried foods and eat more fruits and vegetables 4. Maintain adequate blood pressure, blood sugar, and  blood cholesterol level. Reducing the risk of stroke and cardiovascular disease also helps promoting better memory. 5. Avoid stressful situations. Live a simple life and avoid aggravations. Organize your time and prepare for the next day in anticipation. 6. Sleep well, avoid any interruptions of sleep and avoid any distractions in the bedroom that may interfere with adequate sleep quality 7. Avoid sugar, avoid sweets as there is a strong link between excessive sugar intake, diabetes, and cognitive impairment The Mediterranean diet has been shown to help patients reduce the risk of progressive memory disorders and reduces cardiovascular risk. This includes eating fish, eat fruits and green leafy vegetables, nuts like almonds and hazelnuts, walnuts, and also use olive oil. Avoid fast foods and fried foods as much as possible. Avoid sweets and sugar as sugar use has been linked to worsening of memory function.  There is always a concern of gradual progression of memory problems. If this is the case, then we may need to adjust level of care according to patient needs. Support, both to the patient and caregiver, should then be put into place.  

## 2020-03-05 NOTE — Progress Notes (Signed)
NEUROLOGY FOLLOW UP OFFICE NOTE  Carrie Hall [id number] [birth year]  HISTORY OF PRESENT ILLNESS: I had the pleasure of seeing Carrie Hall in follow-up in the neurology clinic on 03/05/2020.  The patient was last seen 6 months ago for dementia. She is again accompanied by her son Carrie Hall who helps supplement the history today.  Records and images were personally reviewed where available. MMSE 20/30 in 08/2019. She is on Donepezil 10mg  daily without side effects. Since her last visit, she feels her memory is pretty good. Her son agrees that she is doing good. She continues to live alone. Her other son visits daily and eats dinner with her. She continues to manage her own medications and denies missing frequently, her son checks behind her and agrees. Her son manages finances. She does not drive. She continues to cook and denies leaving the stove on. She is independent with dressing and bathing, no hygiene concerns. No paranoia or hallucinations. Sleep is "not too good," she takes naps occasionally. No wandering behavior. She has occasional leg cramps and has to get up and takes mustard, which helps immediately. She denies any headaches, dizziness, vision changes, focal numbness/tingling/weakness, no falls. No neck/back pain.    History on Initial Assessment 11/01/2017: This is an 86 year old right-handed woman with a history of hyperlipidemia, hypothyroidism, breast cancer, presenting for evaluation of worsening memory. When asked about her memory, she states she "forgets sometimes." Her daughter-in-law appears hesitant to provide additional information saying she does not want to hurt her feelings, but notes she repeats herself several times the same day. She lives alone, they report she cooks and cleans, no hygiene concerns. Her son has been managing bills for the past 5 years since the patient's husband passed away. She fills her own pillbox and states she forgets a dose once in a while. Her family does  not monitor medications so they cannot confirm this. She notes word-finding difficulties. She does not drive. She denies misplacing things frequently or leaving the stove on. She seems more anxious and has prn Xanax, but does not take this regularly. She took a dose last night and this morning nervous about today's visit. Her maternal grandmother, several cousins, and mother had Alzheimer's dementia. No history of significant head injuries or alcohol intake. She had an MMSE at PCP office in April 2017 which was 21/30 and was started on Donepezil but appears to have stopped it, she does not recall any side effects. Last MMSE at PCP office a few days ago was 84/30. She had an MRI brain without contrast in 08/2015 which I personally reviewed, no acute changes, there was mild to moderate chronic microvascular disease and mild diffuse atrophy.  She denies any headaches, dizziness, vision changes, dysarthria/dysphagia, neck/back pain, focal numbness/tingling/weakness. She has constipation but would get diarrhea with spicy foods.   PAST MEDICAL HISTORY: Past Medical History:  Diagnosis Date  . Adenomatous colon polyp   . Breast cancer ([place])   . COPD (chronic obstructive pulmonary disease) ([place])   . Diverticulosis   . Hyperlipidemia   . Internal hemorrhoids   . Osteopenia   . Thyroid disease     MEDICATIONS: Current Outpatient Medications on File Prior to Visit  Medication Sig Dispense Refill  . albuterol (VENTOLIN HFA) 108 (90 Base) MCG/ACT inhaler Inhale 2 puffs into the lungs every 4 (four) hours as needed for wheezing or shortness of breath. 54 g 1  . alendronate (FOSAMAX) 70 MG tablet Take 1 tablet (70 mg  total) by mouth every 7 (seven) days. Take with a full glass of water on an empty stomach. 12 tablet 3  . ALPRAZolam (XANAX) 0.25 MG tablet Take 1 tablet (0.25 mg total) by mouth at bedtime as needed for anxiety. 30 tablet 0  . atorvastatin (LIPITOR) 10 MG tablet Take 1 tablet (10 mg total) by  mouth daily. 90 tablet 1  . donepezil (ARICEPT) 10 MG tablet Take 1 tablet (10 mg total) by mouth at bedtime. 90 tablet 3  . fluticasone-salmeterol (ADVAIR HFA) 115-21 MCG/ACT inhaler Inhale 2 puffs into the lungs 2 (two) times daily. 1 Inhaler 12  . folic acid (FOLVITE) 1 MG tablet Take 1 mg by mouth daily.    Marland Kitchen levothyroxine (SYNTHROID) 88 MCG tablet Take 1 tablet (88 mcg total) by mouth daily before breakfast. 90 tablet 1  . methotrexate 50 MG/2ML injection SMARTSIG:0.6 Milliliter(s) IM Once a Week     No current facility-administered medications on file prior to visit.    ALLERGIES: Allergies  Allergen Reactions  . Pneumococcal Vaccines     FAMILY HISTORY: Family History  Problem Relation Age of Onset  . Alzheimer's disease Mother   . Cancer Father        lung  . Alzheimer's disease Maternal Grandmother   . Rectal cancer Sister        in her 16s    SOCIAL HISTORY: Social History   Socioeconomic History  . Marital status: Widowed    Spouse name: Not on file  . Number of children: Not on file  . Years of education: Not on file  . Highest education level: Not on file  Occupational History  . Not on file  Tobacco Use  . Smoking status: Former Smoker    Packs/day: 0.50    Types: Cigarettes    Quit date: 12/06/2003    Years since quitting: 16.2  . Smokeless tobacco: Never Used  Vaping Use  . Vaping Use: Never used  Substance and Sexual Activity  . Alcohol use: No  . Drug use: No  . Sexual activity: Never    Partners: Male  Other Topics Concern  . 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 Difficulty of Paying Living Expenses: Not hard at all  Food Insecurity: No Food Insecurity  . Worried About Charity fundraiser in the Last Year: Never true   . Ran Out of Food in the Last Year: Never true  Transportation Needs: No Transportation Needs  . Lack of Transportation (Medical): No  . Lack of Transportation (Non-Medical): No  Physical Activity:   . Days of Exercise per Week: Not on file  . Minutes of Exercise per Session: Not on file  Stress: Stress Concern Present  . Feeling of Stress : To some extent  Social Connections:   . Frequency of Communication with Friends and Family: Not on file  . Frequency of Social Gatherings with Friends and Family: Not on file  . Attends Religious Services: Not on file  . Active Member of Clubs or Organizations: Not on file  . Attends Archivist Meetings: Not on file  . Marital Status: Not on file  Intimate Partner Violence:   .  Fear of Current or Ex-Partner: Not on file  . Emotionally Abused: Not on file  . Physically Abused: Not on file  . Sexually Abused: Not on file     PHYSICAL EXAM: Vitals:   03/05/20 0826  BP: 102/68  Pulse: 68  SpO2: 92%   General: No acute distress Head:  Normocephalic/atraumatic Skin/Extremities: No rash, no edema Neurological Exam: alert and oriented to person, place, and time. No aphasia or dysarthria. Fund of knowledge is appropriate.  Recent and remote memory are intact.  Attention and concentration are reduced. MMSE 23/30 MMSE - Mini Mental State Exam 03/05/2020 09/02/2019 11/01/2017  Orientation to time 4 3 5   Orientation to Place 4 4 4   Registration 3 3 3   Attention/ Calculation 2 0 3  Recall 3 3 1   Language- name 2 objects 2 2 2   Language- repeat 0 1 1  Language- follow 3 step command 3 2 3   Language- read & follow direction 1 1 1   Write a sentence 0 0 1  Copy design 1 1 1   Total score 23 20 25      Cranial nerves: Pupils equal, round. Extraocular movements intact with no nystagmus. Visual fields full.  No facial asymmetry.  Motor: Bulk and tone normal, muscle strength 5/5 throughout with no pronator drift.   Finger to nose testing  intact.  Gait slightly wide-based, no ataxia   IMPRESSION: This is an 86 yo RH woman with a history of hyperlipidemia, hypothyroidism, breast cancer, with mild dementia. MMSE today 23/30 (20/30 in 08/2019, [place] 14/30 in February 2020, 25/30 in June 2019). She continues to do well overall, symptoms stable, no behavioral or home safety issues. Family closely monitors. She does not drive. Continue Donepezil 10mg  daily. Follow-up in 6-8 months, they know to call for any changes.    Thank you for allowing me to participate in her care.  Please do not hesitate to call for any questions or concerns.   Ellouise Newer, M.D.   CC: Dr. Cheri Rous

## 2020-03-09 ENCOUNTER — Telehealth: Payer: Self-pay | Admitting: Pharmacist

## 2020-03-09 NOTE — Progress Notes (Addendum)
Chronic Care Management Pharmacy Assistant   Name: CORAH WILLEFORD  MRN: [id number] DOB: 1933-11-21  Reason for Encounter: Medication Review  Patient Questions:  1.  Have you seen any other providers since your last visit? No  2.  Any changes in your medicines or health? No  PCP : [name], DO   Their chronic conditions include: COPD, HTN, HLD, Hypothyroidism, Anxiety, Osteoporosis, Memory Loss, Psorasis  Allergies:   Allergies  Allergen Reactions  . Pneumococcal Vaccines     Medications: Outpatient Encounter Medications as of 03/09/2020  Medication Sig  . albuterol (VENTOLIN HFA) 108 (90 Base) MCG/ACT inhaler Inhale 2 puffs into the lungs every 4 (four) hours as needed for wheezing or shortness of breath.  Marland Kitchen alendronate (FOSAMAX) 70 MG tablet Take 1 tablet (70 mg total) by mouth every 7 (seven) days. Take with a full glass of water on an empty stomach.  . ALPRAZolam (XANAX) 0.25 MG tablet Take 1 tablet (0.25 mg total) by mouth at bedtime as needed for anxiety.  Marland Kitchen atorvastatin (LIPITOR) 10 MG tablet Take 1 tablet (10 mg total) by mouth daily.  Marland Kitchen donepezil (ARICEPT) 10 MG tablet Take 1 tablet (10 mg total) by mouth at bedtime.  . fluticasone-salmeterol (ADVAIR HFA) 115-21 MCG/ACT inhaler Inhale 2 puffs into the lungs 2 (two) times daily.  . folic acid (FOLVITE) 1 MG tablet Take 1 mg by mouth daily.  Marland Kitchen levothyroxine (SYNTHROID) 88 MCG tablet Take 1 tablet (88 mcg total) by mouth daily before breakfast.  . methotrexate 50 MG/2ML injection SMARTSIG:0.6 Milliliter(s) IM Once a Week   No facility-administered encounter medications on file as of 03/09/2020.    Current Diagnosis: Patient Active Problem List   Diagnosis Date Noted  . Psoriasis 11/18/2019  . Essential hypertension 02/12/2018  . Generalized anxiety disorder 01/01/2018  . Mild cognitive impairment 11/10/2017  . Elevated LFTs 10/04/2017  . Loss of weight 10/04/2017  . Loose stools 10/04/2017  . Weight  loss, non-intentional 08/07/2017  . Preventative health care 08/07/2017  . Cerumen impaction 08/25/2015  . 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  . Memory loss 08/25/2015  . Diarrhea 12/19/2014  . KNEE PAIN, LEFT 05/27/2010  . DYSURIA 05/27/2010  . POSTMENOPAUSAL STATUS 04/23/2010  . CERVICAL POLYP 10/26/2009  . HEMATURIA, HX OF 10/26/2009  . DIZZINESS 06/11/2009  . FOOT, PAIN 11/17/2008  . OTHER ABNORMAL BLOOD CHEMISTRY 11/17/2008  . TINEA CRURIS 09/15/2008  . UTI 09/15/2008  . COLONIC POLYPS, ADENOMATOUS, HX OF 01/16/2008  . HEMOCCULT POSITIVE STOOL 12/11/2007  . BREAST CANCER, HX OF 12/11/2007  . Hypothyroidism 12/07/2007  . Hyperlipidemia LDL goal <100 12/07/2007  . COPD with chronic bronchitis ([place]) 12/07/2007  . Disorder of bone and articular cartilage 12/07/2007  . URI 03/13/2007  . HX, URINARY INFECTION 01/09/2007    Goals Addressed   None    Reviewed chart for medication changes ahead of medication coordination call.  03-05-20(Neurology) Patient presented in office with Ellouise Newer.  No medication changes.  No OVs or hospital visits since last care coordination call/Pharmacist visit.  No medication changes indicated OR if recent visit, treatment plan here.  BP Readings from Last 3 Encounters:  03/05/20 102/68  11/18/19 138/68  09/02/19 124/60    Lab Results  Component Value Date   HGBA1C 6.1 07/20/2012     Patient obtains medications through Vials  90 Days   Last adherence delivery included:  Alprazolam 0.25mg ; one tab PRN  Advair inhaler 115/21; inhale two  puffs into the lungs twice a day   Ventolin Inhaler; two puffs every four hours as needed  Patient declined the following medications last month due to PRN use/additional supply on hand.  Alprazolam 0.25mg ; one tab PRN   Clobetasol 0.05% topical creamPRN  Donepezil 10mg ; one tab at bedtimehas enough on hand  Advair inhaler 115/21; inhale two puffs into the lungs twice a day    Ventolin Inhaler; two puffs every four hours as needed  Patient is due for next adherence delivery on: 03-13-20 Called patient and reviewed medications and coordinated delivery.  This delivery to include:  Alprazolam 0.25mg ; one tab PRN  Atorvastatin 10mg : one tab withBreakfast   Patient declined the following medications meds due to (reason)  Advair inhaler 115/21; inhale two puffs into the lungs twice a day   Ventolin Inhaler; two puffs every four hours as needed  Levothyroxine 41mcg; one tab atBreakfast  Folic Acid 1mg ; one tab at Breakfast  Alendronate 70mg ; one tab on Monday weekly Clobetasol 0.05% topical creamPRN  Donepezil 10mg ; one tab at bedtimehas enough on hand  Methotrexate Injection 25mg /ml; inject 0.16ml every week as directed  Clobetasol 0.05% topical creamPRN  Confirmed delivery date of 03-13-2020, advised patient that pharmacy will contact them the morning of delivery.  Follow-Up:  Comptroller and Pharmacist Review   [place] Shannon, [place] [facility] [place] [phone]  Reviewed by: De Blanch, PharmD Clinical Pharmacist [facility] [phone]

## 2020-03-10 ENCOUNTER — Other Ambulatory Visit: Payer: Self-pay

## 2020-03-10 DIAGNOSIS — F411 Generalized anxiety disorder: Secondary | ICD-10-CM

## 2020-03-10 MED ORDER — ALPRAZOLAM 0.25 MG PO TABS: 0.2500 mg | ORAL_TABLET | Freq: Every evening | ORAL | 0 refills | Status: DC | PRN

## 2020-03-10 NOTE — Telephone Encounter (Signed)
-----   Message from [place] N Shannon sent at 03/09/2020  4:40 PM EDT ----- Regarding: refill request Patient needs refill for alprazolam 0.25 mg

## 2020-03-10 NOTE — Telephone Encounter (Signed)
Requesting: Xanax Contract: 01/01/2018 UDS: 01/01/2018, low risk Last OV: 11/18/19 Next OV: 05/22/20 Last Refill: 02/12/20, #30--0 RF Database:   Please advise

## 2020-03-11 DIAGNOSIS — L408 Other psoriasis: Secondary | ICD-10-CM | POA: Diagnosis not present

## 2020-03-18 DIAGNOSIS — L408 Other psoriasis: Secondary | ICD-10-CM | POA: Diagnosis not present

## 2020-03-23 ENCOUNTER — Other Ambulatory Visit: Payer: Self-pay

## 2020-03-23 ENCOUNTER — Ambulatory Visit (INDEPENDENT_AMBULATORY_CARE_PROVIDER_SITE_OTHER): Payer: Medicare HMO | Admitting: Family Medicine

## 2020-03-23 ENCOUNTER — Encounter: Payer: Self-pay | Admitting: Family Medicine

## 2020-03-23 VITALS — BP 140/80 | HR 69 | Temp 99.0°F | Resp 18 | Ht 64.0 in | Wt 147.4 lb

## 2020-03-23 DIAGNOSIS — Z23 Encounter for immunization: Secondary | ICD-10-CM | POA: Diagnosis not present

## 2020-03-23 DIAGNOSIS — R1310 Dysphagia, unspecified: Secondary | ICD-10-CM | POA: Diagnosis not present

## 2020-03-23 DIAGNOSIS — G309 Alzheimer's disease, unspecified: Secondary | ICD-10-CM

## 2020-03-23 DIAGNOSIS — F0280 Dementia in other diseases classified elsewhere without behavioral disturbance: Secondary | ICD-10-CM

## 2020-03-23 DIAGNOSIS — R69 Illness, unspecified: Secondary | ICD-10-CM | POA: Diagnosis not present

## 2020-03-23 MED ORDER — FAMOTIDINE 20 MG PO TABS
20.0000 mg | ORAL_TABLET | Freq: Two times a day (BID) | ORAL | 1 refills | Status: DC
Start: 2020-03-23 — End: 2020-06-29

## 2020-03-23 MED ORDER — DONEPEZIL HCL 10 MG PO TBDP: 10.0000 mg | ORAL_TABLET | Freq: Every day | ORAL | 3 refills | Status: DC

## 2020-03-23 NOTE — Progress Notes (Signed)
Patient ID: Carrie Hall, female    DOB: [birth year]  Age: 86 y.o. MRN: [id number]    Subjective:  Subjective  HPI  [name] UTT presents for dysphagia x 1 week.  She is having trouble swallowing pills and food.  It feels like it won't go down.  No other complaints   Review of Systems  Constitutional: Negative for appetite change, diaphoresis, fatigue and unexpected weight change.  Eyes: Negative for pain, redness and visual disturbance.  Respiratory: Negative for cough, chest tightness, shortness of breath and wheezing.   Cardiovascular: Negative for chest pain, palpitations and leg swelling.  Endocrine: Negative for cold intolerance, heat intolerance, polydipsia, polyphagia and polyuria.  Genitourinary: Negative for difficulty urinating, dysuria and frequency.  Neurological: Negative for dizziness, light-headedness, numbness and headaches.    History Past Medical History:  Diagnosis Date  . Adenomatous colon polyp   . Breast cancer ([place])   . COPD (chronic obstructive pulmonary disease) ([place])   . Diverticulosis   . Hyperlipidemia   . Internal hemorrhoids   . Osteopenia   . Thyroid disease     She has a past surgical history that includes colon polyps; Breast surgery; and Breast lumpectomy (Left).   Her family history includes Alzheimer's disease in her maternal grandmother and mother; Cancer in her father; Rectal cancer in her sister.She reports that she quit smoking about 16 years ago. Her smoking use included cigarettes. She smoked 0.50 packs per day. She has never used smokeless tobacco. She reports that she does not drink alcohol and does not use drugs.  Current Outpatient Medications on File Prior to Visit  Medication Sig Dispense Refill  . albuterol (VENTOLIN HFA) 108 (90 Base) MCG/ACT inhaler Inhale 2 puffs into the lungs every 4 (four) hours as needed for wheezing or shortness of breath. 54 g 1  . alendronate (FOSAMAX) 70 MG tablet Take 1 tablet (70 mg total) by mouth  every 7 (seven) days. Take with a full glass of water on an empty stomach. 12 tablet 3  . ALPRAZolam (XANAX) 0.25 MG tablet Take 1 tablet (0.25 mg total) by mouth at bedtime as needed for anxiety. 30 tablet 0  . atorvastatin (LIPITOR) 10 MG tablet Take 1 tablet (10 mg total) by mouth daily. 90 tablet 1  . fluticasone-salmeterol (ADVAIR HFA) 115-21 MCG/ACT inhaler Inhale 2 puffs into the lungs 2 (two) times daily. 1 Inhaler 12  . folic acid (FOLVITE) 1 MG tablet Take 1 mg by mouth daily.    Marland Kitchen levothyroxine (SYNTHROID) 88 MCG tablet Take 1 tablet (88 mcg total) by mouth daily before breakfast. 90 tablet 1  . methotrexate 50 MG/2ML injection SMARTSIG:0.6 Milliliter(s) IM Once a Week     No current facility-administered medications on file prior to visit.     Objective:  Objective  Physical Exam Vitals and nursing note reviewed.  Constitutional:      Appearance: She is well-developed.  HENT:     Head: Normocephalic and atraumatic.  Eyes:     Conjunctiva/sclera: Conjunctivae normal.  Neck:     Thyroid: No thyromegaly.     Vascular: No carotid bruit or JVD.  Cardiovascular:     Rate and Rhythm: Normal rate and regular rhythm.     Heart sounds: Normal heart sounds. No murmur heard.   Pulmonary:     Effort: Pulmonary effort is normal. No respiratory distress.     Breath sounds: Normal breath sounds. No wheezing or rales.  Chest:     Chest wall:  No tenderness.  Musculoskeletal:     Cervical back: Normal range of motion and neck supple.  Neurological:     Mental Status: She is alert and oriented to person, place, and time.  Psychiatric:        Mood and Affect: Mood normal.        Behavior: Behavior normal.        Thought Content: Thought content normal.        Judgment: Judgment normal.    BP 140/80 (BP Location: Left Arm, Patient Position: Sitting, Cuff Size: Normal)   Pulse 69   Temp 99 F (37.2 C) (Oral)   Resp 18   Ht 5\' 4"  (1.626 m)   Wt 147 lb 6.4 oz (66.9 kg)   SpO2  97%   BMI 25.30 kg/m  Wt Readings from Last 3 Encounters:  03/23/20 147 lb 6.4 oz (66.9 kg)  03/05/20 148 lb 3.2 oz (67.2 kg)  11/18/19 148 lb (67.1 kg)     Lab Results  Component Value Date   WBC 5.7 11/18/2019   HGB 13.5 11/18/2019   HCT 39.5 11/18/2019   PLT 249.0 11/18/2019   GLUCOSE 102 (H) 11/18/2019   CHOL 175 11/18/2019   TRIG 113.0 11/18/2019   HDL 59.80 11/18/2019   LDLDIRECT 103.3 03/07/2014   LDLCALC 93 11/18/2019   ALT 11 11/18/2019   AST 14 11/18/2019   NA 140 11/18/2019   K 4.4 11/18/2019   CL 100 11/18/2019   CREATININE 0.66 11/18/2019   BUN 11 11/18/2019   CO2 34 (H) 11/18/2019   TSH 1.15 11/18/2019   HGBA1C 6.1 07/20/2012    No results found.   Assessment & Plan:  Plan  I have discontinued Ica H. Loll's donepezil. I am also having her start on donepezil and famotidine. Additionally, I am having her maintain her folic acid, Advair HFA, methotrexate, albuterol, alendronate, levothyroxine, atorvastatin, and ALPRAZolam.  Meds ordered this encounter  Medications  . donepezil (ARICEPT ODT) 10 MG disintegrating tablet    Sig: Take 1 tablet (10 mg total) by mouth at bedtime.    Dispense:  90 tablet    Refill:  3  . famotidine (PEPCID) 20 MG tablet    Sig: Take 1 tablet (20 mg total) by mouth 2 (two) times daily.    Dispense:  180 tablet    Refill:  1    Problem List Items Addressed This Visit    None    Visit Diagnoses    Alzheimer's dementia without behavioral disturbance, unspecified timing of dementia onset ([place])    -  Primary   Relevant Medications   donepezil (ARICEPT ODT) 10 MG disintegrating tablet   Dysphagia, unspecified type       Relevant Orders   Ambulatory referral to Gastroenterology   Need for influenza vaccination       Relevant Orders   Flu Vaccine QUAD High Dose([facility])      Follow-up: Return in about 3 months (around 06/23/2020), or if symptoms worsen or fail to improve, for hypertension, hyperlipidemia, annual exam,  fasting.  [name], DO

## 2020-03-23 NOTE — Patient Instructions (Addendum)
covid booster available at the [facility] ---- 9am -3pm Mon, tues, Friday-- no appointment needed just bring your card      Dysphagia  Dysphagia is trouble swallowing. This condition occurs when solids and liquids stick in a person's throat on the way down to the stomach, or when food takes longer to get to the stomach than usual. You may have problems swallowing food, liquids, or both. You may also have pain while trying to swallow. It may take you more time and effort to swallow something. What are the causes? This condition may be caused by:  Muscle problems. They may make it difficult for you to move food and liquids through the esophagus, which is the tube that connects your mouth to your stomach.  Blockages. You may have ulcers, scar tissue, or inflammation that blocks the normal passage of food and liquids. Causes of these problems include: ? Acid reflux from your stomach into your esophagus (gastroesophageal reflux). ? Infections. ? Radiation treatment for cancer. ? Medicines taken without enough fluids to wash them down into your stomach.  Stroke. This can affect the nerves and make it difficult to swallow.  Nerve problems. These prevent signals from being sent to the muscles of your esophagus to squeeze (contract) and move what you swallow down to your stomach.  Globus pharyngeus. This is a common problem that involves a feeling like something is stuck in your throat or a sense of trouble with swallowing, even though nothing is wrong with the swallowing passages.  Certain conditions, such as cerebral palsy or Parkinson's disease. What are the signs or symptoms? Common symptoms of this condition include:  A feeling that solids or liquids are stuck in your throat on the way down to the stomach.  Pain while swallowing.  Coughing or gagging while trying to swallow. Other symptoms include:  Food moving back from your stomach to your mouth  (regurgitation).  Noises coming from your throat.  Chest discomfort with swallowing.  A feeling of fullness when swallowing.  Drooling, especially when the throat is blocked.  Heartburn. How is this diagnosed? This condition may be diagnosed by:  Barium X-ray. In this test, you will swallow a white liquid that sticks to the inside of your esophagus. X-ray images are then taken.  Endoscopy. In this test, a flexible telescope is inserted down your throat to look at your esophagus and your stomach.  CT scans and an MRI. How is this treated? Treatment for dysphagia depends on the cause of this condition, such as:  If the dysphagia is caused by acid reflux or infection, medicines may be used. They may include antibiotics and heartburn medicines.  If the dysphagia is caused by problems with the muscles, swallowing therapy may be used to help you strengthen your swallowing muscles. You may have to do specific exercises to strengthen the muscles or stretch them.  If the dysphagia is caused by a blockage or mass, procedures to remove the blockage may be done. You may need surgery and a feeding tube. You may need to make diet changes. Ask your health care provider for specific instructions. Follow these instructions at home: Medicines  Take over-the-counter and prescription medicines only as told by your health care provider.  If you were prescribed an antibiotic medicine, take it as told by your health care provider. Do not stop taking the antibiotic even if you start to feel better. Eating and drinking   Follow any diet changes as told by  your health care provider.  Work with a diet and nutrition specialist (dietitian) to create an eating plan that will help you get the nutrients you need in order to stay healthy.  Eat soft foods that are easier to swallow.  Cut your food into small pieces and eat slowly. Take small bites.  Eat and drink only when you are sitting upright.  Do  not drink alcohol or caffeine. If you need help quitting, ask your health care provider. General instructions  Check your weight every day to make sure you are not losing weight.  Do not use any products that contain nicotine or tobacco, such as cigarettes, e-cigarettes, and chewing tobacco. If you need help quitting, ask your health care provider.  Keep all follow-up visits as told by your health care provider. This is important. Contact a health care provider if you:  Lose weight because you cannot swallow.  Cough when you drink liquids.  Cough up partially digested food. Get help right away if you:  Cannot swallow your saliva.  Have shortness of breath, a fever, or both.  Have a hoarse voice and also have trouble swallowing. Summary  Dysphagia is trouble swallowing. This condition occurs when solids and liquids stick in a person's throat on the way down to the stomach. You may cough or gag while trying to swallow.  Dysphagia has many possible causes.  Treatment for dysphagia depends on the cause of the condition.  Keep all follow-up visits as told by your health care provider. This is important. This information is not intended to replace advice given to you by your health care provider. Make sure you discuss any questions you have with your health care provider. Document Revised: 09/19/2018 Document Reviewed: 09/19/2018 Elsevier Patient Education  [place].

## 2020-03-25 DIAGNOSIS — L408 Other psoriasis: Secondary | ICD-10-CM | POA: Diagnosis not present

## 2020-04-01 DIAGNOSIS — L408 Other psoriasis: Secondary | ICD-10-CM | POA: Diagnosis not present

## 2020-04-02 ENCOUNTER — Other Ambulatory Visit: Payer: Self-pay | Admitting: Family Medicine

## 2020-04-02 DIAGNOSIS — E039 Hypothyroidism, unspecified: Secondary | ICD-10-CM

## 2020-04-06 ENCOUNTER — Telehealth: Payer: Self-pay | Admitting: Pharmacist

## 2020-04-06 DIAGNOSIS — L408 Other psoriasis: Secondary | ICD-10-CM | POA: Diagnosis not present

## 2020-04-06 NOTE — Progress Notes (Addendum)
Chronic Care Management Pharmacy Assistant   Name: Carrie Hall  MRN: [id number] DOB: 10-21-1933  Reason for Encounter: Medication Review  Patient Questions:  1.  Have you seen any other providers since your last visit? Yes  2.  Any changes in your medicines or health? Yes  PCP : [name], DO   Their chronic conditions include: COPD, HTN, HLD, Hypothyroidism, Anxiety, Osteoporosis, Memory Loss, Psorasis  Allergies:   Allergies  Allergen Reactions   Pneumococcal Vaccines     Medications: Outpatient Encounter Medications as of 04/06/2020  Medication Sig   albuterol (VENTOLIN HFA) 108 (90 Base) MCG/ACT inhaler Inhale 2 puffs into the lungs every 4 (four) hours as needed for wheezing or shortness of breath.   alendronate (FOSAMAX) 70 MG tablet Take 1 tablet (70 mg total) by mouth every 7 (seven) days. Take with a full glass of water on an empty stomach.   ALPRAZolam (XANAX) 0.25 MG tablet Take 1 tablet (0.25 mg total) by mouth at bedtime as needed for anxiety.   atorvastatin (LIPITOR) 10 MG tablet Take 1 tablet (10 mg total) by mouth daily.   donepezil (ARICEPT ODT) 10 MG disintegrating tablet Take 1 tablet (10 mg total) by mouth at bedtime.   famotidine (PEPCID) 20 MG tablet Take 1 tablet (20 mg total) by mouth 2 (two) times daily.   fluticasone-salmeterol (ADVAIR HFA) 115-21 MCG/ACT inhaler Inhale 2 puffs into the lungs 2 (two) times daily.   folic acid (FOLVITE) 1 MG tablet Take 1 mg by mouth daily.   levothyroxine (SYNTHROID) 88 MCG tablet TAKE ONE TABLET BY MOUTH BEFORE BREAKFAST   methotrexate 50 MG/2ML injection SMARTSIG:0.6 Milliliter(s) IM Once a Week   No facility-administered encounter medications on file as of 04/06/2020.    Current Diagnosis: Patient Active Problem List   Diagnosis Date Noted   Psoriasis 11/18/2019   Essential hypertension 02/12/2018   Generalized anxiety disorder 01/01/2018   Mild cognitive impairment 11/10/2017   Elevated  LFTs 10/04/2017   Loss of weight 10/04/2017   Loose stools 10/04/2017   Weight loss, non-intentional 08/07/2017   Preventative health care 08/07/2017   Cerumen impaction 08/25/2015   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   Memory loss 08/25/2015   Diarrhea 12/19/2014   KNEE PAIN, LEFT 05/27/2010   DYSURIA 05/27/2010   POSTMENOPAUSAL STATUS 04/23/2010   CERVICAL POLYP 10/26/2009   HEMATURIA, HX OF 10/26/2009   DIZZINESS 06/11/2009   FOOT, PAIN 11/17/2008   OTHER ABNORMAL BLOOD CHEMISTRY 11/17/2008   TINEA CRURIS 09/15/2008   UTI 09/15/2008   COLONIC POLYPS, ADENOMATOUS, HX OF 01/16/2008   HEMOCCULT POSITIVE STOOL 12/11/2007   BREAST CANCER, HX OF 12/11/2007   Hypothyroidism 12/07/2007   Hyperlipidemia LDL goal <100 12/07/2007   COPD with chronic bronchitis ([place]) 12/07/2007   Disorder of bone and articular cartilage 12/07/2007   URI 03/13/2007   HX, URINARY INFECTION 01/09/2007    Goals Addressed   None    03-23-20(PCP) Patient presented in office with Carrie Hall for a follow up.  Provider prescribed famotidine 20 mg  No consults, or hospital visits since last care coordination call.  BP Readings from Last 3 Encounters:  03/23/20 140/80  03/05/20 102/68  11/18/19 138/68    Lab Results  Component Value Date   HGBA1C 6.1 07/20/2012     Patient obtains medications through Vials  90 Days   Last adherence delivery included:  Alprazolam 0.25mg ; one tab PRN Atorvastatin 10mg : one tab with Breakfast  Patient declined (meds) last month due to fill not needed at time Advair inhaler 115/21; inhale two puffs into the lungs twice a day  Ventolin Inhaler; two puffs every four hours as needed  Levothyroxine 39mcg; one tab at Breakfast Folic Acid 1mg ; one tab at Breakfast Alendronate 70mg ; one tab on Monday weekly Clobetasol 0.05% topical cream PRN Donepezil 10mg ; one tab at bedtime has enough on hand Methotrexate Injection 25mg /ml; inject 0.1ml every week as  directed Clobetasol 0.05% topical cream PRN  Patient is due for next adherence delivery on: 04-10-20. Called patient and reviewed medications and coordinated delivery.  This delivery to include: Levothyroxine 88 mcg; one tab at breakfast Folic Acid 1 mg; one tab at breakfast Alendronate 70 mg; one tab on Monday weekly Donepezil 10 mg; one tab at bedtime Methotrexate injection 25 mg/ml; 0.6 ml injected every week Atorvastatin 10mg : one tab with Breakfast  Famotidine 20 mg; one tab twice daily  Patient declined the following medications due to PRN use. Advair inhaler 115/21; inhale two puffs into the lungs twice a day  Ventolin Inhaler; two puffs every four hours as needed  Clobetasol 0.05% topical cream PRN Alprazolam 0.25mg ; one tab PRN  Patient needs refills of methotrexate.  Confirmed delivery date of 04-10-20, advised patient that pharmacy will contact them the morning of delivery.  Follow-Up:  Comptroller and Pharmacist Review   Thailand Shannon, [place] [facility] [place] [phone]  Noted pt didn't request refill of advair. Will need to assure adherence.  Will coordinate to have methotrexate sent to [facility].  Reviewed by: De Blanch, PharmD, BCACP Clinical Pharmacist [place] [facility] at [facility] [phone]

## 2020-04-08 ENCOUNTER — Telehealth: Payer: Self-pay | Admitting: Pharmacist

## 2020-04-08 DIAGNOSIS — L408 Other psoriasis: Secondary | ICD-10-CM | POA: Diagnosis not present

## 2020-04-08 NOTE — Progress Notes (Addendum)
Chronic Care Management Pharmacy Assistant   Name: Carrie Hall  MRN: [id number] DOB: July 08, 1933  Reason for Encounter: Medication Review    PCP : [name], DO  Allergies:   Allergies  Allergen Reactions   Pneumococcal Vaccines     Medications: Outpatient Encounter Medications as of 04/08/2020  Medication Sig   albuterol (VENTOLIN HFA) 108 (90 Base) MCG/ACT inhaler Inhale 2 puffs into the lungs every 4 (four) hours as needed for wheezing or shortness of breath.   alendronate (FOSAMAX) 70 MG tablet Take 1 tablet (70 mg total) by mouth every 7 (seven) days. Take with a full glass of water on an empty stomach.   ALPRAZolam (XANAX) 0.25 MG tablet Take 1 tablet (0.25 mg total) by mouth at bedtime as needed for anxiety.   atorvastatin (LIPITOR) 10 MG tablet Take 1 tablet (10 mg total) by mouth daily.   donepezil (ARICEPT ODT) 10 MG disintegrating tablet Take 1 tablet (10 mg total) by mouth at bedtime.   famotidine (PEPCID) 20 MG tablet Take 1 tablet (20 mg total) by mouth 2 (two) times daily.   fluticasone-salmeterol (ADVAIR HFA) 115-21 MCG/ACT inhaler Inhale 2 puffs into the lungs 2 (two) times daily.   folic acid (FOLVITE) 1 MG tablet Take 1 mg by mouth daily.   levothyroxine (SYNTHROID) 88 MCG tablet TAKE ONE TABLET BY MOUTH BEFORE BREAKFAST   methotrexate 50 MG/2ML injection SMARTSIG:0.6 Milliliter(s) IM Once a Week   No facility-administered encounter medications on file as of 04/08/2020.    Current Diagnosis: Patient Active Problem List   Diagnosis Date Noted   Psoriasis 11/18/2019   Essential hypertension 02/12/2018   Generalized anxiety disorder 01/01/2018   Mild cognitive impairment 11/10/2017   Elevated LFTs 10/04/2017   Loss of weight 10/04/2017   Loose stools 10/04/2017   Weight loss, non-intentional 08/07/2017   Preventative health care 08/07/2017   Cerumen impaction 08/25/2015   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   Memory loss 08/25/2015    Diarrhea 12/19/2014   KNEE PAIN, LEFT 05/27/2010   DYSURIA 05/27/2010   POSTMENOPAUSAL STATUS 04/23/2010   CERVICAL POLYP 10/26/2009   HEMATURIA, HX OF 10/26/2009   DIZZINESS 06/11/2009   FOOT, PAIN 11/17/2008   OTHER ABNORMAL BLOOD CHEMISTRY 11/17/2008   TINEA CRURIS 09/15/2008   UTI 09/15/2008   COLONIC POLYPS, ADENOMATOUS, HX OF 01/16/2008   HEMOCCULT POSITIVE STOOL 12/11/2007   BREAST CANCER, HX OF 12/11/2007   Hypothyroidism 12/07/2007   Hyperlipidemia LDL goal <100 12/07/2007   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 Patient stated she had been using the Advair inhaler only once a day before she went to bed. While on the call, I could hear slight wheezing in the patient's voice. She stated there were 57 puffs left on her Advair Inhaler. Reminded her that the instructions are to use the inhaler twice a day, and it was to help her be able to breathe better. She stated she would use the inhaler in the morning after she took her pills as well as in the evening before bed. I informed her that I would review her medication in about a month, and she should need a new Advair inhaler at that  time if she is using as instructed.  Follow-Up:  Pharmacist Review   Fanny Skates, [place] Pharmacist Assistant [phone]  12 minutes in review and coordination Noted that adherence to Advair is important. Want to prevent exacerbation.  Reviewed by: De Blanch, PharmD, BCACP Clinical Pharmacist [facility] at [facility] [phone]

## 2020-04-09 DIAGNOSIS — Z961 Presence of intraocular lens: Secondary | ICD-10-CM | POA: Diagnosis not present

## 2020-04-09 DIAGNOSIS — H35033 Hypertensive retinopathy, bilateral: Secondary | ICD-10-CM | POA: Diagnosis not present

## 2020-04-09 DIAGNOSIS — H353132 Nonexudative age-related macular degeneration, bilateral, intermediate dry stage: Secondary | ICD-10-CM | POA: Diagnosis not present

## 2020-04-09 DIAGNOSIS — H35373 Puckering of macula, bilateral: Secondary | ICD-10-CM | POA: Diagnosis not present

## 2020-04-15 DIAGNOSIS — L408 Other psoriasis: Secondary | ICD-10-CM | POA: Diagnosis not present

## 2020-04-22 DIAGNOSIS — L408 Other psoriasis: Secondary | ICD-10-CM | POA: Diagnosis not present

## 2020-05-07 ENCOUNTER — Ambulatory Visit: Payer: Medicare HMO | Admitting: Internal Medicine

## 2020-05-07 DIAGNOSIS — L408 Other psoriasis: Secondary | ICD-10-CM | POA: Diagnosis not present

## 2020-05-11 ENCOUNTER — Telehealth: Payer: Self-pay | Admitting: Pharmacist

## 2020-05-11 NOTE — Progress Notes (Addendum)
Chronic Care Management Pharmacy Assistant   Name: LEXISS LAMKIN  MRN: [id number] DOB: 13-Aug-1933  Reason for Encounter: Medication Review   PCP : [name], DO  Allergies:   Allergies  Allergen Reactions   Pneumococcal Vaccines     Medications: Outpatient Encounter Medications as of 05/11/2020  Medication Sig   albuterol (VENTOLIN HFA) 108 (90 Base) MCG/ACT inhaler Inhale 2 puffs into the lungs every 4 (four) hours as needed for wheezing or shortness of breath.   alendronate (FOSAMAX) 70 MG tablet Take 1 tablet (70 mg total) by mouth every 7 (seven) days. Take with a full glass of water on an empty stomach.   ALPRAZolam (XANAX) 0.25 MG tablet Take 1 tablet (0.25 mg total) by mouth at bedtime as needed for anxiety.   atorvastatin (LIPITOR) 10 MG tablet Take 1 tablet (10 mg total) by mouth daily.   donepezil (ARICEPT ODT) 10 MG disintegrating tablet Take 1 tablet (10 mg total) by mouth at bedtime.   famotidine (PEPCID) 20 MG tablet Take 1 tablet (20 mg total) by mouth 2 (two) times daily.   fluticasone-salmeterol (ADVAIR HFA) 115-21 MCG/ACT inhaler Inhale 2 puffs into the lungs 2 (two) times daily.   folic acid (FOLVITE) 1 MG tablet Take 1 mg by mouth daily.   levothyroxine (SYNTHROID) 88 MCG tablet TAKE ONE TABLET BY MOUTH BEFORE BREAKFAST   methotrexate 50 MG/2ML injection SMARTSIG:0.6 Milliliter(s) IM Once a Week   No facility-administered encounter medications on file as of 05/11/2020.    Current Diagnosis: Patient Active Problem List   Diagnosis Date Noted   Psoriasis 11/18/2019   Essential hypertension 02/12/2018   Generalized anxiety disorder 01/01/2018   Mild cognitive impairment 11/10/2017   Elevated LFTs 10/04/2017   Loss of weight 10/04/2017   Loose stools 10/04/2017   Weight loss, non-intentional 08/07/2017   Preventative health care 08/07/2017   Cerumen impaction 08/25/2015   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   Memory loss 08/25/2015    Diarrhea 12/19/2014   KNEE PAIN, LEFT 05/27/2010   DYSURIA 05/27/2010   POSTMENOPAUSAL STATUS 04/23/2010   CERVICAL POLYP 10/26/2009   HEMATURIA, HX OF 10/26/2009   DIZZINESS 06/11/2009   FOOT, PAIN 11/17/2008   OTHER ABNORMAL BLOOD CHEMISTRY 11/17/2008   TINEA CRURIS 09/15/2008   UTI 09/15/2008   COLONIC POLYPS, ADENOMATOUS, HX OF 01/16/2008   HEMOCCULT POSITIVE STOOL 12/11/2007   BREAST CANCER, HX OF 12/11/2007   Hypothyroidism 12/07/2007   Hyperlipidemia LDL goal <100 12/07/2007   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  No OVs, Consults, or hospital visits since last care coordination call.  No medication changes indicated  BP Readings from Last 3 Encounters:  03/23/20 140/80  03/05/20 102/68  11/18/19 138/68    Lab Results  Component Value Date   HGBA1C 6.1 07/20/2012     Patient obtains medications through Vials  90 Days   Last adherence delivery included:  Levothyroxine 88 mcg; one tab at breakfast Folic Acid 1 mg; one tab at breakfast Alendronate 70 mg; one tab on Monday weekly Donepezil 10 mg; one tab at bedtime Methotrexate injection 25 mg/ml; 0.6 ml injected every week Atorvastatin 10mg : one tab with Breakfast  Famotidine 20 mg; one tab twice daily  Patient declined  the following medications due to PRN use: Ventolin Inhaler; two puffs every four hours as needed  Clobetasol 0.05% topical cream PRN Alprazolam 0.25mg ; one tab PRN Advair inhaler 115/21; inhale two puffs into the lungs twice a day (patient hadn't been using properly)  Patient is due for next adherence delivery on: 05-13-2020. Called patient and reviewed medications and coordinated delivery.  This delivery to include: Advair inhaler 115/21; inhale two puffs into the  lungs twice a day   Patient declined the following medications due to receiving a 90DS on 04-10-2020. Levothyroxine 88 mcg; one tab at breakfast Folic Acid 1 mg; one tab at breakfast Alendronate 70 mg; one tab on Monday weekly Donepezil 10 mg; one tab at bedtime Methotrexate injection 25 mg/ml; 0.6 ml injected every week Atorvastatin 10mg : one tab with Breakfast  Famotidine 20 mg; one tab twice daily  Patient does not need any refills at this time.  Confirmed delivery date of 05-13-2020, advised patient that pharmacy will contact them the morning of delivery.  Follow-Up:  Pharmacist Review   07-11-2020, [facility] Clinical Pharmacist Assistant [phone]  3 minutes spent in review, coordination, and documentation.   Reviewed by: [phone], PharmD, BCACP Clinical Pharmacist [facility] at [facility] [phone]

## 2020-05-13 ENCOUNTER — Telehealth: Payer: Self-pay | Admitting: Family Medicine

## 2020-05-13 ENCOUNTER — Encounter: Payer: Self-pay | Admitting: Family Medicine

## 2020-05-13 DIAGNOSIS — L408 Other psoriasis: Secondary | ICD-10-CM | POA: Diagnosis not present

## 2020-05-13 NOTE — Telephone Encounter (Signed)
Caller [name]  Call Back # [phone]  Patient is requesting that she would like her recent lab result be faxed to Dr. Marquis Lunch @ [phone] (fax)

## 2020-05-13 NOTE — Telephone Encounter (Signed)
Last labs from 11/2019- however results sent.

## 2020-05-18 ENCOUNTER — Ambulatory Visit: Payer: Medicare HMO | Admitting: Internal Medicine

## 2020-05-21 ENCOUNTER — Other Ambulatory Visit: Payer: Self-pay

## 2020-05-22 ENCOUNTER — Encounter: Payer: Self-pay | Admitting: Family Medicine

## 2020-05-22 ENCOUNTER — Ambulatory Visit (INDEPENDENT_AMBULATORY_CARE_PROVIDER_SITE_OTHER): Payer: Medicare HMO | Admitting: Family Medicine

## 2020-05-22 VITALS — BP 108/70 | HR 100 | Temp 98.7°F | Resp 18 | Ht 64.0 in | Wt 145.0 lb

## 2020-05-22 DIAGNOSIS — E785 Hyperlipidemia, unspecified: Secondary | ICD-10-CM | POA: Diagnosis not present

## 2020-05-22 DIAGNOSIS — Z1231 Encounter for screening mammogram for malignant neoplasm of breast: Secondary | ICD-10-CM | POA: Diagnosis not present

## 2020-05-22 DIAGNOSIS — E2839 Other primary ovarian failure: Secondary | ICD-10-CM

## 2020-05-22 DIAGNOSIS — E039 Hypothyroidism, unspecified: Secondary | ICD-10-CM

## 2020-05-22 DIAGNOSIS — Z0000 Encounter for general adult medical examination without abnormal findings: Secondary | ICD-10-CM | POA: Diagnosis not present

## 2020-05-22 LAB — CBC WITH DIFFERENTIAL/PLATELET
Basophils Absolute: 0 10*3/uL (ref 0.0–0.1)
Basophils Relative: 0.8 % (ref 0.0–3.0)
Eosinophils Absolute: 0 10*3/uL (ref 0.0–0.7)
Eosinophils Relative: 0.5 % (ref 0.0–5.0)
HCT: 40 % (ref 36.0–46.0)
Hemoglobin: 13.4 g/dL (ref 12.0–15.0)
Lymphocytes Relative: 20.4 % (ref 12.0–46.0)
Lymphs Abs: 1 10*3/uL (ref 0.7–4.0)
MCHC: 33.4 g/dL (ref 30.0–36.0)
MCV: 92.2 fl (ref 78.0–100.0)
Monocytes Absolute: 1 10*3/uL (ref 0.1–1.0)
Monocytes Relative: 20.1 % — ABNORMAL HIGH (ref 3.0–12.0)
Neutro Abs: 2.8 10*3/uL (ref 1.4–7.7)
Neutrophils Relative %: 58.2 % (ref 43.0–77.0)
Platelets: 200 10*3/uL (ref 150.0–400.0)
RBC: 4.34 Mil/uL (ref 3.87–5.11)
RDW: 14.5 % (ref 11.5–15.5)
WBC: 4.8 10*3/uL (ref 4.0–10.5)

## 2020-05-22 LAB — LIPID PANEL
Cholesterol: 179 mg/dL (ref 0–200)
HDL: 58.4 mg/dL (ref >39.00)
LDL Cholesterol: 105 mg/dL — ABNORMAL HIGH (ref 0–99)
NonHDL: 120.74
Total CHOL/HDL Ratio: 3
Triglycerides: 81 mg/dL (ref 0.0–149.0)
VLDL: 16.2 mg/dL (ref 0.0–40.0)

## 2020-05-22 LAB — COMPREHENSIVE METABOLIC PANEL
ALT: 17 U/L (ref 0–35)
AST: 22 U/L (ref 0–37)
Albumin: 4.2 g/dL (ref 3.5–5.2)
Alkaline Phosphatase: 71 U/L (ref 39–117)
BUN: 13 mg/dL (ref 6–23)
CO2: 33 mEq/L — ABNORMAL HIGH (ref 19–32)
Calcium: 8.9 mg/dL (ref 8.4–10.5)
Chloride: 99 mEq/L (ref 96–112)
Creatinine, Ser: 0.71 mg/dL (ref 0.40–1.20)
GFR: 78.09 mL/min (ref >60.00)
Glucose, Bld: 101 mg/dL — ABNORMAL HIGH (ref 70–99)
Potassium: 3.9 mEq/L (ref 3.5–5.1)
Sodium: 138 mEq/L (ref 135–145)
Total Bilirubin: 0.7 mg/dL (ref 0.2–1.2)
Total Protein: 6.3 g/dL (ref 6.0–8.3)

## 2020-05-22 LAB — TSH: TSH: 1.34 u[IU]/mL (ref 0.35–4.50)

## 2020-05-22 NOTE — Progress Notes (Signed)
Subjective:     Carrie Hall is a 87 y.o. female and is here for a comprehensive physical exam. The patient reports no problems.  Social History   Socioeconomic History  . Marital status: Widowed    Spouse name: Not on file  . Number of children: Not on file  . Years of education: Not on file  . Highest education level: Not on file  Occupational History  . Not on file  Tobacco Use  . Smoking status: Former Smoker    Packs/day: 0.50    Types: Cigarettes    Quit date: 12/06/2003    Years since quitting: 16.4  . Smokeless tobacco: Never Used  Vaping Use  . Vaping Use: Never used  Substance and Sexual Activity  . Alcohol use: No  . Drug use: No  . Sexual activity: Never    Partners: Male  Other Topics Concern  . 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 Difficulty of Paying Living Expenses: Not hard at all  Food Insecurity: No Food Insecurity  . Worried About Charity fundraiser in the Last Year: Never true  . Ran Out of Food in the Last Year: Never true  Transportation Needs: No Transportation Needs  . Lack of Transportation (Medical): No  . Lack of Transportation (Non-Medical): No  Physical Activity: Not on file  Stress: Stress Concern Present  . Feeling of Stress : To some extent  Social Connections: Not on file  Intimate Partner Violence: Not on file   Health Maintenance  Topic Date Due  . MAMMOGRAM  03/13/2018  . COVID-19 Vaccine (3 - Booster for Pfizer series) 02/25/2020  . TETANUS/TDAP  08/08/2027  . INFLUENZA VACCINE  Completed  . DEXA SCAN  Completed  . PNA vac Low Risk Adult  Completed    The following portions of the patient's history were reviewed and updated as appropriate:  She  has a past medical history of Adenomatous colon  polyp, Breast cancer ([place]), COPD (chronic obstructive pulmonary disease) ([place]), Diverticulosis, Hyperlipidemia, Internal hemorrhoids, Osteopenia, and Thyroid disease. She does not have any pertinent problems on file. She  has a past surgical history that includes colon polyps; Breast surgery; and Breast lumpectomy (Left). Her family history includes Alzheimer's disease in her maternal grandmother and mother; Cancer in her father; Rectal cancer in her sister. She  reports that she quit smoking about 16 years ago. Her smoking use included cigarettes. She smoked 0.50 packs per day. She has never used smokeless tobacco. She reports that she does not drink alcohol and does not use drugs. She has a current medication list which includes the following prescription(s): albuterol, alendronate, alprazolam, atorvastatin, donepezil, famotidine, advair hfa, folic acid, levothyroxine, and methotrexate. Current Outpatient Medications on File Prior to Visit  Medication Sig Dispense Refill  . albuterol (VENTOLIN HFA) 108 (90 Base) MCG/ACT inhaler Inhale 2 puffs into the lungs every 4 (four) hours as needed for wheezing or shortness of breath. 54 g 1  . alendronate (FOSAMAX) 70 MG tablet Take 1 tablet (70 mg total) by mouth every 7 (seven) days. Take with a full glass of water on an empty stomach. 12 tablet 3  . ALPRAZolam (XANAX) 0.25  MG tablet Take 1 tablet (0.25 mg total) by mouth at bedtime as needed for anxiety. 30 tablet 0  . atorvastatin (LIPITOR) 10 MG tablet Take 1 tablet (10 mg total) by mouth daily. 90 tablet 1  . donepezil (ARICEPT ODT) 10 MG disintegrating tablet Take 1 tablet (10 mg total) by mouth at bedtime. 90 tablet 3  . famotidine (PEPCID) 20 MG tablet Take 1 tablet (20 mg total) by mouth 2 (two) times daily. 180 tablet 1  . fluticasone-salmeterol (ADVAIR HFA) 115-21 MCG/ACT inhaler Inhale 2 puffs into the lungs 2 (two) times daily. 1 Inhaler 12  . folic acid (FOLVITE) 1 MG tablet Take 1 mg by mouth  daily.    Marland Kitchen levothyroxine (SYNTHROID) 88 MCG tablet TAKE ONE TABLET BY MOUTH BEFORE BREAKFAST 90 tablet 1  . methotrexate 50 MG/2ML injection SMARTSIG:0.6 Milliliter(s) IM Once a Week     No current facility-administered medications on file prior to visit.   She is allergic to pneumococcal vaccines..  Review of Systems Review of Systems  Constitutional: Negative for activity change, appetite change and fatigue.  HENT: Negative for hearing loss, congestion, tinnitus and ear discharge.  dentist q14m Eyes: Negative for visual disturbance (see optho q1y -- vision corrected to 20/20 with glasses).  Respiratory: Negative for cough, chest tightness and shortness of breath.   Cardiovascular: Negative for chest pain, palpitations and leg swelling.  Gastrointestinal: Negative for abdominal pain, diarrhea, constipation and abdominal distention.  Genitourinary: Negative for urgency, frequency, decreased urine volume and difficulty urinating.  Musculoskeletal: Negative for back pain, arthralgias and gait problem.  Skin: Negative for color change, pallor and rash.  Neurological: Negative for dizziness, light-headedness, numbness and headaches.  Hematological: Negative for adenopathy. Does not bruise/bleed easily.  Psychiatric/Behavioral: Negative for suicidal ideas, confusion, sleep disturbance, self-injury, dysphoric mood, decreased concentration and agitation.       Objective:    BP 108/70 (BP Location: Right Arm, Patient Position: Sitting, Cuff Size: Normal)   Pulse 100   Temp 98.7 F (37.1 C) (Oral)   Resp 18   Ht 5\' 4"  (1.626 m)   Wt 145 lb (65.8 kg)   SpO2 93%   BMI 24.89 kg/m  General appearance: alert, cooperative, appears stated age and no distress Head: Normocephalic, without obvious abnormality, atraumatic Eyes: negative findings: lids and lashes normal, conjunctivae and sclerae normal and pupils equal, round, reactive to light and accomodation Ears: normal TM's and external ear  canals both ears Neck: no adenopathy, no carotid bruit, no JVD, supple, symmetrical, trachea midline and thyroid not enlarged, symmetric, no tenderness/mass/nodules Back: symmetric, no curvature. ROM normal. No CVA tenderness. Lungs: clear to auscultation bilaterally Heart: regular rate and rhythm, S1, S2 normal, no murmur, click, rub or gallop Abdomen: soft, non-tender; bowel sounds normal; no masses,  no organomegaly Pelvic: not indicated; post-menopausal, no abnormal Pap smears in past Extremities: extremities normal, atraumatic, no cyanosis or edema Pulses: 2+ and symmetric Skin: Skin color, texture, turgor normal. No rashes or lesions Lymph nodes: Cervical, supraclavicular, and axillary nodes normal. Neurologic: Alert and oriented X 3, normal strength and tone. Normal symmetric reflexes. Normal coordination and gait    Assessment:    Healthy female exam.      Plan:    ghm utd Check labs  See After Visit Summary for Counseling Recommendations    1. Hypothyroidism, unspecified type Check lab con't synthroid  - TSH  2. Hyperlipidemia, unspecified hyperlipidemia type Encouraged heart healthy diet, increase exercise, avoid trans fats, consider a krill  oil cap daily - TSH - Lipid panel - CBC with Differential/Platelet - Comprehensive metabolic panel  3. Encounter for screening mammogram for malignant neoplasm of breast   - MM DIGITAL SCREENING BILATERAL; Future  4. Estrogen deficiency   - DG Bone Density; Future  5. Preventative health care See above

## 2020-05-22 NOTE — Patient Instructions (Signed)
Preventive Care 85 Years and Older, Female Preventive care refers to lifestyle choices and visits with your health care provider that can promote health and wellness. This includes:  A yearly physical exam. This is also called an annual wellness visit.  Regular dental and eye exams.  Immunizations.  Screening for certain conditions.  Healthy lifestyle choices, such as: ? Eating a healthy diet. ? Getting regular exercise. ? Not using drugs or products that contain nicotine and tobacco. ? Limiting alcohol use. What can I expect for my preventive care visit? Physical exam Your health care provider will check your:  Height and weight. These may be used to calculate your BMI (body mass index). BMI is a measurement that tells if you are at a healthy weight.  Heart rate and blood pressure.  Body temperature.  Skin for abnormal spots. Counseling Your health care provider may ask you questions about your:  Past medical problems.  Family's medical history.  Alcohol, tobacco, and drug use.  Emotional well-being.  Home life and relationship well-being.  Sexual activity.  Diet, exercise, and sleep habits.  History of falls.  Memory and ability to understand (cognition).  Work and work Statistician.  Pregnancy and menstrual history.  Access to firearms. What immunizations do I need? Vaccines are usually given at various ages, according to a schedule. Your health care provider will recommend vaccines for you based on your age, medical history, and lifestyle or other factors, such as travel or where you work.   What tests do I need? Blood tests  Lipid and cholesterol levels. These may be checked every 5 years, or more often depending on your overall health.  Hepatitis C test.  Hepatitis B test. Screening  Lung cancer screening. You may have this screening every year starting at age 85 if you have a 30-pack-year history of smoking and currently smoke or have quit within  the past 15 years.  Colorectal cancer screening. ? All adults should have this screening starting at age 85 and continuing until age 85. ? Your health care provider may recommend screening at age 85 if you are at increased risk. ? You will have tests every 1-10 years, depending on your results and the type of screening test.  Diabetes screening. ? This is done by checking your blood sugar (glucose) after you have not eaten for a while (fasting). ? You may have this done every 1-3 years.  Mammogram. ? This may be done every 1-2 years. ? Talk with your health care provider about how often you should have regular mammograms.  Abdominal aortic aneurysm (AAA) screening. You may need this if you are a current or former smoker.  BRCA-related cancer screening. This may be done if you have a family history of breast, ovarian, tubal, or peritoneal cancers. Other tests  STD (sexually transmitted disease) testing, if you are at risk.  Bone density scan. This is done to screen for osteoporosis. You may have this done starting at age 85. Talk with your health care provider about your test results, treatment options, and if necessary, the need for more tests. Follow these instructions at home: Eating and drinking  Eat a diet that includes fresh fruits and vegetables, whole grains, lean protein, and low-fat dairy products. Limit your intake of foods with high amounts of sugar, saturated fats, and salt.  Take vitamin and mineral supplements as recommended by your health care provider.  Do not drink alcohol if your health care provider tells you not to drink.  If you drink alcohol: ? Limit how much you have to 0-1 drink a day. ? Be aware of how much alcohol is in your drink. In the [place] one drink equals one 12 oz bottle of beer (355 mL), one 5 oz glass of wine (148 mL), or one 1 oz glass of hard liquor (44 mL).   Lifestyle  Take daily care of your teeth and gums. Brush your teeth every morning  and night with fluoride toothpaste. Floss one time each day.  Stay active. Exercise for at least 30 minutes 5 or more days each week.  Do not use any products that contain nicotine or tobacco, such as cigarettes, e-cigarettes, and chewing tobacco. If you need help quitting, ask your health care provider.  Do not use drugs.  If you are sexually active, practice safe sex. Use a condom or other form of protection in order to prevent STIs (sexually transmitted infections).  Talk with your health care provider about taking a low-dose aspirin or statin.  Find healthy ways to cope with stress, such as: ? Meditation, yoga, or listening to music. ? Journaling. ? Talking to a trusted person. ? Spending time with friends and family. Safety  Always wear your seat belt while driving or riding in a vehicle.  Do not drive: ? If you have been drinking alcohol. Do not ride with someone who has been drinking. ? When you are tired or distracted. ? While texting.  Wear a helmet and other protective equipment during sports activities.  If you have firearms in your house, make sure you follow all gun safety procedures. What's next?  Visit your health care provider once a year for an annual wellness visit.  Ask your health care provider how often you should have your eyes and teeth checked.  Stay up to date on all vaccines. This information is not intended to replace advice given to you by your health care provider. Make sure you discuss any questions you have with your health care provider. Document Revised: 04/15/2020 Document Reviewed: 04/19/2018 Elsevier Patient Education  2021 [employer]vier Inc.  

## 2020-05-27 DIAGNOSIS — L408 Other psoriasis: Secondary | ICD-10-CM | POA: Diagnosis not present

## 2020-05-28 ENCOUNTER — Encounter (HOSPITAL_BASED_OUTPATIENT_CLINIC_OR_DEPARTMENT_OTHER): Payer: Self-pay | Admitting: Radiology

## 2020-05-28 ENCOUNTER — Emergency Department (HOSPITAL_BASED_OUTPATIENT_CLINIC_OR_DEPARTMENT_OTHER)
Admission: EM | Admit: 2020-05-28 | Discharge: 2020-05-28 | Disposition: A | Discharge status: Home / Self Care | Payer: Medicare HMO | Attending: Emergency Medicine | Admitting: Emergency Medicine

## 2020-05-28 ENCOUNTER — Other Ambulatory Visit: Payer: Self-pay

## 2020-05-28 ENCOUNTER — Emergency Department (HOSPITAL_BASED_OUTPATIENT_CLINIC_OR_DEPARTMENT_OTHER): Payer: Medicare HMO

## 2020-05-28 DIAGNOSIS — R0789 Other chest pain: Secondary | ICD-10-CM | POA: Diagnosis not present

## 2020-05-28 DIAGNOSIS — Z87891 Personal history of nicotine dependence: Secondary | ICD-10-CM | POA: Insufficient documentation

## 2020-05-28 DIAGNOSIS — U071 COVID-19: Secondary | ICD-10-CM | POA: Diagnosis not present

## 2020-05-28 DIAGNOSIS — Z853 Personal history of malignant neoplasm of breast: Secondary | ICD-10-CM | POA: Insufficient documentation

## 2020-05-28 DIAGNOSIS — R059 Cough, unspecified: Secondary | ICD-10-CM

## 2020-05-28 DIAGNOSIS — R0602 Shortness of breath: Secondary | ICD-10-CM | POA: Diagnosis not present

## 2020-05-28 DIAGNOSIS — J449 Chronic obstructive pulmonary disease, unspecified: Secondary | ICD-10-CM | POA: Diagnosis not present

## 2020-05-28 DIAGNOSIS — E039 Hypothyroidism, unspecified: Secondary | ICD-10-CM | POA: Insufficient documentation

## 2020-05-28 DIAGNOSIS — Z79899 Other long term (current) drug therapy: Secondary | ICD-10-CM | POA: Diagnosis not present

## 2020-05-28 DIAGNOSIS — I10 Essential (primary) hypertension: Secondary | ICD-10-CM | POA: Diagnosis not present

## 2020-05-28 DIAGNOSIS — Z7951 Long term (current) use of inhaled steroids: Secondary | ICD-10-CM | POA: Diagnosis not present

## 2020-05-28 DIAGNOSIS — R7989 Other specified abnormal findings of blood chemistry: Secondary | ICD-10-CM | POA: Diagnosis not present

## 2020-05-28 LAB — BASIC METABOLIC PANEL
Anion gap: 10 (ref 5–15)
BUN: 12 mg/dL (ref 8–23)
CO2: 29 mmol/L (ref 22–32)
Calcium: 8.8 mg/dL — ABNORMAL LOW (ref 8.9–10.3)
Chloride: 101 mmol/L (ref 98–111)
Creatinine, Ser: 0.67 mg/dL (ref 0.44–1.00)
GFR, Estimated: >60 mL/min (ref >60)
Glucose, Bld: 111 mg/dL — ABNORMAL HIGH (ref 70–99)
Potassium: 3.7 mmol/L (ref 3.5–5.1)
Sodium: 140 mmol/L (ref 135–145)

## 2020-05-28 LAB — D-DIMER, QUANTITATIVE: D-Dimer, Quant: 1.27 ug/mL-FEU — ABNORMAL HIGH (ref 0.00–0.50)

## 2020-05-28 LAB — CBC WITH DIFFERENTIAL/PLATELET
Abs Immature Granulocytes: 0.01 10*3/uL (ref 0.00–0.07)
Basophils Absolute: 0 10*3/uL (ref 0.0–0.1)
Basophils Relative: 0 %
Eosinophils Absolute: 0 10*3/uL (ref 0.0–0.5)
Eosinophils Relative: 1 %
HCT: 38.8 % (ref 36.0–46.0)
Hemoglobin: 13.1 g/dL (ref 12.0–15.0)
Immature Granulocytes: 0 %
Lymphocytes Relative: 18 %
Lymphs Abs: 1.1 10*3/uL (ref 0.7–4.0)
MCH: 31.4 pg (ref 26.0–34.0)
MCHC: 33.8 g/dL (ref 30.0–36.0)
MCV: 93 fL (ref 80.0–100.0)
Monocytes Absolute: 0.7 10*3/uL (ref 0.1–1.0)
Monocytes Relative: 12 %
Neutro Abs: 4.2 10*3/uL (ref 1.7–7.7)
Neutrophils Relative %: 69 %
Platelets: 159 10*3/uL (ref 150–400)
RBC: 4.17 MIL/uL (ref 3.87–5.11)
RDW: 13.9 % (ref 11.5–15.5)
WBC: 6.1 10*3/uL (ref 4.0–10.5)
nRBC: 0 % (ref 0.0–0.2)

## 2020-05-28 LAB — TROPONIN I (HIGH SENSITIVITY): Troponin I (High Sensitivity): 9 ng/L (ref <18)

## 2020-05-28 LAB — BRAIN NATRIURETIC PEPTIDE: B Natriuretic Peptide: 68.9 pg/mL (ref 0.0–100.0)

## 2020-05-28 LAB — SARS CORONAVIRUS 2 (TAT 6-24 HRS): SARS Coronavirus 2: POSITIVE — AB

## 2020-05-28 MED ORDER — IPRATROPIUM-ALBUTEROL 0.5-2.5 (3) MG/3ML IN SOLN
3.0000 mL | RESPIRATORY_TRACT | Status: AC
  Administered 2020-05-28 (×3): 3 mL via RESPIRATORY_TRACT
  Filled 2020-05-28: qty 9

## 2020-05-28 MED ORDER — IOHEXOL 350 MG/ML SOLN
100.0000 mL | Freq: Once | INTRAVENOUS | Status: AC | PRN
  Administered 2020-05-28: 75 mL via INTRAVENOUS

## 2020-05-28 MED ORDER — PREDNISONE 10 MG PO TABS: 40.0000 mg | ORAL_TABLET | Freq: Every day | ORAL | 0 refills | Status: AC

## 2020-05-28 MED ORDER — METHYLPREDNISOLONE SODIUM SUCC 125 MG IJ SOLR
125.0000 mg | Freq: Once | INTRAMUSCULAR | Status: AC
  Administered 2020-05-28: 125 mg via INTRAVENOUS
  Filled 2020-05-28: qty 2

## 2020-05-28 MED ORDER — AZITHROMYCIN 250 MG PO TABS
500.0000 mg | ORAL_TABLET | Freq: Once | ORAL | Status: AC
  Administered 2020-05-28: 500 mg via ORAL
  Filled 2020-05-28: qty 2

## 2020-05-28 MED ORDER — AZITHROMYCIN 250 MG PO TABS: 250.0000 mg | ORAL_TABLET | Freq: Every day | ORAL | 0 refills | Status: AC

## 2020-05-28 NOTE — ED Notes (Signed)
C/o cough x 1 week  Non productive,  No fever, no body aches

## 2020-05-28 NOTE — ED Provider Notes (Signed)
[facility] EMERGENCY DEPARTMENT Provider Note   CSN: [id number] Arrival date & time: 05/28/20  [id number]     History Chief Complaint  Patient presents with  . Cough    Carrie Hall is a 87 y.o. female.  Patient woke up earlier this week feeling foggy, that got better, today she woke up with a dry cough, she is concerned she might have bronchitis.  She has a history of COPD.  She has not tried any therapy to help.  The cough is moderate, no chest pain but feels something different about her chest.  Having a little more difficulty ambulating due to dyspnea.  Denies fevers chills, denies sick contacts, did get COVID-vaccine.  Tolerating p.o. no diarrhea no nausea vomiting.  Says she does not have chest pain just a different feeling, does not feel like previous COPD.        Past Medical History:  Diagnosis Date  . Adenomatous colon polyp   . Breast cancer ([place])   . COPD (chronic obstructive pulmonary disease) ([place])   . Diverticulosis   . Hyperlipidemia   . Internal hemorrhoids   . Osteopenia   . Thyroid disease     Patient Active Problem List   Diagnosis Date Noted  . Psoriasis 11/18/2019  . Essential hypertension 02/12/2018  . Generalized anxiety disorder 01/01/2018  . Mild cognitive impairment 11/10/2017  . Elevated LFTs 10/04/2017  . Loss of weight 10/04/2017  . Loose stools 10/04/2017  . Weight loss, non-intentional 08/07/2017  . Preventative health care 08/07/2017  . Cerumen impaction 08/25/2015  . 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  . Memory loss 08/25/2015  . Diarrhea 12/19/2014  . KNEE PAIN, LEFT 05/27/2010  . DYSURIA 05/27/2010  . POSTMENOPAUSAL STATUS 04/23/2010  . CERVICAL POLYP 10/26/2009  . HEMATURIA, HX OF 10/26/2009  . DIZZINESS 06/11/2009  . FOOT, PAIN 11/17/2008  . OTHER ABNORMAL BLOOD CHEMISTRY 11/17/2008  . TINEA CRURIS 09/15/2008  . UTI 09/15/2008  . COLONIC POLYPS, ADENOMATOUS, HX OF 01/16/2008  . HEMOCCULT POSITIVE STOOL  12/11/2007  . BREAST CANCER, HX OF 12/11/2007  . Hypothyroidism 12/07/2007  . Hyperlipidemia LDL goal <100 12/07/2007  . COPD with chronic bronchitis ([place]) 12/07/2007  . Disorder of bone and articular cartilage 12/07/2007  . URI 03/13/2007  . HX, URINARY INFECTION 01/09/2007    Past Surgical History:  Procedure Laterality Date  . BREAST LUMPECTOMY Left    radiation only  . BREAST SURGERY    . colon polyps       OB History   No obstetric history on file.     Family History  Problem Relation Age of Onset  . Alzheimer's disease Mother   . Cancer Father        lung  . Alzheimer's disease Maternal Grandmother   . Rectal cancer Sister        in her 78s    Social History   Tobacco Use  . Smoking status: Former Smoker    Packs/day: 0.50    Types: Cigarettes    Quit date: 12/06/2003    Years since quitting: 16.4  . Smokeless tobacco: Never Used  Vaping Use  . Vaping Use: Never used  Substance Use Topics  . Alcohol use: No  . Drug use: No    Home Medications Prior to Admission medications   Medication Sig Start Date End Date Taking? Authorizing Provider  azithromycin (ZITHROMAX) 250 MG tablet Take 1 tablet (250 mg total) by mouth daily for 4 days. Take  first 2 tablets together, then 1 every day until finished. 05/28/20 06/01/20 Yes Sabino Donovan, MD  predniSONE (DELTASONE) 10 MG tablet Take 4 tablets (40 mg total) by mouth daily with breakfast for 4 days. 05/28/20 06/01/20 Yes Sabino Donovan, MD  albuterol (VENTOLIN HFA) 108 (90 Base) MCG/ACT inhaler Inhale 2 puffs into the lungs every 4 (four) hours as needed for wheezing or shortness of breath. 10/15/19   Donato Schultz, DO  alendronate (FOSAMAX) 70 MG tablet Take 1 tablet (70 mg total) by mouth every 7 (seven) days. Take with a full glass of water on an empty stomach. 10/15/19   Donato Schultz, DO  ALPRAZolam (XANAX) 0.25 MG tablet Take 1 tablet (0.25 mg total) by mouth at bedtime as needed for anxiety. 03/10/20    Donato Schultz, DO  atorvastatin (LIPITOR) 10 MG tablet Take 1 tablet (10 mg total) by mouth daily. 12/18/19   Seabron Spates R, DO  donepezil (ARICEPT ODT) 10 MG disintegrating tablet Take 1 tablet (10 mg total) by mouth at bedtime. 03/23/20   Donato Schultz, DO  famotidine (PEPCID) 20 MG tablet Take 1 tablet (20 mg total) by mouth 2 (two) times daily. 03/23/20   Donato Schultz, DO  fluticasone-salmeterol (ADVAIR HFA) 379-02 MCG/ACT inhaler Inhale 2 puffs into the lungs 2 (two) times daily. 07/27/19   Donato Schultz, DO  folic acid (FOLVITE) 1 MG tablet Take 1 mg by mouth daily.    [provider]  levothyroxine (SYNTHROID) 88 MCG tablet TAKE ONE TABLET BY MOUTH BEFORE BREAKFAST 04/04/20   Donato Schultz, DO  methotrexate 50 MG/2ML injection SMARTSIG:0.6 Milliliter(s) IM Once a Week 06/21/19   [provider]    Allergies    Pneumococcal vaccines  Review of Systems   Review of Systems  Constitutional: Negative for chills and fever.  HENT: Negative for congestion and rhinorrhea.   Respiratory: Positive for cough and shortness of breath.   Cardiovascular: Negative for chest pain and palpitations.  Gastrointestinal: Negative for diarrhea, nausea and vomiting.  Genitourinary: Negative for difficulty urinating and dysuria.  Musculoskeletal: Negative for arthralgias and back pain.  Skin: Negative for rash and wound.  Neurological: Negative for light-headedness and headaches.    Physical Exam Updated Vital Signs BP (!) 141/58   Pulse 62   Temp (!) 97.1 F (36.2 C) (Oral)   Resp 17   Ht 5\' 4"  (1.626 m)   Wt 66.9 kg   SpO2 96%   BMI 25.32 kg/m   Physical Exam Vitals and nursing note reviewed. Exam conducted with a chaperone present.  Constitutional:      General: She is not in acute distress.    Appearance: Normal appearance.  HENT:     Head: Normocephalic and atraumatic.     Nose: No rhinorrhea.     Mouth/Throat:      Mouth: Mucous membranes are moist.  Eyes:     General:        Right eye: No discharge.        Left eye: No discharge.     Conjunctiva/sclera: Conjunctivae normal.  Cardiovascular:     Rate and Rhythm: Normal rate. Rhythm irregular.     Heart sounds: No murmur heard. No gallop.   Pulmonary:     Effort: Pulmonary effort is normal. No respiratory distress.     Breath sounds: No stridor. Rhonchi (faint throughout) present. No wheezing.  Abdominal:  General: Abdomen is flat. There is no distension.     Palpations: Abdomen is soft.     Tenderness: There is no abdominal tenderness.  Musculoskeletal:        General: No tenderness or signs of injury.  Skin:    General: Skin is warm and dry.  Neurological:     General: No focal deficit present.     Mental Status: She is alert. Mental status is at baseline.     Motor: No weakness.  Psychiatric:        Mood and Affect: Mood normal.        Behavior: Behavior normal.     ED Results / Procedures / Treatments   Labs (all labs ordered are listed, but only abnormal results are displayed) Labs Reviewed  D-DIMER, QUANTITATIVE (NOT AT [facility]) - Abnormal; Notable for the following components:      Result Value   D-Dimer, Quant 1.27 (*)    All other components within normal limits  BASIC METABOLIC PANEL - Abnormal; Notable for the following components:   Glucose, Bld 111 (*)    Calcium 8.8 (*)    All other components within normal limits  SARS CORONAVIRUS 2 (TAT 6-24 HRS)  CBC WITH DIFFERENTIAL/PLATELET  BRAIN NATRIURETIC PEPTIDE  TROPONIN I (HIGH SENSITIVITY)    EKG None  Radiology CT Angio Chest PE W and/or Wo Contrast  Result Date: 05/28/2020 CLINICAL DATA:  Shortness of breath and elevated D-dimer with 1 week of cough. EXAM: CT ANGIOGRAPHY CHEST WITH CONTRAST TECHNIQUE: Multidetector CT imaging of the chest was performed using the standard protocol during bolus administration of intravenous contrast. Multiplanar CT image  reconstructions and MIPs were obtained to evaluate the vascular anatomy. CONTRAST:  81mL OMNIPAQUE IOHEXOL 350 MG/ML SOLN COMPARISON:  Same day chest radiograph. Chest radiograph July 15, 2016 FINDINGS: Cardiovascular: Satisfactory opacification of the pulmonary arteries to the segmental level. No evidence of pulmonary embolism. Mild cardiac enlargement. Mitral annulus calcifications. Coronary artery calcifications. Aortic and branch vessel atherosclerosis. No pericardial effusion. Mediastinum/Nodes: No mediastinal, hilar or axillary adenopathy. Trachea esophagus are unremarkable. Lungs/Pleura: No focal consolidation. No suspicious pulmonary nodules or masses. No pleural effusion or pneumothorax. Upper Abdomen: No acute abnormality. Musculoskeletal: Multilevel degenerative changes spine. No acute osseous abnormality. No suspicious lytic or blastic lesion of bone. Review of the MIP images confirms the above findings. IMPRESSION: 1. No evidence of acute pulmonary embolism. No focal consolidation or pulmonary edema. 2. Coronary artery calcifications. 3. Aortic atherosclerosis. Aortic Atherosclerosis (ICD10-I70.0). Electronically Signed   By: Dahlia Bailiff MD   On: 05/28/2020 09:04   DG Chest Portable 1 View  Result Date: 05/28/2020 CLINICAL DATA:  Shortness of breath EXAM: PORTABLE CHEST 1 VIEW COMPARISON:  07/15/2016 FINDINGS: Cardiomegaly. Mild aortic tortuosity. There is no edema, consolidation, effusion, or pneumothorax. Large lung volumes with diaphragm flattening. IMPRESSION: 1. No acute finding. 2. Cardiomegaly and hyperinflation. Electronically Signed   By: Monte Fantasia M.D.   On: 05/28/2020 07:46    Procedures Procedures (including critical care time)  Medications Ordered in ED Medications  methylPREDNISolone sodium succinate (SOLU-MEDROL) 125 mg/2 mL injection 125 mg (125 mg Intravenous Given 05/28/20 0801)  ipratropium-albuterol (DUONEB) 0.5-2.5 (3) MG/3ML nebulizer solution 3 mL (3 mLs  Nebulization Given 05/28/20 0805)  iohexol (OMNIPAQUE) 350 MG/ML injection 100 mL (75 mLs Intravenous Contrast Given 05/28/20 0839)  azithromycin (ZITHROMAX) tablet 500 mg (500 mg Oral Given 05/28/20 1013)    ED Course  I have reviewed the triage vital signs and the  nursing notes.  Pertinent labs & imaging results that were available during my care of the patient were reviewed by me and considered in my medical decision making (see chart for details).    MDM Rules/Calculators/A&P                          Patient with history of COPD here with nonproductive cough, exertional dyspnea.  Concern for COPD exacerbation.  Has some difficulty lying flat, gets short of breath.  Concern for possible cardiac origin however more likely pulmonary with dry cough.  We will get screening cardiac biomarkers will get EKG Will get chest x-ray, will get other screening labs include CBC BMP and chemistry.  Will give breathing treatments and steroids and then reassess.  Will likely need viral testing as well but we will wait until we determine patient's disposition.  Patient's EKG shows sinus rhythm without acute ischemic change, premature atrial complexes, artifact in precordial leads but no ischemic changes.  Troponins negative.  BNP is negative.  Chest x-ray was unremarkable for any acute cardiopulmonary pathology.  D-dimer is markedly elevated so she had CT PE study was reviewed by radiology myself shows no acute cardiopulmonary pathology, she was given symptomatic control with steroids and breathing treatments and has marked improvement in symptoms and feels safe for discharge home.  COVID test sent and pending at time of discharge.  She sent home with prescription for prednisone and azithromycin.  Strict return precautions provided.  Family agrees to plan.  Final Clinical Impression(s) / ED Diagnoses Final diagnoses:  Cough  SOB (shortness of breath)    Rx / DC Orders ED Discharge Orders         Ordered     azithromycin (ZITHROMAX) 250 MG tablet  Daily        05/28/20 1029    predniSONE (DELTASONE) 10 MG tablet  Daily with breakfast        05/28/20 1029           Breck Coons, MD 05/28/20 1053

## 2020-05-28 NOTE — ED Triage Notes (Signed)
Pt states dry cough and throat since Monday. Unproductive. Pt has some shortness of breath starting this morning

## 2020-05-28 NOTE — Discharge Instructions (Addendum)
The Covid test is pending at time of discharge.  Instructions on how to follow this up on my chart are on your discharge paperwork, you can also call the department if you are having trouble finding these results.  If he/she is Covid positive he/she will need to be quarantine for total 5 days since the onset of symptoms +24 hours of no fever and resolving symptoms, additionally he/she needs to wear a mask near all others for 5 more days. If he/she is not Covid positive he/she is able to go back to normal day-to-day routine as long as he/she is not having fevers and it has been 24 hours since his/her last fever.

## 2020-05-29 ENCOUNTER — Encounter: Payer: Self-pay | Admitting: Family Medicine

## 2020-05-29 ENCOUNTER — Telehealth: Payer: Self-pay | Admitting: Family Medicine

## 2020-05-29 ENCOUNTER — Telehealth: Payer: Self-pay | Admitting: Unknown Physician Specialty

## 2020-05-29 NOTE — Telephone Encounter (Signed)
Patient found out last night she tested positive for covid  Please advice

## 2020-05-29 NOTE — Telephone Encounter (Signed)
Needs virtual visit- I believe Saturday clinic schedule is open.

## 2020-05-29 NOTE — Telephone Encounter (Signed)
Called to discuss with patient about COVID-19 symptoms and the use of one of the available treatments for those with mild to moderate Covid symptoms and at a high risk of hospitalization.  Pt appears to qualify for outpatient treatment due to co-morbid conditions and/or a member of an at-risk group in accordance with the FDA Emergency Use Authorization.    Symptom onset: Unsure as memory is poor.   Vaccinated: yes Booster? Qualifiers: age  Talked to pt and tried to call son to discuss tx.  Carrie Hall

## 2020-05-29 NOTE — Telephone Encounter (Signed)
That was done in the ER--- they would have informed her of this while she was there

## 2020-05-31 ENCOUNTER — Other Ambulatory Visit: Payer: Self-pay | Admitting: Family Medicine

## 2020-06-04 ENCOUNTER — Telehealth: Payer: Self-pay | Admitting: Pharmacist

## 2020-06-04 NOTE — Progress Notes (Addendum)
Chronic Care Management Pharmacy Assistant   Name: Carrie Hall  MRN: [id number] DOB: June 29, 1933  Reason for Encounter: Medication Review  PCP : [name], DO  Allergies:   Allergies  Allergen Reactions   Pneumococcal Vaccines     Medications: Outpatient Encounter Medications as of 06/04/2020  Medication Sig   albuterol (VENTOLIN HFA) 108 (90 Base) MCG/ACT inhaler Inhale 2 puffs into the lungs every 4 (four) hours as needed for wheezing or shortness of breath.   alendronate (FOSAMAX) 70 MG tablet Take 1 tablet (70 mg total) by mouth every 7 (seven) days. Take with a full glass of water on an empty stomach.   ALPRAZolam (XANAX) 0.25 MG tablet Take 1 tablet (0.25 mg total) by mouth at bedtime as needed for anxiety.   atorvastatin (LIPITOR) 10 MG tablet Take 1 tablet (10 mg total) by mouth at bedtime.   donepezil (ARICEPT ODT) 10 MG disintegrating tablet Take 1 tablet (10 mg total) by mouth at bedtime.   famotidine (PEPCID) 20 MG tablet Take 1 tablet (20 mg total) by mouth 2 (two) times daily.   fluticasone-salmeterol (ADVAIR HFA) 115-21 MCG/ACT inhaler Inhale 2 puffs into the lungs 2 (two) times daily.   folic acid (FOLVITE) 1 MG tablet Take 1 mg by mouth daily.   levothyroxine (SYNTHROID) 88 MCG tablet TAKE ONE TABLET BY MOUTH BEFORE BREAKFAST   methotrexate 50 MG/2ML injection SMARTSIG:0.6 Milliliter(s) IM Once a Week   No facility-administered encounter medications on file as of 06/04/2020.    Current Diagnosis: Patient Active Problem List   Diagnosis Date Noted   Psoriasis 11/18/2019   Essential hypertension 02/12/2018   Generalized anxiety disorder 01/01/2018   Mild cognitive impairment 11/10/2017   Elevated LFTs 10/04/2017   Loss of weight 10/04/2017   Loose stools 10/04/2017   Weight loss, non-intentional 08/07/2017   Preventative health care 08/07/2017   Cerumen impaction 08/25/2015   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   Memory loss 08/25/2015    Diarrhea 12/19/2014   KNEE PAIN, LEFT 05/27/2010   DYSURIA 05/27/2010   POSTMENOPAUSAL STATUS 04/23/2010   CERVICAL POLYP 10/26/2009   HEMATURIA, HX OF 10/26/2009   DIZZINESS 06/11/2009   FOOT, PAIN 11/17/2008   OTHER ABNORMAL BLOOD CHEMISTRY 11/17/2008   TINEA CRURIS 09/15/2008   UTI 09/15/2008   COLONIC POLYPS, ADENOMATOUS, HX OF 01/16/2008   HEMOCCULT POSITIVE STOOL 12/11/2007   BREAST CANCER, HX OF 12/11/2007   Hypothyroidism 12/07/2007   Hyperlipidemia LDL goal <100 12/07/2007   COPD with chronic bronchitis ([place]) 12/07/2007   Disorder of bone and articular cartilage 12/07/2007   URI 03/13/2007   HX, URINARY INFECTION 01/09/2007    Goals Addressed   None    Reviewed chart for medication changes ahead of medication coordination call.  No Consults since last care coordination call.  Office Visits: 05/22/20 [name], DO. Lab work taken. No medication changes.   Hospital: 05/28/20 Dr. Ron Parker. For cough. STARTED Azithromycin 250 mg and Prednisone 40 mg.   BP Readings from Last 3 Encounters:  05/28/20 (!) 141/58  05/22/20 108/70  03/23/20 140/80    Lab Results  Component Value Date   HGBA1C 6.1 07/20/2012     Patient obtains medications through Vials  90 Days   Last adherence delivery included:  Advair inhaler 115/21; inhale two puffs into the lungs twice a day  Patient declined meds last month due to receiving a 90DS on 04-10-2020. Levothyroxine 88 mcg; one tab at breakfast  Folic Acid 1 mg; one tab at breakfast Alendronate 70 mg; one tab on Monday weekly Donepezil 10 mg; one tab at bedtime Methotrexate injection 25 mg/ml; 0.6 ml injected every week Atorvastatin 10mg : one tab with Breakfast  Famotidine 20 mg; one tab twice daily   Patient is not due for adherence delivery at this time.  Called patient and reviewed medications and coordinated delivery.  This delivery to include: None  Patient declined the following medications: Levothyroxine  88 mcg; one tab at breakfast (enough on hand) Alprazolam 0.25 mg (enough on hand) Advair inhaler 115/21; inhale two puffs into the lungs twice a day (enough on hand) Clobetasol 0.05% Topical Cream (does not use this at this time) Ventolin HFA 90 MCG/ACT (enough on hand) Folic Acid 1 mg; one tab at breakfast (enough on hand) Alendronate 70 mg; one tab on Monday weekly (enough on hand)  Donepezil 10 mg; one tab at bedtime (enough on hand) Methotrexate injection 25 mg/ml; 0.6 ml injected every week (enough on hand) Atorvastatin 10mg : one tab with Breakfast (enough on hand) Famotidine 20 mg; one tab twice daily (enough on hand)  Patient does not need any refills at this time.  Patient stated taking PreserVision AREDS 2 times daily from OTC.   Follow-Up:  Coordination of Enhanced Pharmacy Services and Pharmacist Review   Charlann Lange, [place] Pharmacist Assistant [phone]  7 minutes spent in review, coordination, and documentation.  Reviewed by: Beverly Milch, PharmD Clinical Pharmacist [facility] [phone]

## 2020-06-10 DIAGNOSIS — L408 Other psoriasis: Secondary | ICD-10-CM | POA: Diagnosis not present

## 2020-06-17 DIAGNOSIS — L408 Other psoriasis: Secondary | ICD-10-CM | POA: Diagnosis not present

## 2020-06-24 DIAGNOSIS — L408 Other psoriasis: Secondary | ICD-10-CM | POA: Diagnosis not present

## 2020-06-27 ENCOUNTER — Other Ambulatory Visit: Payer: Self-pay | Admitting: Family Medicine

## 2020-07-01 DIAGNOSIS — L408 Other psoriasis: Secondary | ICD-10-CM | POA: Diagnosis not present

## 2020-07-02 ENCOUNTER — Telehealth: Payer: Self-pay | Admitting: Pharmacist

## 2020-07-02 NOTE — Progress Notes (Addendum)
Chronic Care Management Pharmacy Assistant   Name: MARKAYLA REICHART  MRN: [id number] DOB: Sep 23, 1933  Reason for Encounter: Medication Review  PCP : [name], DO  Allergies:   Allergies  Allergen Reactions   Pneumococcal Vaccines     Medications: Outpatient Encounter Medications as of 07/02/2020  Medication Sig   albuterol (VENTOLIN HFA) 108 (90 Base) MCG/ACT inhaler Inhale 2 puffs into the lungs every 4 (four) hours as needed for wheezing or shortness of breath.   alendronate (FOSAMAX) 70 MG tablet Take 1 tablet (70 mg total) by mouth every 7 (seven) days. Take with a full glass of water on an empty stomach.   ALPRAZolam (XANAX) 0.25 MG tablet Take 1 tablet (0.25 mg total) by mouth at bedtime as needed for anxiety.   atorvastatin (LIPITOR) 10 MG tablet Take 1 tablet (10 mg total) by mouth at bedtime.   donepezil (ARICEPT ODT) 10 MG disintegrating tablet Take 1 tablet (10 mg total) by mouth at bedtime.   famotidine (PEPCID) 20 MG tablet TAKE ONE TABLET BY MOUTH TWICE DAILY   fluticasone-salmeterol (ADVAIR HFA) 115-21 MCG/ACT inhaler Inhale 2 puffs into the lungs 2 (two) times daily.   folic acid (FOLVITE) 1 MG tablet Take 1 mg by mouth daily.   levothyroxine (SYNTHROID) 88 MCG tablet TAKE ONE TABLET BY MOUTH BEFORE BREAKFAST   methotrexate 50 MG/2ML injection SMARTSIG:0.6 Milliliter(s) IM Once a Week   No facility-administered encounter medications on file as of 07/02/2020.    Current Diagnosis: Patient Active Problem List   Diagnosis Date Noted   Psoriasis 11/18/2019   Essential hypertension 02/12/2018   Generalized anxiety disorder 01/01/2018   Mild cognitive impairment 11/10/2017   Elevated LFTs 10/04/2017   Loss of weight 10/04/2017   Loose stools 10/04/2017   Weight loss, non-intentional 08/07/2017   Preventative health care 08/07/2017   Cerumen impaction 08/25/2015   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   Memory loss 08/25/2015   Diarrhea 12/19/2014    KNEE PAIN, LEFT 05/27/2010   DYSURIA 05/27/2010   POSTMENOPAUSAL STATUS 04/23/2010   CERVICAL POLYP 10/26/2009   HEMATURIA, HX OF 10/26/2009   DIZZINESS 06/11/2009   FOOT, PAIN 11/17/2008   OTHER ABNORMAL BLOOD CHEMISTRY 11/17/2008   TINEA CRURIS 09/15/2008   UTI 09/15/2008   COLONIC POLYPS, ADENOMATOUS, HX OF 01/16/2008   HEMOCCULT POSITIVE STOOL 12/11/2007   BREAST CANCER, HX OF 12/11/2007   Hypothyroidism 12/07/2007   Hyperlipidemia LDL goal <100 12/07/2007   COPD with chronic bronchitis ([place]) 12/07/2007   Disorder of bone and articular cartilage 12/07/2007   URI 03/13/2007   HX, URINARY INFECTION 01/09/2007    Goals Addressed   None    Reviewed chart for medication changes ahead of medication coordination call.  No Consults visits since last care coordination call.  Office Visits: 05/22/20 [name], DO. Preventive Health care, Labs drawn. No medication changes.   Hospital: 05/28/20 Breck Coons, MD. Cough/Shortness of breath. CT images. STARTED Azithromycin 250 mg pack.and Prednisone 40 mg daily.  BP Readings from Last 3 Encounters:  05/28/20 (!) 141/58  05/22/20 108/70  03/23/20 140/80    Lab Results  Component Value Date   HGBA1C 6.1 07/20/2012     Patient obtains medications through Vials  90 Days   Last adherence delivery included:  Advair inhaler 115/21; inhale two puffs into the lungs twice a day  Patient declined meds last month: 90 DS on 04/10/20 Levothyroxine 88 mcg; one tab at breakfast  Folic Acid 1 mg; one tab at breakfast Alendronate 70 mg; one tab on Monday weekly Donepezil 10 mg; one tab at bedtime Methotrexate injection 25 mg/ml; 0.6 ml injected every week Atorvastatin 10mg : one tab with Breakfast  Famotidine 20 mg; one tab twice daily  Patient is due for next adherence delivery on: 07/10/19.  Called patient and reviewed medications and coordinated delivery.  This delivery to include: Atorvastatin 10mg : one tab with  Breakfast  Levothyroxine 88 mcg; one tab at breakfast Alendronate 70 mg; one tab on Monday weekly Methotrexate injection 25 mg/ml; 0.6 ml injected every week Advair inhaler 115/21; inhale two puffs into the lungs twice a day  Patient declined the following medications: (enough on hand) Folic Acid 1 mg; one tab at breakfast Donepezil 10 mg; one tab at bedtime Famotidine 20 mg; one tab twice daily (Not taking regularly)  Patient needs a refills for: Methotrexate injection 25 mg/ml; 0.6 ml injected every week.  Confirmed delivery date of 07/10/19, advised patient that pharmacy will contact them the morning of delivery.  Follow-Up:  Coordination of Enhanced Pharmacy Services and Pharmacist Review   Charlann Lange, [place] Pharmacist Assistant [phone]  10 minutes spent in review, coordination, and documentation.  Reviewed by: Beverly Milch, PharmD Clinical Pharmacist [facility] [phone]

## 2020-07-08 DIAGNOSIS — L408 Other psoriasis: Secondary | ICD-10-CM | POA: Diagnosis not present

## 2020-07-15 DIAGNOSIS — L408 Other psoriasis: Secondary | ICD-10-CM | POA: Diagnosis not present

## 2020-07-17 DIAGNOSIS — Z79899 Other long term (current) drug therapy: Secondary | ICD-10-CM | POA: Diagnosis not present

## 2020-07-17 NOTE — Progress Notes (Deleted)
Chronic Care Management Pharmacy Note  07/17/2020 Name:  Carrie Hall MRN:  [id number] DOB:  1933/05/31  Subjective: Carrie Hall is an 87 y.o. year old female who is a primary patient of [name], DO.  The CCM team was consulted for assistance with disease management and care coordination needs.    Engaged with patient by telephone for follow up visit in response to provider referral for pharmacy case management and/or care coordination services.   Consent to Services:  The patient was given the following information about Chronic Care Management services today, agreed to services, and gave verbal consent: 1. CCM service includes personalized support from designated clinical staff supervised by the primary care provider, including individualized plan of care and coordination with other care providers 2. 24/7 contact phone numbers for assistance for urgent and routine care needs. 3. Service will only be billed when office clinical staff spend 20 minutes or more in a month to coordinate care. 4. Only one practitioner may furnish and bill the service in a calendar month. 5.The patient may stop CCM services at any time (effective at the end of the month) by phone call to the office staff. 6. The patient will be responsible for cost sharing (co-pay) of up to 20% of the service fee (after annual deductible is met). Patient agreed to services and consent obtained.  Patient Care Team: Carollee Herter, Alferd Apa, DO as PCP - General Delice Lesch Lezlie Octave, MD as Consulting Physician (Neurology) Day, Melvenia Beam, [facility] (Inactive) as Pharmacist (Pharmacist) Susie Cassette, Katharine Look, MD as Referring Physician (Dermatology)  Recent office visits: 05/22/20 Etter Sjogren) - Regular follow up no medication changes noted.  Recent consult visits: None recent  Hospital visits: 05/28/20 (ED) - four cough, positive for COVID-19.  Started on Zpak and prednisone.  Objective:  Lab Results  Component Value Date    CREATININE 0.67 05/28/2020   BUN 12 05/28/2020   GFR 78.09 05/22/2020   GFRNONAA >60 05/28/2020   GFRAA  01/15/2010    >60        The eGFR has been calculated using the MDRD equation. This calculation has not been validated in all clinical situations. eGFR's persistently <60 mL/min signify possible Chronic Kidney Disease.   NA 140 05/28/2020   K 3.7 05/28/2020   CALCIUM 8.8 (L) 05/28/2020   CO2 29 05/28/2020    Lab Results  Component Value Date/Time   HGBA1C 6.1 07/20/2012 11:06 AM   HGBA1C 6.3 11/17/2008 11:34 AM   GFR 78.09 05/22/2020 09:26 AM   GFR 85.34 11/18/2019 10:03 AM    Last diabetic Eye exam: No results found for: HMDIABEYEEXA  Last diabetic Foot exam: No results found for: HMDIABFOOTEX   Lab Results  Component Value Date   CHOL 179 05/22/2020   HDL 58.40 05/22/2020   LDLCALC 105 (H) 05/22/2020   LDLDIRECT 103.3 03/07/2014   TRIG 81.0 05/22/2020   CHOLHDL 3 05/22/2020    Hepatic Function Latest Ref Rng & Units 05/22/2020 11/18/2019 05/07/2019  Total Protein 6.0 - 8.3 g/dL 6.3 6.3 6.1  Albumin 3.5 - 5.2 g/dL 4.2 4.3 3.9  AST 0 - 37 U/L _0 ALT 0 - 35 U/L _1 Alk Phosphatase 39 - 117 U/L 71 68 80  Total Bilirubin 0.2 - 1.2 mg/dL 0.7 0.7 0.6  Bilirubin, Direct 0.0 - 0.3 mg/dL - - -    Lab Results  Component Value Date/Time   TSH 1.34 05/22/2020 09:26 AM  TSH 1.15 11/18/2019 10:03 AM   FREET4 1.1 12/11/2007 12:00 AM    CBC Latest Ref Rng & Units 05/28/2020 05/22/2020 11/18/2019  WBC 4.0 - 10.5 K/uL 6.1 4.8 5.7  Hemoglobin 12.0 - 15.0 g/dL 13.1 13.4 13.5  Hematocrit 36.0 - 46.0 % 38.8 40.0 39.5  Platelets 150 - 400 K/uL 159 200.0 249.0    Lab Results  Component Value Date/Time   VD25OH 50 04/22/2010 08:21 PM   VD25OH 34 06/11/2009 12:00 AM    Clinical ASCVD: {YES/NO:21197} The ASCVD Risk score Mikey Bussing DC Jr., et al., 2013) failed to calculate for the following reasons:   The 2013 ASCVD risk score is only valid for ages 7 to 20     Depression screen PHQ 2/9 11/13/2019 05/07/2019 10/29/2018  Decreased Interest 0 0 0  Down, Depressed, Hopeless 0 0 0  PHQ - 2 Score 0 0 0     ***Other: (CHADS2VASc if Afib, MMRC or CAT for COPD, ACT, DEXA)  Social History   Tobacco Use  Smoking Status Former Smoker  . Packs/day: 0.50  . Types: Cigarettes  . Quit date: 12/06/2003  . Years since quitting: 16.6  Smokeless Tobacco Never Used   BP Readings from Last 3 Encounters:  05/28/20 (!) 141/58  05/22/20 108/70  03/23/20 140/80   Pulse Readings from Last 3 Encounters:  05/28/20 62  05/22/20 100  03/23/20 69   Wt Readings from Last 3 Encounters:  05/28/20 147 lb 8 oz (66.9 kg)  05/22/20 145 lb (65.8 kg)  03/23/20 147 lb 6.4 oz (66.9 kg)    Assessment/Interventions: Review of patient past medical history, allergies, medications, health status, including review of consultants reports, laboratory and other test data, was performed as part of comprehensive evaluation and provision of chronic care management services.   SDOH:  (Social Determinants of Health) assessments and interventions performed: {yes/no:20286}   CCM Care Plan  Allergies  Allergen Reactions  . Pneumococcal Vaccines     Medications Reviewed Today    Reviewed by Milagros Reap, RN (Registered Nurse) on 05/28/20 at [phone]  Med List Status: <None>  Medication Order Taking? Sig Documenting Provider Last Dose Status Informant  albuterol (VENTOLIN HFA) 108 (90 Base) MCG/ACT inhaler [id number]  Inhale 2 puffs into the lungs every 4 (four) hours as needed for wheezing or shortness of breath. Carollee Herter, Alferd Apa, DO  Active   alendronate (FOSAMAX) 70 MG tablet [id number]  Take 1 tablet (70 mg total) by mouth every 7 (seven) days. Take with a full glass of water on an empty stomach. Carollee Herter, Alferd Apa, DO  Active   ALPRAZolam Duanne Moron) 0.25 MG tablet [id number]  Take 1 tablet (0.25 mg total) by mouth at bedtime as needed for anxiety. [name], DO   Active   atorvastatin (LIPITOR) 10 MG tablet [id number]  Take 1 tablet (10 mg total) by mouth daily. [name], DO  Active   donepezil (ARICEPT ODT) 10 MG disintegrating tablet [id number]  Take 1 tablet (10 mg total) by mouth at bedtime. [name], DO  Active   famotidine (PEPCID) 20 MG tablet [id number]  Take 1 tablet (20 mg total) by mouth 2 (two) times daily. Carollee Herter, Alferd Apa, DO  Active   fluticasone-salmeterol (ADVAIR [facility]) [phone] MCG/ACT inhaler [id number]  Inhale 2 puffs into the lungs 2 (two) times daily. Carollee Herter, Kendrick Fries R, DO  Active   folic acid (FOLVITE) 1 MG tablet [id number]  Take 1 mg by  mouth daily. [provider]  Active   levothyroxine (SYNTHROID) 88 MCG tablet [id number]  TAKE ONE TABLET BY MOUTH BEFORE BREAKFAST [name], DO  Active   methotrexate 50 MG/2ML injection [id number]  SMARTSIG:0.6 Milliliter(s) IM Once a Week [provider]  Active           Patient Active Problem List   Diagnosis Date Noted  . Psoriasis 11/18/2019  . Essential hypertension 02/12/2018  . Generalized anxiety disorder 01/01/2018  . Mild cognitive impairment 11/10/2017  . Elevated LFTs 10/04/2017  . Loss of weight 10/04/2017  . Loose stools 10/04/2017  . Weight loss, non-intentional 08/07/2017  . Preventative health care 08/07/2017  . Cerumen impaction 08/25/2015  . 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  . Memory loss 08/25/2015  . Diarrhea 12/19/2014  . KNEE PAIN, LEFT 05/27/2010  . DYSURIA 05/27/2010  . POSTMENOPAUSAL STATUS 04/23/2010  . CERVICAL POLYP 10/26/2009  . HEMATURIA, HX OF 10/26/2009  . DIZZINESS 06/11/2009  . FOOT, PAIN 11/17/2008  . OTHER ABNORMAL BLOOD CHEMISTRY 11/17/2008  . TINEA CRURIS 09/15/2008  . UTI 09/15/2008  . COLONIC POLYPS, ADENOMATOUS, HX OF 01/16/2008  . HEMOCCULT POSITIVE STOOL 12/11/2007  . BREAST CANCER, HX OF 12/11/2007  . Hypothyroidism 12/07/2007  . Hyperlipidemia LDL goal <100 12/07/2007   . COPD with chronic bronchitis ([place]) 12/07/2007  . Disorder of bone and articular cartilage 12/07/2007  . URI 03/13/2007  . HX, URINARY INFECTION 01/09/2007    Immunization History  Administered Date(s) Administered  . Fluad Quad(high Dose 65+) 03/11/2019, 03/23/2020  . Influenza Split 03/15/2011  . Influenza Whole 03/21/2007, 02/04/2008, 02/25/2009, 03/11/2010, 02/07/2012  . Influenza, High Dose Seasonal PF 03/07/2014, 01/28/2016, 02/22/2017, 01/13/2018  . Influenza,inj,Quad PF,6+ Mos 02/14/2013, 02/26/2015  . PFIZER(Purple Top)SARS-COV-2 Vaccination 08/01/2019, 08/26/2019  . Pneumococcal Conjugate-13 03/07/2014  . Pneumococcal Polysaccharide-23 05/15/2006, 12/11/2007, 02/12/2018  . Td 02/24/2004  . Tdap 08/07/2017  . Zoster 11/02/2009    Conditions to be addressed/monitored:   Chronic Disease Management support, education, and care coordination needs related to COPD, HTN, HLD, Hypothyroidism, Anxiety, Osteoporosis, Memory Loss, Psorasis  There are no care plans that you recently modified to display for this patient.    Medication Assistance: {MEDASSISTANCEINFO:25044}  Patient's preferred pharmacy is:  [facility] - [place], [place] - [address] [place] [address] Phone: [phone] Fax: [phone]  [place], [place] [address] [place] [address] Phone: [phone] Fax: [phone]  Uses pill box? Yes Pt endorses 100% compliance  We discussed: {Pharmacy options:24294} Patient decided to: {US Pharmacy HYIF:02774}  Care Plan and Follow Up Patient Decision:  {FOLLOWUP:24991}  Plan: {CM FOLLOW UP JOIN:86767}  Beverly Milch, PharmD Clinical Pharmacist [facility] [phone]   Current Barriers:  . {pharmacybarriers:24917} . ***  Pharmacist Clinical Goal(s):  Marland Kitchen Over the next *** days, patient will  {PHARMACYGOALCHOICES:24921} through collaboration with PharmD and provider.  . ***  Interventions: . 1:1 collaboration with Carollee Herter, Alferd Apa, DO regarding development and update of comprehensive plan of care as evidenced by provider attestation and co-signature . Inter-disciplinary care team collaboration (see longitudinal plan of care) . Comprehensive medication review performed; medication list updated in electronic medical record  Hypertension (BP goal {CHL HP [facility] Pharmacist BP [phone]}) -{US controlled/uncontrolled:25276} -Current treatment: . None -Medications previously tried: ***  -Current home readings: *** -Current dietary habits: *** -Current exercise habits: *** -{ACTIONS;DENIES/REPORTS:21021675::"Denies"} hypotensive/hypertensive  symptoms -Educated on {CCM BP Counseling:25124} -Counseled to monitor BP at home ***, document, and provide log at future appointments -{CCMPHARMDINTERVENTION:25122}  Hyperlipidemia: (LDL goal < ***) -{US controlled/uncontrolled:25276} -Current treatment:  Atorvastatin 58m daily -Medications previously tried: ***  -Current dietary patterns: *** -Current exercise habits: *** -Educated on {CCM HLD Counseling:25126} -{CCMPHARMDINTERVENTION:25122}  COPD (Goal: control symptoms and prevent exacerbations) -{US controlled/uncontrolled:25276} -Current treatment   Advair 115-21 2 puffs twice daily  Ventolin as needed  -Medications previously tried: ***  -Gold Grade: {CHL HP [facility] COPD Gold GIXVEZ:[id number]}-Current COPD Classification:  {CHL HP [facility] COPD Classification:[phone]} -MMRC/CAT score: *** -Pulmonary function testing: *** -Exacerbations requiring treatment in last 6 months: *** -Patient {Actions; denies-reports:120008} consistent use of maintenance inhaler -Frequency of rescue inhaler use: *** -Counseled on {CCMINHALERCOUNSELING:25121} -{CCMPHARMDINTERVENTION:25122}  Hypothyroidism  (Goal: Maintain TSH) -{US controlled/uncontrolled:25276} -Current treatment   Levothyroxine 870m daily -Medications previously tried: ***  -{CCMPHARMDINTERVENTION:25122}  Osteoporosis / Osteopenia (Goal ***) -{US controlled/uncontrolled:25276} -Last DEXA Scan: ***   T-Score femoral neck: ***  T-Score total hip: ***  T-Score lumbar spine: ***  T-Score forearm radius: ***  10-year probability of major osteoporotic fracture: ***  10-year probability of hip fracture: *** -Patient {is;is not an osteoporosis candidate:23886} -Current treatment   Alendronate 7051meekly -Medications previously tried: ***   -{CCMPHARMDINTERVENTION:25122}   Patient Goals/Self-Care Activities . Over the next *** days, patient will:  - {pharmacypatientgoals:24919}  Follow Up Plan: {CM FOLLOW UP PLAKVTX:52174}

## 2020-07-22 ENCOUNTER — Telehealth: Payer: Medicare HMO

## 2020-07-22 DIAGNOSIS — L408 Other psoriasis: Secondary | ICD-10-CM | POA: Diagnosis not present

## 2020-07-28 ENCOUNTER — Ambulatory Visit (INDEPENDENT_AMBULATORY_CARE_PROVIDER_SITE_OTHER): Payer: Medicare HMO | Admitting: Pharmacist

## 2020-07-28 DIAGNOSIS — E785 Hyperlipidemia, unspecified: Secondary | ICD-10-CM

## 2020-07-28 DIAGNOSIS — G3184 Mild cognitive impairment, so stated: Secondary | ICD-10-CM

## 2020-07-28 DIAGNOSIS — J449 Chronic obstructive pulmonary disease, unspecified: Secondary | ICD-10-CM | POA: Diagnosis not present

## 2020-07-28 DIAGNOSIS — M8580 Other specified disorders of bone density and structure, unspecified site: Secondary | ICD-10-CM

## 2020-07-28 NOTE — Care Management Note (Deleted)
Chronic Care Management Pharmacy Note  07/28/2020 Name:  Carrie Hall MRN:  [id number] DOB:  March 24, 1934  Subjective: ALFIE ALDERFER is an 87 y.o. year old female who is a primary patient of [name], DO.  The CCM team was consulted for assistance with disease management and care coordination needs.    Engaged with patient by telephone for follow up visit in response to provider referral for pharmacy case management and/or care coordination services.   Patient lives alone, Widowed for 8 years. She has 2 sons that live adjacent to her. At least one son visits every day.  Consent to Services:  The patient was given information about Chronic Care Management services, agreed to services, and gave verbal consent prior to initiation of services.  Please see initial visit note for detailed documentation.   Patient Care Team: Carollee Herter, Alferd Apa, DO as PCP - General Delice Lesch Lezlie Octave, MD as Consulting Physician (Neurology) Hollar, Katharine Look, MD as Referring Physician (Dermatology) Cherre Robins, PharmD (Pharmacist)  Office Visits: 05/22/20 [name], DO. Preventive Health care, Labs drawn. No medication changes.   05/27/2020 - Dermatology (Dr Susie Cassette) Seen for f/u psoriasis. Receives weekly methotrexate injections at [facility] office.   ED Visit: 05/28/20 Breck Coons, MD. Cough/Shortness of breath. Patient with history of COPD. Chest x-ray was unremarkable for acute cardiopulmonary pathology.  D-dimer markedly elevated so she had CT PE study which showed no acute cardiopulmonary pathology. Given symptomatic control with steroids and breathing treatments with marked improvement in symptoms. Discharge home with Rx for Azithromycin 250 mg 5 day courseand Prednisone 40 mg daily. Discharge with pending COVID19 test - later reported positive.  Objective:  Lab Results  Component Value Date   CREATININE 0.67 05/28/2020   CREATININE 0.71 05/22/2020   CREATININE  0.66 11/18/2019    Lab Results  Component Value Date   HGBA1C 6.1 07/20/2012       Component Value Date/Time   CHOL 179 05/22/2020 0926   TRIG 81.0 05/22/2020 0926   TRIG 89 02/27/2006 0859   HDL 58.40 05/22/2020 0926   CHOLHDL 3 05/22/2020 0926   VLDL 16.2 05/22/2020 0926   LDLCALC 105 (H) 05/22/2020 0926   LDLDIRECT 103.3 03/07/2014 1123    Hepatic Function Latest Ref Rng & Units 05/22/2020 11/18/2019 05/07/2019  Total Protein 6.0 - 8.3 g/dL 6.3 6.3 6.1  Albumin 3.5 - 5.2 g/dL 4.2 4.3 3.9  AST 0 - 37 U/L '22 14 13  ' ALT 0 - 35 U/L '17 11 8  ' Alk Phosphatase 39 - 117 U/L 71 68 80  Total Bilirubin 0.2 - 1.2 mg/dL 0.7 0.7 0.6  Bilirubin, Direct 0.0 - 0.3 mg/dL - - -    Lab Results  Component Value Date/Time   TSH 1.34 05/22/2020 09:26 AM   TSH 1.15 11/18/2019 10:03 AM   FREET4 1.1 12/11/2007 12:00 AM    CBC Latest Ref Rng & Units 05/28/2020 05/22/2020 11/18/2019  WBC 4.0 - 10.5 K/uL 6.1 4.8 5.7  Hemoglobin 12.0 - 15.0 g/dL 13.1 13.4 13.5  Hematocrit 36.0 - 46.0 % 38.8 40.0 39.5  Platelets 150 - 400 K/uL 159 200.0 249.0    Lab Results  Component Value Date/Time   VD25OH 50 04/22/2010 08:21 PM   VD25OH 34 06/11/2009 12:00 AM    Clinical ASCVD: No  The ASCVD Risk score Mikey Bussing DC Jr., et al., 2013) failed to calculate for the following reasons:   The 2013 ASCVD risk score  is only valid for ages 97 to 27    Other: (CHADS2VASc if Afib, PHQ9 if depression, MMRC or CAT for COPD, ACT, DEXA)  Social History   Tobacco Use  Smoking Status Former Smoker  . Packs/day: 0.50  . Types: Cigarettes  . Quit date: 12/06/2003  . Years since quitting: 16.6  Smokeless Tobacco Never Used   BP Readings from Last 3 Encounters:  05/28/20 (!) 141/58  05/22/20 108/70  03/23/20 140/80   Pulse Readings from Last 3 Encounters:  05/28/20 62  05/22/20 100  03/23/20 69   Wt Readings from Last 3 Encounters:  05/28/20 147 lb 8 oz (66.9 kg)  05/22/20 145 lb (65.8 kg)  03/23/20 147 lb 6.4  oz (66.9 kg)    Assessment: Review of patient past medical history, allergies, medications, health status, including review of consultants reports, laboratory and other test data, was performed as part of comprehensive evaluation and provision of chronic care management services.   SDOH:  (Social Determinants of Health) assessments and interventions performed:    CCM Care Plan  Allergies  Allergen Reactions  . Pneumococcal Vaccines     Medications Reviewed Today    Reviewed by Cherre Robins, PharmD (Pharmacist) on 07/28/20 at 1134  Med List Status: <None>  Medication Order Taking? Sig Documenting Provider Last Dose Status Informant  albuterol (VENTOLIN HFA) 108 (90 Base) MCG/ACT inhaler [id number] Yes Inhale 2 puffs into the lungs every 4 (four) hours as needed for wheezing or shortness of breath. [name], DO Taking Active   alendronate (FOSAMAX) 70 MG tablet [id number] Yes Take 1 tablet (70 mg total) by mouth every 7 (seven) days. Take with a full glass of water on an empty stomach. [name], DO Taking Active   ALPRAZolam Duanne Moron) 0.25 MG tablet [id number] Yes Take 1 tablet (0.25 mg total) by mouth at bedtime as needed for anxiety. [name], DO Taking Active   atorvastatin (LIPITOR) 10 MG tablet [id number] Yes Take 1 tablet (10 mg total) by mouth at bedtime. [name], DO Taking Active   donepezil (ARICEPT ODT) 10 MG disintegrating tablet [id number] Yes Take 1 tablet (10 mg total) by mouth at bedtime. [name], DO Taking Active   fluticasone-salmeterol (ADVAIR [facility]) 762-83 MCG/ACT inhaler [id number] Yes Inhale 2 puffs into the lungs 2 (two) times daily. [name], DO Taking Active   folic acid (FOLVITE) 1 MG tablet [id number] Yes Take 1 mg by mouth daily. [provider] Taking Active   levothyroxine (SYNTHROID) 88 MCG tablet [id number] Yes TAKE ONE TABLET BY MOUTH BEFORE BREAKFAST [name], DO Taking  Active   methotrexate 50 MG/2ML injection [id number] Yes SMARTSIG:0.6 Milliliter(s) IM Once a Week [provider] Taking Active   Multiple Vitamins-Minerals (PRESERVISION AREDS 2 PO) [id number] Yes Take 1 tablet by mouth 2 (two) times daily. [provider] Taking Active           Patient Active Problem List   Diagnosis Date Noted  . Psoriasis 11/18/2019  . Essential hypertension 02/12/2018  . Generalized anxiety disorder 01/01/2018  . Mild cognitive impairment 11/10/2017  . Elevated LFTs 10/04/2017  . Loss of weight 10/04/2017  . Loose stools 10/04/2017  . Weight loss, non-intentional 08/07/2017  . Preventative health care 08/07/2017  . Cerumen impaction 08/25/2015  . 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  . Memory loss 08/25/2015  . Diarrhea 12/19/2014  . KNEE PAIN, LEFT 05/27/2010  .  DYSURIA 05/27/2010  . POSTMENOPAUSAL STATUS 04/23/2010  . CERVICAL POLYP 10/26/2009  . HEMATURIA, HX OF 10/26/2009  . DIZZINESS 06/11/2009  . FOOT, PAIN 11/17/2008  . OTHER ABNORMAL BLOOD CHEMISTRY 11/17/2008  . TINEA CRURIS 09/15/2008  . UTI 09/15/2008  . COLONIC POLYPS, ADENOMATOUS, HX OF 01/16/2008  . HEMOCCULT POSITIVE STOOL 12/11/2007  . BREAST CANCER, HX OF 12/11/2007  . Hypothyroidism 12/07/2007  . Hyperlipidemia LDL goal <100 12/07/2007  . COPD with chronic bronchitis ([place]) 12/07/2007  . Disorder of bone and articular cartilage 12/07/2007  . URI 03/13/2007  . HX, URINARY INFECTION 01/09/2007    Immunization History  Administered Date(s) Administered  . Fluad Quad(high Dose 65+) 03/11/2019, 03/23/2020  . Influenza Split 03/15/2011  . Influenza Whole 03/21/2007, 02/04/2008, 02/25/2009, 03/11/2010, 02/07/2012  . Influenza, High Dose Seasonal PF 03/07/2014, 01/28/2016, 02/22/2017, 01/13/2018  . Influenza,inj,Quad PF,6+ Mos 02/14/2013, 02/26/2015  . PFIZER(Purple Top)SARS-COV-2 Vaccination 08/01/2019, 08/26/2019  . Pneumococcal Conjugate-13 03/07/2014  .  Pneumococcal Polysaccharide-23 05/15/2006, 12/11/2007, 02/12/2018  . Td 02/24/2004  . Tdap 08/07/2017  . Zoster 11/02/2009    Conditions to be addressed/monitored: HTN, HLD, COPD, Anxiety and osteoporosis; mild cognitive impairment, psoriasis, hypothyroidism  There are no care plans that you recently modified to display for this patient.   Medication Assistance: None required.  Patient affirms current coverage meets needs.  Patient's preferred pharmacy is:  [facility] - [place], [place] - [address] [place] [address] Phone: [phone] Fax: [phone]  [place], [place] [address] [place] [address] Phone: [phone] Fax: [phone]  Uses pill box? Yes Pt endorses 95% compliance - states she misses evening dose of donepezil 1 or 2 times per month because she fall asleep before she takes medication. Discussed tips to improve adherence.   GERD - patient has famotidine 94m bid on her med list but she states she has not taken in several months. She denies recent acid reflux and states she only too famotidine 2 or 3 times due to burping. Updated med list.   Osteoporosis - last DEXA was 09/2017; Dr LEtter Sjogrenmentioned in last note she would order DEXA with next visit. Reviewed with patient proper administration of alendronate. Reviewed fall prevention.   COPD - patient is using Advair regularly - 2 puffs twice a day. Reports she using rescue inhaler only 1 or 2 times per week.   HDL - patient endorses taking atorvastatin 153mdaily. Last LDL had increased compared to July 2021. Consider rechecking lipids at next OV and if LDL stil >100, could increase atorvastatin to 2061maily. Discussed limiting saturated and trans fat.   Follow Up:  Patient agrees to Care Plan and Follow-up.  Plan: Telephone follow up appointment with care management team member  scheduled for:  6 months  TamCherre RobinsharmD Clinical Pharmacist [facility]

## 2020-07-28 NOTE — Patient Instructions (Signed)
Visit Information  PATIENT GOALS: Goals Addressed            This Visit's Progress   . Chronic Care Management Pharmacy Care Plan   On track    CARE PLAN ENTRY (see longitudinal plan of care for additional care plan information)  Current Barriers:  . Chronic Disease Management support, education, and care coordination needs related to COPD, HTN, HLD, Hypothyroidism, Anxiety, Osteoporosis, Memory Loss, Psorasis   Hypertension BP Readings from Last 3 Encounters:  05/28/20 (!) 141/58  05/22/20 108/70  03/23/20 140/80   . Pharmacist Clinical Goal(s): o Over the next 180 days, patient will work with PharmD and providers to maintain BP goal <140/90 . Current regimen:  o Diet and exercise management   . Patient self care activities - Over the next 180 days, patient will: o Maintain blood pressure <140/90  Hyperlipidemia Lab Results  Component Value Date/Time   LDLCALC 105 (H) 05/22/2020 09:26 AM   LDLDIRECT 103.3 03/07/2014 11:23 AM   . Pharmacist Clinical Goal(s): o Over the next 180 days, patient will work with PharmD and providers to maintain LDL goal < 100 . Current regimen:  o Atorvastatin 10mg  daily . Interventions: o LDL previously at goal with LDL of 83 in July 2021. However most recent LDL was slightly increased and above goal at 105 (Janyary 2022).  o Recommend limiting intake of saturated and trans fat. o Consider rechecking lipids at next office visit. If LDL still >100, then increase atorvastatin to 20mg  daily . Patient self care activities - Over the next 180 days, patient will: o Maintain cholesterol medication regimen.  o Limit intake of saturated and trans fat.   COPD . Pharmacist Clinical Goal(s) o Over the next 180 days, patient will work with PharmD and providers to reduce symptoms associated with COPD . Current regimen:   Advair 115-21 Inhale 2 puffs into lungs twice daily  Ventolin / albuterol as needed  . Interventions: o None . Patient self  care activities - Over the next 180 days, patient will: o Continue to use Advair twice a day  Osteoporosis . Pharmacist Clinical Goal(s) o Over the next 180 days, patient will work with PharmD and providers to reduce risk of fracture due to osteoporosis  . Current regimen:  o Alendronate 70mg  weekly . Interventions: o Consider repeat DEXA (last was 10/06/2017) . Patient self care activities - Over the next 180 days, patient will: o Maintain osteoporosis medication regimen o Complete DEXA Scan  Medication management . Pharmacist Clinical Goal(s): o Over the next 180 days, patient will work with PharmD and providers to achieve optimal medication adherence . Current pharmacy: [facility] . Interventions o Comprehensive medication review performed. o Utilize [facility] pharmacy for medication synchronization and delivery . Patient self care activities - Over the next 180 days, patient will: o Focus on medication adherence by filling and taking medications appropriately  o Take medications as prescribed o Report any questions or concerns to PharmD and/or provider(s)  Please see past updates related to this goal by clicking on the "Past Updates" button in the selected goal       . COMPLETED: Consider restarting alendronate 70mg  once weekly pending Dr. Etter Sjogren approval       This medication will help with your bones    . Remember to take your medications as prescribed   On track    Once your medications are placed into packaging, this will help you to remember your medications. I will or a member  of the pharmacy team will also start calling you monthly to see how things are going.       The patient verbalized understanding of instructions, educational materials, and care plan provided today and declined offer to receive copy of patient instructions, educational materials, and care plan.   Telephone follow up appointment with care management team member scheduled for: 6 months  Cherre Robins, PharmD Clinical Pharmacist [facility] [phone]

## 2020-07-28 NOTE — Progress Notes (Signed)
Chronic Care Management Pharmacy Note  07/28/2020 Name:  Carrie Hall          MRN:  [id number]      DOB:  1933-07-28  Subjective: Carrie Hall is an 87 y.o. year old female who is a primary patient of [name], DO.  The CCM team was consulted for assistance with disease management and care coordination needs.    Engaged with patient by telephone for follow up visit in response to provider referral for pharmacy case management and/or care coordination services.   Patient lives alone, Widowed for 8 years. She has 2 sons that live adjacent to her. At least one son visits every day.  Consent to Services:  The patient was given information about Chronic Care Management services, agreed to services, and gave verbal consent prior to initiation of services.  Please see initial visit note for detailed documentation.   Patient Care Team: Carollee Herter, Alferd Apa, DO as PCP - General Delice Lesch Lezlie Octave, MD as Consulting Physician (Neurology) Hollar, Katharine Look, MD as Referring Physician (Dermatology) Cherre Robins, PharmD (Pharmacist)  Office Visits: 05/22/20 [name], DO. Preventive Health care, Labs drawn. No medication changes.   05/27/2020 - Dermatology (Dr Susie Cassette) Seen for f/u psoriasis. Receives weekly methotrexate injections at [facility] office.   ED Visit: 05/28/20 Breck Coons, MD. Cough/Shortness of breath. Patient with history of COPD. Chest x-ray was unremarkable for acute cardiopulmonary pathology. D-dimer markedly elevated so she had CT PE study which showed no acute cardiopulmonary pathology. Given symptomatic control with steroids and breathing treatments with marked improvement in symptoms. Discharge home with Rx for Azithromycin 250 mg 5 day courseand Prednisone 40 mg daily. Discharge with pending COVID19 test - later reported positive.  Objective:  Recent Labs       Lab Results  Component Value Date   CREATININE 0.67 05/28/2020    CREATININE 0.71 05/22/2020   CREATININE 0.66 11/18/2019      Recent Labs       Lab Results  Component Value Date   HGBA1C 6.1 07/20/2012      Labs (Brief)     Component Value Date/Time   CHOL 179 05/22/2020 0926   TRIG 81.0 05/22/2020 0926   TRIG 89 02/27/2006 0859   HDL 58.40 05/22/2020 0926   CHOLHDL 3 05/22/2020 0926   VLDL 16.2 05/22/2020 0926   LDLCALC 105 (H) 05/22/2020 0926   LDLDIRECT 103.3 03/07/2014 1123      Hepatic Function Latest Ref Rng & Units 05/22/2020 11/18/2019 05/07/2019  Total Protein 6.0 - 8.3 g/dL 6.3 6.3 6.1  Albumin 3.5 - 5.2 g/dL 4.2 4.3 3.9  AST 0 - 37 U/L '22 14 13  ' ALT 0 - 35 U/L '17 11 8  ' Alk Phosphatase 39 - 117 U/L 71 68 80  Total Bilirubin 0.2 - 1.2 mg/dL 0.7 0.7 0.6  Bilirubin, Direct 0.0 - 0.3 mg/dL - - -    Labs (Brief)       Lab Results  Component Value Date/Time   TSH 1.34 05/22/2020 09:26 AM   TSH 1.15 11/18/2019 10:03 AM   FREET4 1.1 12/11/2007 12:00 AM      CBC Latest Ref Rng & Units 05/28/2020 05/22/2020 11/18/2019  WBC 4.0 - 10.5 K/uL 6.1 4.8 5.7  Hemoglobin 12.0 - 15.0 g/dL 13.1 13.4 13.5  Hematocrit 36.0 - 46.0 % 38.8 40.0 39.5  Platelets 150 - 400 K/uL 159 200.0 249.0    Labs (Brief)  Lab Results  Component Value Date/Time   VD25OH 50 04/22/2010 08:21 PM   VD25OH 34 06/11/2009 12:00 AM      Clinical ASCVD: No  The ASCVD Risk score Mikey Bussing DC Jr., et al., 2013) failed to calculate for the following reasons:   The 2013 ASCVD risk score is only valid for ages 83 to 26    Other: (CHADS2VASc if Afib, PHQ9 if depression, MMRC or CAT for COPD, ACT, DEXA)  Social History       Tobacco Use  Smoking Status Former Smoker  . Packs/day: 0.50  . Types: Cigarettes  . Quit date: 12/06/2003  . Years since quitting: 16.6  Smokeless Tobacco Never Used      BP Readings from Last 3 Encounters:  05/28/20 (!) 141/58  05/22/20 108/70  03/23/20 140/80      Pulse Readings from  Last 3 Encounters:  05/28/20 62  05/22/20 100  03/23/20 69      Wt Readings from Last 3 Encounters:  05/28/20 147 lb 8 oz (66.9 kg)  05/22/20 145 lb (65.8 kg)  03/23/20 147 lb 6.4 oz (66.9 kg)    Assessment: Review of patient past medical history, allergies, medications, health status, including review of consultants reports, laboratory and other test data, was performed as part of comprehensive evaluation and provision of chronic care management services.   SDOH:  (Social Determinants of Health) assessments and interventions performed:   CCM Care Plan      Allergies  Allergen Reactions  . Pneumococcal Vaccines        Medications Reviewed Today                Reviewed by Cherre Robins, PharmD (Pharmacist) on 07/28/20 at 1134  Med List Status: <None>             Medication Order Taking? Sig Documenting Provider Last Dose Status Informant   albuterol (VENTOLIN HFA) 108 (90 Base) MCG/ACT inhaler [id number] Yes Inhale 2 puffs into the lungs every 4 (four) hours as needed for wheezing or shortness of breath. [name], DO Taking Active    alendronate (FOSAMAX) 70 MG tablet [id number] Yes Take 1 tablet (70 mg total) by mouth every 7 (seven) days. Take with a full glass of water on an empty stomach. [name], DO Taking Active    ALPRAZolam Duanne Moron) 0.25 MG tablet [id number] Yes Take 1 tablet (0.25 mg total) by mouth at bedtime as needed for anxiety. [name], DO Taking Active    atorvastatin (LIPITOR) 10 MG tablet [id number] Yes Take 1 tablet (10 mg total) by mouth at bedtime. [name], DO Taking Active    donepezil (ARICEPT ODT) 10 MG disintegrating tablet [id number] Yes Take 1 tablet (10 mg total) by mouth at bedtime. [name], DO Taking Active    fluticasone-salmeterol (ADVAIR [facility]) 222-97 MCG/ACT inhaler [id number] Yes Inhale 2 puffs into the lungs 2 (two) times daily. [name], DO Taking Active     folic acid (FOLVITE) 1 MG tablet [id number] Yes Take 1 mg by mouth daily. [provider] Taking Active    levothyroxine (SYNTHROID) 88 MCG tablet [id number] Yes TAKE ONE TABLET BY MOUTH BEFORE BREAKFAST [name], DO Taking Active    methotrexate 50 MG/2ML injection [id number] Yes SMARTSIG:0.6 Milliliter(s) IM Once a Week [provider] Taking Active    Multiple Vitamins-Minerals (PRESERVISION AREDS 2 PO) [id number] Yes Take 1 tablet by mouth 2 (two)  times daily. [provider] Taking Active                Patient Active Problem List   Diagnosis Date Noted  . Psoriasis 11/18/2019  . Essential hypertension 02/12/2018  . Generalized anxiety disorder 01/01/2018  . Mild cognitive impairment 11/10/2017  . Elevated LFTs 10/04/2017  . Loss of weight 10/04/2017  . Loose stools 10/04/2017  . Weight loss, non-intentional 08/07/2017  . Preventative health care 08/07/2017  . Cerumen impaction 08/25/2015  . 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  . Memory loss 08/25/2015  . Diarrhea 12/19/2014  . KNEE PAIN, LEFT 05/27/2010  . DYSURIA 05/27/2010  . POSTMENOPAUSAL STATUS 04/23/2010  . CERVICAL POLYP 10/26/2009  . HEMATURIA, HX OF 10/26/2009  . DIZZINESS 06/11/2009  . FOOT, PAIN 11/17/2008  . OTHER ABNORMAL BLOOD CHEMISTRY 11/17/2008  . TINEA CRURIS 09/15/2008  . UTI 09/15/2008  . COLONIC POLYPS, ADENOMATOUS, HX OF 01/16/2008  . HEMOCCULT POSITIVE STOOL 12/11/2007  . BREAST CANCER, HX OF 12/11/2007  . Hypothyroidism 12/07/2007  . Hyperlipidemia LDL goal <100 12/07/2007  . COPD with chronic bronchitis ([place]) 12/07/2007  . Disorder of bone and articular cartilage 12/07/2007  . URI 03/13/2007  . HX, URINARY INFECTION 01/09/2007        Immunization History  Administered Date(s) Administered  . Fluad Quad(high Dose 65+) 03/11/2019, 03/23/2020  . Influenza Split 03/15/2011  . Influenza Whole 03/21/2007, 02/04/2008, 02/25/2009, 03/11/2010,  02/07/2012  . Influenza, High Dose Seasonal PF 03/07/2014, 01/28/2016, 02/22/2017, 01/13/2018  . Influenza,inj,Quad PF,6+ Mos 02/14/2013, 02/26/2015  . PFIZER(Purple Top)SARS-COV-2 Vaccination 08/01/2019, 08/26/2019  . Pneumococcal Conjugate-13 03/07/2014  . Pneumococcal Polysaccharide-23 05/15/2006, 12/11/2007, 02/12/2018  . Td 02/24/2004  . Tdap 08/07/2017  . Zoster 11/02/2009    Conditions to be addressed/monitored: HTN, HLD, COPD, Anxiety and osteoporosis; mild cognitive impairment, psoriasis, hypothyroidism  There are no care plans that you recently modified to display for this patient.   Medication Assistance: None required.  Patient affirms current coverage meets needs.  Patient's preferred pharmacy is:  [facility] - [place], [place] - [address] [place] [address] Phone: [phone] Fax: [phone]  [facility] [place] [address] [place] [address] Phone: [phone] Fax: [phone]  Uses pill box? Yes Pt endorses 95% compliance - states she misses evening dose of donepezil 1 or 2 times per month because she fall asleep before she takes medication. Discussed tips to improve adherence.   GERD - patient has famotidine 80m bid on her med list but she states she has not taken in several months. She denies recent acid reflux and states she only too famotidine 2 or 3 times due to burping. Updated med list.   Osteoporosis - last DEXA was 09/2017; Dr LEtter Sjogrenmentioned in last note she would order DEXA with next visit. Reviewed with patient proper administration of alendronate. Reviewed fall prevention.   COPD - patient is using Advair regularly - 2 puffs twice a day. Reports she using rescue inhaler only 1 or 2 times per week.   HDL - patient endorses taking atorvastatin 1108mdaily. Last LDL had increased compared to July 2021. Consider  rechecking lipids at next OV and if LDL stil >100, could increase atorvastatin to 2069maily. Discussed limiting saturated and trans fat.   Follow Up:  Patient agrees to Care Plan and Follow-up.  Plan: Telephone follow up appointment with care management team  member scheduled for:  6 months  Cherre Robins, PharmD Clinical Pharmacist [facility]

## 2020-07-29 DIAGNOSIS — L408 Other psoriasis: Secondary | ICD-10-CM | POA: Diagnosis not present

## 2020-07-31 ENCOUNTER — Telehealth: Payer: Self-pay | Admitting: Pharmacist

## 2020-07-31 NOTE — Progress Notes (Addendum)
° ° °  Chronic Care Management Pharmacy Assistant   Name: Carrie Hall  MRN: [id number] DOB: 12-10-1933  Reason for Encounter: Medication Review-Medication Coordination Call  Medications: Outpatient Encounter Medications as of 07/31/2020  Medication Sig   albuterol (VENTOLIN HFA) 108 (90 Base) MCG/ACT inhaler Inhale 2 puffs into the lungs every 4 (four) hours as needed for wheezing or shortness of breath.   alendronate (FOSAMAX) 70 MG tablet Take 1 tablet (70 mg total) by mouth every 7 (seven) days. Take with a full glass of water on an empty stomach.   ALPRAZolam (XANAX) 0.25 MG tablet Take 1 tablet (0.25 mg total) by mouth at bedtime as needed for anxiety.   atorvastatin (LIPITOR) 10 MG tablet Take 1 tablet (10 mg total) by mouth at bedtime.   donepezil (ARICEPT ODT) 10 MG disintegrating tablet Take 1 tablet (10 mg total) by mouth at bedtime.   fluticasone-salmeterol (ADVAIR HFA) 115-21 MCG/ACT inhaler Inhale 2 puffs into the lungs 2 (two) times daily.   folic acid (FOLVITE) 1 MG tablet Take 1 mg by mouth daily.   levothyroxine (SYNTHROID) 88 MCG tablet TAKE ONE TABLET BY MOUTH BEFORE BREAKFAST   methotrexate 50 MG/2ML injection SMARTSIG:0.6 Milliliter(s) IM Once a Week   Multiple Vitamins-Minerals (PRESERVISION AREDS 2 PO) Take 1 tablet by mouth 2 (two) times daily.   No facility-administered encounter medications on file as of 07/31/2020.   Reviewed chart for medication changes ahead of medication coordination call.  No OVs, Consults, or hospital visits since last Pharmacist visit with [name].  No medication changes indicated.  BP Readings from Last 3 Encounters:  05/28/20 (!) 141/58  05/22/20 108/70  03/23/20 140/80    Lab Results  Component Value Date   HGBA1C 6.1 07/20/2012     Patient obtains medications through V[facility]  90 Days   Last adherence delivery included: (90 DS 07/10/19) Atorvastatin 10mg : one tab with Breakfast  Levothyroxine 88 mcg; one tab at  breakfast Alendronate 70 mg; one tab on Monday weekly Methotrexate injection 25 mg/ml; 0.6 ml injected every week Advair inhaler 115/21; inhale two puffs into the lungs twice a day  Patient declined meds last month:(enough on hand) Folic Acid 1 mg; one tab at breakfast Donepezil 10 mg; one tab at bedtime Famotidine 20 mg; one tab twice daily (Not taking regularly)  Patient is due for next adherence delivery on: 08/14/20.  Called patient and reviewed medications and coordinated delivery.  This delivery to include: Folic Acid 1 mg; one tab at breakfast Donepezil 10 mg; one tab at bedtime  Patient declined the following medications: Famotidine 20 mg; one tab twice daily (Not taking Per Patient) Atorvastatin 10mg : one tab with Breakfast (enough on hand 90 DS 07/10/19) Levothyroxine 88 mcg; one tab at breakfast (enough on hand 90 DS 07/10/19) Alendronate 70 mg; one tab on Monday weekly (enough on hand 90 DS 07/10/19) Methotrexate injection 25 mg/ml; 0.6 ml injected every week (enough on hand 90 DS 07/10/19) Advair inhaler 115/21; inhale two puffs into the lungs twice a day (enough on hand 90 DS 07/10/19)  Patient does not need refills at this time.   Confirmed delivery date of 08/14/20, advised patient that pharmacy will contact them the morning of delivery.  Follow-Up:Pharmacist Review  Charlann Lange, RMA Clinical Pharmacist Assistant [phone]  10 minutes spent in review, coordination, and documentation.  Reviewed by: Beverly Milch, PharmD Clinical Pharmacist [facility] [phone]

## 2020-08-05 DIAGNOSIS — L408 Other psoriasis: Secondary | ICD-10-CM | POA: Diagnosis not present

## 2020-08-11 ENCOUNTER — Encounter: Payer: Self-pay | Admitting: Family Medicine

## 2020-08-11 ENCOUNTER — Other Ambulatory Visit: Payer: Self-pay

## 2020-08-11 ENCOUNTER — Ambulatory Visit (INDEPENDENT_AMBULATORY_CARE_PROVIDER_SITE_OTHER): Payer: Medicare HMO | Admitting: Family Medicine

## 2020-08-11 ENCOUNTER — Other Ambulatory Visit (HOSPITAL_COMMUNITY)
Admission: RE | Admit: 2020-08-11 | Discharge: 2020-08-11 | Disposition: A | Discharge status: Home / Self Care | Payer: Medicare HMO | Source: Ambulatory Visit | Attending: Family Medicine | Admitting: Family Medicine

## 2020-08-11 ENCOUNTER — Ambulatory Visit (HOSPITAL_BASED_OUTPATIENT_CLINIC_OR_DEPARTMENT_OTHER)
Admission: RE | Admit: 2020-08-11 | Discharge: 2020-08-11 | Disposition: A | Discharge status: Home / Self Care | Payer: Medicare HMO | Source: Ambulatory Visit | Attending: Family Medicine | Admitting: Family Medicine

## 2020-08-11 VITALS — BP 130/80 | HR 81 | Temp 98.1°F | Resp 18 | Ht 64.0 in | Wt 143.6 lb

## 2020-08-11 DIAGNOSIS — M47816 Spondylosis without myelopathy or radiculopathy, lumbar region: Secondary | ICD-10-CM | POA: Insufficient documentation

## 2020-08-11 DIAGNOSIS — N898 Other specified noninflammatory disorders of vagina: Secondary | ICD-10-CM | POA: Insufficient documentation

## 2020-08-11 DIAGNOSIS — R34 Anuria and oliguria: Secondary | ICD-10-CM | POA: Diagnosis not present

## 2020-08-11 DIAGNOSIS — M545 Low back pain, unspecified: Secondary | ICD-10-CM | POA: Diagnosis not present

## 2020-08-11 DIAGNOSIS — R350 Frequency of micturition: Secondary | ICD-10-CM

## 2020-08-11 DIAGNOSIS — B373 Candidiasis of vulva and vagina: Secondary | ICD-10-CM | POA: Insufficient documentation

## 2020-08-11 LAB — POC URINALSYSI DIPSTICK (AUTOMATED)
Bilirubin, UA: NEGATIVE
Blood, UA: NEGATIVE
Glucose, UA: NEGATIVE
Ketones, UA: NEGATIVE
Leukocytes, UA: NEGATIVE
Nitrite, UA: NEGATIVE
Protein, UA: NEGATIVE
Spec Grav, UA: 1.02 (ref 1.010–1.025)
Urobilinogen, UA: 0.2 E.U./dL
pH, UA: 6 (ref 5.0–8.0)

## 2020-08-11 MED ORDER — TIZANIDINE HCL 4 MG PO TABS: 4.0000 mg | ORAL_TABLET | Freq: Four times a day (QID) | ORAL | 0 refills | Status: DC | PRN

## 2020-08-11 NOTE — Progress Notes (Signed)
Subjective:    Patient ID: Carrie Hall, female    DOB: [birth year], 87 y.o.   MRN: [id number]  Chief Complaint  Patient presents with  . Urinary Frequency    Pt states sxs started last week. Pt states having frequency and low back pain.     HPI Patient is in today for R back pain and trouble urinating- since Thursday.  No dysuria   No abd pain  No fever  Pt had been cleaning house Thursday and pain started after   Past Medical History:  Diagnosis Date  . Adenomatous colon polyp   . Breast cancer ([place])   . COPD (chronic obstructive pulmonary disease) ([place])   . Diverticulosis   . Hyperlipidemia   . Internal hemorrhoids   . Osteopenia   . Thyroid disease     Past Surgical History:  Procedure Laterality Date  . BREAST LUMPECTOMY Left    radiation only  . BREAST SURGERY    . colon polyps      Family History  Problem Relation Age of Onset  . Alzheimer's disease Mother   . Cancer Father        lung  . Alzheimer's disease Maternal Grandmother   . Rectal cancer Sister        in her 57s    Social History   Socioeconomic History  . Marital status: Widowed    Spouse name: Not on file  . Number of children: Not on file  . Years of education: Not on file  . Highest education level: Not on file  Occupational History  . Not on file  Tobacco Use  . Smoking status: Former Smoker    Packs/day: 0.50    Types: Cigarettes    Quit date: 12/06/2003    Years since quitting: 16.6  . Smokeless tobacco: Never Used  Vaping Use  . Vaping Use: Never used  Substance and Sexual Activity  . Alcohol use: No  . Drug use: No  . Sexual activity: Never    Partners: Male  Other Topics Concern  . 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 Difficulty of Paying  Living Expenses: Not hard at all  Food Insecurity: No Food Insecurity  . Worried About Charity fundraiser in the Last Year: Never true  . Ran Out of Food in the Last Year: Never true  Transportation Needs: No Transportation Needs  . Lack of Transportation (Medical): No  . Lack of Transportation (Non-Medical): No  Physical Activity: Not on file  Stress: Stress Concern Present  . Feeling of Stress : To some extent  Social Connections: Not on file  Intimate Partner Violence: Not on file    Outpatient Medications Prior to Visit  Medication Sig Dispense Refill  . albuterol (VENTOLIN HFA) 108 (90 Base) MCG/ACT inhaler Inhale 2 puffs into the lungs every 4 (four) hours as needed for wheezing or shortness of breath. 54 g 1  . alendronate (FOSAMAX) 70 MG tablet Take 1 tablet (70 mg total) by mouth every 7 (seven) days. Take with a full glass of water on an empty stomach. 12 tablet 3  . ALPRAZolam (XANAX) 0.25 MG tablet Take 1 tablet (0.25 mg  total) by mouth at bedtime as needed for anxiety. 30 tablet 0  . atorvastatin (LIPITOR) 10 MG tablet Take 1 tablet (10 mg total) by mouth at bedtime. 90 tablet 1  . donepezil (ARICEPT ODT) 10 MG disintegrating tablet Take 1 tablet (10 mg total) by mouth at bedtime. 90 tablet 3  . fluticasone-salmeterol (ADVAIR HFA) 115-21 MCG/ACT inhaler Inhale 2 puffs into the lungs 2 (two) times daily. 1 Inhaler 12  . folic acid (FOLVITE) 1 MG tablet Take 1 mg by mouth daily.    Marland Kitchen levothyroxine (SYNTHROID) 88 MCG tablet TAKE ONE TABLET BY MOUTH BEFORE BREAKFAST 90 tablet 1  . methotrexate 50 MG/2ML injection SMARTSIG:0.6 Milliliter(s) IM Once a Week    . Multiple Vitamins-Minerals (PRESERVISION AREDS 2 PO) Take 1 tablet by mouth 2 (two) times daily.     No facility-administered medications prior to visit.    Allergies  Allergen Reactions  . Pneumococcal Vaccines     Review of Systems  Constitutional: Negative for chills, fever and malaise/fatigue.  HENT: Negative  for congestion and hearing loss.   Eyes: Negative for discharge.  Respiratory: Negative for cough, sputum production and shortness of breath.   Cardiovascular: Negative for chest pain, palpitations and leg swelling.  Gastrointestinal: Negative for abdominal pain, blood in stool, constipation, diarrhea, heartburn, nausea and vomiting.  Genitourinary: Positive for flank pain. Negative for dysuria, frequency, hematuria and urgency.  Musculoskeletal: Positive for back pain. Negative for falls and myalgias.  Skin: Negative for rash.  Neurological: Negative for dizziness, sensory change, loss of consciousness, weakness and headaches.  Endo/Heme/Allergies: Negative for environmental allergies. Does not bruise/bleed easily.  Psychiatric/Behavioral: Negative for depression and suicidal ideas. The patient is not nervous/anxious and does not have insomnia.        Objective:    Physical Exam Vitals and nursing note reviewed. Exam conducted with a chaperone present.  Constitutional:      Appearance: She is well-developed.  HENT:     Head: Normocephalic and atraumatic.  Eyes:     Conjunctiva/sclera: Conjunctivae normal.  Neck:     Thyroid: No thyromegaly.     Vascular: No carotid bruit or JVD.  Cardiovascular:     Rate and Rhythm: Normal rate and regular rhythm.     Heart sounds: Normal heart sounds. No murmur heard.   Pulmonary:     Effort: Pulmonary effort is normal. No respiratory distress.     Breath sounds: Normal breath sounds. No wheezing or rales.  Chest:     Chest wall: No tenderness.  Genitourinary:    General: Normal vulva.     Exam position: Lithotomy position.     Urethra: No prolapse, urethral pain, urethral swelling or urethral lesion.     Vagina: Normal. No lesions or prolapsed vaginal walls.     Cervix: Normal.     Uterus: Normal.   Musculoskeletal:        General: Tenderness present. Normal range of motion.     Cervical back: Normal range of motion and neck supple.      Right lower leg: No edema.     Left lower leg: No edema.  Neurological:     General: No focal deficit present.     Mental Status: She is alert and oriented to person, place, and time.     Motor: No weakness.     Coordination: Coordination normal.     Gait: Gait normal.     Deep Tendon Reflexes: Reflexes normal.     BP 130/80 (  BP Location: Right Arm, Patient Position: Sitting, Cuff Size: Normal)   Pulse 81   Temp 98.1 F (36.7 C) (Oral)   Resp 18   Ht 5\' 4"  (1.626 m)   Wt 143 lb 9.6 oz (65.1 kg)   SpO2 93%   BMI 24.65 kg/m  Wt Readings from Last 3 Encounters:  08/11/20 143 lb 9.6 oz (65.1 kg)  05/28/20 147 lb 8 oz (66.9 kg)  05/22/20 145 lb (65.8 kg)    Diabetic Foot Exam - Simple   No data filed    Lab Results  Component Value Date   WBC 6.1 05/28/2020   HGB 13.1 05/28/2020   HCT 38.8 05/28/2020   PLT 159 05/28/2020   GLUCOSE 111 (H) 05/28/2020   CHOL 179 05/22/2020   TRIG 81.0 05/22/2020   HDL 58.40 05/22/2020   LDLDIRECT 103.3 03/07/2014   LDLCALC 105 (H) 05/22/2020   ALT 17 05/22/2020   AST 22 05/22/2020   NA 140 05/28/2020   K 3.7 05/28/2020   CL 101 05/28/2020   CREATININE 0.67 05/28/2020   BUN 12 05/28/2020   CO2 29 05/28/2020   TSH 1.34 05/22/2020   HGBA1C 6.1 07/20/2012    Lab Results  Component Value Date   TSH 1.34 05/22/2020   Lab Results  Component Value Date   WBC 6.1 05/28/2020   HGB 13.1 05/28/2020   HCT 38.8 05/28/2020   MCV 93.0 05/28/2020   PLT 159 05/28/2020   Lab Results  Component Value Date   NA 140 05/28/2020   K 3.7 05/28/2020   CO2 29 05/28/2020   GLUCOSE 111 (H) 05/28/2020   BUN 12 05/28/2020   CREATININE 0.67 05/28/2020   BILITOT 0.7 05/22/2020   ALKPHOS 71 05/22/2020   AST 22 05/22/2020   ALT 17 05/22/2020   PROT 6.3 05/22/2020   ALBUMIN 4.2 05/22/2020   CALCIUM 8.8 (L) 05/28/2020   ANIONGAP 10 05/28/2020   GFR 78.09 05/22/2020   Lab Results  Component Value Date   CHOL 179 05/22/2020   Lab  Results  Component Value Date   HDL 58.40 05/22/2020   Lab Results  Component Value Date   LDLCALC 105 (H) 05/22/2020   Lab Results  Component Value Date   TRIG 81.0 05/22/2020   Lab Results  Component Value Date   CHOLHDL 3 05/22/2020   Lab Results  Component Value Date   HGBA1C 6.1 07/20/2012       Assessment & Plan:   Problem List Items Addressed This Visit      Unprioritized   Acute right-sided low back pain without sciatica - Primary    Check ls xray  Muscle relaxer / tylenol       Relevant Medications   tiZANidine (ZANAFLEX) 4 MG tablet   Other Relevant Orders   DG Lumbar Spine Complete   Oliguria    Urine culture pending  Some d/c on exam---  Swab done  Pelvic US to be done       Relevant Orders   US PELVIS (TRANSABDOMINAL ONLY)    Other Visit Diagnoses    Urinary frequency       Relevant Orders   POCT Urinalysis Dipstick (Automated) (Completed)   Urine Culture   Vaginal discharge       Relevant Orders   Cervicovaginal ancillary only( [facility])      I am having Ugochi H. Sullivant start on tiZANidine. I am also having her maintain her folic acid, Advair HFA, methotrexate, albuterol,  alendronate, ALPRAZolam, donepezil, levothyroxine, atorvastatin, and Multiple Vitamins-Minerals (PRESERVISION AREDS 2 PO).  Meds ordered this encounter  Medications  . tiZANidine (ZANAFLEX) 4 MG tablet    Sig: Take 1 tablet (4 mg total) by mouth every 6 (six) hours as needed for muscle spasms.    Dispense:  30 tablet    Refill:  0     [name], DO

## 2020-08-11 NOTE — Assessment & Plan Note (Signed)
Check ls xray  Muscle relaxer / tylenol

## 2020-08-11 NOTE — Patient Instructions (Signed)
Acute Urinary Retention, Female  Acute urinary retention is a condition in which a person is unable to pass urine or can only pass a little urine. This condition can happen suddenly and last for a short time. If left untreated, it can become long-term (chronic) and result in kidney damage or other serious complications. What are the causes? This condition may be caused by:  Obstruction or narrowing of the tube that drains the bladder (urethra). This may be caused by surgery, problems with nearby organs, or injury to the bladder or urethra.  Problems with the nerves in the bladder.  Pelvic organ prolapse.  Tumors in the area of the pelvis, bladder, or urethra.  Vaginal childbirth.  Bladder or urinary tract infection.  Constipation.  Certain medicines. What increases the risk? This condition is more likely to develop in women over age 85. Other chronic health conditions can increase the risk of acute urinary retention. These include:  Diseases such as multiple sclerosis.  Spinal cord injuries.  Diabetes.  Degenerative cognitive conditions, such as delirium or dementia.  Psychological conditions. A woman may hold her urine due to trauma or because she does not want to use the bathroom.  History of preexisting urinary retention.  History of prior pelvic surgery, incontinence surgery, or radical pelvic surgery. What are the signs or symptoms? Symptoms of this condition include:  Trouble urinating.  Pain in the lower abdomen. How is this diagnosed? This condition is diagnosed based on a physical exam and your medical history. You may also have other tests, including:  An ultrasound of the bladder or kidneys or both.  Blood tests.  A urine analysis.  Additional tests may be needed, such as a CT scan, MRI, and kidney or bladder function tests. How is this treated? Treatment for this condition may include:  Medicines.  Placing a thin, sterile tube (catheter) into the  bladder to drain urine out of the body. This is called an indwelling urinary catheter. After it is inserted, the catheter is held in place with a small balloon that is filled with sterile water. Urine drains from the catheter into a collection bag outside of the body.  Behavioral therapy.  Treatment for other conditions. If needed, you may be treated in the hospital for kidney function problems or to manage other complications. Follow these instructions at home: Medicines  Take over-the-counter and prescription medicines only as told by your health care provider. Avoid certain medicines, such as decongestants, antihistamines, and some prescription medicines. Do not take any medicine unless your health care provider approves.  If you were prescribed an antibiotic medicine, take it as told by your health care provider. Do not stop using the antibiotic even if you start to feel better. General instructions  Do not use any products that contain nicotine or tobacco. These products include cigarettes, chewing tobacco, and vaping devices, such as e-cigarettes. If you need help quitting, ask your health care provider.  Drink enough fluid to keep your urine pale yellow.  If you have an indwelling urinary catheter, follow the instructions from your health care provider.  Monitor any changes in your symptoms. Tell your health care provider about any changes.  If instructed, monitor your blood pressure at home. Report changes as told by your health care provider.  Keep all follow-up visits. This is important. Contact a health care provider if:  You have uncomfortable bladder contractions that you cannot control (spasms).  You leak urine with the spasms. Get help right away if:  You have chills or a fever.  You have blood in your urine.  You have a catheter and the following happens: ? Your catheter stops draining urine. ? Your catheter falls out. Summary  Acute urinary retention is a  condition in which a person is unable to pass urine or can only pass a little urine. If left untreated, this can result in kidney damage or other serious complications.  One cause of this condition may be obstruction or narrowing of the tube that drains the bladder (urethra). This may be caused by surgery, problems with nearby organs, or injury to the bladder or urethra.  Treatment may include medicines and placement of an indwelling urinary catheter.  Monitor any changes in your symptoms. Tell your health care provider about any changes. This information is not intended to replace advice given to you by your health care provider. Make sure you discuss any questions you have with your health care provider. Document Revised: 01/15/2020 Document Reviewed: 01/15/2020 Elsevier Patient Education  2021 [employer].

## 2020-08-11 NOTE — Assessment & Plan Note (Signed)
Urine culture pending  Some d/c on exam---  Swab done  Pelvic US to be done

## 2020-08-12 ENCOUNTER — Encounter: Payer: Self-pay | Admitting: Family Medicine

## 2020-08-12 ENCOUNTER — Other Ambulatory Visit: Payer: Self-pay

## 2020-08-12 DIAGNOSIS — L408 Other psoriasis: Secondary | ICD-10-CM | POA: Diagnosis not present

## 2020-08-12 DIAGNOSIS — I700 Atherosclerosis of aorta: Secondary | ICD-10-CM | POA: Insufficient documentation

## 2020-08-12 LAB — CERVICOVAGINAL ANCILLARY ONLY
Bacterial Vaginitis (gardnerella): NEGATIVE
Candida Glabrata: POSITIVE — AB
Candida Vaginitis: NEGATIVE
Comment: NEGATIVE
Comment: NEGATIVE
Comment: NEGATIVE

## 2020-08-12 LAB — URINE CULTURE
MICRO NUMBER:: 11732940
SPECIMEN QUALITY:: ADEQUATE

## 2020-08-12 MED ORDER — FLUCONAZOLE 150 MG PO TABS: 150.0000 mg | ORAL_TABLET | Freq: Once | ORAL | 0 refills | Status: AC

## 2020-08-18 ENCOUNTER — Telehealth: Payer: Self-pay | Admitting: Family Medicine

## 2020-08-18 NOTE — Telephone Encounter (Signed)
Caller: Marcayla Call Back @ [phone]  Patient states she is having frequent urination, patient states she just got over a yeast infection. Patient would like to know what to do ?   Please advise

## 2020-08-18 NOTE — Telephone Encounter (Signed)
Appointment made

## 2020-08-18 NOTE — Telephone Encounter (Signed)
Needs ov

## 2020-08-19 ENCOUNTER — Ambulatory Visit: Payer: Medicare HMO | Admitting: Family Medicine

## 2020-08-19 DIAGNOSIS — L408 Other psoriasis: Secondary | ICD-10-CM | POA: Diagnosis not present

## 2020-08-20 ENCOUNTER — Ambulatory Visit: Payer: Medicare HMO | Admitting: Family Medicine

## 2020-08-20 NOTE — Telephone Encounter (Signed)
Patient has an appointment on Monday with Dr.Copland

## 2020-08-20 NOTE — Telephone Encounter (Signed)
[place]  Call Back @ [phone]  Patient states she is not peeing, Patient would like to know if she should take her last diflucan to help.  Please advise

## 2020-08-21 ENCOUNTER — Encounter: Payer: Self-pay | Admitting: Family Medicine

## 2020-08-21 NOTE — Progress Notes (Addendum)
[facility] [address], [place], [place] [address] [phone]  Date:  08/24/2020   Name:  Carrie Hall   DOB:  09-03-1933   MRN:  [id number]  PCP:  [name], DO    Chief Complaint: Urinary Hesitancy  and Foot Swelling (Bilateral feet swelling, right foot worse)   History of Present Illness:  Carrie Hall is a 87 y.o. very pleasant female patient who presents with the following:  Patient seen today with concern for possible UTI I have not seen this patient myself in the past-primary patient of my partner Dr. Etter Sjogren  History of UTI, psoriasis, mild cognitive impairment, hypertension, hyperlipidemia, COPD Pt notes she was recently treated for a yeast infection- it looks like she was given diflucan.  She notes that over the last 1 to 2 weeks she feels like it might be harder to pass her urine-however, she is not having any pain in, no abdominal pain  She also notes that both legs have been swollen for some time- she cannot really say how long.  Right leg is more swollen than the left  They seem to stay swollen all day- however, does get better over night if she elevates her feet  No dysuria She may note some right sided lower back pain off and on No hematuria No fever or chills No vomiting   BNP was checked in January- negative  Weight is stable She has COPD, notes her breathing is at baseline Wt Readings from Last 3 Encounters:  08/24/20 143 lb (64.9 kg)  08/11/20 143 lb 9.6 oz (65.1 kg)  05/28/20 147 lb 8 oz (66.9 kg)   BP Readings from Last 3 Encounters:  08/24/20 (!) 152/86  08/11/20 130/80  05/28/20 (!) 141/58   Patient does note some memory loss which is stable.  She does not drive Patient Active Problem List   Diagnosis Date Noted  . Aortic atherosclerosis ([place]) 08/12/2020  . Oliguria 08/11/2020  . Acute right-sided low back pain without sciatica 08/11/2020  . Psoriasis 11/18/2019   . Essential hypertension 02/12/2018  . Generalized anxiety disorder 01/01/2018  . Mild cognitive impairment 11/10/2017  . Elevated LFTs 10/04/2017  . Loss of weight 10/04/2017  . Loose stools 10/04/2017  . Weight loss, non-intentional 08/07/2017  . Preventative health care 08/07/2017  . Cerumen impaction 08/25/2015  . 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  . Memory loss 08/25/2015  . Diarrhea 12/19/2014  . KNEE PAIN, LEFT 05/27/2010  . DYSURIA 05/27/2010  . POSTMENOPAUSAL STATUS 04/23/2010  . CERVICAL POLYP 10/26/2009  . HEMATURIA, HX OF 10/26/2009  . DIZZINESS 06/11/2009  . FOOT, PAIN 11/17/2008  . OTHER ABNORMAL BLOOD CHEMISTRY 11/17/2008  . TINEA CRURIS 09/15/2008  . UTI 09/15/2008  . COLONIC POLYPS, ADENOMATOUS, HX OF 01/16/2008  . HEMOCCULT POSITIVE STOOL 12/11/2007  . BREAST CANCER, HX OF 12/11/2007  . Hypothyroidism 12/07/2007  . Hyperlipidemia LDL goal <100 12/07/2007  . COPD with chronic bronchitis ([place]) 12/07/2007  . Disorder of bone and articular cartilage 12/07/2007  . URI 03/13/2007  . HX, URINARY INFECTION 01/09/2007    Past Medical History:  Diagnosis Date  . Adenomatous colon polyp   . Breast cancer ([place])   . COPD (chronic obstructive pulmonary disease) ([place])   . Diverticulosis   . Hyperlipidemia   . Internal hemorrhoids   . Osteopenia   . Thyroid disease     Past Surgical History:  Procedure Laterality Date  . BREAST LUMPECTOMY Left    radiation only  . BREAST SURGERY    . colon polyps      Social History   Tobacco Use  . Smoking status: Former Smoker    Packs/day: 0.50    Types: Cigarettes    Quit date: 12/06/2003    Years since quitting: 16.7  . Smokeless tobacco: Never Used  Vaping Use  . Vaping Use: Never used  Substance Use Topics  . Alcohol use: No  . Drug use: No    Family History  Problem Relation Age of Onset  . Alzheimer's disease Mother   . Cancer Father        lung  . Alzheimer's disease Maternal Grandmother   .  Rectal cancer Sister        in her 70s    Allergies  Allergen Reactions  . Pneumococcal Vaccines     Medication list has been reviewed and updated.  Current Outpatient Medications on File Prior to Visit  Medication Sig Dispense Refill  . albuterol (VENTOLIN HFA) 108 (90 Base) MCG/ACT inhaler Inhale 2 puffs into the lungs every 4 (four) hours as needed for wheezing or shortness of breath. 54 g 1  . alendronate (FOSAMAX) 70 MG tablet Take 1 tablet (70 mg total) by mouth every 7 (seven) days. Take with a full glass of water on an empty stomach. 12 tablet 3  . ALPRAZolam (XANAX) 0.25 MG tablet Take 1 tablet (0.25 mg total) by mouth at bedtime as needed for anxiety. 30 tablet 0  . atorvastatin (LIPITOR) 10 MG tablet Take 1 tablet (10 mg total) by mouth at bedtime. 90 tablet 1  . donepezil (ARICEPT ODT) 10 MG disintegrating tablet Take 1 tablet (10 mg total) by mouth at bedtime. 90 tablet 3  . fluticasone-salmeterol (ADVAIR HFA) 115-21 MCG/ACT inhaler Inhale 2 puffs into the lungs 2 (two) times daily. 1 Inhaler 12  . folic acid (FOLVITE) 1 MG tablet Take 1 mg by mouth daily.    Marland Kitchen levothyroxine (SYNTHROID) 88 MCG tablet TAKE ONE TABLET BY MOUTH BEFORE BREAKFAST 90 tablet 1  . methotrexate 50 MG/2ML injection SMARTSIG:0.6 Milliliter(s) IM Once a Week    . Multiple Vitamins-Minerals (PRESERVISION AREDS 2 PO) Take 1 tablet by mouth 2 (two) times daily.    Marland Kitchen tiZANidine (ZANAFLEX) 4 MG tablet Take 1 tablet (4 mg total) by mouth every 6 (six) hours as needed for muscle spasms. 30 tablet 0   No current facility-administered medications on file prior to visit.    Review of Systems:  As per HPI- otherwise negative.   Physical Examination: Vitals:   08/24/20 0918  BP: (!) 152/86  Pulse: 62  Resp: 19  Temp: 97.6 F (36.4 C)  SpO2: 92%   Vitals:   08/24/20 0918  Weight: 143 lb (64.9 kg)  Height: 5\' 4"  (1.626 m)   Body mass index is 24.55 kg/m. Ideal Body Weight: Weight in (lb) to have  BMI = 25: 145.3  GEN: no acute distress.  Normal weight, looks well HEENT: Atraumatic, Normocephalic.  Ears and Nose: No external deformity. CV: RRR, No M/G/R. No JVD. No thrill. No extra heart sounds. PULM: CTA B, no wheezes, crackles, rhonchi. No retractions. No resp. distress. No accessory muscle use.  Benign belly ABD: S, NT, ND, +BS. No rebound. No HSM. EXTR: No c/c/e PSYCH: Normally interactive. Conversant.  She has 1+ lower extremity edema on the right, trace on the left No calf tenderness  Results for orders placed or performed in visit on 08/24/20  POCT urinalysis dipstick  Result Value Ref Range   Color, UA yellow yellow   Clarity, UA cloudy (A) clear   Glucose, UA negative negative mg/dL   Bilirubin, UA negative negative   Ketones, POC UA negative negative mg/dL   Spec Grav, UA 1.015 1.010 - 1.025   Blood, UA negative negative   pH, UA 7.0 5.0 - 8.0   Protein Ur, POC negative negative mg/dL   Urobilinogen, UA 0.2 0.2 or 1.0 E.U./dL   Nitrite, UA Negative Negative   Leukocytes, UA Trace (A) Negative     Assessment and Plan: Urinary frequency - Plan: Urine Culture, POCT urinalysis dipstick, Basic metabolic panel  Localized swelling of right lower extremity - Plan: US Venous Img Lower Unilateral Right  Patient here today with a couple concerns.  She notes possible UTI symptoms-UA is relatively benign.  Culture is pending We will also check a BMP to check on her renal function Suspect lower extremity swelling is due to venous insufficiency.  However, as the swelling is worse on the right side will also obtain an ultrasound of her right leg I discussed this plan with her daughter-in-law, Neoma Laming who brought her to the appointment and who was waiting in the car This visit occurred during the SARS-CoV-2 public health emergency.  Safety protocols were in place, including screening questions prior to the visit, additional usage of staff PPE, and extensive cleaning of exam  room while observing appropriate contact time as indicated for disinfecting solutions.    Signed Lamar Blinks, MD  Received urine culture 4/20- message to pt Results for orders placed or performed in visit on 08/24/20  Urine Culture   Specimen: Urine  Result Value Ref Range   MICRO NUMBER: [id number]    SPECIMEN QUALITY: Adequate    Sample Source NOT GIVEN    STATUS: FINAL    ISOLATE 1:      Less than 10,000 CFU/mL of single Gram negative organism isolated. No further testing will be performed. If clinically indicated, recollection using a method to minimize contamination, with prompt transfer to Urine Culture Transport Tube, is recommended.  Basic metabolic panel  Result Value Ref Range   Sodium 142 135 - 145 mEq/L   Potassium 4.3 3.5 - 5.1 mEq/L   Chloride 101 96 - 112 mEq/L   CO2 35 (H) 19 - 32 mEq/L   Glucose, Bld 87 70 - 99 mg/dL   BUN 13 6 - 23 mg/dL   Creatinine, Ser 0.64 0.40 - 1.20 mg/dL   GFR 81.01 >60.00 mL/min   Calcium 9.2 8.4 - 10.5 mg/dL  POCT urinalysis dipstick  Result Value Ref Range   Color, UA yellow yellow   Clarity, UA cloudy (A) clear   Glucose, UA negative negative mg/dL   Bilirubin, UA negative negative   Ketones, POC UA negative negative mg/dL   Spec Grav, UA 1.015 1.010 - 1.025   Blood, UA negative negative   pH, UA 7.0 5.0 - 8.0   Protein Ur, POC negative negative mg/dL   Urobilinogen, UA 0.2 0.2 or 1.0 E.U./dL   Nitrite, UA Negative Negative   Leukocytes, UA Trace (A) Negative   Negative for infection

## 2020-08-24 ENCOUNTER — Encounter (HOSPITAL_BASED_OUTPATIENT_CLINIC_OR_DEPARTMENT_OTHER): Payer: Self-pay | Admitting: *Deleted

## 2020-08-24 ENCOUNTER — Other Ambulatory Visit: Payer: Self-pay

## 2020-08-24 ENCOUNTER — Encounter: Payer: Self-pay | Admitting: Family Medicine

## 2020-08-24 ENCOUNTER — Ambulatory Visit (HOSPITAL_BASED_OUTPATIENT_CLINIC_OR_DEPARTMENT_OTHER)
Admission: RE | Admit: 2020-08-24 | Discharge: 2020-08-24 | Disposition: A | Discharge status: Home / Self Care | Payer: Medicare HMO | Source: Ambulatory Visit | Attending: Family Medicine | Admitting: Family Medicine

## 2020-08-24 ENCOUNTER — Ambulatory Visit (INDEPENDENT_AMBULATORY_CARE_PROVIDER_SITE_OTHER): Payer: Medicare HMO | Admitting: Family Medicine

## 2020-08-24 ENCOUNTER — Emergency Department (HOSPITAL_BASED_OUTPATIENT_CLINIC_OR_DEPARTMENT_OTHER)
Admission: EM | Admit: 2020-08-24 | Discharge: 2020-08-24 | Disposition: A | Discharge status: Home / Self Care | Payer: Medicare HMO | Attending: Family Medicine | Admitting: Family Medicine

## 2020-08-24 VITALS — BP 152/86 | HR 62 | Temp 97.6°F | Resp 19 | Ht 64.0 in | Wt 143.0 lb

## 2020-08-24 DIAGNOSIS — Z5321 Procedure and treatment not carried out due to patient leaving prior to being seen by health care provider: Secondary | ICD-10-CM | POA: Diagnosis not present

## 2020-08-24 DIAGNOSIS — R2241 Localized swelling, mass and lump, right lower limb: Secondary | ICD-10-CM

## 2020-08-24 DIAGNOSIS — R350 Frequency of micturition: Secondary | ICD-10-CM | POA: Diagnosis not present

## 2020-08-24 DIAGNOSIS — M79604 Pain in right leg: Secondary | ICD-10-CM | POA: Insufficient documentation

## 2020-08-24 DIAGNOSIS — M79605 Pain in left leg: Secondary | ICD-10-CM | POA: Diagnosis not present

## 2020-08-24 DIAGNOSIS — M7989 Other specified soft tissue disorders: Secondary | ICD-10-CM | POA: Diagnosis not present

## 2020-08-24 LAB — POCT URINALYSIS DIP (MANUAL ENTRY)
Bilirubin, UA: NEGATIVE
Blood, UA: NEGATIVE
Glucose, UA: NEGATIVE mg/dL
Ketones, POC UA: NEGATIVE mg/dL
Nitrite, UA: NEGATIVE
Protein Ur, POC: NEGATIVE mg/dL
Spec Grav, UA: 1.015 (ref 1.010–1.025)
Urobilinogen, UA: 0.2 E.U./dL
pH, UA: 7 (ref 5.0–8.0)

## 2020-08-24 LAB — BASIC METABOLIC PANEL
BUN: 13 mg/dL (ref 6–23)
CO2: 35 mEq/L — ABNORMAL HIGH (ref 19–32)
Calcium: 9.2 mg/dL (ref 8.4–10.5)
Chloride: 101 mEq/L (ref 96–112)
Creatinine, Ser: 0.64 mg/dL (ref 0.40–1.20)
GFR: 81.01 mL/min (ref >60.00)
Glucose, Bld: 87 mg/dL (ref 70–99)
Potassium: 4.3 mEq/L (ref 3.5–5.1)
Sodium: 142 mEq/L (ref 135–145)

## 2020-08-24 NOTE — Patient Instructions (Signed)
Please stop by the lab for a blood draw Your uinalysis does not look bad- I am sending it for a culture and will be in touch with the results   I am a bit concerned about the uneven swelling in your legs- this could indicate a blood clot. I have scheduled you for an ultrasound of your right leg- right now scheduled for the imaging dept on the ground floor here this evening at 5:30 pm  If this does not work for you, you can stop by and reschedule

## 2020-08-24 NOTE — ED Triage Notes (Signed)
Pain in both legs for "a while". She saw her MD today and she wanted her to come to the ER to r/o DVT right lower leg. Pt is ambulatory.

## 2020-08-25 LAB — URINE CULTURE
MICRO NUMBER:: 11781278
SPECIMEN QUALITY:: ADEQUATE

## 2020-08-26 ENCOUNTER — Encounter: Payer: Self-pay | Admitting: Family Medicine

## 2020-08-26 DIAGNOSIS — L408 Other psoriasis: Secondary | ICD-10-CM | POA: Diagnosis not present

## 2020-08-31 ENCOUNTER — Telehealth: Payer: Self-pay | Admitting: Pharmacist

## 2020-08-31 NOTE — Progress Notes (Addendum)
Chronic Care Management Pharmacy Assistant   Name: Carrie Hall  MRN: [id number] DOB: 08/19/1933  Reason for Encounter: Medication Review/Medication Coordination Call.   Medications: Outpatient Encounter Medications as of 08/31/2020  Medication Sig   albuterol (VENTOLIN HFA) 108 (90 Base) MCG/ACT inhaler Inhale 2 puffs into the lungs every 4 (four) hours as needed for wheezing or shortness of breath.   alendronate (FOSAMAX) 70 MG tablet Take 1 tablet (70 mg total) by mouth every 7 (seven) days. Take with a full glass of water on an empty stomach.   ALPRAZolam (XANAX) 0.25 MG tablet Take 1 tablet (0.25 mg total) by mouth at bedtime as needed for anxiety.   atorvastatin (LIPITOR) 10 MG tablet Take 1 tablet (10 mg total) by mouth at bedtime.   donepezil (ARICEPT ODT) 10 MG disintegrating tablet Take 1 tablet (10 mg total) by mouth at bedtime.   fluticasone-salmeterol (ADVAIR HFA) 115-21 MCG/ACT inhaler Inhale 2 puffs into the lungs 2 (two) times daily.   folic acid (FOLVITE) 1 MG tablet Take 1 mg by mouth daily.   levothyroxine (SYNTHROID) 88 MCG tablet TAKE ONE TABLET BY MOUTH BEFORE BREAKFAST   methotrexate 50 MG/2ML injection SMARTSIG:0.6 Milliliter(s) IM Once a Week   Multiple Vitamins-Minerals (PRESERVISION AREDS 2 PO) Take 1 tablet by mouth 2 (two) times daily.   tiZANidine (ZANAFLEX) 4 MG tablet Take 1 tablet (4 mg total) by mouth every 6 (six) hours as needed for muscle spasms.   No facility-administered encounter medications on file as of 08/31/2020.    Reviewed chart for medication changes ahead of medication coordination call.  No Consults, or hospital visits since last care coordination call.  Office Visits: 08/24/20 Copland, Gay Filler, MD. For UTI. No medication changes. Per note: Patient was having swelling in her legs. 08/11/20 [name], DO. For Acute right sided low back pain. STARTED Tizanidine 4 mg  every 6 hours PRN.(Per patient she is not taking this  medication)  BP Readings from Last 3 Encounters:  08/24/20 (!) 152/86  08/11/20 130/80  05/28/20 (!) 141/58    Lab Results  Component Value Date   HGBA1C 6.1 07/20/2012     Patient obtains medications through Vials  90 Days   Last adherence delivery included:  Folic Acid 1 mg; one tab at breakfast Donepezil 10 mg; one tab at bedtime  Patient declined meds last month: Famotidine 20 mg; one tab twice daily (Not taking Per Patient) Atorvastatin 10mg : one tab with Breakfast (enough on hand 90 DS 07/10/19) Levothyroxine 88 mcg; one tab at breakfast (enough on hand 90 DS 07/10/19) Alendronate 70 mg; one tab on Monday weekly (enough on hand 90 DS 07/10/19) Methotrexate injection 25 mg/ml; 0.6 ml injected every week (enough on hand 90 DS 07/10/19) Advair inhaler 115/21; inhale two puffs into the lungs twice a day (enough on hand 90 DS 07/10/19)  Patient is not due for an adherence delivery at this time.  Called patient and reviewed medications and coordinated delivery.  This delivery to include: None  Patient declined the following medications: ( enough on hand) Famotidine 20 mg; one tab twice daily (Not taking Per Patient) Atorvastatin 10mg : one tab with Breakfast  Levothyroxine 88 mcg; one tab at breakfast  Alendronate 70 mg; one tab on Monday weekly  Methotrexate injection 25 mg/ml; 0.6 ml injected every week  Advair inhaler 115/21; inhale two puffs into the lungs twice a day  Folic Acid 1 mg; one tab at breakfast Donepezil 10 mg;  one tab at bedtime  Patient dont need refills at this time.   Follow-Up:Pharmacist Review   Charlann Lange, RMA Clinical Pharmacist Assistant [phone]  7 minutes spent in review, coordination, and documentation.  Reviewed by: Beverly Milch, PharmD Clinical Pharmacist [facility] [phone]

## 2020-09-02 DIAGNOSIS — L408 Other psoriasis: Secondary | ICD-10-CM | POA: Diagnosis not present

## 2020-09-09 DIAGNOSIS — L408 Other psoriasis: Secondary | ICD-10-CM | POA: Diagnosis not present

## 2020-09-16 DIAGNOSIS — L408 Other psoriasis: Secondary | ICD-10-CM | POA: Diagnosis not present

## 2020-09-24 ENCOUNTER — Telehealth: Payer: Self-pay | Admitting: Family Medicine

## 2020-09-24 ENCOUNTER — Other Ambulatory Visit: Payer: Self-pay

## 2020-09-24 DIAGNOSIS — M8580 Other specified disorders of bone density and structure, unspecified site: Secondary | ICD-10-CM

## 2020-09-24 MED ORDER — ALENDRONATE SODIUM 70 MG PO TABS: 70.0000 mg | ORAL_TABLET | ORAL | 3 refills | Status: DC

## 2020-09-24 NOTE — Telephone Encounter (Signed)
Refill sent.

## 2020-09-24 NOTE — Telephone Encounter (Signed)
Medication: alendronate (FOSAMAX) 70 MG tablet [[id number]]      Has the patient contacted their pharmacy? yes  (If no, request that the patient contact the pharmacy for the refill.) (If yes, when and what did the pharmacy advise?) Contact your doc ---no more refills    Preferred Pharmacy (with phone number or street name):[facility] - [place], [place] - [place] [address]  [address], [place] [address]  Phone:  [phone] Fax:  [phone]      Agent: Please be advised that RX refills may take up to 3 business days. We ask that you follow-up with your pharmacy.

## 2020-09-30 DIAGNOSIS — L408 Other psoriasis: Secondary | ICD-10-CM | POA: Diagnosis not present

## 2020-09-30 DIAGNOSIS — L400 Psoriasis vulgaris: Secondary | ICD-10-CM | POA: Diagnosis not present

## 2020-10-01 ENCOUNTER — Other Ambulatory Visit: Payer: Self-pay | Admitting: Family Medicine

## 2020-10-01 DIAGNOSIS — E039 Hypothyroidism, unspecified: Secondary | ICD-10-CM

## 2020-10-05 DIAGNOSIS — R69 Illness, unspecified: Secondary | ICD-10-CM | POA: Diagnosis not present

## 2020-10-05 DIAGNOSIS — E663 Overweight: Secondary | ICD-10-CM | POA: Diagnosis not present

## 2020-10-05 DIAGNOSIS — M069 Rheumatoid arthritis, unspecified: Secondary | ICD-10-CM | POA: Diagnosis not present

## 2020-10-05 DIAGNOSIS — G309 Alzheimer's disease, unspecified: Secondary | ICD-10-CM | POA: Diagnosis not present

## 2020-10-05 DIAGNOSIS — Z008 Encounter for other general examination: Secondary | ICD-10-CM | POA: Diagnosis not present

## 2020-10-05 DIAGNOSIS — J449 Chronic obstructive pulmonary disease, unspecified: Secondary | ICD-10-CM | POA: Diagnosis not present

## 2020-10-05 DIAGNOSIS — E785 Hyperlipidemia, unspecified: Secondary | ICD-10-CM | POA: Diagnosis not present

## 2020-10-05 DIAGNOSIS — M810 Age-related osteoporosis without current pathological fracture: Secondary | ICD-10-CM | POA: Diagnosis not present

## 2020-10-05 DIAGNOSIS — I739 Peripheral vascular disease, unspecified: Secondary | ICD-10-CM | POA: Diagnosis not present

## 2020-10-05 DIAGNOSIS — E039 Hypothyroidism, unspecified: Secondary | ICD-10-CM | POA: Diagnosis not present

## 2020-10-05 DIAGNOSIS — L409 Psoriasis, unspecified: Secondary | ICD-10-CM | POA: Diagnosis not present

## 2020-10-07 ENCOUNTER — Telehealth: Payer: Self-pay | Admitting: Family Medicine

## 2020-10-07 ENCOUNTER — Other Ambulatory Visit: Payer: Self-pay

## 2020-10-07 DIAGNOSIS — J410 Simple chronic bronchitis: Secondary | ICD-10-CM

## 2020-10-07 DIAGNOSIS — L408 Other psoriasis: Secondary | ICD-10-CM | POA: Diagnosis not present

## 2020-10-07 MED ORDER — ADVAIR HFA 115-21 MCG/ACT IN AERO: 2.0000 | INHALATION_SPRAY | Freq: Two times a day (BID) | RESPIRATORY_TRACT | 12 refills | Status: DC

## 2020-10-07 NOTE — Telephone Encounter (Signed)
Medication: fluticasone-salmeterol (ADVAIR HFA) 115-21 MCG/ACT inhaler [[id number]]     Has the patient contacted their pharmacy? no (If no, request that the patient contact the pharmacy for the refill.) (If yes, when and what did the pharmacy advise?)    Preferred Pharmacy (with phone number or street name): [facility] - [place], [place] - [place] [address]  [address], [place] [address]  Phone:  [phone] Fax:  [phone]     Agent: Please be advised that RX refills may take up to 3 business days. We ask that you follow-up with your pharmacy.

## 2020-10-07 NOTE — Telephone Encounter (Signed)
Refilled

## 2020-10-12 DIAGNOSIS — H353132 Nonexudative age-related macular degeneration, bilateral, intermediate dry stage: Secondary | ICD-10-CM | POA: Diagnosis not present

## 2020-10-12 DIAGNOSIS — H35033 Hypertensive retinopathy, bilateral: Secondary | ICD-10-CM | POA: Diagnosis not present

## 2020-10-12 DIAGNOSIS — H5201 Hypermetropia, right eye: Secondary | ICD-10-CM | POA: Diagnosis not present

## 2020-10-12 DIAGNOSIS — H524 Presbyopia: Secondary | ICD-10-CM | POA: Diagnosis not present

## 2020-10-12 DIAGNOSIS — H52223 Regular astigmatism, bilateral: Secondary | ICD-10-CM | POA: Diagnosis not present

## 2020-10-14 DIAGNOSIS — L408 Other psoriasis: Secondary | ICD-10-CM | POA: Diagnosis not present

## 2020-11-13 ENCOUNTER — Ambulatory Visit: Payer: Medicare HMO | Admitting: Neurology

## 2020-11-19 ENCOUNTER — Ambulatory Visit: Payer: Medicare HMO | Admitting: Family Medicine

## 2020-11-25 NOTE — Progress Notes (Signed)
Subjective:   Carrie Hall is a 87 y.o. female who presents for Medicare Annual (Subsequent) preventive examination.    Review of Systems     Cardiac Risk Factors include: advanced age (>19men, >88 women);dyslipidemia;hypertension;sedentary lifestyle     Objective:    Today's Vitals   11/26/20 0856  BP: 118/68  Pulse: 60  Resp: 16  Temp: 97.8 F (36.6 C)  TempSrc: Temporal  SpO2: 97%  Weight: 147 lb 12.8 oz (67 kg)  Height: 5\' 4"  (1.626 m)   Body mass index is 25.37 kg/m.  Advanced Directives 11/26/2020 08/24/2020 05/28/2020 03/05/2020 11/13/2019 09/02/2019 01/29/2019  Does Patient Have a Medical Advance Directive? No No No Yes No Yes No  Type of Advance Directive - - Public librarian;Living will;Out of facility DNR (pink MOST or yellow form) - [place];Living will -  Would patient like information on creating a medical advance directive? Yes (MAU/Ambulatory/Procedural Areas - Information given) No - Patient declined No - Patient declined - No - Patient declined - No - Patient declined    Current Medications (verified) Outpatient Encounter Medications as of 11/26/2020  Medication Sig   albuterol (VENTOLIN HFA) 108 (90 Base) MCG/ACT inhaler Inhale 2 puffs into the lungs every 4 (four) hours as needed for wheezing or shortness of breath.   alendronate (FOSAMAX) 70 MG tablet Take 1 tablet (70 mg total) by mouth every 7 (seven) days. Take with a full glass of water on an empty stomach.   ALPRAZolam (XANAX) 0.25 MG tablet Take 1 tablet (0.25 mg total) by mouth at bedtime as needed for anxiety.   atorvastatin (LIPITOR) 10 MG tablet Take 1 tablet (10 mg total) by mouth at bedtime.   donepezil (ARICEPT ODT) 10 MG disintegrating tablet Take 1 tablet (10 mg total) by mouth at bedtime.   fluticasone-salmeterol (ADVAIR HFA) 115-21 MCG/ACT inhaler Inhale 2 puffs into the lungs 2 (two) times daily.   folic acid (FOLVITE) 1 MG tablet Take 1 mg by mouth  daily.   levothyroxine (SYNTHROID) 88 MCG tablet TAKE ONE TABLET BY MOUTH BEFORE BREAKFAST   methotrexate 50 MG/2ML injection SMARTSIG:0.6 Milliliter(s) IM Once a Week   Multiple Vitamins-Minerals (PRESERVISION AREDS 2 PO) Take 1 tablet by mouth 2 (two) times daily.   tiZANidine (ZANAFLEX) 4 MG tablet Take 1 tablet (4 mg total) by mouth every 6 (six) hours as needed for muscle spasms.   No facility-administered encounter medications on file as of 11/26/2020.    Allergies (verified) Pneumococcal vaccines   History: Past Medical History:  Diagnosis Date   Adenomatous colon polyp    Breast cancer ([place])    COPD (chronic obstructive pulmonary disease) ([place])    Diverticulosis    Hyperlipidemia    Internal hemorrhoids    Osteopenia    Thyroid disease    Past Surgical History:  Procedure Laterality Date   BREAST LUMPECTOMY Left    radiation only   BREAST SURGERY     colon polyps     Family History  Problem Relation Age of Onset   Alzheimer's disease Mother    Cancer Father        lung   Alzheimer's disease Maternal Grandmother    Rectal cancer Sister        in her 37s   Social History   Socioeconomic History   Marital status: Widowed    Spouse name: Not on file   Number of children: Not on file   Years of education:  Not on file   Highest education level: Not on file  Occupational History   Not on file  Tobacco Use   Smoking status: Former    Packs/day: 0.50    Types: Cigarettes    Quit date: 12/06/2003    Years since quitting: 16.9   Smokeless tobacco: Never  Vaping Use   Vaping Use: Never used  Substance and Sexual Activity   Alcohol use: No   Drug use: No   Sexual activity: Never    Partners: Male  Other Topics Concern   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   Right handed    Social Determinants of Systems developer Strain: Low Risk    Difficulty of Paying Living Expenses: Not hard at all  Food Insecurity: No Food Insecurity   Worried About Charity fundraiser in the Last Year: Never true   Arboriculturist in the Last Year: Never true  Transportation Needs: No Transportation Needs   Lack of Transportation (Medical): No   Lack of Transportation (Non-Medical): No  Physical Activity: Inactive   Days of Exercise per Week: 0 days   Minutes of Exercise per Session: 0 min  Stress: Stress Concern Present   Feeling of Stress : To some extent  Social Connections: Moderately Isolated   Frequency of Communication with Friends and Family: More than three times a week   Frequency of Social Gatherings with Friends and Family: More than three times a week   Attends Religious Services: More than 4 times per year   Active Member of [employer] or Organizations: No   Attends Archivist Meetings: Never   Marital Status: Widowed    Tobacco Counseling Counseling given: Not Answered   Clinical Intake:  Pre-visit preparation completed: Yes  Pain : No/denies pain     Nutritional Status: BMI 25 -29 Overweight Nutritional Risks: None Diabetes: No  How often do you need to have someone help you when you read instructions, pamphlets, or other written materials from your doctor or pharmacy?: 1 - Never  Diabetic?No  Interpreter Needed?: No  Information entered by :: Caroleen Hamman LPN   Activities of Daily Living In your present state of health, do you have any difficulty performing the following activities: 11/26/2020  Hearing? N  Vision? N  Difficulty concentrating or making decisions? N  Comment Taking aricept  Walking or climbing stairs? N  Dressing or bathing? N  Doing errands, shopping? N  Preparing Food and eating ? N  Using the Toilet? N  In the past six months, have you accidently leaked urine? Y  Do you have problems with loss of bowel control? N  Managing your Medications? N   Managing your Finances? N  Housekeeping or managing your Housekeeping? N  Some recent data might be hidden    Patient Care Team: Carollee Herter, Alferd Apa, DO as PCP - General Delice Lesch Lezlie Octave, MD as Consulting Physician (Neurology) Hollar, Katharine Look, MD as Referring Physician (Dermatology) Cherre Robins, PharmD (Pharmacist)  Indicate any recent Medical Services you may have received from other than [facility] providers in the past year (date may be approximate).     Assessment:   This is a routine wellness examination for Carrie Hall.  Hearing/Vision screen Hearing Screening - Comments:: C/O mild hearing  loss-Left is worse Vision Screening - Comments:: Reading glasses Last eye exam-2022-Dr. Clydene Laming  Dietary issues and exercise activities discussed: Current Exercise Habits: The patient does not participate in regular exercise at present, Exercise limited by: None identified   Goals Addressed               This Visit's Progress     Patient Stated     maintain independence and current health (pt-stated)   On track      Depression Screen PHQ 2/9 Scores 11/26/2020 11/13/2019 05/07/2019 10/29/2018 10/27/2017 08/07/2017 01/28/2016  PHQ - 2 Score 0 0 0 0 0 0 0    Fall Risk Fall Risk  11/26/2020 07/28/2020 03/05/2020 11/13/2019 09/02/2019  Falls in the past year? 0 0 0 0 0  Number falls in past yr: 0 - 0 0 0  Injury with Fall? 0 - 0 0 0  Risk for fall due to : - Impaired balance/gait - - -  Risk for fall due to: Comment - uses cane to walk - - -  Follow up Falls prevention discussed Falls evaluation completed - Education provided;Falls prevention discussed -    FALL RISK PREVENTION PERTAINING TO THE HOME:  Any stairs in or around the home? No  Home free of loose throw rugs in walkways, pet beds, electrical cords, etc? Yes  Adequate lighting in your home to reduce risk of falls? Yes   ASSISTIVE DEVICES UTILIZED TO PREVENT FALLS:  Life alert? No  Use of a cane, walker or w/c? No  Grab bars  in the bathroom? Yes  Shower chair or bench in shower? No  Elevated toilet seat or a handicapped toilet? No   TIMED UP AND GO:  Was the test performed? Yes .  Length of time to ambulate 10 feet: 10 sec.   Gait slow and steady without use of assistive device  Cognitive Function:Patient sees a neurologist for mild dementia. MMSE - Mini Mental State Exam 03/05/2020 09/02/2019 11/01/2017 10/27/2017  Orientation to time 4 3 5 4   Orientation to Place 4 4 4 5   Registration 3 3 3 3   Attention/ Calculation 2 0 3 3  Recall 3 3 1 1   Language- name 2 objects 2 2 2 2   Language- repeat 0 1 1 1   Language- follow 3 step command 3 2 3 3   Language- read & follow direction 1 1 1 1   Write a sentence 0 0 1 1  Copy design 1 1 1 1   Total score 23 20 25 25    Montreal Cognitive Assessment  01/31/2019 06/22/2018  Visuospatial/ Executive (0/5) - 3  Naming (0/3) - 3  Attention: Read list of digits (0/2) 1 1  Attention: Read list of letters (0/1) 0 0  Attention: Serial 7 subtraction starting at 100 (0/3) 0 0  Language: Repeat phrase (0/2) 1 0  Language : Fluency (0/1) 0 0  Abstraction (0/2) 0 0  Delayed Recall (0/5) 3 0  Orientation (0/6) 4 6  Total - 13  Adjusted Score (based on education) - 14   6CIT Screen 11/13/2019  What Year? 0 points  What month? 0 points  What time? 0 points  Count back from 20 0 points  Months in reverse (No Data)  Repeat phrase 0 points    Immunizations Immunization History  Administered Date(s) Administered   Fluad Quad(high Dose 65+) 03/11/2019, 03/23/2020   Influenza Split 03/15/2011   Influenza Whole 03/21/2007, 02/04/2008, 02/25/2009, 03/11/2010, 02/07/2012   Influenza, High Dose Seasonal PF  03/07/2014, 01/28/2016, 02/22/2017, 01/13/2018   Influenza,inj,Quad PF,6+ Mos 02/14/2013, 02/26/2015   PFIZER(Purple Top)SARS-COV-2 Vaccination 08/01/2019, 08/26/2019   Pneumococcal Conjugate-13 03/07/2014   Pneumococcal Polysaccharide-23 05/15/2006, 12/11/2007, 02/12/2018    Td 02/24/2004   Tdap 08/07/2017   Zoster, Live 11/02/2009    TDAP status: Up to date  Flu Vaccine status: Up to date  Pneumococcal vaccine status: Up to date  Covid-19 vaccine status: Information provided on how to obtain vaccines. Booster due  Qualifies for Shingles Vaccine? Yes   Zostavax completed Yes   Shingrix Completed?: No.    Education has been provided regarding the importance of this vaccine. Patient has been advised to call insurance company to determine out of pocket expense if they have not yet received this vaccine. Advised may also receive vaccine at local pharmacy or Health Dept. Verbalized acceptance and understanding.  Screening Tests Health Maintenance  Topic Date Due   Zoster Vaccines- Shingrix (1 of 2) Never done   MAMMOGRAM  03/13/2018   COVID-19 Vaccine (3 - Pfizer risk series) 09/23/2019   INFLUENZA VACCINE  12/07/2020   TETANUS/TDAP  08/08/2027   DEXA SCAN  Completed   PNA vac Low Risk Adult  Completed   HPV VACCINES  Aged Out    Health Maintenance  Health Maintenance Due  Topic Date Due   Zoster Vaccines- Shingrix (1 of 2) Never done   MAMMOGRAM  03/13/2018   COVID-19 Vaccine (3 - Pfizer risk series) 09/23/2019    Colorectal cancer screening: No longer required.   Mammogram status: Declined  Bone Density status: Declined  Lung Cancer Screening: (Low Dose CT Chest recommended if Age 51-80 years, 30 pack-year currently smoking OR have quit w/in 15years.) does not qualify.    Additional Screening:  Hepatitis C Screening: does not qualify  Vision Screening: Recommended annual ophthalmology exams for early detection of glaucoma and other disorders of the eye. Is the patient up to date with their annual eye exam?  Yes  Who is the provider or what is the name of the office in which the patient attends annual eye exams? Dr. Clydene Laming   Dental Screening: Recommended annual dental exams for proper oral hygiene  Community Resource Referral /  Chronic Care Management: CRR required this visit?  No   CCM required this visit?  No      Plan:     I have personally reviewed and noted the following in the patient's chart:   Medical and social history Use of alcohol, tobacco or illicit drugs  Current medications and supplements including opioid prescriptions.  Functional ability and status Nutritional status Physical activity Advanced directives List of other physicians Hospitalizations, surgeries, and ER visits in previous 12 months Vitals Screenings to include cognitive, depression, and falls Referrals and appointments  In addition, I have reviewed and discussed with patient certain preventive protocols, quality metrics, and best practice recommendations. A written personalized care plan for preventive services as well as general preventive health recommendations were provided to patient.      Marta Antu, LPN   09/23/15  Nurse Health Advisor  Nurse Notes: Patient c/o Nocturia. She has an appt to see PCP tomorrow.

## 2020-11-26 ENCOUNTER — Ambulatory Visit (INDEPENDENT_AMBULATORY_CARE_PROVIDER_SITE_OTHER): Payer: Medicare HMO

## 2020-11-26 ENCOUNTER — Other Ambulatory Visit: Payer: Self-pay

## 2020-11-26 VITALS — BP 118/68 | HR 60 | Temp 97.8°F | Resp 16 | Ht 64.0 in | Wt 147.8 lb

## 2020-11-26 DIAGNOSIS — Z0000 Encounter for general adult medical examination without abnormal findings: Secondary | ICD-10-CM

## 2020-11-26 NOTE — Patient Instructions (Signed)
Ms. Winterrowd , Thank you for taking time to come for your Medicare Wellness Visit. I appreciate your ongoing commitment to your health goals. Please review the following plan we discussed and let me know if I can assist you in the future.   Screening recommendations/referrals: Colonoscopy: No longer required Mammogram: Declined.  Please call the office to schedule if you change your mind. Bone Density: Declined.  Please call the office to schedule if you change your mind. Recommended yearly ophthalmology/optometry visit for glaucoma screening and checkup Recommended yearly dental visit for hygiene and checkup  Vaccinations: Influenza vaccine: Up to date Pneumococcal vaccine: Up to date Tdap vaccine: Up to date- Due-08/08/2027 Shingles vaccine: Discuss with pharmacy   Covid-19:Booster due-Discuss with pharmacy  Advanced directives: Information given today  Conditions/risks identified: See problem list  Next appointment: Follow up in one year for your annual wellness visit    Preventive Care 65 Years and Older, Female Preventive care refers to lifestyle choices and visits with your health care provider that can promote health and wellness. What does preventive care include? A yearly physical exam. This is also called an annual well check. Dental exams once or twice a year. Routine eye exams. Ask your health care provider how often you should have your eyes checked. Personal lifestyle choices, including: Daily care of your teeth and gums. Regular physical activity. Eating a healthy diet. Avoiding tobacco and drug use. Limiting alcohol use. Practicing safe sex. Taking low-dose aspirin every day. Taking vitamin and mineral supplements as recommended by your health care provider. What happens during an annual well check? The services and screenings done by your health care provider during your annual well check will depend on your age, overall health, lifestyle risk factors, and family  history of disease. Counseling  Your health care provider may ask you questions about your: Alcohol use. Tobacco use. Drug use. Emotional well-being. Home and relationship well-being. Sexual activity. Eating habits. History of falls. Memory and ability to understand (cognition). Work and work Statistician. Reproductive health. Screening  You may have the following tests or measurements: Height, weight, and BMI. Blood pressure. Lipid and cholesterol levels. These may be checked every 5 years, or more frequently if you are over 16 years old. Skin check. Lung cancer screening. You may have this screening every year starting at age 61 if you have a 30-pack-year history of smoking and currently smoke or have quit within the past 15 years. Fecal occult blood test (FOBT) of the stool. You may have this test every year starting at age 64. Flexible sigmoidoscopy or colonoscopy. You may have a sigmoidoscopy every 5 years or a colonoscopy every 10 years starting at age 68. Hepatitis C blood test. Hepatitis B blood test. Sexually transmitted disease (STD) testing. Diabetes screening. This is done by checking your blood sugar (glucose) after you have not eaten for a while (fasting). You may have this done every 1-3 years. Bone density scan. This is done to screen for osteoporosis. You may have this done starting at age 38. Mammogram. This may be done every 1-2 years. Talk to your health care provider about how often you should have regular mammograms. Talk with your health care provider about your test results, treatment options, and if necessary, the need for more tests. Vaccines  Your health care provider may recommend certain vaccines, such as: Influenza vaccine. This is recommended every year. Tetanus, diphtheria, and acellular pertussis (Tdap, Td) vaccine. You may need a Td booster every 10 years. Zoster vaccine.  You may need this after age 52. Pneumococcal 13-valent conjugate (PCV13)  vaccine. One dose is recommended after age 61. Pneumococcal polysaccharide (PPSV23) vaccine. One dose is recommended after age 47. Talk to your health care provider about which screenings and vaccines you need and how often you need them. This information is not intended to replace advice given to you by your health care provider. Make sure you discuss any questions you have with your health care provider. Document Released: 05/22/2015 Document Revised: 01/13/2016 Document Reviewed: 02/24/2015 Elsevier Interactive Patient Education  2017 [employer] Prevention in the Home Falls can cause injuries. They can happen to people of all ages. There are many things you can do to make your home safe and to help prevent falls. What can I do on the outside of my home? Regularly fix the edges of walkways and driveways and fix any cracks. Remove anything that might make you trip as you walk through a door, such as a raised step or threshold. Trim any bushes or trees on the path to your home. Use bright outdoor lighting. Clear any walking paths of anything that might make someone trip, such as rocks or tools. Regularly check to see if handrails are loose or broken. Make sure that both sides of any steps have handrails. Any raised decks and porches should have guardrails on the edges. Have any leaves, snow, or ice cleared regularly. Use sand or salt on walking paths during winter. Clean up any spills in your garage right away. This includes oil or grease spills. What can I do in the bathroom? Use night lights. Install grab bars by the toilet and in the tub and shower. Do not use towel bars as grab bars. Use non-skid mats or decals in the tub or shower. If you need to sit down in the shower, use a plastic, non-slip stool. Keep the floor dry. Clean up any water that spills on the floor as soon as it happens. Remove soap buildup in the tub or shower regularly. Attach bath mats securely with  double-sided non-slip rug tape. Do not have throw rugs and other things on the floor that can make you trip. What can I do in the bedroom? Use night lights. Make sure that you have a light by your bed that is easy to reach. Do not use any sheets or blankets that are too big for your bed. They should not hang down onto the floor. Have a firm chair that has side arms. You can use this for support while you get dressed. Do not have throw rugs and other things on the floor that can make you trip. What can I do in the kitchen? Clean up any spills right away. Avoid walking on wet floors. Keep items that you use a lot in easy-to-reach places. If you need to reach something above you, use a strong step stool that has a grab bar. Keep electrical cords out of the way. Do not use floor polish or wax that makes floors slippery. If you must use wax, use non-skid floor wax. Do not have throw rugs and other things on the floor that can make you trip. What can I do with my stairs? Do not leave any items on the stairs. Make sure that there are handrails on both sides of the stairs and use them. Fix handrails that are broken or loose. Make sure that handrails are as long as the stairways. Check any carpeting to make sure that it is  firmly attached to the stairs. Fix any carpet that is loose or worn. Avoid having throw rugs at the top or bottom of the stairs. If you do have throw rugs, attach them to the floor with carpet tape. Make sure that you have a light switch at the top of the stairs and the bottom of the stairs. If you do not have them, ask someone to add them for you. What else can I do to help prevent falls? Wear shoes that: Do not have high heels. Have rubber bottoms. Are comfortable and fit you well. Are closed at the toe. Do not wear sandals. If you use a stepladder: Make sure that it is fully opened. Do not climb a closed stepladder. Make sure that both sides of the stepladder are locked  into place. Ask someone to hold it for you, if possible. Clearly mark and make sure that you can see: Any grab bars or handrails. First and last steps. Where the edge of each step is. Use tools that help you move around (mobility aids) if they are needed. These include: Canes. Walkers. Scooters. Crutches. Turn on the lights when you go into a dark area. Replace any light bulbs as soon as they burn out. Set up your furniture so you have a clear path. Avoid moving your furniture around. If any of your floors are uneven, fix them. If there are any pets around you, be aware of where they are. Review your medicines with your doctor. Some medicines can make you feel dizzy. This can increase your chance of falling. Ask your doctor what other things that you can do to help prevent falls. This information is not intended to replace advice given to you by your health care provider. Make sure you discuss any questions you have with your health care provider. Document Released: 02/19/2009 Document Revised: 10/01/2015 Document Reviewed: 05/30/2014 Elsevier Interactive Patient Education  2017 [employer].

## 2020-11-27 ENCOUNTER — Encounter: Payer: Self-pay | Admitting: Family Medicine

## 2020-11-27 ENCOUNTER — Ambulatory Visit (INDEPENDENT_AMBULATORY_CARE_PROVIDER_SITE_OTHER): Payer: Medicare HMO | Admitting: Family Medicine

## 2020-11-27 VITALS — BP 120/80 | HR 55 | Temp 97.6°F | Resp 20 | Ht 64.0 in | Wt 147.4 lb

## 2020-11-27 DIAGNOSIS — E785 Hyperlipidemia, unspecified: Secondary | ICD-10-CM

## 2020-11-27 DIAGNOSIS — I10 Essential (primary) hypertension: Secondary | ICD-10-CM

## 2020-11-27 DIAGNOSIS — E039 Hypothyroidism, unspecified: Secondary | ICD-10-CM | POA: Diagnosis not present

## 2020-11-27 DIAGNOSIS — J441 Chronic obstructive pulmonary disease with (acute) exacerbation: Secondary | ICD-10-CM | POA: Diagnosis not present

## 2020-11-27 DIAGNOSIS — R350 Frequency of micturition: Secondary | ICD-10-CM

## 2020-11-27 DIAGNOSIS — J410 Simple chronic bronchitis: Secondary | ICD-10-CM | POA: Diagnosis not present

## 2020-11-27 LAB — POC URINALSYSI DIPSTICK (AUTOMATED)
Bilirubin, UA: NEGATIVE
Blood, UA: NEGATIVE
Glucose, UA: NEGATIVE
Ketones, UA: NEGATIVE
Leukocytes, UA: NEGATIVE
Nitrite, UA: NEGATIVE
Protein, UA: NEGATIVE
Spec Grav, UA: 1.015 (ref 1.010–1.025)
Urobilinogen, UA: NEGATIVE E.U./dL — AB
pH, UA: 7.5 (ref 5.0–8.0)

## 2020-11-27 LAB — COMPREHENSIVE METABOLIC PANEL
ALT: 11 U/L (ref 0–35)
AST: 15 U/L (ref 0–37)
Albumin: 4.3 g/dL (ref 3.5–5.2)
Alkaline Phosphatase: 84 U/L (ref 39–117)
BUN: 13 mg/dL (ref 6–23)
CO2: 38 mEq/L — ABNORMAL HIGH (ref 19–32)
Calcium: 9.7 mg/dL (ref 8.4–10.5)
Chloride: 98 mEq/L (ref 96–112)
Creatinine, Ser: 0.73 mg/dL (ref 0.40–1.20)
GFR: 75.25 mL/min (ref >60.00)
Glucose, Bld: 97 mg/dL (ref 70–99)
Potassium: 4.4 mEq/L (ref 3.5–5.1)
Sodium: 142 mEq/L (ref 135–145)
Total Bilirubin: 0.6 mg/dL (ref 0.2–1.2)
Total Protein: 6.6 g/dL (ref 6.0–8.3)

## 2020-11-27 LAB — CBC WITH DIFFERENTIAL/PLATELET
Basophils Absolute: 0.1 10*3/uL (ref 0.0–0.1)
Basophils Relative: 1.1 % (ref 0.0–3.0)
Eosinophils Absolute: 0.1 10*3/uL (ref 0.0–0.7)
Eosinophils Relative: 1.4 % (ref 0.0–5.0)
HCT: 43.9 % (ref 36.0–46.0)
Hemoglobin: 14.4 g/dL (ref 12.0–15.0)
Lymphocytes Relative: 18.4 % (ref 12.0–46.0)
Lymphs Abs: 1.2 10*3/uL (ref 0.7–4.0)
MCHC: 32.7 g/dL (ref 30.0–36.0)
MCV: 91.5 fl (ref 78.0–100.0)
Monocytes Absolute: 0.6 10*3/uL (ref 0.1–1.0)
Monocytes Relative: 9.7 % (ref 3.0–12.0)
Neutro Abs: 4.4 10*3/uL (ref 1.4–7.7)
Neutrophils Relative %: 69.4 % (ref 43.0–77.0)
Platelets: 240 10*3/uL (ref 150.0–400.0)
RBC: 4.8 Mil/uL (ref 3.87–5.11)
RDW: 14.6 % (ref 11.5–15.5)
WBC: 6.3 10*3/uL (ref 4.0–10.5)

## 2020-11-27 LAB — LIPID PANEL
Cholesterol: 190 mg/dL (ref 0–200)
HDL: 64.5 mg/dL (ref >39.00)
LDL Cholesterol: 97 mg/dL (ref 0–99)
NonHDL: 125.14
Total CHOL/HDL Ratio: 3
Triglycerides: 142 mg/dL (ref 0.0–149.0)
VLDL: 28.4 mg/dL (ref 0.0–40.0)

## 2020-11-27 LAB — TSH: TSH: 3.14 u[IU]/mL (ref 0.35–5.50)

## 2020-11-27 MED ORDER — PREDNISONE 10 MG PO TABS: ORAL_TABLET | ORAL | 0 refills | Status: DC

## 2020-11-27 MED ORDER — ALBUTEROL SULFATE HFA 108 (90 BASE) MCG/ACT IN AERS: 2.0000 | INHALATION_SPRAY | RESPIRATORY_TRACT | 1 refills | Status: DC | PRN

## 2020-11-27 NOTE — Assessment & Plan Note (Signed)
Encourage heart healthy diet such as MIND or DASH diet, increase exercise, avoid trans fats, simple carbohydrates and processed foods, consider a krill or fish or flaxseed oil cap daily.  °

## 2020-11-27 NOTE — Assessment & Plan Note (Signed)
Well controlled, no changes to meds. Encouraged heart healthy diet such as the DASH diet and exercise as tolerated.  °

## 2020-11-27 NOTE — Assessment & Plan Note (Signed)
Check labs today stable

## 2020-11-27 NOTE — Assessment & Plan Note (Signed)
Check ua today

## 2020-11-27 NOTE — Progress Notes (Signed)
Patient ID: Carrie Hall, female    DOB: 10-15-1933  Age: 87 y.o. MRN: [id number]    Subjective:  Subjective  HPI Carrie Hall presents for an office visit and f/u on HLD and hypothyroidism today.  She complains of frequency. She notes that she may have an UTI.  Pt is unsure if she uses her 115-21 mcg/act ADVAIR HFA inhaler BID. She notes that she hasn't been using 108/ MCG/Act albuterol inhaler recently, since she had used it all. She notes that she uses her inhaler, almost every night. She reports that she used her inhaler prior going to the office. She notes that her breathing is "not bad". Pt SpO2 at the office is 91%. She notes taking 88 mcg Levothyroxine  PO QD. --- her PO was 97% yesterday  Lab Results  Component Value Date   TSH 1.34 05/22/2020  She endorses taking 10 mg Lipitor PO QD for her dx of HLD.   Lab Results  Component Value Date   CHOL 179 05/22/2020   CHOL 175 11/18/2019   CHOL 190 05/07/2019   Lab Results  Component Value Date   HDL 58.40 05/22/2020   HDL 59.80 11/18/2019   HDL 49.60 05/07/2019   Lab Results  Component Value Date   LDLCALC 105 (H) 05/22/2020   LDLCALC 93 11/18/2019   LDLCALC 115 (H) 05/07/2019   Lab Results  Component Value Date   TRIG 81.0 05/22/2020   TRIG 113.0 11/18/2019   TRIG 125.0 05/07/2019   Lab Results  Component Value Date   CHOLHDL 3 05/22/2020   CHOLHDL 3 11/18/2019   CHOLHDL 4 05/07/2019   Lab Results  Component Value Date   LDLDIRECT 103.3 03/07/2014   LDLDIRECT 134.2 04/26/2011   LDLDIRECT 92.3 12/11/2007  She notes that she feels cold in warm weather.  She denies any chest pain, SOB, fever, abdominal pain, cough, chills, sore throat, back pain, HA, or N/V/D at this time.   Review of Systems  Constitutional:  Negative for chills, fatigue and fever.  HENT:  Negative for ear pain, rhinorrhea, sinus pressure, sinus pain, sore throat and tinnitus.   Eyes:  Negative for pain.  Gastrointestinal:  Negative for  abdominal pain, anal bleeding, constipation, diarrhea, nausea and vomiting.  Genitourinary:  Positive for frequency.  Musculoskeletal:  Negative for back pain and neck pain.  Skin:  Negative for rash.  Neurological:  Negative for seizures, weakness, light-headedness, numbness and headaches.   History Past Medical History:  Diagnosis Date   Adenomatous colon polyp    Breast cancer ([place])    COPD (chronic obstructive pulmonary disease) ([place])    Diverticulosis    Hyperlipidemia    Internal hemorrhoids    Osteopenia    Thyroid disease     She has a past surgical history that includes colon polyps; Breast surgery; and Breast lumpectomy (Left).   Her family history includes Alzheimer's disease in her maternal grandmother and mother; Cancer in her father; Rectal cancer in her sister.She reports that she quit smoking about 16 years ago. Her smoking use included cigarettes. She smoked an average of .5 packs per day. She has never used smokeless tobacco. She reports that she does not drink alcohol and does not use drugs.  Current Outpatient Medications on File Prior to Visit  Medication Sig Dispense Refill   alendronate (FOSAMAX) 70 MG tablet Take 1 tablet (70 mg total) by mouth every 7 (seven) days. Take with a full glass of water on an empty stomach.  12 tablet 3   ALPRAZolam (XANAX) 0.25 MG tablet Take 1 tablet (0.25 mg total) by mouth at bedtime as needed for anxiety. 30 tablet 0   atorvastatin (LIPITOR) 10 MG tablet Take 1 tablet (10 mg total) by mouth at bedtime. 90 tablet 1   donepezil (ARICEPT ODT) 10 MG disintegrating tablet Take 1 tablet (10 mg total) by mouth at bedtime. 90 tablet 3   fluticasone-salmeterol (ADVAIR HFA) 115-21 MCG/ACT inhaler Inhale 2 puffs into the lungs 2 (two) times daily. 1 each 12   folic acid (FOLVITE) 1 MG tablet Take 1 mg by mouth daily.     levothyroxine (SYNTHROID) 88 MCG tablet TAKE ONE TABLET BY MOUTH BEFORE BREAKFAST 90 tablet 1   methotrexate 50 MG/2ML  injection SMARTSIG:0.6 Milliliter(s) IM Once a Week     Multiple Vitamins-Minerals (PRESERVISION AREDS 2 PO) Take 1 tablet by mouth 2 (two) times daily.     tiZANidine (ZANAFLEX) 4 MG tablet Take 1 tablet (4 mg total) by mouth every 6 (six) hours as needed for muscle spasms. 30 tablet 0   No current facility-administered medications on file prior to visit.     Objective:  Objective  Physical Exam Vitals and nursing note reviewed.  Constitutional:      General: She is not in acute distress.    Appearance: Normal appearance. She is well-developed. She is not ill-appearing.  HENT:     Head: Normocephalic and atraumatic.     Right Ear: External ear normal.     Left Ear: External ear normal.     Nose: Nose normal.  Eyes:     General:        Right eye: No discharge.        Left eye: No discharge.     Extraocular Movements: Extraocular movements intact.     Pupils: Pupils are equal, round, and reactive to light.  Cardiovascular:     Rate and Rhythm: Normal rate and regular rhythm.     Pulses: Normal pulses.     Heart sounds: Normal heart sounds. No murmur heard.   No friction rub. No gallop.  Pulmonary:     Effort: Pulmonary effort is normal. No respiratory distress.     Breath sounds: No stridor. Wheezing present. No rhonchi or rales.  Chest:     Chest wall: No tenderness.  Abdominal:     General: Bowel sounds are normal. There is no distension.     Palpations: Abdomen is soft. There is no mass.     Tenderness: There is no abdominal tenderness. There is no guarding or rebound.     Hernia: No hernia is present.  Musculoskeletal:        General: Normal range of motion.     Cervical back: Normal range of motion and neck supple.     Right lower leg: No edema.     Left lower leg: No edema.  Skin:    General: Skin is warm and dry.  Neurological:     Mental Status: She is alert and oriented to person, place, and time.  Psychiatric:        Behavior: Behavior normal.         Thought Content: Thought content normal.   BP 120/80 (BP Location: Left Arm, Patient Position: Sitting, Cuff Size: Normal)   Pulse (!) 55   Temp 97.6 F (36.4 C) (Oral)   Resp 20   Ht '5\' 4"'$  (1.626 m)   Wt 147 lb 6.4 oz (66.9 kg)  SpO2 91%   BMI 25.30 kg/m  Wt Readings from Last 3 Encounters:  11/27/20 147 lb 6.4 oz (66.9 kg)  11/26/20 147 lb 12.8 oz (67 kg)  08/24/20 143 lb (64.9 kg)     Lab Results  Component Value Date   WBC 6.1 05/28/2020   HGB 13.1 05/28/2020   HCT 38.8 05/28/2020   PLT 159 05/28/2020   GLUCOSE 87 08/24/2020   CHOL 179 05/22/2020   TRIG 81.0 05/22/2020   HDL 58.40 05/22/2020   LDLDIRECT 103.3 03/07/2014   LDLCALC 105 (H) 05/22/2020   ALT 17 05/22/2020   AST 22 05/22/2020   NA 142 08/24/2020   K 4.3 08/24/2020   CL 101 08/24/2020   CREATININE 0.64 08/24/2020   BUN 13 08/24/2020   CO2 35 (H) 08/24/2020   TSH 1.34 05/22/2020   HGBA1C 6.1 07/20/2012    US Venous Img Lower Unilateral Right  Result Date: 08/24/2020 CLINICAL DATA:  Right leg swelling, evaluate for DVT EXAM: RIGHT LOWER EXTREMITY VENOUS DOPPLER ULTRASOUND TECHNIQUE: Gray-scale sonography with compression, as well as color and duplex ultrasound, were performed to evaluate the deep venous system(s) from the level of the common femoral vein through the popliteal and proximal calf veins. COMPARISON:  None. FINDINGS: VENOUS Normal compressibility of the common femoral, superficial femoral, and popliteal veins, as well as the visualized calf veins. Visualized portions of profunda femoral vein and great saphenous vein unremarkable. No filling defects to suggest DVT on grayscale or color Doppler imaging. Doppler waveforms show normal direction of venous flow, normal respiratory plasticity and response to augmentation. Limited views of the contralateral common femoral vein are unremarkable. OTHER None. Limitations: none IMPRESSION: Negative. Electronically Signed   By: Julian Hy M.D.   On:  08/24/2020 18:52     Assessment & Plan:  Plan    Meds ordered this encounter  Medications   predniSONE (DELTASONE) 10 MG tablet    Sig: TAKE 3 TABLETS PO QD FOR 3 DAYS THEN TAKE 2 TABLETS PO QD FOR 3 DAYS THEN TAKE 1 TABLET PO QD FOR 3 DAYS THEN TAKE 1/2 TAB PO QD FOR 3 DAYS    Dispense:  20 tablet    Refill:  0   albuterol (VENTOLIN HFA) 108 (90 Base) MCG/ACT inhaler    Sig: Inhale 2 puffs into the lungs every 4 (four) hours as needed for wheezing or shortness of breath.    Dispense:  54 g    Refill:  1    Problem List Items Addressed This Visit       Unprioritized   COPD exacerbation ([place]) - Primary    pred taper Pt has not been using advair bid-- she will start        Relevant Medications   predniSONE (DELTASONE) 10 MG tablet   albuterol (VENTOLIN HFA) 108 (90 Base) MCG/ACT inhaler   Essential hypertension    Well controlled, no changes to meds. Encouraged heart healthy diet such as the DASH diet and exercise as tolerated.        Frequent urination    Check ua today       Relevant Orders   POCT Urinalysis Dipstick (Automated) (Completed)   Hyperlipidemia LDL goal <100    Encourage heart healthy diet such as MIND or DASH diet, increase exercise, avoid trans fats, simple carbohydrates and processed foods, consider a krill or fish or flaxseed oil cap daily.        Hypothyroidism    Check labs today stable  Relevant Orders   CBC with Differential/Platelet   TSH   Other Visit Diagnoses     Hyperlipidemia, unspecified hyperlipidemia type       Relevant Orders   CBC with Differential/Platelet   Lipid panel   Comprehensive metabolic panel   Simple chronic bronchitis (HCC)       Relevant Medications   albuterol (VENTOLIN HFA) 108 (90 Base) MCG/ACT inhaler       Follow-up: Return in about 2 weeks (around 12/11/2020), or if symptoms worsen or fail to improve, for copd.   I,Gordon Zheng,acting as a Education administrator for [employer], DO.,have  documented all relevant documentation on the behalf of [name], DO,as directed by  [name], DO while in the presence of [place], DO, have reviewed all documentation for this visit. The documentation on 11/27/20 for the exam, diagnosis, procedures, and orders are all accurate and complete.

## 2020-11-27 NOTE — Assessment & Plan Note (Signed)
pred taper Pt has not been using advair bid-- she will start

## 2020-11-27 NOTE — Patient Instructions (Signed)
Chronic Obstructive Pulmonary Disease °Chronic obstructive pulmonary disease (COPD) is a long-term (chronic) condition that affects the lungs. COPD is a general term that can be used to describe many different lung problems that cause lung inflammation and limit airflow, including chronic bronchitis and emphysema. °If you have COPD, your lung function will probably never return to normal. In most cases, it gets worse over time. However, there are steps you can take to slow the progression of the disease and improve your quality of life. °What are the causes? °This condition may be caused by: °Smoking. This is the most common cause. °Certain genes passed down through families. °What increases the risk? °The following factors may make you more likely to develop this condition: °Being exposed to secondhand smoke from cigarettes, pipes, or cigars. °Being exposed to chemicals and other irritants, such as fumes and dust in the work environment. °Having chronic lung conditions or infections. °What are the signs or symptoms? °Symptoms of this condition include: °Shortness of breath, especially during physical activity. °Chronic cough with a large amount of thick mucus. Sometimes, the cough may not have any mucus (dry cough). °Wheezing and rapid breathing. °Gray or bluish discoloration (cyanosis) of the skin, especially in the fingers, toes, or lips. °Feeling tired (fatigue). °Weight loss. °Chest tightness. °Frequent infections. °Episodes when breathing symptoms become much worse (exacerbations). °At the later stages of this disease, you may have swelling in the ankles, feet, or legs. °How is this diagnosed? °This condition is diagnosed based on: °Your medical history. °A physical exam. °You may also have tests, including: °Lung (pulmonary) function tests. This may include a spirometry test, which measures your ability to exhale properly. °Chest X-ray. °CT scan. °Blood tests. °How is this treated? °This condition may be  treated with: °Medicines. These may include inhaled rescue medicines to treat acute exacerbations as well as medicines that you take long-term (maintenance medicines) to prevent flare-ups of COPD. °Bronchodilators help treat COPD by dilating the airways to allow increased airflow and make your breathing more comfortable. °Steroids can reduce airway inflammation and help prevent exacerbations. °Smoking cessation. If you smoke, your health care provider may ask you to quit, and may also recommend therapy or replacement products to help you quit. °Pulmonary rehabilitation. This may involve working with a team of health care providers and specialists, such as respiratory, occupational, and physical therapists. °Exercise and physical activity. These are beneficial for nearly all people with COPD. °Nutrition therapy to gain weight, if you are underweight. °Oxygen. Supplemental oxygen therapy is only helpful if you have a low oxygen level in your blood (hypoxemia). °Lung surgery or transplant. °Palliative care. This is to help people with COPD feel comfortable when treatment is no longer working. °Follow these instructions at home: °Medicines °Take over-the-counter and prescription medicines only as told by your health care provider. This includes inhaled medicines and pills. °Talk to your health care provider before taking any cough or allergy medicines. You may need to avoid certain medicines that dry out your airways. °Lifestyle °If you smoke, the most important thing that you can do is to stop smoking. Continuing to smoke will cause the disease to progress faster. °Do not use any products that contain nicotine or tobacco. These products include cigarettes, chewing tobacco, and vaping devices, such as e-cigarettes. If you need help quitting, ask your health care provider. °Avoid exposure to things that irritate your lungs, such as smoke, chemicals, and fumes. °Stay active, but balance activity with periods of rest.  Exercise and physical   activity will help you maintain your ability to do things you want to do. °Learn and use relaxation techniques to manage stress and to control your breathing. °Get the right amount of sleep and get quality sleep. Most adults need 7 or more hours per night. °Eat healthy foods. Eating smaller, more frequent meals and resting before meals may help you maintain your strength. °Controlled breathing °Learn and use controlled breathing techniques as directed by your health care provider. Controlled breathing techniques include: °Pursed lip breathing. Start by breathing in (inhaling) through your nose for 1 second. Then, purse your lips as if you were going to whistle and breathe out (exhale) through the pursed lips for 2 seconds. °Diaphragmatic breathing. Start by putting one hand on your abdomen just above your waist. Inhale slowly through your nose. The hand on your abdomen should move out. Then purse your lips and exhale slowly. You should be able to feel the hand on your abdomen moving in as you exhale. ° °Controlled coughing °Learn and use controlled coughing to clear mucus from your lungs. Controlled coughing is a series of short, progressive coughs. The steps of controlled coughing are: °Lean your head slightly forward. °Breathe in deeply using diaphragmatic breathing. °Try to hold your breath for 3 seconds. °Keep your mouth slightly open while coughing twice. °Spit any mucus out into a tissue. °Rest and repeat the steps once or twice as needed. °General instructions °Make sure you receive all the vaccines that your health care provider recommends, especially the pneumococcal and influenza vaccines. Preventing infection and hospitalization is very important when you have COPD. °Drink enough fluid to keep your urine pale yellow, unless you have a medical condition that requires fluid restriction. °Use oxygen therapy and pulmonary rehabilitation if told by your health care provider. If you  require home oxygen therapy, ask your health care provider whether you should purchase a pulse oximeter to measure your oxygen level at home. °Work with your health care provider to develop a COPD action plan. This will help you know what steps to take if your condition gets worse. °Keep other chronic health conditions under control as told by your health care provider. °Avoid extreme temperature and humidity changes. °Avoid contact with people who have an illness that spreads from person to person (is contagious), such as viral infections or pneumonia. °Keep all follow-up visits. This is important. °Contact a health care provider if: °You are coughing up more mucus than usual. °There is a change in the color or thickness of your mucus. °Your breathing is more labored than usual. °Your breathing is faster than usual. °You have difficulty sleeping. °You need to use your rescue medicines or inhalers more often than expected. °You have trouble doing routine activities such as getting dressed or walking around the house. °Get help right away if: °You have shortness of breath while you are resting. °You have shortness of breath that prevents you from: °Being able to talk. °Performing your usual physical activities. °You have chest pain lasting longer than 5 minutes. °Your skin color is more blue (cyanotic) than usual. °You measure low oxygen saturations for longer than 5 minutes with a pulse oximeter. °You have a fever. °You feel too tired to breathe normally. °These symptoms may represent a serious problem that is an emergency. Do not wait to see if the symptoms will go away. Get medical help right away. Call your local emergency services (911 in the [place] Do not drive yourself to the hospital. °Summary °Chronic obstructive pulmonary   disease (COPD) is a long-term (chronic) condition that affects the lungs. °Your lung function will probably never return to normal. In most cases, it gets worse over time. However, there  are steps you can take to slow the progression of the disease and improve your quality of life. °Treatment for COPD may include taking medicines, quitting smoking, pulmonary rehabilitation, and changes to diet and exercise. As the disease progresses, you may need oxygen therapy, a lung transplant, or palliative care. °To help manage your condition, do not smoke, avoid exposure to things that irritate your lungs, stay up to date on all vaccines, and follow your health care provider's instructions for taking medicines. °This information is not intended to replace advice given to you by your health care provider. Make sure you discuss any questions you have with your health care provider. °Document Revised: 03/03/2020 Document Reviewed: 03/03/2020 °Elsevier Patient Education © 2022 [employer] ° °

## 2020-12-06 NOTE — Progress Notes (Signed)
Assessment/Plan:    Mild Dementia This is a very pleasant 87 year old right-handed woman history of mild dementia, currently on donepezil 10 mg daily, doing very well without any new complaints, and tolerating the medication well.  Last MMSE was 23/30 in October 2021.     Recommendations:  Discussed safety both in and out of the home.  Discussed the importance of regular daily schedule to maintain brain function.  Continue to monitor mood by PCP Stay active at least 30 minutes at least 3 times a week.  Naps should be scheduled and should be no longer than 60 minutes and should not occur after 2 PM.  Mediterranean diet is recommended  Continue donepezil 10 mg daily Side effects were discussed Follow up in 6 to 8 months   Case discussed with Dr. Delice Lesch who agrees with the plan     Subjective:   ED visits since last seen: UTI 08/24/20, Cough, 05/28/20 COPD exacerbation / [facility] admissions: none  Elisha LABREA WARSAW is a 87 y.o. RH female   with a history of hyperlipidemia, hypothyroidism, breast cancer, with mild dementia seen today in follow up for memory loss. This patient is accompanied in the office by her daughter in law Hilda Blades  who supplements the history.  Previous records as well as any outside records available were reviewed prior to todays visit.  She is currently on Donepezil 10 mg daily. Since her last visit, the patient feels her memory is "pretty good  although sometimes I have to write things down ".  Neoma Laming agrees with her.  The patient continues to live alone, and her son visits daily, and eats dinner with her.  She continues to manage her own medications, denying missing frequently any doses, and her son checks behind her.  Her son manages her finances.  She does not drive.  She continues to cook, denying leaving the stove on.  She is independent with dressing and bathing, and there are no hygiene concerns.  No paranoia or hallucinations have been reported.  Her sleep  is "not too good ", and she takes naps occasionally.  No wandering behavior.  She has occasional leg cramps, and has to get up to take mustard, which helps immediately.  She denies any headaches, dizziness, vision changes, focal numbness or tingling, weakness or falls.  No neck or back pain.  She has issues with urinary urgency and frequency, and is going to follow-up with her PCP this Friday.  Labs from PCP 11/27/20    TSH 3.14 nl  CMP, CBC normal  Lipid : LDL 105  mild elevation  UA neg  History on Initial Assessment 11/01/2017: This is an 87 year old right-handed woman with a history of hyperlipidemia, hypothyroidism, breast cancer, presenting for evaluation of worsening memory. When asked about her memory, she states she "forgets sometimes." Her daughter-in-law appears hesitant to provide additional information saying she does not want to hurt her feelings, but notes she repeats herself several times the same day. She lives alone, they report she cooks and cleans, no hygiene concerns. Her son has been managing bills for the past 5 years since the patient's husband passed away. She fills her own pillbox and states she forgets a dose once in a while. Her family does not monitor medications so they cannot confirm this. She notes word-finding difficulties. She does not drive. She denies misplacing things frequently or leaving the stove on. She seems more anxious and has prn Xanax, but does not take  this regularly. She took a dose last night and this morning nervous about today's visit. Her maternal grandmother, several cousins, and mother had Alzheimer's dementia. No history of significant head injuries or alcohol intake. She had an MMSE at PCP office in April 2017 which was 21/30 and was started on Donepezil but appears to have stopped it, she does not recall any side effects. Last MMSE at PCP office a few days ago was 25/30. She had an MRI brain without contrast in 08/2015 which I personally reviewed, no  acute changes, there was mild to moderate chronic microvascular disease and mild diffuse atrophy.   She denies any headaches, dizziness, vision changes, dysarthria/dysphagia, neck/back pain, focal numbness/tingling/weakness. She has constipation but would get diarrhea with spicy foods.   PREVIOUS MEDICATIONS:   CURRENT MEDICATIONS:  Outpatient Encounter Medications as of 12/07/2020  Medication Sig   albuterol (VENTOLIN HFA) 108 (90 Base) MCG/ACT inhaler Inhale 2 puffs into the lungs every 4 (four) hours as needed for wheezing or shortness of breath.   alendronate (FOSAMAX) 70 MG tablet Take 1 tablet (70 mg total) by mouth every 7 (seven) days. Take with a full glass of water on an empty stomach.   ALPRAZolam (XANAX) 0.25 MG tablet Take 1 tablet (0.25 mg total) by mouth at bedtime as needed for anxiety.   atorvastatin (LIPITOR) 10 MG tablet Take 1 tablet (10 mg total) by mouth at bedtime.   donepezil (ARICEPT ODT) 10 MG disintegrating tablet Take 1 tablet (10 mg total) by mouth at bedtime.   fluticasone-salmeterol (ADVAIR HFA) 115-21 MCG/ACT inhaler Inhale 2 puffs into the lungs 2 (two) times daily.   folic acid (FOLVITE) 1 MG tablet Take 1 mg by mouth daily.   levothyroxine (SYNTHROID) 88 MCG tablet TAKE ONE TABLET BY MOUTH BEFORE BREAKFAST   methotrexate 50 MG/2ML injection SMARTSIG:0.6 Milliliter(s) IM Once a Week   Multiple Vitamins-Minerals (PRESERVISION AREDS 2 PO) Take 1 tablet by mouth 2 (two) times daily.   predniSONE (DELTASONE) 10 MG tablet TAKE 3 TABLETS PO QD FOR 3 DAYS THEN TAKE 2 TABLETS PO QD FOR 3 DAYS THEN TAKE 1 TABLET PO QD FOR 3 DAYS THEN TAKE 1/2 TAB PO QD FOR 3 DAYS   tiZANidine (ZANAFLEX) 4 MG tablet Take 1 tablet (4 mg total) by mouth every 6 (six) hours as needed for muscle spasms.   No facility-administered encounter medications on file as of 12/07/2020.     Objective:     PHYSICAL EXAMINATION:    VITALS:   Vitals:   12/07/20 0900  BP: 137/76  Pulse: 72   Resp: 18  SpO2: 95%  Weight: 148 lb (67.1 kg)  Height: '5\' 4"'$  (1.626 m)    GEN:  The patient appears stated age and is in NAD. HEENT:  Normocephalic, atraumatic.   Neurological examination:  General: NAD, well-groomed, appears stated age. Orientation: The patient is alert. Oriented to person, place and not to date  Cranial nerves: There is good facial symmetry.The speech is fluent and clear. No aphasia or dysarthria. Fund of knowledge is appropriate. Recent and remote memory are intact. Attention and concentration are reduced  Able to name objects and repeat phrases.  Hearing is intact to conversational tone.    Sensation: Sensation is intact to light touch throughout Motor: Strength is at least antigravity x4. Tremors: none  DTR's 2/4 in UE/LE     Montreal Cognitive Assessment  01/31/2019 06/22/2018  Visuospatial/ Executive (0/5) - 3  Naming (0/3) - 3  Attention: Read  list of digits (0/2) 1 1  Attention: Read list of letters (0/1) 0 0  Attention: Serial 7 subtraction starting at 100 (0/3) 0 0  Language: Repeat phrase (0/2) 1 0  Language : Fluency (0/1) 0 0  Abstraction (0/2) 0 0  Delayed Recall (0/5) 3 0  Orientation (0/6) 4 6  Total - 13  Adjusted Score (based on education) - 14   MMSE - [place] Exam 03/05/2020 09/02/2019 11/01/2017  Orientation to time '4 3 5  '$ Orientation to Place '4 4 4  '$ Registration '3 3 3  '$ Attention/ Calculation 2 0 3  Recall '3 3 1  '$ Language- name 2 objects '2 2 2  '$ Language- repeat 0 1 1  Language- follow 3 step command '3 2 3  '$ Language- read & follow direction '1 1 1  '$ Write a sentence 0 0 1  Copy design '1 1 1  '$ Total score '23 20 25    '$ No flowsheet data found.    Movement examination: Tone: There is normal tone in the UE/LE Abnormal movements:  no tremor.  No myoclonus.  No asterixis.   Coordination:  There is no decremation with RAM's. Normal finger to nose  Gait and Station: The patient has no difficulty arising out of a deep-seated  chair without the use of the hands. The patient's stride length is good.  Gait is cautious and narrow.        Total time spent on today's visit was 30 minutes, including both face-to-face time and nonface-to-face time. Time included that spent on review of records (prior notes available to me/labs/imaging if pertinent), discussing treatment and goals, answering patient's questions and coordinating care.  Cc:  Carollee Herter, Alferd Apa, DO Sharene Butters, PA-C

## 2020-12-07 ENCOUNTER — Other Ambulatory Visit: Payer: Self-pay

## 2020-12-07 ENCOUNTER — Encounter: Payer: Self-pay | Admitting: Physician Assistant

## 2020-12-07 ENCOUNTER — Ambulatory Visit: Payer: Medicare HMO | Admitting: Physician Assistant

## 2020-12-07 VITALS — BP 137/76 | HR 72 | Resp 18 | Ht 64.0 in | Wt 148.0 lb

## 2020-12-07 DIAGNOSIS — F0390 Unspecified dementia without behavioral disturbance: Secondary | ICD-10-CM

## 2020-12-07 DIAGNOSIS — F03A0 Unspecified dementia, mild, without behavioral disturbance, psychotic disturbance, mood disturbance, and anxiety: Secondary | ICD-10-CM

## 2020-12-07 DIAGNOSIS — R69 Illness, unspecified: Secondary | ICD-10-CM | POA: Diagnosis not present

## 2020-12-07 NOTE — Patient Instructions (Signed)
Good to see you!  Continue Donepezil '10mg'$  daily.  Follow-up in 6-8 months, call for any changes.  FALL PRECAUTIONS: Be cautious when walking. Scan the area for obstacles that may increase the risk of trips and falls. When getting up in the mornings, sit up at the edge of the bed for a few minutes before getting out of bed. Consider elevating the bed at the head end to avoid drop of blood pressure when getting up. Walk always in a well-lit room (use night lights in the walls). Avoid area rugs or power cords from appliances in the middle of the walkways. Use a walker or a cane if necessary and consider physical therapy for balance exercise. Get your eyesight checked regularly.  HOME SAFETY: Consider the safety of the kitchen when operating appliances like stoves, microwave oven, and blender. Consider having supervision and share cooking responsibilities until no longer able to participate in those. Accidents with firearms and other hazards in the house should be identified and addressed as well.  ABILITY TO BE LEFT ALONE: If patient is unable to contact 911 operator, consider using LifeLine, or when the need is there, arrange for someone to stay with patients. Smoking is a fire hazard, consider supervision or cessation. Risk of wandering should be assessed by caregiver and if detected at any point, supervision and safe proof recommendations should be instituted.  MEDICATION SUPERVISION: Inability to self-administer medication needs to be constantly addressed. Implement a mechanism to ensure safe administration of the medications.  RECOMMENDATIONS FOR ALL PATIENTS WITH MEMORY PROBLEMS: 1. Continue to exercise (Recommend 30 minutes of walking everyday, or 3 hours every week) 2. Increase social interactions - continue going to [place] and enjoy social gatherings with friends and family 3. Eat healthy, avoid fried foods and eat more fruits and vegetables 4. Maintain adequate blood pressure, blood sugar, and  blood cholesterol level. Reducing the risk of stroke and cardiovascular disease also helps promoting better memory. 5. Avoid stressful situations. Live a simple life and avoid aggravations. Organize your time and prepare for the next day in anticipation. 6. Sleep well, avoid any interruptions of sleep and avoid any distractions in the bedroom that may interfere with adequate sleep quality 7. Avoid sugar, avoid sweets as there is a strong link between excessive sugar intake, diabetes, and cognitive impairment The Mediterranean diet has been shown to help patients reduce the risk of progressive memory disorders and reduces cardiovascular risk. This includes eating fish, eat fruits and green leafy vegetables, nuts like almonds and hazelnuts, walnuts, and also use olive oil. Avoid fast foods and fried foods as much as possible. Avoid sweets and sugar as sugar use has been linked to worsening of memory function.  There is always a concern of gradual progression of memory problems. If this is the case, then we may need to adjust level of care according to patient needs. Support, both to the patient and caregiver, should then be put into place.

## 2020-12-11 ENCOUNTER — Ambulatory Visit: Payer: Medicare HMO | Admitting: Family Medicine

## 2020-12-17 ENCOUNTER — Other Ambulatory Visit: Payer: Self-pay | Admitting: Family Medicine

## 2020-12-17 DIAGNOSIS — F411 Generalized anxiety disorder: Secondary | ICD-10-CM

## 2020-12-17 MED ORDER — ALPRAZOLAM 0.25 MG PO TABS: 0.2500 mg | ORAL_TABLET | Freq: Every evening | ORAL | 0 refills | Status: DC | PRN

## 2020-12-17 NOTE — Telephone Encounter (Signed)
Pt is needing her anxiety medicine sent to [facility] on [address] in [place] states she is completed out. Pt would like a call when rx is sent

## 2020-12-17 NOTE — Telephone Encounter (Signed)
Requesting: Xanax Contract: 2019 UDS: 2019 Last OV: 11/27/20 Next OV: N/A Last Refill: 03/10/20, #30--0 RF Database:   Please advise

## 2020-12-25 ENCOUNTER — Other Ambulatory Visit: Payer: Self-pay | Admitting: Family Medicine

## 2021-01-28 ENCOUNTER — Ambulatory Visit (INDEPENDENT_AMBULATORY_CARE_PROVIDER_SITE_OTHER): Payer: Medicare HMO | Admitting: Pharmacist

## 2021-01-28 DIAGNOSIS — E785 Hyperlipidemia, unspecified: Secondary | ICD-10-CM

## 2021-01-28 DIAGNOSIS — M8580 Other specified disorders of bone density and structure, unspecified site: Secondary | ICD-10-CM

## 2021-01-28 DIAGNOSIS — L400 Psoriasis vulgaris: Secondary | ICD-10-CM | POA: Diagnosis not present

## 2021-01-28 DIAGNOSIS — Z79899 Other long term (current) drug therapy: Secondary | ICD-10-CM | POA: Diagnosis not present

## 2021-01-28 DIAGNOSIS — G3184 Mild cognitive impairment, so stated: Secondary | ICD-10-CM

## 2021-01-28 DIAGNOSIS — J441 Chronic obstructive pulmonary disease with (acute) exacerbation: Secondary | ICD-10-CM

## 2021-01-28 NOTE — Patient Instructions (Signed)
Carrie Hall,  It was a pleasure speaking with you today.  I have attached a summary of our visit today and information about your health goals.  If you have any questions or concerns, please feel free to contact me either at the phone number below or with a MyChart message.   Remember to use your maintenance inhaler - Advair (purple) Inhaler every day - inhale 2 puffs twice a day (++remember to rinse / gargle after each use of Advair)  The albuterol / Ventolin inhaler is your rescue inhaler (black or dark blue inhaler) - inhaler 1 or 2 puff up to every 4 hours when you experience shortness of breath or wheezing.    Keep up the good work!  Carrie Hall, PharmD Clinical Pharmacist [place] [phone] (direct line)  [phone] (main office number)   Visit Information  PATIENT GOALS:  Goals Addressed             This Visit's Progress    Chronic Care Management Pharmacy Care Plan   On track    Carrie Hall (see longitudinal plan of care for additional care plan information)  Current Barriers:  Chronic Disease Management support, education, and care coordination needs related to COPD, HTN, HLD, Hypothyroidism, Anxiety, Osteoporosis, Memory Loss, Psorasis   Blood pressure monitoring BP Readings from Last 3 Encounters:  12/07/20 137/76  11/27/20 120/80  11/26/20 118/68  Pharmacist Clinical Goal(s): Over the next 180 days, patient will work with PharmD and providers to maintain BP goal <140/90 Current regimen:  Diet and exercise management   Patient self care activities - Over the next 180 days, patient will: Maintain blood pressure <140/90  Hyperlipidemia Lab Results  Component Value Date/Time   LDLCALC 97 11/27/2020 09:06 AM   LDLDIRECT 103.3 03/07/2014 11:23 AM  Pharmacist Clinical Goal(s): Over the next 180 days, patient will work with PharmD and providers to maintain LDL goal < 100 Current regimen:  Atorvastatin 10mg   daily Interventions: Recommended limiting intake of saturated and trans fat. Patient self care activities - Over the next 180 days, patient will: Maintain cholesterol medication regimen.  Limit intake of saturated and trans fat.   COPD Pharmacist Clinical Goal(s) Over the next 180 days, patient will work with PharmD and providers to reduce symptoms associated with COPD Current regimen:  Advair HFA 115-36mcg / actuation -  Inhale 2 puffs into lungs twice daily Ventolin / albuterol - inhaler 2 puffs up to every 4 hours as needed for  shortness of breath / wheezing Interventions: Discussed maintenance versus rescue inhaler.  Patient to start using Advair HFA twice a day. Reminded to rinse / gargle after each use of Advair inhaler Patient self care activities - Over the next 180 days, patient will: Advair (purple inhaler) is your maintenance inhaler - use every day; 2 puffs into lungs twice a day Remember to rinse mouth / gargle with water after each use of Advair Albuterol / Ventolin (black or dark blue inhaler) is your rescue inhaler  - use when needed for shortness of breath and wheezing.   Osteoporosis Pharmacist Clinical Goal(s) Over the next 180 days, patient will work with PharmD and providers to reduce risk of fracture due to osteoporosis  Current regimen:  Alendronate 70mg  weekly Interventions: Consider repeat DEXA (last was 10/06/2017) Patient self care activities - Over the next 180 days, patient will: Maintain osteoporosis medication regimen Complete DEXA Scan  Health Maintenance:  Discussed getting annual flu, Shingrix and COVID bivalent  vaccines.  Patient reports she received first Shingrix vaccine at community pharmacy and will be due 2nd dose Interventions:  Appointment made for patient to get flu vaccine 02/02/21 at [place] in our flu clinic Discussed [place] bivalent vaccine - patient considering and might get when she is here 9/27 at the [facility].    Medication management Goal: Achieve optimal medication adherence Patient reports she misses donepezil about once every 2 weeks. She falls asleep and forgets to take.  Current pharmacy: [facility] Comprehensive medication review performed. Reviewed adherence and refill history Utilize [facility] for medication synchronization and delivery Discussed ways to improved adherence for donepezil. Patient will try to take with evening meal instead of at bedtime.  Updated medication list - patient has been switched from methotrexate injection to oral pills.  Patient self care activities - Over the next 180 days, patient will: Focus on medication adherence by filling and taking medications appropriately  Take medications as prescribed Report any questions or concerns to PharmD and/or provider(s) Try taking donepezil with your evening meal instead of bedtime so you won't forget to take it.  Patient Goals/Self-Care Activities Over the next 90 days, patient will:  take medications as prescribed focus on medication adherence by using syncronized filling; also discussed taking donepezil with evening meal to increase adherence engage in dietary modifications by limiting trans and saturated fat intake  Please see past updates related to this goal by clicking on the "Past Updates" button in the selected goal           Patient verbalizes understanding of instructions provided today and agrees to view in [place].   Telephone follow up appointment with care management team member scheduled for: 3 months with clinical pharmacist The care management team will reach out to the patient again over the next 14 days.   Carrie Hall, PharmD Clinical Pharmacist [facility]

## 2021-01-28 NOTE — Chronic Care Management (AMB) (Signed)
Chronic Care Management Pharmacy Note  01/28/2021 Name:  Carrie Hall MRN:  [id number] DOB:  01-Feb-1934   Subjective: Carrie Hall is an 87 y.o. year old female who is a primary patient of Donato Schultz, DO.  The CCM team was consulted for assistance with disease management and care coordination needs.    Engaged with patient by telephone for follow up visit in response to provider referral for pharmacy case management and/or care coordination services.   Consent to Services:  The patient was given information about Chronic Care Management services, agreed to services, and gave verbal consent prior to initiation of services.  Please see initial visit note for detailed documentation.   Patient Care Team: Zola Button, Grayling Congress, DO as PCP - General Karel Jarvis Lesle Chris, MD as Consulting Physician (Neurology) Hollar, Ronal Fear, MD as Referring Physician (Dermatology) Henrene Pastor, PharmD (Pharmacist)   Objective:  Lab Results  Component Value Date   CREATININE 0.73 11/27/2020   CREATININE 0.64 08/24/2020   CREATININE 0.67 05/28/2020    Lab Results  Component Value Date   HGBA1C 6.1 07/20/2012   Last diabetic Eye exam: No results found for: HMDIABEYEEXA  Last diabetic Foot exam: No results found for: HMDIABFOOTEX      Component Value Date/Time   CHOL 190 11/27/2020 0906   TRIG 142.0 11/27/2020 0906   TRIG 89 02/27/2006 0859   HDL 64.50 11/27/2020 0906   CHOLHDL 3 11/27/2020 0906   VLDL 28.4 11/27/2020 0906   LDLCALC 97 11/27/2020 0906   LDLDIRECT 103.3 03/07/2014 1123    Hepatic Function Latest Ref Rng & Units 11/27/2020 05/22/2020 11/18/2019  Total Protein 6.0 - 8.3 g/dL 6.6 6.3 6.3  Albumin 3.5 - 5.2 g/dL 4.3 4.2 4.3  AST 0 - 37 U/L 15 22 14   ALT 0 - 35 U/L 11 17 11   Alk Phosphatase 39 - 117 U/L 84 71 68  Total Bilirubin 0.2 - 1.2 mg/dL 0.6 0.7 0.7  Bilirubin, Direct 0.0 - 0.3 mg/dL - - -    Lab Results  Component Value Date/Time   TSH 3.14  11/27/2020 09:06 AM   TSH 1.34 05/22/2020 09:26 AM   FREET4 1.1 12/11/2007 12:00 AM    CBC Latest Ref Rng & Units 11/27/2020 05/28/2020 05/22/2020  WBC 4.0 - 10.5 K/uL 6.3 6.1 4.8  Hemoglobin 12.0 - 15.0 g/dL 08.6 57.8 46.9  Hematocrit 36.0 - 46.0 % 43.9 38.8 40.0  Platelets 150.0 - 400.0 K/uL 240.0 159 200.0    Lab Results  Component Value Date/Time   VD25OH 50 04/22/2010 08:21 PM   VD25OH 34 06/11/2009 12:00 AM    Clinical ASCVD:  noted to have aortic athersclerosis The ASCVD Risk score (Arnett DK, et al., 2019) failed to calculate for the following reasons:   The 2019 ASCVD risk score is only valid for ages 36 to 66     Social History   Tobacco Use  Smoking Status Former   Packs/day: 0.50   Types: Cigarettes   Quit date: 12/06/2003   Years since quitting: 17.1  Smokeless Tobacco Never   BP Readings from Last 3 Encounters:  12/07/20 137/76  11/27/20 120/80  11/26/20 118/68   Pulse Readings from Last 3 Encounters:  12/07/20 72  11/27/20 (!) 55  11/26/20 60   Wt Readings from Last 3 Encounters:  12/07/20 148 lb (67.1 kg)  11/27/20 147 lb 6.4 oz (66.9 kg)  11/26/20 147 lb 12.8 oz (67 kg)  Assessment: Review of patient past medical history, allergies, medications, health status, including review of consultants reports, laboratory and other test data, was performed as part of comprehensive evaluation and provision of chronic care management services.   SDOH:  (Social Determinants of Health) assessments and interventions performed:    CCM Care Plan  Allergies  Allergen Reactions   Pneumococcal Vaccines     Medications Reviewed Today     Reviewed by Henrene Pastor, PharmD (Pharmacist) on 01/28/21 at 1128  Med List Status: <None>   Medication Order Taking? Sig Documenting Provider Last Dose Status Informant  albuterol (VENTOLIN HFA) 108 (90 Base) MCG/ACT inhaler [id number] Yes Inhale 2 puffs into the lungs every 4 (four) hours as needed for wheezing or  shortness of breath. Donato Schultz, DO Taking Active   alendronate (FOSAMAX) 70 MG tablet [id number] Yes Take 1 tablet (70 mg total) by mouth every 7 (seven) days. Take with a full glass of water on an empty stomach. Donato Schultz, DO Taking Active   ALPRAZolam Prudy Feeler) 0.25 MG tablet [id number] Yes Take 1 tablet (0.25 mg total) by mouth at bedtime as needed for anxiety. Zola Button, Myrene Buddy R, DO Taking Active   atorvastatin (LIPITOR) 10 MG tablet [id number] Yes TAKE ONE TABLET BY MOUTH EVERYDAY AT BEDTIME Donato Schultz, DO Taking Active   donepezil (ARICEPT ODT) 10 MG disintegrating tablet [id number] Yes Take 1 tablet (10 mg total) by mouth at bedtime. Donato Schultz, DO Taking Active            Med Note Clydie Braun, Edlyn Rosenburg B   Thu Jan 28, 2021 11:28 AM) Ok to take with evening meal so she does not miss dose at bedtime.   fluticasone-salmeterol (ADVAIR HFA) 115-21 MCG/ACT inhaler [id number] Yes Inhale 2 puffs into the lungs 2 (two) times daily.  Patient taking differently: Inhale 2 puffs into the lungs 2 (two) times daily. As needed   Donato Schultz, DO Taking Active   folic acid (FOLVITE) 1 MG tablet [id number] Yes Take 1 mg by mouth daily. [provider] Taking Active   levothyroxine (SYNTHROID) 88 MCG tablet [id number] Yes TAKE ONE TABLET BY MOUTH BEFORE BREAKFAST Donato Schultz, DO Taking Active   methotrexate (RHEUMATREX) 2.5 MG tablet [id number] Yes Take 12.5 mg by mouth once a week. On Wednesdays [provider] Taking Active   Multiple Vitamins-Minerals (PRESERVISION AREDS 2 PO) [id number] Yes Take 1 tablet by mouth 2 (two) times daily. [provider] Taking Active             Patient Active Problem List   Diagnosis Date Noted   Frequent urination 11/27/2020   COPD exacerbation (HCC) 11/27/2020   Aortic atherosclerosis (HCC) 08/12/2020   Oliguria 08/11/2020   Acute right-sided low back pain without sciatica 08/11/2020    Psoriasis 11/18/2019   Essential hypertension 02/12/2018   Generalized anxiety disorder 01/01/2018   Mild cognitive impairment 11/10/2017   Elevated LFTs 10/04/2017   Loss of weight 10/04/2017   Loose stools 10/04/2017   Weight loss, non-intentional 08/07/2017   Preventative health care 08/07/2017   Cerumen impaction 08/25/2015   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   Memory loss 08/25/2015   Diarrhea 12/19/2014   KNEE PAIN, LEFT 05/27/2010   DYSURIA 05/27/2010   POSTMENOPAUSAL STATUS 04/23/2010   CERVICAL POLYP 10/26/2009   HEMATURIA, HX OF 10/26/2009   DIZZINESS 06/11/2009   FOOT, PAIN 11/17/2008   OTHER ABNORMAL BLOOD CHEMISTRY 11/17/2008  TINEA CRURIS 09/15/2008   UTI 09/15/2008   COLONIC POLYPS, ADENOMATOUS, HX OF 01/16/2008   HEMOCCULT POSITIVE STOOL 12/11/2007   BREAST CANCER, HX OF 12/11/2007   Hypothyroidism 12/07/2007   Hyperlipidemia LDL goal <100 12/07/2007   COPD with chronic bronchitis (HCC) 12/07/2007   Disorder of bone and articular cartilage 12/07/2007   URI 03/13/2007   HX, URINARY INFECTION 01/09/2007    Immunization History  Administered Date(s) Administered   Fluad Quad(high Dose 65+) 03/11/2019, 03/23/2020   Influenza Split 03/15/2011   Influenza Whole 03/21/2007, 02/04/2008, 02/25/2009, 03/11/2010, 02/07/2012   Influenza, High Dose Seasonal PF 03/07/2014, 01/28/2016, 02/22/2017, 01/13/2018   Influenza,inj,Quad PF,6+ Mos 02/14/2013, 02/26/2015   PFIZER(Purple Top)SARS-COV-2 Vaccination 08/01/2019, 08/26/2019   Pneumococcal Conjugate-13 03/07/2014   Pneumococcal Polysaccharide-23 05/15/2006, 12/11/2007, 02/12/2018   Td 02/24/2004   Tdap 08/07/2017   Zoster, Live 11/02/2009    Conditions to be addressed/monitored: HLD, COPD, and aortic atherosclerosis,osteoporosis, mild cognitive impairment , psoriasis, hypothyroidism, ostoeporosis  Care Plan : General Pharmacy (Adult)  Updates made by Henrene Pastor, PHARMD since 01/28/2021 12:00 AM      Problem: pharmacy care plan for chronic condition monitoring and medication management   Priority: High     Long-Range Goal: Assist in management of chronic conditions (COPD, osteoporosis, heart disease, hypothyroidism and congnitive changes) and medications   Start Date: 07/28/2020  Priority: High  Note:   Current Barriers:  Unable to independently monitor therapeutic efficacy Does not adhere to prescribed medication regimen Education needed regarding use of maintenance and rescue inhalers  Pharmacist Clinical Goal(s):  Over the next 90 days, patient will achieve control of cholesterol / hyperlipidemia as evidenced by LDL <100 maintain control of COPD and hyperlipidemia  as evidenced by attaining goals listed below  adhere to plan to optimize therapeutic regimen for COPD as evidenced by report of adherence to recommended medication management changes through collaboration with PharmD and provider.   Interventions: 1:1 collaboration with Zola Button, Grayling Congress, DO regarding development and update of comprehensive plan of care as evidenced by provider attestation and co-signature Inter-disciplinary care team collaboration (see longitudinal plan of care) Comprehensive medication review performed; medication list updated in electronic medical record  Blood pressure monitoring At goal without need for pharmacotherapy;  BP goal <140/90 BP Readings from Last 3 Encounters:  12/07/20 137/76  11/27/20 120/80  11/26/20 118/68  Current regimen:  Diet and exercise management   Interventions: None today  Hyperlipidemia Lab Results  Component Value Date/Time   LDLCALC 97 11/27/2020 09:06 AM   LDLDIRECT 103.3 03/07/2014 11:23 AM  Controlled: LDL goal < 100 Current regimen:  Atorvastatin 10mg  daily Denies myalgias; history of increased LFTs but last LFT were WNL. Interventions: Recommend limiting intake of saturated and trans fat. Maintain cholesterol medication regimen.  Limit intake  of saturated and trans fat.   COPD Goal: reduce symptoms associated with COPD Current regimen:  Advair HFA 115-62mcg / actuation -  Inhale 2 puffs into lungs twice daily Ventolin / albuterol - inhaler 2 puffs up to every 4 hours as needed for  shortness of breath / wheezing Rescue inhaler use - about once every 2 weeks Maintenance Inhaler use - per patient only uses as needed Interventions: Discussed maintenance versus rescue inhaler.  Patient to start using Advair HFA twice a day. Reminded to rinse / gargle after each use of Advair inhaler   Osteoporosis Goal: reduce risk of fracture due to osteoporosis  Current regimen:  Alendronate 70mg  weekly Interventions: Consider repeat DEXA (last was  10/06/2017) Maintain osteoporosis medication regimen  Health Maintenance:  Discussed getting annual flu, Shingrix and COVID bivalent vaccines.  Patient reports she received first Shingrix vaccine at community pharmacy and will be due 2nd dose Interventions:  Appointment made for patient to get flu vaccine 02/02/21 at 9am in our flu clinic Discussed COVID bivalent vaccine - patient considering and might get when she is here 9/27 at the [facility] pharmacy.   Medication management Goal: Achieve optimal medication adherence Patient reports she misses donepezil about once every 2 weeks. She falls asleep and forgets to take.  Current pharmacy: [facility] Comprehensive medication review performed. Reviewed adherence and refill history Utilize [facility] pharmacy for medication synchronization and delivery Discussed ways to improved adherence for donepezil. Patient will try to take with evening meal instead of at bedtime.  Updated medication list - patient has been switched from methotrexate injection to oral pills.    Patient Goals/Self-Care Activities Over the next 90 days, patient will:  take medications as prescribed focus on medication adherence by using syncronized filling;  also discussed taking donepezil with evening meal to increase adherence engage in dietary modifications by limiting trans and saturated fat intake.   Follow Up Plan: Telephone follow up appointment with care management team member scheduled for:  3 months - clinical pharmacist and The care management team will reach out to the patient again over the next 14 days. To review meds and assist with refills as needed          Medication Assistance: None required.  Patient affirms current coverage meets needs.  Patient's preferred pharmacy is:  [facility] - [place], [place] - [address] [place] [address] Phone: [phone] Fax: [phone]  [facility] [address] [place], [place] - [address] [place] [address] Phone: [phone] Fax: [phone]   Pt endorses 95% compliance  Follow Up:  Patient agrees to Care Plan and Follow-up.  Plan: Telephone follow up appointment with care management team member scheduled for:  3 months and The care management team will reach out to the patient again over the next 14 days.  Henrene Pastor, PharmD Clinical Pharmacist [facility]

## 2021-02-01 NOTE — Progress Notes (Signed)
Carrie Hall is a 87 y.o. female presents to the office today for High Dose Flue shot, per physician's orders.  Fluquad High Dose 0.5 mL IM was administered L Deltoid today. Patient tolerated injection.  Given VIS and Consent form.   Creft, Darlis Loan

## 2021-02-02 ENCOUNTER — Other Ambulatory Visit: Payer: Self-pay

## 2021-02-02 ENCOUNTER — Ambulatory Visit (INDEPENDENT_AMBULATORY_CARE_PROVIDER_SITE_OTHER): Payer: Medicare HMO

## 2021-02-02 ENCOUNTER — Encounter: Payer: Self-pay | Admitting: Family Medicine

## 2021-02-02 DIAGNOSIS — Z23 Encounter for immunization: Secondary | ICD-10-CM | POA: Diagnosis not present

## 2021-02-05 DIAGNOSIS — E785 Hyperlipidemia, unspecified: Secondary | ICD-10-CM

## 2021-02-05 DIAGNOSIS — J441 Chronic obstructive pulmonary disease with (acute) exacerbation: Secondary | ICD-10-CM

## 2021-02-18 ENCOUNTER — Telehealth: Payer: Self-pay | Admitting: Pharmacist

## 2021-02-18 NOTE — Chronic Care Management (AMB) (Signed)
    Chronic Care Management Pharmacy Assistant   Name: Carrie Hall  MRN: [id number] DOB: [birth year]  Reason for Encounter: Disease State General  Recent office visits:  None noted  Recent consult visits:  None noted  Hospital visits:  Medication Reconciliation was completed by comparing discharge summary, patient's EMR and Pharmacy list, and upon discussion with patient.  Admitted to the hospital on 08/24/20 due to Leg pain. Discharge date was 08/24/20. Discharged from [place]?Medications Started at [facility] Discharge:?? -started Notes not available.  Medication Changes at Hospital Discharge: -Changed Notes not available.  Medications Discontinued at Hospital Discharge: -Stopped  Notes not available.  Medications that remain the same after Hospital Discharge:??  -All other medications will remain the same.    Medications: Outpatient Encounter Medications as of 02/18/2021  Medication Sig Note   albuterol (VENTOLIN HFA) 108 (90 Base) MCG/ACT inhaler Inhale 2 puffs into the lungs every 4 (four) hours as needed for wheezing or shortness of breath.    alendronate (FOSAMAX) 70 MG tablet Take 1 tablet (70 mg total) by mouth every 7 (seven) days. Take with a full glass of water on an empty stomach.    ALPRAZolam (XANAX) 0.25 MG tablet Take 1 tablet (0.25 mg total) by mouth at bedtime as needed for anxiety.    atorvastatin (LIPITOR) 10 MG tablet TAKE ONE TABLET BY MOUTH EVERYDAY AT BEDTIME    donepezil (ARICEPT ODT) 10 MG disintegrating tablet Take 1 tablet (10 mg total) by mouth at bedtime. 01/28/2021: Ok to take with evening meal so she does not miss dose at bedtime.    fluticasone-salmeterol (ADVAIR HFA) 115-21 MCG/ACT inhaler Inhale 2 puffs into the lungs 2 (two) times daily. (Patient taking differently: Inhale 2 puffs into the lungs 2 (two) times daily. As needed)    folic acid (FOLVITE) 1 MG tablet Take 1 mg by mouth daily.    levothyroxine  (SYNTHROID) 88 MCG tablet TAKE ONE TABLET BY MOUTH BEFORE BREAKFAST    methotrexate (RHEUMATREX) 2.5 MG tablet Take 12.5 mg by mouth once a week. On Wednesdays    Multiple Vitamins-Minerals (PRESERVISION AREDS 2 PO) Take 1 tablet by mouth 2 (two) times daily.    No facility-administered encounter medications on file as of 02/18/2021.   Have you had any problems recently with your health? Patient states she has been stressed due to her not having a phone for a month and her nerves have been bothering her. Patient states she does take her Xanax as needed and request a lower dose.  Have you had any problems with your pharmacy? Patient states she has not had any problems with her pharmacy she states her pharmacy does very well with delivering her medications on time.  What issues or side effects are you having with your medications? Patient states she has not had any side effects with her medications.  What would you like me to pass along to [place] for them to help you with?  Patient states she is good on her medications she has another weeks worth of medication.  What can we do to take care of you better? Patient states there is nothing at this time.  Star Rating Drugs: Atorvastatin 10 mg Last filled:01/11/21 90 DS  Myriam Elta [place], [place].

## 2021-03-02 ENCOUNTER — Ambulatory Visit: Payer: Medicare HMO | Admitting: Physician Assistant

## 2021-03-02 ENCOUNTER — Telehealth: Payer: Self-pay | Admitting: Family Medicine

## 2021-03-02 NOTE — Telephone Encounter (Signed)
Pt called and said she spoke with Nira Conn about anxiety medication, she seems to have questions. Please advise

## 2021-03-03 NOTE — Telephone Encounter (Signed)
Attempted to call pt's cell. Cell did not have VM and the home phone continued to ring. VM did not pick up

## 2021-03-08 ENCOUNTER — Encounter (HOSPITAL_BASED_OUTPATIENT_CLINIC_OR_DEPARTMENT_OTHER): Payer: Self-pay

## 2021-03-08 ENCOUNTER — Other Ambulatory Visit: Payer: Self-pay

## 2021-03-08 ENCOUNTER — Emergency Department (HOSPITAL_BASED_OUTPATIENT_CLINIC_OR_DEPARTMENT_OTHER): Payer: Medicare HMO

## 2021-03-08 ENCOUNTER — Telehealth: Payer: Self-pay | Admitting: Family Medicine

## 2021-03-08 ENCOUNTER — Emergency Department (HOSPITAL_BASED_OUTPATIENT_CLINIC_OR_DEPARTMENT_OTHER)
Admission: EM | Admit: 2021-03-08 | Discharge: 2021-03-08 | Disposition: A | Discharge status: Home / Self Care | Payer: Medicare HMO | Attending: Student | Admitting: Student

## 2021-03-08 DIAGNOSIS — Z87891 Personal history of nicotine dependence: Secondary | ICD-10-CM | POA: Diagnosis not present

## 2021-03-08 DIAGNOSIS — R0602 Shortness of breath: Secondary | ICD-10-CM | POA: Diagnosis not present

## 2021-03-08 DIAGNOSIS — Z20822 Contact with and (suspected) exposure to covid-19: Secondary | ICD-10-CM | POA: Insufficient documentation

## 2021-03-08 DIAGNOSIS — E039 Hypothyroidism, unspecified: Secondary | ICD-10-CM | POA: Diagnosis not present

## 2021-03-08 DIAGNOSIS — I10 Essential (primary) hypertension: Secondary | ICD-10-CM | POA: Diagnosis not present

## 2021-03-08 DIAGNOSIS — Z79899 Other long term (current) drug therapy: Secondary | ICD-10-CM | POA: Diagnosis not present

## 2021-03-08 DIAGNOSIS — J449 Chronic obstructive pulmonary disease, unspecified: Secondary | ICD-10-CM | POA: Diagnosis not present

## 2021-03-08 DIAGNOSIS — Z853 Personal history of malignant neoplasm of breast: Secondary | ICD-10-CM | POA: Diagnosis not present

## 2021-03-08 DIAGNOSIS — J441 Chronic obstructive pulmonary disease with (acute) exacerbation: Secondary | ICD-10-CM | POA: Diagnosis not present

## 2021-03-08 LAB — CBC
HCT: 40.3 % (ref 36.0–46.0)
Hemoglobin: 13.9 g/dL (ref 12.0–15.0)
MCH: 32.6 pg (ref 26.0–34.0)
MCHC: 34.5 g/dL (ref 30.0–36.0)
MCV: 94.6 fL (ref 80.0–100.0)
Platelets: 258 10*3/uL (ref 150–400)
RBC: 4.26 MIL/uL (ref 3.87–5.11)
RDW: 13.9 % (ref 11.5–15.5)
WBC: 9.2 10*3/uL (ref 4.0–10.5)
nRBC: 0 % (ref 0.0–0.2)

## 2021-03-08 LAB — BASIC METABOLIC PANEL
Anion gap: 9 (ref 5–15)
BUN: 14 mg/dL (ref 8–23)
CO2: 30 mmol/L (ref 22–32)
Calcium: 9.1 mg/dL (ref 8.9–10.3)
Chloride: 99 mmol/L (ref 98–111)
Creatinine, Ser: 0.67 mg/dL (ref 0.44–1.00)
GFR, Estimated: >60 mL/min (ref >60)
Glucose, Bld: 110 mg/dL — ABNORMAL HIGH (ref 70–99)
Potassium: 4.2 mmol/L (ref 3.5–5.1)
Sodium: 138 mmol/L (ref 135–145)

## 2021-03-08 LAB — TROPONIN I (HIGH SENSITIVITY)
Troponin I (High Sensitivity): 8 ng/L (ref <18)
Troponin I (High Sensitivity): 9 ng/L (ref <18)

## 2021-03-08 LAB — RESP PANEL BY RT-PCR (FLU A&B, COVID) ARPGX2
Influenza A by PCR: NEGATIVE
Influenza B by PCR: NEGATIVE
SARS Coronavirus 2 by RT PCR: NEGATIVE

## 2021-03-08 MED ORDER — PREDNISONE 50 MG PO TABS
60.0000 mg | ORAL_TABLET | Freq: Once | ORAL | Status: AC
  Administered 2021-03-08: 60 mg via ORAL
  Filled 2021-03-08: qty 1

## 2021-03-08 MED ORDER — PREDNISONE 10 MG PO TABS: 40.0000 mg | ORAL_TABLET | Freq: Every day | ORAL | 0 refills | Status: AC

## 2021-03-08 MED ORDER — METHYLPREDNISOLONE SODIUM SUCC 125 MG IJ SOLR: 125.0000 mg | Freq: Once | INTRAMUSCULAR | Status: DC

## 2021-03-08 MED ORDER — IPRATROPIUM-ALBUTEROL 0.5-2.5 (3) MG/3ML IN SOLN
3.0000 mL | Freq: Once | RESPIRATORY_TRACT | Status: AC
  Administered 2021-03-08: 3 mL via RESPIRATORY_TRACT
  Filled 2021-03-08: qty 3

## 2021-03-08 NOTE — Telephone Encounter (Signed)
Attempted to contact patient for appointment and to discuss symptoms, voicemail not set up. Will continue to attempt contact with patient.   [facility] - Client TELEPHONE ADVICE RECORDAccessNurse Patient Name:Carrie Hall Return Phone Number:[phone](Primary),[phone](Secondary) Client [facility] - Client Client Site [facility] - Day Physician Roma Schanz- MD Contact Type Call Who Is Calling Patient / Member / Family / Caregiver Call Type Triage / Clinical Relationship To Patient Self Return Phone Number [phone] (Primary) Chief Complaint Allergic reaction (unknown symptoms) Reason for Call Symptomatic / Request for Health Information Initial Comment Caller states she is having an allergic reaction to the her albuterol inhaler. Caller states it is hard for her to talk and it feels like something is in her throat. Caller states she is not having any trouble breathing. Translation No Nurse Assessment Nurse: Humfleet, RN, Estill Bamberg Date/Time (Eastern Time): 03/08/2021 12:08:55 PM Confirm and document reason for call. If symptomatic, describe symptoms. ---caller states she used her albuterol inhaler last night and she thinks she might be having a reaction to it. says her voice is hoarse. she also has advair inhaler and no issue with it. has not seen doctor since august. albuterol was prescribed in august. started using the inhaler last night due to coughing up phlegm. No shortness of breath. said she felt like she could not cough anything up. also has had swelling in legs and feet for several weeks. has not seen doctor for it. audible inhalation wheezing. denies shortness of breath Does the patient have any new or worsening symptoms? ---Yes Will a triage be completed? ---Yes Related visit to physician within the last 2 weeks? ---No Does the PT have any chronic conditions? (i.e. diabetes, asthma, this  includes High risk factors for pregnancy, etc.) ---Yes List chronic conditions. ---copd on advair Is this a behavioral health or substance abuse call? ---No Call Id: [id number] Humfleet, RN, Estill Bamberg 03/08/2021 12:14:06 PM Disp. Time Eilene Ghazi Time) Disposition Final User 03/08/2021 12:18:58 PM See PCP within 24 Hours Yes Humfleet, RN, Shelly Coss Disagree/Comply Comply Caller Understands Yes PreDisposition Call Doctor Care Advice Given Per Guideline SEE PCP WITHIN 24 HOURS: * IF OFFICE WILL BE OPEN: You need to be examined within the next 24 hours. Call your doctor (or NP/PA) when the office opens and make an appointment. CARE ADVICE given per Leg Swelling and Edema (Adult) guideline. * You become worse * Breathing difficulty or chest pain occurs CALL BACK IF: Comments User: Rozelle Logan, RN Date/Time Eilene Ghazi Time): 03/08/2021 12:19:25 PM continues to deny shortness of breath Referrals REFERRED TO PCP OFFICE

## 2021-03-08 NOTE — ED Triage Notes (Signed)
Pt arrives with c/o wheezing, states that she has been on inhalers for 10 years for COPD. Pt reports last night she used her albuterol because she felt like something was stuck in her throat, states she woke up later and felt hoarse. States wheezing since last night.

## 2021-03-08 NOTE — ED Notes (Signed)
ED Provider at bedside. 

## 2021-03-08 NOTE — ED Provider Notes (Signed)
[facility] EMERGENCY DEPARTMENT Provider Note   CSN: [id number] Arrival date & time: 03/08/21  1319     History Chief Complaint  Patient presents with   Shortness of Breath    Carrie Hall is a 87 y.o. female with PMH anxiety, COPD, HLD who presents the emergency department for evaluation of shortness of breath and a hoarse voice.  Patient states that last night she had worsening of her shortness of breath and she used a single puff of her albuterol inhaler and is worried that she was allergic to it because she awoke this morning with a hoarse voice.  She arrives complaining of shortness of breath but denies chest pain, abdominal pain, nausea, vomiting, fevers or other systemic symptoms.  She arrives saturating 94% on room air.   Shortness of Breath Associated symptoms: wheezing   Associated symptoms: no abdominal pain, no chest pain, no cough, no ear pain, no fever, no rash, no sore throat and no vomiting       Past Medical History:  Diagnosis Date   Adenomatous colon polyp    Breast cancer ([place])    COPD (chronic obstructive pulmonary disease) ([place])    Diverticulosis    Hyperlipidemia    Internal hemorrhoids    Osteopenia    Thyroid disease     Patient Active Problem List   Diagnosis Date Noted   Frequent urination 11/27/2020   COPD exacerbation ([place]) 11/27/2020   Aortic atherosclerosis ([place]) 08/12/2020   Oliguria 08/11/2020   Acute right-sided low back pain without sciatica 08/11/2020   Psoriasis 11/18/2019   Essential hypertension 02/12/2018   Generalized anxiety disorder 01/01/2018   Mild cognitive impairment 11/10/2017   Elevated LFTs 10/04/2017   Loss of weight 10/04/2017   Loose stools 10/04/2017   Weight loss, non-intentional 08/07/2017   Preventative health care 08/07/2017   Cerumen impaction 08/25/2015   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   Memory loss 08/25/2015   Diarrhea 12/19/2014   KNEE PAIN, LEFT 05/27/2010   DYSURIA 05/27/2010    POSTMENOPAUSAL STATUS 04/23/2010   CERVICAL POLYP 10/26/2009   HEMATURIA, HX OF 10/26/2009   DIZZINESS 06/11/2009   FOOT, PAIN 11/17/2008   OTHER ABNORMAL BLOOD CHEMISTRY 11/17/2008   TINEA CRURIS 09/15/2008   UTI 09/15/2008   COLONIC POLYPS, ADENOMATOUS, HX OF 01/16/2008   HEMOCCULT POSITIVE STOOL 12/11/2007   BREAST CANCER, HX OF 12/11/2007   Hypothyroidism 12/07/2007   Hyperlipidemia LDL goal <100 12/07/2007   COPD with chronic bronchitis ([place]) 12/07/2007   Disorder of bone and articular cartilage 12/07/2007   URI 03/13/2007   HX, URINARY INFECTION 01/09/2007    Past Surgical History:  Procedure Laterality Date   BREAST LUMPECTOMY Left    radiation only   BREAST SURGERY     colon polyps       OB History   No obstetric history on file.     Family History  Problem Relation Age of Onset   Alzheimer's disease Mother    Cancer Father        lung   Alzheimer's disease Maternal Grandmother    Rectal cancer Sister        in her 40s    Social History   Tobacco Use   Smoking status: Former    Packs/day: 0.50    Types: Cigarettes    Quit date: 12/06/2003    Years since quitting: 17.2   Smokeless tobacco: Never  Vaping Use   Vaping Use: Never used  Substance Use  Topics   Alcohol use: No   Drug use: No    Home Medications Prior to Admission medications   Medication Sig Start Date End Date Taking? Authorizing Provider  albuterol (VENTOLIN HFA) 108 (90 Base) MCG/ACT inhaler Inhale 2 puffs into the lungs every 4 (four) hours as needed for wheezing or shortness of breath. 11/27/20   Carollee Herter, Alferd Apa, DO  alendronate (FOSAMAX) 70 MG tablet Take 1 tablet (70 mg total) by mouth every 7 (seven) days. Take with a full glass of water on an empty stomach. 09/24/20   [name], DO  ALPRAZolam (XANAX) 0.25 MG tablet Take 1 tablet (0.25 mg total) by mouth at bedtime as needed for anxiety. 12/17/20   [name], DO  atorvastatin (LIPITOR) 10 MG tablet  TAKE ONE TABLET BY MOUTH EVERYDAY AT BEDTIME 12/25/20   Carollee Herter, Yvonne R, DO  donepezil (ARICEPT ODT) 10 MG disintegrating tablet Take 1 tablet (10 mg total) by mouth at bedtime. 03/23/20   [name], DO  fluticasone-salmeterol (ADVAIR HFA) 970-26 MCG/ACT inhaler Inhale 2 puffs into the lungs 2 (two) times daily. Patient taking differently: Inhale 2 puffs into the lungs 2 (two) times daily. As needed 10/07/20   Carollee Herter, Alferd Apa, DO  folic acid (FOLVITE) 1 MG tablet Take 1 mg by mouth daily.    [provider]  levothyroxine (SYNTHROID) 88 MCG tablet TAKE ONE TABLET BY MOUTH BEFORE BREAKFAST 10/01/20   Carollee Herter, Alferd Apa, DO  methotrexate (RHEUMATREX) 2.5 MG tablet Take 12.5 mg by mouth once a week. On Wednesdays 11/09/20   [provider]  Multiple Vitamins-Minerals (PRESERVISION AREDS 2 PO) Take 1 tablet by mouth 2 (two) times daily.    [provider]    Allergies    Pneumococcal vaccines  Review of Systems   Review of Systems  Constitutional:  Negative for chills and fever.  HENT:  Positive for voice change. Negative for ear pain and sore throat.   Eyes:  Negative for pain and visual disturbance.  Respiratory:  Positive for shortness of breath and wheezing. Negative for cough.   Cardiovascular:  Negative for chest pain and palpitations.  Gastrointestinal:  Negative for abdominal pain and vomiting.  Genitourinary:  Negative for dysuria and hematuria.  Musculoskeletal:  Negative for arthralgias and back pain.  Skin:  Negative for color change and rash.  Neurological:  Negative for seizures and syncope.  All other systems reviewed and are negative.  Physical Exam Updated Vital Signs BP (!) 154/85   Pulse 74   Temp 98.5 F (36.9 C) (Oral)   Resp 18   Ht 5\' 4"  (1.626 m)   Wt 66.7 kg   SpO2 94%   BMI 25.23 kg/m   Physical Exam Vitals and nursing note reviewed.  Constitutional:      General: She is not in acute distress.     Appearance: She is well-developed.  HENT:     Head: Normocephalic and atraumatic.  Eyes:     Conjunctiva/sclera: Conjunctivae normal.  Cardiovascular:     Rate and Rhythm: Normal rate and regular rhythm.     Heart sounds: No murmur heard. Pulmonary:     Effort: Pulmonary effort is normal. No respiratory distress.     Breath sounds: Wheezing present.  Abdominal:     Palpations: Abdomen is soft.     Tenderness: There is no abdominal tenderness.  Musculoskeletal:     Cervical back: Neck supple.  Skin:  General: Skin is warm and dry.  Neurological:     Mental Status: She is alert.    ED Results / Procedures / Treatments   Labs (all labs ordered are listed, but only abnormal results are displayed) Labs Reviewed  BASIC METABOLIC PANEL - Abnormal; Notable for the following components:      Result Value   Glucose, Bld 110 (*)    All other components within normal limits  RESP PANEL BY RT-PCR (FLU A&B, COVID) ARPGX2  CBC  TROPONIN I (HIGH SENSITIVITY)  TROPONIN I (HIGH SENSITIVITY)    EKG None  Radiology DG Chest 2 View  Result Date: 03/08/2021 CLINICAL DATA:  Shortness of breath. EXAM: CHEST - 2 VIEW COMPARISON:  May 28, 2020. FINDINGS: The heart size and mediastinal contours are within normal limits. Both lungs are clear. The visualized skeletal structures are unremarkable. IMPRESSION: No active cardiopulmonary disease. Electronically Signed   By: Marijo Conception M.D.   On: 03/08/2021 14:18    Procedures Procedures   Medications Ordered in ED Medications  ipratropium-albuterol (DUONEB) 0.5-2.5 (3) MG/3ML nebulizer solution 3 mL (has no administration in time range)  predniSONE (DELTASONE) tablet 60 mg (has no administration in time range)    ED Course  I have reviewed the triage vital signs and the nursing notes.  Pertinent labs & imaging results that were available during my care of the patient were reviewed by me and considered in my medical decision making  (see chart for details).    MDM Rules/Calculators/A&P                           Patient seen emergency department for evaluation of shortness of breath.  Physical exam reveals bilateral and expiratory wheezing but is otherwise unremarkable.  Patient received 60 mg of prednisone and a single DuoNeb leading to a dramatic improvement in her symptoms.  On reevaluation, patient states that her tightness in her chest, shortness of breath and voice change have improved the patient is currently safer discharge.  She has no wheezing on reevaluation.  She was then discharged with a continued 4-day course of 40 mg prednisone and instructions to use her Advair daily and her albuterol rescue inhaler as needed.  Patient then discharged. Final Clinical Impression(s) / ED Diagnoses Final diagnoses:  None    Rx / DC Orders ED Discharge Orders     None        Ashonti Leandro, MD 03/08/21 1650

## 2021-03-08 NOTE — Telephone Encounter (Signed)
Pt stated they were having a reaction to their new inhaler and it was effecting their throat. They were sent to triage.

## 2021-03-10 ENCOUNTER — Other Ambulatory Visit: Payer: Self-pay

## 2021-03-10 ENCOUNTER — Encounter (HOSPITAL_BASED_OUTPATIENT_CLINIC_OR_DEPARTMENT_OTHER): Payer: Self-pay

## 2021-03-10 ENCOUNTER — Emergency Department (HOSPITAL_BASED_OUTPATIENT_CLINIC_OR_DEPARTMENT_OTHER)
Admission: EM | Admit: 2021-03-10 | Discharge: 2021-03-10 | Disposition: A | Discharge status: Home / Self Care | Payer: Medicare HMO | Attending: Emergency Medicine | Admitting: Emergency Medicine

## 2021-03-10 ENCOUNTER — Emergency Department (HOSPITAL_BASED_OUTPATIENT_CLINIC_OR_DEPARTMENT_OTHER): Payer: Medicare HMO

## 2021-03-10 DIAGNOSIS — I10 Essential (primary) hypertension: Secondary | ICD-10-CM | POA: Insufficient documentation

## 2021-03-10 DIAGNOSIS — W19XXXA Unspecified fall, initial encounter: Secondary | ICD-10-CM

## 2021-03-10 DIAGNOSIS — R197 Diarrhea, unspecified: Secondary | ICD-10-CM | POA: Insufficient documentation

## 2021-03-10 DIAGNOSIS — J449 Chronic obstructive pulmonary disease, unspecified: Secondary | ICD-10-CM | POA: Insufficient documentation

## 2021-03-10 DIAGNOSIS — Z853 Personal history of malignant neoplasm of breast: Secondary | ICD-10-CM | POA: Insufficient documentation

## 2021-03-10 DIAGNOSIS — R059 Cough, unspecified: Secondary | ICD-10-CM | POA: Insufficient documentation

## 2021-03-10 DIAGNOSIS — E039 Hypothyroidism, unspecified: Secondary | ICD-10-CM | POA: Insufficient documentation

## 2021-03-10 DIAGNOSIS — Z79899 Other long term (current) drug therapy: Secondary | ICD-10-CM | POA: Diagnosis not present

## 2021-03-10 DIAGNOSIS — M545 Low back pain, unspecified: Secondary | ICD-10-CM | POA: Diagnosis not present

## 2021-03-10 DIAGNOSIS — I878 Other specified disorders of veins: Secondary | ICD-10-CM | POA: Diagnosis not present

## 2021-03-10 DIAGNOSIS — Z87891 Personal history of nicotine dependence: Secondary | ICD-10-CM | POA: Diagnosis not present

## 2021-03-10 DIAGNOSIS — M791 Myalgia, unspecified site: Secondary | ICD-10-CM | POA: Diagnosis not present

## 2021-03-10 DIAGNOSIS — M25552 Pain in left hip: Secondary | ICD-10-CM | POA: Diagnosis not present

## 2021-03-10 DIAGNOSIS — M7918 Myalgia, other site: Secondary | ICD-10-CM

## 2021-03-10 MED ORDER — ALBUTEROL SULFATE HFA 108 (90 BASE) MCG/ACT IN AERS
2.0000 | INHALATION_SPRAY | Freq: Once | RESPIRATORY_TRACT | Status: AC
  Administered 2021-03-10: 2 via RESPIRATORY_TRACT
  Filled 2021-03-10: qty 6.7

## 2021-03-10 NOTE — Discharge Instructions (Signed)
Your history, exam, work-up today are consistent with likely soft tissue muscle skeletal pains in your left low back and left hip area from the fall.  The x-rays did not show any bony injury.  Your x-ray also confirmed no evidence of pneumonia and although you did have 1 episode of transient hypoxia, during observation for over 4 hours he did not have any further breathing troubles or hypoxia.  We discussed the possibly of admission however he would rather go home.  Please follow-up with your primary doctor and if any symptoms change or worsen, please return to the nearest emergency department.  Please continue your home medications and follow-up with PCP.

## 2021-03-10 NOTE — ED Notes (Signed)
Pt ambulatory with stand by assist to restroom.

## 2021-03-10 NOTE — ED Notes (Signed)
Pt discharged to home. Discharge instructions have been discussed with patient and/or family members. Pt verbally acknowledges understanding d/c instructions, and endorses comprehension to checkout at registration before leaving.  °

## 2021-03-10 NOTE — ED Notes (Signed)
ED Provider at bedside. 

## 2021-03-10 NOTE — ED Provider Notes (Signed)
[facility] EMERGENCY DEPARTMENT Provider Note   CSN: [id number] Arrival date & time: 03/10/21  0746     History Chief Complaint  Patient presents with   Fall   Diarrhea    Carrie Hall is a 87 y.o. female.  The history is provided by the patient and medical records. No language interpreter was used.  Fall This is a new problem. The current episode started 3 to 5 hours ago. The problem occurs rarely. The problem has not changed since onset.Pertinent negatives include no chest pain, no abdominal pain, no headaches and no shortness of breath. Nothing aggravates the symptoms. Nothing relieves the symptoms. She has tried nothing for the symptoms. The treatment provided no relief.  Diarrhea Quality:  Watery Severity:  Moderate Onset quality:  Gradual Timing:  Constant Progression:  Unchanged Relieved by:  Nothing Worsened by:  Nothing Ineffective treatments:  None tried Associated symptoms: cough   Associated symptoms: no abdominal pain, no chills, no diaphoresis, no fever, no headaches, no URI and no vomiting       Past Medical History:  Diagnosis Date   Adenomatous colon polyp    Breast cancer ([place])    COPD (chronic obstructive pulmonary disease) ([place])    Diverticulosis    Hyperlipidemia    Internal hemorrhoids    Osteopenia    Thyroid disease     Patient Active Problem List   Diagnosis Date Noted   Frequent urination 11/27/2020   COPD exacerbation ([place]) 11/27/2020   Aortic atherosclerosis ([place]) 08/12/2020   Oliguria 08/11/2020   Acute right-sided low back pain without sciatica 08/11/2020   Psoriasis 11/18/2019   Essential hypertension 02/12/2018   Generalized anxiety disorder 01/01/2018   Mild cognitive impairment 11/10/2017   Elevated LFTs 10/04/2017   Loss of weight 10/04/2017   Loose stools 10/04/2017   Weight loss, non-intentional 08/07/2017   Preventative health care 08/07/2017   Cerumen impaction 08/25/2015   Hearing loss of both ears  08/25/2015   Memory loss 08/25/2015   Diarrhea 12/19/2014   KNEE PAIN, LEFT 05/27/2010   DYSURIA 05/27/2010   POSTMENOPAUSAL STATUS 04/23/2010   CERVICAL POLYP 10/26/2009   HEMATURIA, HX OF 10/26/2009   DIZZINESS 06/11/2009   FOOT, PAIN 11/17/2008   OTHER ABNORMAL BLOOD CHEMISTRY 11/17/2008   TINEA CRURIS 09/15/2008   UTI 09/15/2008   COLONIC POLYPS, ADENOMATOUS, HX OF 01/16/2008   HEMOCCULT POSITIVE STOOL 12/11/2007   BREAST CANCER, HX OF 12/11/2007   Hypothyroidism 12/07/2007   Hyperlipidemia LDL goal <100 12/07/2007   COPD with chronic bronchitis ([place]) 12/07/2007   Disorder of bone and articular cartilage 12/07/2007   URI 03/13/2007   HX, URINARY INFECTION 01/09/2007    Past Surgical History:  Procedure Laterality Date   BREAST LUMPECTOMY Left    radiation only   BREAST SURGERY     colon polyps       OB History   No obstetric history on file.     Family History  Problem Relation Age of Onset   Alzheimer's disease Mother    Cancer Father        lung   Alzheimer's disease Maternal Grandmother    Rectal cancer Sister        in her 68s    Social History   Tobacco Use   Smoking status: Former    Packs/day: 0.50    Types: Cigarettes    Quit date: 12/06/2003    Years since quitting: 17.2   Smokeless tobacco: Never  Vaping Use  Vaping Use: Never used  Substance Use Topics   Alcohol use: No   Drug use: No    Home Medications Prior to Admission medications   Medication Sig Start Date End Date Taking? Authorizing Provider  albuterol (VENTOLIN HFA) 108 (90 Base) MCG/ACT inhaler Inhale 2 puffs into the lungs every 4 (four) hours as needed for wheezing or shortness of breath. 11/27/20   Carollee Herter, Alferd Apa, DO  alendronate (FOSAMAX) 70 MG tablet Take 1 tablet (70 mg total) by mouth every 7 (seven) days. Take with a full glass of water on an empty stomach. 09/24/20   [name], DO  ALPRAZolam (XANAX) 0.25 MG tablet Take 1 tablet (0.25 mg total) by  mouth at bedtime as needed for anxiety. 12/17/20   [name], DO  atorvastatin (LIPITOR) 10 MG tablet TAKE ONE TABLET BY MOUTH EVERYDAY AT BEDTIME 12/25/20   Carollee Herter, Yvonne R, DO  donepezil (ARICEPT ODT) 10 MG disintegrating tablet Take 1 tablet (10 mg total) by mouth at bedtime. 03/23/20   [name], DO  fluticasone-salmeterol (ADVAIR HFA) 536-14 MCG/ACT inhaler Inhale 2 puffs into the lungs 2 (two) times daily. Patient taking differently: Inhale 2 puffs into the lungs 2 (two) times daily. As needed 10/07/20   Carollee Herter, Alferd Apa, DO  folic acid (FOLVITE) 1 MG tablet Take 1 mg by mouth daily.    [provider]  levothyroxine (SYNTHROID) 88 MCG tablet TAKE ONE TABLET BY MOUTH BEFORE BREAKFAST 10/01/20   Carollee Herter, Alferd Apa, DO  methotrexate (RHEUMATREX) 2.5 MG tablet Take 12.5 mg by mouth once a week. On Wednesdays 11/09/20   [provider]  Multiple Vitamins-Minerals (PRESERVISION AREDS 2 PO) Take 1 tablet by mouth 2 (two) times daily.    [provider]  predniSONE (DELTASONE) 10 MG tablet Take 4 tablets (40 mg total) by mouth daily for 4 days. 03/08/21 03/12/21  Kommor, [place], MD    Allergies    Pneumococcal vaccines  Review of Systems   Review of Systems  Constitutional:  Negative for chills, diaphoresis, fatigue and fever.  HENT:  Negative for congestion.   Eyes:  Negative for visual disturbance.  Respiratory:  Positive for cough. Negative for chest tightness, shortness of breath and wheezing.   Cardiovascular:  Negative for chest pain, palpitations and leg swelling.  Gastrointestinal:  Positive for diarrhea. Negative for abdominal pain, constipation, nausea and vomiting.  Genitourinary:  Negative for dysuria and flank pain.  Musculoskeletal:  Positive for back pain (left low back). Negative for neck pain and neck stiffness.  Skin:  Negative for rash and wound.  Neurological:  Negative for dizziness, light-headedness, numbness and  headaches.  Psychiatric/Behavioral:  Negative for agitation.    Physical Exam Updated Vital Signs BP 132/72   Pulse 65   Temp 98.9 F (37.2 C)   Resp 18   Ht 5\' 4"  (1.626 m)   Wt 66.7 kg   SpO2 96%   BMI 25.23 kg/m   Physical Exam Vitals and nursing note reviewed.  Constitutional:      General: She is not in acute distress.    Appearance: She is well-developed. She is not ill-appearing, toxic-appearing or diaphoretic.  HENT:     Head: Normocephalic and atraumatic.     Nose: Nose normal. No congestion or rhinorrhea.     Mouth/Throat:     Mouth: Mucous membranes are moist.  Eyes:     Conjunctiva/sclera: Conjunctivae normal.  Cardiovascular:  Rate and Rhythm: Normal rate and regular rhythm.     Heart sounds: No murmur heard. Pulmonary:     Effort: Pulmonary effort is normal. No respiratory distress.     Breath sounds: Normal breath sounds. No wheezing, rhonchi or rales.  Chest:     Chest wall: No tenderness.  Abdominal:     General: Abdomen is flat.     Palpations: Abdomen is soft.     Tenderness: There is no abdominal tenderness. There is no right CVA tenderness, left CVA tenderness, guarding or rebound.  Musculoskeletal:        General: Tenderness and signs of injury present.     Cervical back: Neck supple. No tenderness.     Right hip: Tenderness present. No deformity, lacerations or bony tenderness.     Right lower leg: No edema.     Left lower leg: No edema.       Legs:  Skin:    General: Skin is warm and dry.     Capillary Refill: Capillary refill takes less than 2 seconds.     Findings: No erythema.  Neurological:     General: No focal deficit present.     Mental Status: She is alert. Mental status is at baseline.     Sensory: No sensory deficit.     Motor: No weakness.  Psychiatric:        Mood and Affect: Mood normal.    ED Results / Procedures / Treatments   Labs (all labs ordered are listed, but only abnormal results are displayed) Labs  Reviewed - No data to display  EKG None  Radiology DG Chest 2 View  Result Date: 03/10/2021 CLINICAL DATA:  Fall.  Recent COPD.  Exacerbation. EXAM: CHEST - 2 VIEW COMPARISON:  03/08/2021 FINDINGS: Heart size is normal. Aortic atherosclerotic calcifications. No pleural effusion, airspace consolidation or pneumothorax identified. No acute osseous findings identified. IMPRESSION: No acute cardiopulmonary abnormalities. Electronically Signed   By: Kerby Moors M.D.   On: 03/10/2021 10:38   DG Chest 2 View  Result Date: 03/08/2021 CLINICAL DATA:  Shortness of breath. EXAM: CHEST - 2 VIEW COMPARISON:  May 28, 2020. FINDINGS: The heart size and mediastinal contours are within normal limits. Both lungs are clear. The visualized skeletal structures are unremarkable. IMPRESSION: No active cardiopulmonary disease. Electronically Signed   By: Marijo Conception M.D.   On: 03/08/2021 14:18   DG Lumbar Spine Complete  Result Date: 03/10/2021 CLINICAL DATA:  Back pain EXAM: LUMBAR SPINE - COMPLETE 4+ VIEW COMPARISON:  Lumbar spine x-ray 08/11/2020 FINDINGS: Lumbar vertebral body height and alignment are preserved without fracture or spondylolisthesis. No spondylolysis visualized. Bones are osteopenic. Mild intervertebral disc space narrowing throughout the lumbar spine. Facet arthropathy most significant at L4-L5 and L5-S1. IMPRESSION: Chronic changes with no acute osseous abnormality identified. Electronically Signed   By: Ofilia Neas M.D.   On: 03/10/2021 10:36   DG Hip Unilat W or Wo Pelvis 2-3 Views Left  Result Date: 03/10/2021 CLINICAL DATA:  87 year old female status post fall with pain. EXAM: DG HIP (WITH OR WITHOUT PELVIS) 2-3V LEFT COMPARISON:  CT Abdomen and Pelvis 10/23/2017. FINDINGS: Femoral heads are normally located. Mild asymmetric left hip joint space loss. Pelvis appears intact. Grossly intact proximal right femur. Intact proximal left femur. No acute osseous abnormality  identified. Pelvic phleboliths. Negative visible bowel gas. IMPRESSION: No acute fracture or dislocation identified about the left hip or pelvis. Electronically Signed   By: Herminio Heads.D.  On: 03/10/2021 10:37    Procedures Procedures   Medications Ordered in ED Medications  albuterol (VENTOLIN HFA) 108 (90 Base) MCG/ACT inhaler 2 puff (2 puffs Inhalation Given 03/10/21 1008)    ED Course  I have reviewed the triage vital signs and the nursing notes.  Pertinent labs & imaging results that were available during my care of the patient were reviewed by me and considered in my medical decision making (see chart for details).    MDM Rules/Calculators/A&P                           Debbora DALEEN STEINHAUS is a 87 y.o. female with a past medical history significant for thyroid disease, hyperlipidemia, diverticulosis, osteopenia, previous breast cancer, and COPD currently on steroids for COPD exacerbation who presents for follow-up.  Patient reports that she is on day 3 of steroids for COPD exacerbation where she was seen the other day.  She reports that she has not had more chest pain or shortness of breath but still has some productive cough with phlegm.  She says that she started having diarrhea overnight and while going to the bathroom, she slipped on some urine and she thinks she accidentally urinated on the ground having a fall into the bathtub.  She reports primarily pain in her left buttock and left low back but denies significant pain in her legs.  She did not hit her head and did not lose conscious.  Denies any upper back pain, chest pain, or abdominal pain.  She is still able to ambulate without difficulty.  She reports the pain is moderate but due to the pain wants to get evaluated.  On exam, lungs do have wheezing and some rhonchi.  Chest and abdomen were nontender.  Upper back was nontender.  Midline was nontender in the entire back but left low paraspinal back was slightly tender.  Patient also had  some tenderness over her left buttock and gluteus area and towards the left hip.  She was had normal strength and sensation in both legs and had good pulses throughout.  Abdomen otherwise nontender.  Patient's vital signs are reassuring while at rest in the room.  Of note, nursing reports that when she ambulate to the bathroom, she did not have difficulty walking with assistance but she did have hypoxia with her oxygen saturation going to 84%.  She also looks somewhat short of breath.  After getting back to the exam room, her oxygen saturations are now back in the 90s.  She does not take oxygen at home.  Given the wheezing, will give some albuterol and due to the productive cough persistence, we will get a chest x-ray.  We will get x-ray of the lumbar spine and the left hip/pelvis area.  We will hold on blood work initially.  I suspect that symptoms are still there is residual COPD exacerbation however if she continues to have intermittent hypoxia or has evidence of pneumonia, will have a shared decision made conversation and may end up needing to do more work-up today.  Have low suspicion for bony injury at this time.  Anticipate reassessment.   12:09 PM Patient was able to ambulate to the bathroom and back a second time without any hypoxia or symptoms.  X-rays returned showing no pneumonia, hip fracture, or spine fracture.  Patient is feeling better and thinks is just a bruise.  She now wants to go home.  We discussed that given the  transient hypoxia today given her COPD exacerbation, we could theoretically discuss admission but she does not want to do this.  Patient would rather follow-up with her PCP but still understands return precautions for new or worsened symptoms.  She reports that she needs to go home to "do a lot of things".  Given her well appearance, normal oxygen saturations at rest, and normal ambulation without evidence of bony injury, we feel she is safe for discharge home at this  time.  Patient with questions or concerns and was discharged in good condition.  Final Clinical Impression(s) / ED Diagnoses Final diagnoses:  Fall, initial encounter  Musculoskeletal pain    Rx / DC Orders ED Discharge Orders     None       Clinical Impression: 1. Fall, initial encounter   2. Musculoskeletal pain     Disposition: Discharge  Condition: Good  I have discussed the results, Dx and Tx plan with the pt(& family if present). He/she/they expressed understanding and agree(s) with the plan. Discharge instructions discussed at great length. Strict return precautions discussed and pt &/or family have verbalized understanding of the instructions. No further questions at time of discharge.    New Prescriptions   No medications on file    Follow Up: [name], DO [address] [place] [address] [phone]     [facility] [place] EMERGENCY DEPARTMENT [address] [id number] [place]       Dariann Huckaba, Gwenyth Allegra, MD 03/10/21 1213

## 2021-03-10 NOTE — ED Notes (Signed)
Pharmacy updated with patient 

## 2021-03-10 NOTE — ED Notes (Addendum)
Pt o2 sats dropped when ambulating from restroom to 84%, recovered to 100% after returning to room. PT endorses WNL for desat to occur when ambulating. RT Antony Madura and EDP T[place] notified. O2 sats being monitored

## 2021-03-10 NOTE — ED Triage Notes (Signed)
Pt reports falling in bathroom after having episode of diarrhea. Pt c/o L buttock and lower back pain. Pt denies head injury/LOC. No blood thinners per pt.

## 2021-03-10 NOTE — ED Notes (Signed)
Pt sitting in chair, warm blankets provided. Pt inquiring as to how much longer, EDP notified

## 2021-03-19 ENCOUNTER — Other Ambulatory Visit: Payer: Self-pay

## 2021-03-19 ENCOUNTER — Encounter: Payer: Self-pay | Admitting: Family Medicine

## 2021-03-19 ENCOUNTER — Ambulatory Visit (INDEPENDENT_AMBULATORY_CARE_PROVIDER_SITE_OTHER): Payer: Medicare HMO | Admitting: Family Medicine

## 2021-03-19 VITALS — BP 124/74 | HR 71 | Temp 98.5°F | Resp 20 | Ht 64.0 in | Wt 155.0 lb

## 2021-03-19 DIAGNOSIS — W19XXXA Unspecified fall, initial encounter: Secondary | ICD-10-CM | POA: Diagnosis not present

## 2021-03-19 DIAGNOSIS — J209 Acute bronchitis, unspecified: Secondary | ICD-10-CM

## 2021-03-19 DIAGNOSIS — J440 Chronic obstructive pulmonary disease with acute lower respiratory infection: Secondary | ICD-10-CM

## 2021-03-19 DIAGNOSIS — J441 Chronic obstructive pulmonary disease with (acute) exacerbation: Secondary | ICD-10-CM | POA: Diagnosis not present

## 2021-03-19 MED ORDER — AZITHROMYCIN 250 MG PO TABS: ORAL_TABLET | ORAL | 0 refills | Status: DC

## 2021-03-19 NOTE — Assessment & Plan Note (Signed)
z pak  con't cough med otc and inhalers  rto prn

## 2021-03-19 NOTE — Assessment & Plan Note (Signed)
Pt feels like she could not get to the bathroom quick enough and some urine was on the floor and she slipped and fell  Bruising is healing Er records reviewed

## 2021-03-19 NOTE — Patient Instructions (Signed)

## 2021-03-19 NOTE — Progress Notes (Addendum)
Subjective:   By signing my name below, I, Carrie Hall, attest that this documentation has been prepared under the direction and in the presence of  Carrie Hall R DO. 03/19/2021    Patient ID: Carrie Hall, female    DOB: 09-22-1933, 87 y.o.   MRN: [id number]  Chief Complaint  Patient presents with   ER follow up    Fall, Pt states falling in the bathroom.Pt reports no new falls and reports only brusing.     HPI Patient is in today for an office visit and ER follow-up.  Patient was admitted into the ER on 03/08/2021 c/o shortness of breath and having a hoarse voice. She was also admitted to the ER on 03/10/2021 for having a fall and diarrhea.  Today, she admits she is doing well.   She has had no falls since her visit to the ER. She still has bruises on her buttocks that are tender but they are healing.   She reports that the diarrhea is almost resolved and and is using Imodium to manage the symptoms. It has reduced to about once a week.   She denies shortness of breath, wheezing, chest tightness and cough. She is using an inhaler to manage her breathing. Her voice is still hoarse. She mentions that she gets occasional coughs but uses robitussin to manage it.  She has receive the flu vaccine and the 1st dose of the shingles vaccine.  Past Medical History:  Diagnosis Date   Adenomatous colon polyp    Breast cancer ([place])    COPD (chronic obstructive pulmonary disease) ([place])    Diverticulosis    Hyperlipidemia    Internal hemorrhoids    Osteopenia    Thyroid disease     Past Surgical History:  Procedure Laterality Date   BREAST LUMPECTOMY Left    radiation only   BREAST SURGERY     colon polyps      Family History  Problem Relation Age of Onset   Alzheimer's disease Mother    Cancer Father        lung   Alzheimer's disease Maternal Grandmother    Rectal cancer Sister        in her 53s    Social History   Socioeconomic History   Marital status:  Widowed    Spouse name: Not on file   Number of children: Not on file   Years of education: Not on file   Highest education level: Not on file  Occupational History   Not on file  Tobacco Use   Smoking status: Former    Packs/day: 0.50    Types: Cigarettes    Quit date: 12/06/2003    Years since quitting: 17.2   Smokeless tobacco: Never  Vaping Use   Vaping Use: Never used  Substance and Sexual Activity   Alcohol use: No   Drug use: No   Sexual activity: Never    Partners: Male  Other Topics Concern   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   Right handed    Social Determinants of Radio broadcast assistant Strain: Low Risk    Difficulty of Paying Living Expenses: Not hard at all  Food Insecurity: No Food Insecurity   Worried About Charity fundraiser in the Last Year:  Never true   Ran Out of Food in the Last Year: Never true  Transportation Needs: No Transportation Needs   Lack of Transportation (Medical): No   Lack of Transportation (Non-Medical): No  Physical Activity: Inactive   Days of Exercise per Week: 0 days   Minutes of Exercise per Session: 0 min  Stress: Not on file  Social Connections: Moderately Isolated   Frequency of Communication with Friends and Family: More than three times a week   Frequency of Social Gatherings with Friends and Family: More than three times a week   Attends Religious Services: More than 4 times per year   Active Member of [employer] or Organizations: No   Attends Archivist Meetings: Never   Marital Status: Widowed  Human resources officer Violence: Not At Risk   Fear of Current or Ex-Partner: No   Emotionally Abused: No   Physically Abused: No   Sexually Abused: No    Outpatient Medications Prior to Visit  Medication Sig Dispense Refill   albuterol (VENTOLIN HFA) 108 (90 Base) MCG/ACT inhaler  Inhale 2 puffs into the lungs every 4 (four) hours as needed for wheezing or shortness of breath. 54 g 1   alendronate (FOSAMAX) 70 MG tablet Take 1 tablet (70 mg total) by mouth every 7 (seven) days. Take with a full glass of water on an empty stomach. 12 tablet 3   ALPRAZolam (XANAX) 0.25 MG tablet Take 1 tablet (0.25 mg total) by mouth at bedtime as needed for anxiety. 30 tablet 0   atorvastatin (LIPITOR) 10 MG tablet TAKE ONE TABLET BY MOUTH EVERYDAY AT BEDTIME 90 tablet 1   donepezil (ARICEPT ODT) 10 MG disintegrating tablet Take 1 tablet (10 mg total) by mouth at bedtime. 90 tablet 3   fluticasone-salmeterol (ADVAIR HFA) 115-21 MCG/ACT inhaler Inhale 2 puffs into the lungs 2 (two) times daily. (Patient taking differently: Inhale 2 puffs into the lungs 2 (two) times daily. As needed) 1 each 12   folic acid (FOLVITE) 1 MG tablet Take 1 mg by mouth daily.     levothyroxine (SYNTHROID) 88 MCG tablet TAKE ONE TABLET BY MOUTH BEFORE BREAKFAST 90 tablet 1   methotrexate (RHEUMATREX) 2.5 MG tablet Take 12.5 mg by mouth once a week. On Wednesdays     Multiple Vitamins-Minerals (PRESERVISION AREDS 2 PO) Take 1 tablet by mouth 2 (two) times daily.     No facility-administered medications prior to visit.    Allergies  Allergen Reactions   Pneumococcal Vaccines     Review of Systems  Constitutional:  Negative for fever.  HENT:  Negative for congestion, ear pain, hearing loss, sinus pain and sore throat.   Eyes:  Negative for blurred vision and pain.  Respiratory:  Negative for cough, sputum production, shortness of breath and wheezing.   Cardiovascular:  Negative for chest pain and palpitations.  Gastrointestinal:  Positive for diarrhea. Negative for blood in stool, constipation, nausea and vomiting.  Genitourinary:  Negative for dysuria, frequency, hematuria and urgency.  Musculoskeletal:  Negative for back pain, falls and myalgias.  Neurological:  Negative for dizziness, sensory change, loss  of consciousness, weakness and headaches.  Endo/Heme/Allergies:  Negative for environmental allergies. Does not bruise/bleed easily.  Psychiatric/Behavioral:  Negative for depression and suicidal ideas. The patient is not nervous/anxious and does not have insomnia.       Objective:    Physical Exam Constitutional:      General: She is not in acute distress.    Appearance: Normal appearance.  She is not ill-appearing.  HENT:     Head: Normocephalic and atraumatic.     Right Ear: External ear normal.     Left Ear: External ear normal.  Eyes:     Extraocular Movements: Extraocular movements intact.     Pupils: Pupils are equal, round, and reactive to light.  Cardiovascular:     Rate and Rhythm: Normal rate and regular rhythm.     Pulses: Normal pulses.     Heart sounds: Normal heart sounds. No murmur heard.   No gallop.  Pulmonary:     Effort: Pulmonary effort is normal. No respiratory distress.     Breath sounds: Normal breath sounds. No wheezing, rhonchi or rales.  Abdominal:     General: Bowel sounds are normal. There is no distension.     Palpations: Abdomen is soft. There is no mass.     Tenderness: There is no abdominal tenderness. There is no guarding or rebound.     Hernia: No hernia is present.  Musculoskeletal:     Cervical back: Normal range of motion and neck supple.     Comments: Bruise on left lower back and cheek  Lymphadenopathy:     Cervical: No cervical adenopathy.  Skin:    General: Skin is warm and dry.  Neurological:     Mental Status: She is alert and oriented to person, place, and time.  Psychiatric:        Behavior: Behavior normal.    BP 124/74 (BP Location: Left Arm, Patient Position: Sitting, Cuff Size: Normal)   Pulse 71   Temp 98.5 F (36.9 C) (Oral)   Resp 20   Ht 5\' 4"  (1.626 m)   Wt 155 lb (70.3 kg)   SpO2 94%   BMI 26.61 kg/m  Wt Readings from Last 3 Encounters:  03/19/21 155 lb (70.3 kg)  03/10/21 147 lb (66.7 kg)  03/08/21 147 lb  (66.7 kg)    Diabetic Foot Exam - Simple   No data filed    Lab Results  Component Value Date   WBC 9.2 03/08/2021   HGB 13.9 03/08/2021   HCT 40.3 03/08/2021   PLT 258 03/08/2021   GLUCOSE 110 (H) 03/08/2021   CHOL 190 11/27/2020   TRIG 142.0 11/27/2020   HDL 64.50 11/27/2020   LDLDIRECT 103.3 03/07/2014   LDLCALC 97 11/27/2020   ALT 11 11/27/2020   AST 15 11/27/2020   NA 138 03/08/2021   K 4.2 03/08/2021   CL 99 03/08/2021   CREATININE 0.67 03/08/2021   BUN 14 03/08/2021   CO2 30 03/08/2021   TSH 3.14 11/27/2020   HGBA1C 6.1 07/20/2012    Lab Results  Component Value Date   TSH 3.14 11/27/2020   Lab Results  Component Value Date   WBC 9.2 03/08/2021   HGB 13.9 03/08/2021   HCT 40.3 03/08/2021   MCV 94.6 03/08/2021   PLT 258 03/08/2021   Lab Results  Component Value Date   NA 138 03/08/2021   K 4.2 03/08/2021   CO2 30 03/08/2021   GLUCOSE 110 (H) 03/08/2021   BUN 14 03/08/2021   CREATININE 0.67 03/08/2021   BILITOT 0.6 11/27/2020   ALKPHOS 84 11/27/2020   AST 15 11/27/2020   ALT 11 11/27/2020   PROT 6.6 11/27/2020   ALBUMIN 4.3 11/27/2020   CALCIUM 9.1 03/08/2021   ANIONGAP 9 03/08/2021   GFR 75.25 11/27/2020   Lab Results  Component Value Date   CHOL 190 11/27/2020  Lab Results  Component Value Date   HDL 64.50 11/27/2020   Lab Results  Component Value Date   LDLCALC 97 11/27/2020   Lab Results  Component Value Date   TRIG 142.0 11/27/2020   Lab Results  Component Value Date   CHOLHDL 3 11/27/2020   Lab Results  Component Value Date   HGBA1C 6.1 07/20/2012       Assessment & Plan:   Problem List Items Addressed This Visit       Unprioritized   COPD exacerbation ([place])    z pak  con't cough med otc and inhalers  rto prn       Relevant Medications   azithromycin (ZITHROMAX Z-PAK) 250 MG tablet   Fall    Pt feels like she could not get to the bathroom quick enough and some urine was on the floor and she slipped and  fell  Bruising is healing Er records reviewed       Other Visit Diagnoses     Acute bronchitis with COPD ([place])    -  Primary   Relevant Medications   azithromycin (ZITHROMAX Z-PAK) 250 MG tablet        Meds ordered this encounter  Medications   azithromycin (ZITHROMAX Z-PAK) 250 MG tablet    Sig: As directed    Dispense:  6 each    Refill:  0    I,Carrie Hall,acting as a scribe for [employer], DO.,have documented all relevant documentation on the behalf of [name], DO,as directed by  [name], DO while in the presence of [name], DO.   I,  [name] DO., personally preformed the services described in this documentation.  All medical record entries made by the scribe were at my direction and in my presence.  I have reviewed the chart and discharge instructions (if applicable) and agree that the record reflects my personal performance and is accurate and complete. 03/19/2021

## 2021-03-24 ENCOUNTER — Telehealth: Payer: Self-pay

## 2021-03-24 DIAGNOSIS — E039 Hypothyroidism, unspecified: Secondary | ICD-10-CM

## 2021-03-24 MED ORDER — LEVOTHYROXINE SODIUM 88 MCG PO TABS: ORAL_TABLET | ORAL | 2 refills | Status: DC

## 2021-03-24 NOTE — Chronic Care Management (AMB) (Signed)
    Chronic Care Management Pharmacy Assistant   Name: Carrie Hall  MRN: [id number] DOB: 19-May-1933  Reason for Encounter: Medication coordination   Reviewed chart for medication changes ahead of medication coordination call.  No OVs, Consults, or hospital visits since last care coordination call/Pharmacist visit. (If appropriate, list visit date, provider name)  No medication changes indicated OR if recent visit, treatment plan here.  BP Readings from Last 3 Encounters:  03/19/21 124/74  03/10/21 125/69  03/08/21 (!) 154/87    Lab Results  Component Value Date   HGBA1C 6.1 07/20/2012     Patient obtains medications through Vials  90 Days    Patient is due for next adherence delivery on: 04/05/21 . Called patient and reviewed medications and coordinated delivery.  This delivery to include: Levothyroxine 88 mcg 1 tab before breakfast  Atorvastatin 10 mg 1 tab at bedtime    Patient needs refills for Levothyroxine 88 mcg  Confirmed delivery date of 04/05/21, advised patient that pharmacy will contact them the morning of delivery.   Andee Poles, CMA

## 2021-03-24 NOTE — Telephone Encounter (Signed)
Prescription for levothyroxine 46mcg daily sent to [facility] - #90 with 2RF

## 2021-03-24 NOTE — Addendum Note (Signed)
Addended by: Cherre Robins B on: 03/24/2021 04:54 PM   Modules accepted: Orders

## 2021-04-15 ENCOUNTER — Emergency Department
Admission: EM | Admit: 2021-04-15 | Discharge: 2021-04-15 | Disposition: A | Discharge status: Home / Self Care | Payer: Medicare HMO | Source: Home / Self Care | Attending: Family Medicine | Admitting: Family Medicine

## 2021-04-15 ENCOUNTER — Other Ambulatory Visit: Payer: Self-pay

## 2021-04-15 ENCOUNTER — Emergency Department (INDEPENDENT_AMBULATORY_CARE_PROVIDER_SITE_OTHER): Payer: Medicare HMO

## 2021-04-15 DIAGNOSIS — S83411A Sprain of medial collateral ligament of right knee, initial encounter: Secondary | ICD-10-CM

## 2021-04-15 DIAGNOSIS — M25561 Pain in right knee: Secondary | ICD-10-CM

## 2021-04-15 NOTE — ED Triage Notes (Signed)
Rt Knee pain, swollen x 3 days

## 2021-04-15 NOTE — ED Provider Notes (Signed)
Carrie Hall [facility]    CSN: [id number] Arrival date & time: 04/15/21  0904      History   Chief Complaint Chief Complaint  Patient presents with   Knee Pain    HPI Carrie Hall is a 87 y.o. female.   After sleeping on her couch three days ago, patient awoke with pain/swelling of her right anterior knee.  Her swelling has decreased but she still has mild pain with weight bearing.  She recalls no injury or change in activities.  The history is provided by the patient.  Knee Pain Location:  Knee Time since incident:  3 days Injury: no   Knee location:  R knee Pain details:    Quality:  Aching   Radiates to:  Does not radiate   Severity:  Moderate   Onset quality:  Sudden   Duration:  3 days   Timing:  Constant   Progression:  Improving Chronicity:  New Prior injury to area:  No Relieved by:  None tried Worsened by:  Bearing weight, extension and flexion Associated symptoms: decreased ROM and stiffness   Associated symptoms: no fatigue, no fever, no muscle weakness and no tingling    Past Medical History:  Diagnosis Date   Adenomatous colon polyp    Breast cancer ([place])    COPD (chronic obstructive pulmonary disease) ([place])    Diverticulosis    Hyperlipidemia    Internal hemorrhoids    Osteopenia    Thyroid disease     Patient Active Problem List   Diagnosis Date Noted   Fall 03/19/2021   Frequent urination 11/27/2020   COPD exacerbation ([place]) 11/27/2020   Aortic atherosclerosis ([place]) 08/12/2020   Oliguria 08/11/2020   Acute right-sided low back pain without sciatica 08/11/2020   Psoriasis 11/18/2019   Essential hypertension 02/12/2018   Generalized anxiety disorder 01/01/2018   Mild cognitive impairment 11/10/2017   Elevated LFTs 10/04/2017   Loss of weight 10/04/2017   Loose stools 10/04/2017   Weight loss, non-intentional 08/07/2017   Preventative health care 08/07/2017   Cerumen impaction 08/25/2015   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    Memory loss 08/25/2015   Diarrhea 12/19/2014   KNEE PAIN, LEFT 05/27/2010   DYSURIA 05/27/2010   POSTMENOPAUSAL STATUS 04/23/2010   CERVICAL POLYP 10/26/2009   HEMATURIA, HX OF 10/26/2009   DIZZINESS 06/11/2009   FOOT, PAIN 11/17/2008   OTHER ABNORMAL BLOOD CHEMISTRY 11/17/2008   TINEA CRURIS 09/15/2008   UTI 09/15/2008   COLONIC POLYPS, ADENOMATOUS, HX OF 01/16/2008   HEMOCCULT POSITIVE STOOL 12/11/2007   BREAST CANCER, HX OF 12/11/2007   Hypothyroidism 12/07/2007   Hyperlipidemia LDL goal <100 12/07/2007   COPD with chronic bronchitis ([place]) 12/07/2007   Disorder of bone and articular cartilage 12/07/2007   URI 03/13/2007   HX, URINARY INFECTION 01/09/2007    Past Surgical History:  Procedure Laterality Date   BREAST LUMPECTOMY Left    radiation only   BREAST SURGERY     colon polyps      OB History   No obstetric history on file.      Home Medications    Prior to Admission medications   Medication Sig Start Date End Date Taking? Authorizing Provider  albuterol (VENTOLIN HFA) 108 (90 Base) MCG/ACT inhaler Inhale 2 puffs into the lungs every 4 (four) hours as needed for wheezing or shortness of breath. 11/27/20   Carollee Herter, Alferd Apa, DO  alendronate (FOSAMAX) 70 MG tablet Take 1 tablet (70 mg total)  by mouth every 7 (seven) days. Take with a full glass of water on an empty stomach. 09/24/20   [name], DO  ALPRAZolam (XANAX) 0.25 MG tablet Take 1 tablet (0.25 mg total) by mouth at bedtime as needed for anxiety. 12/17/20   [name], DO  atorvastatin (LIPITOR) 10 MG tablet TAKE ONE TABLET BY MOUTH EVERYDAY AT BEDTIME 12/25/20   Carollee Herter, Yvonne R, DO  donepezil (ARICEPT ODT) 10 MG disintegrating tablet Take 1 tablet (10 mg total) by mouth at bedtime. 03/23/20   [name], DO  fluticasone-salmeterol (ADVAIR HFA) 111-55 MCG/ACT inhaler Inhale 2 puffs into the lungs 2 (two) times daily. Patient taking differently: Inhale 2 puffs into  the lungs 2 (two) times daily. As needed 10/07/20   Carollee Herter, Alferd Apa, DO  folic acid (FOLVITE) 1 MG tablet Take 1 mg by mouth daily.    [provider]  levothyroxine (SYNTHROID) 88 MCG tablet TAKE ONE TABLET BY MOUTH BEFORE BREAKFAST 03/24/21   Eckard, Tammy, RPH-CPP  methotrexate (RHEUMATREX) 2.5 MG tablet Take 12.5 mg by mouth once a week. On Wednesdays 11/09/20   [provider]  Multiple Vitamins-Minerals (PRESERVISION AREDS 2 PO) Take 1 tablet by mouth 2 (two) times daily.    [provider]    Family History Family History  Problem Relation Age of Onset   Alzheimer's disease Mother    Cancer Father        lung   Alzheimer's disease Maternal Grandmother    Rectal cancer Sister        in her 18s    Social History Social History   Tobacco Use   Smoking status: Former    Packs/day: 0.50    Types: Cigarettes    Quit date: 12/06/2003    Years since quitting: 17.3   Smokeless tobacco: Never  Vaping Use   Vaping Use: Never used  Substance Use Topics   Alcohol use: No   Drug use: No     Allergies   Pneumococcal vaccines   Review of Systems Review of Systems  Constitutional:  Positive for activity change. Negative for chills, diaphoresis, fatigue and fever.  Musculoskeletal:  Positive for arthralgias, joint swelling and stiffness.  Skin:  Negative for rash.  All other systems reviewed and are negative.   Physical Exam Triage Vital Signs ED Triage Vitals  Enc Vitals Group     BP 04/15/21 0938 121/66     Pulse Rate 04/15/21 0938 71     Resp 04/15/21 0938 18     Temp 04/15/21 0938 98.7 F (37.1 C)     Temp Source 04/15/21 0938 Oral     SpO2 04/15/21 0938 96 %     Weight 04/15/21 0939 147 lb (66.7 kg)     Height 04/15/21 0939 5\' 4"  (1.626 m)     Head Circumference --      Peak Flow --      Pain Score 04/15/21 0939 10     Pain Loc --      Pain Edu? --      Excl. in [place]? --    No data found.  Updated Vital Signs BP 121/66 (BP  Location: Left Arm)   Pulse 71   Temp 98.7 F (37.1 C) (Oral)   Resp 18   Ht 5\' 4"  (1.626 m)   Wt 66.7 kg   SpO2 96%   BMI 25.23 kg/m   Visual Acuity Right Eye Distance:  Left Eye Distance:   Bilateral Distance:    Right Eye Near:   Left Eye Near:    Bilateral Near:     Physical Exam Constitutional:      General: She is not in acute distress. HENT:     Head: Normocephalic.  Eyes:     Pupils: Pupils are equal, round, and reactive to light.  Cardiovascular:     Rate and Rhythm: Normal rate.  Pulmonary:     Effort: Pulmonary effort is normal.  Musculoskeletal:     Cervical back: Normal range of motion.     Right knee: Swelling and bony tenderness present. No deformity or erythema. Decreased range of motion. Tenderness present over the MCL.     Right lower leg: No edema.     Left lower leg: No edema.       Legs:     Comments: Mild localized swelling/tenderness lateral to patella. Tenderness over MCL Right knee:  No effusion, erythema, or warmth.  Knee stable, negative drawer test.  McMurray test negative.   Skin:    General: Skin is warm and dry.     Findings: No rash.  Neurological:     Mental Status: She is alert.     UC Treatments / Results  Labs (all labs ordered are listed, but only abnormal results are displayed) Labs Reviewed - No data to display  EKG   Radiology DG Knee Complete 4 Views Right  Result Date: 04/15/2021 CLINICAL DATA:  Acute right knee pain without known injury. EXAM: RIGHT KNEE - COMPLETE 4+ VIEW COMPARISON:  None. FINDINGS: No evidence of fracture, dislocation, or joint effusion. No evidence of arthropathy or other focal bone abnormality. Soft tissues are unremarkable. IMPRESSION: Negative. Electronically Signed   By: Marijo Conception M.D.   On: 04/15/2021 11:26    Procedures Procedures (including critical care time)  Medications Ordered in UC Medications - No data to display  Initial Impression / Assessment and Plan / UC Course   I have reviewed the triage vital signs and the nursing notes.  Pertinent labs & imaging results that were available during my care of the patient were reviewed by me and considered in my medical decision making (see chart for details).    Right knee x-ray unremarkable. Ace wrap applied. Given sprain treatment instructions with range of motion and stretching exercises.  Followup with Dr. Aundria Mems ([facility]) if not improving about two weeks.   Final Clinical Impressions(s) / UC Diagnoses   Final diagnoses:  Sprain of medial collateral ligament of right knee, initial encounter     Discharge Instructions      Apply ice pack to right knee for 20 to 30 minutes, 3 to 4 times daily  Continue until pain and swelling decrease.  Wear ace wrap daily until swelling resolves.  May take Tylenol as needed for pain.  Begin range of motion and stretching exercises as tolerated.     ED Prescriptions   None       Kandra Nicolas, MD 04/16/21 2237

## 2021-04-15 NOTE — Discharge Instructions (Signed)
Apply ice pack to right knee for 20 to 30 minutes, 3 to 4 times daily  Continue until pain and swelling decrease.  Wear ace wrap daily until swelling resolves.  May take Tylenol as needed for pain.  Begin range of motion and stretching exercises as tolerated.

## 2021-04-21 ENCOUNTER — Ambulatory Visit (INDEPENDENT_AMBULATORY_CARE_PROVIDER_SITE_OTHER): Payer: Medicare HMO | Admitting: Pharmacist

## 2021-04-21 ENCOUNTER — Other Ambulatory Visit: Payer: Self-pay | Admitting: Neurology

## 2021-04-21 DIAGNOSIS — G3184 Mild cognitive impairment, so stated: Secondary | ICD-10-CM

## 2021-04-21 DIAGNOSIS — J449 Chronic obstructive pulmonary disease, unspecified: Secondary | ICD-10-CM

## 2021-04-21 DIAGNOSIS — E785 Hyperlipidemia, unspecified: Secondary | ICD-10-CM

## 2021-04-21 DIAGNOSIS — M8580 Other specified disorders of bone density and structure, unspecified site: Secondary | ICD-10-CM

## 2021-04-21 NOTE — Patient Instructions (Signed)
Mrs. Roa It was a pleasure speaking with you today.  I have attached a summary of our visit today and information about your health goals.   Our next appointment is by telephone on May 27, 2021 at 1:45pm  Please call the care guide team at [phone] if you need to cancel or reschedule your appointment.   If you have any questions or concerns, please feel free to contact me either at the phone number below or with a MyChart message.   Keep up the good work!  Cherre Robins, PharmD Clinical Pharmacist [facility] [phone] (direct line)  [phone] (main office number)   Patient verbalizes understanding of instructions provided today and agrees to view in [place].    Patient Goals/Self-Care Activities Over the next 90 days, patient will:  take medications as prescribed focus on medication adherence by using syncronized filling;  also discussed taking donepezil with evening meal to increase adherence engage in dietary modifications by limiting trans and saturated fat intake Take Advair twice a day EVERY day - inhale 2 puffs into lung twice a day. Rinse mouth after each use.

## 2021-04-21 NOTE — Chronic Care Management (AMB) (Signed)
Chronic Care Management Pharmacy Note  04/21/2021 Name:  Carrie Hall MRN:  [id number] DOB:  03-21-1934   Subjective: Carrie Hall is an 87 y.o. year old female who is a primary patient of Donato Schultz, DO.  The CCM team was consulted for assistance with disease management and care coordination needs.    Engaged with patient by telephone for follow up visit in response to provider referral for pharmacy case management and/or care coordination services.   Consent to Services:  The patient was given information about Chronic Care Management services, agreed to services, and gave verbal consent prior to initiation of services.  Please see initial visit note for detailed documentation.   Patient Care Team: Zola Button, Grayling Congress, DO as PCP - General Karel Jarvis Lesle Chris, MD as Consulting Physician (Neurology) Hollar, Ronal Fear, MD as Referring Physician (Dermatology) Henrene Pastor, RPH-CPP (Pharmacist)  Recent Office Visit:  03/19/2021 - [facility] (Dr Laury Axon) Follow up ED visits. No wheeing noted for still some cough. Still hoarse. Prescribed azithromycin x 5 days for acute bronchitis.   Recent Consults:  None in last 6 months  Recent hospital visit:  04/15/2021 - ED Visit at [facility]. Pain and swelling of her right anterior knee. Continues to have pain in area as well. Xray ordered - unremarkable. Recommended ACE wrap. provided with stretching exercises. 03/10/2021 - ED Visit at [facility] for fall. Pain in left buttock after fall in her bathroom. Patient desated to 84% with ambiluation in ED. Received albuterol neb in ER and xray ordered of chest and lumbar spine. No evidience of pneumonia or hip / spine fracture on xray. No more O2 decats. Discharged home. No medicaton changes noted.  03/08/2021 - ED Visit at [facility] for SOB / COPD exacerbation. Given prednisone 60mg  in ED and DuoNeb treatment with improvement. Discharged with 4 day  course of prednisone 40mg  and instructed to use Advair every day and albuterol as needed.   Objective:  Lab Results  Component Value Date   CREATININE 0.67 03/08/2021   CREATININE 0.73 11/27/2020   CREATININE 0.64 08/24/2020    Lab Results  Component Value Date   HGBA1C 6.1 07/20/2012   Last diabetic Eye exam: No results found for: HMDIABEYEEXA  Last diabetic Foot exam: No results found for: HMDIABFOOTEX      Component Value Date/Time   CHOL 190 11/27/2020 0906   TRIG 142.0 11/27/2020 0906   TRIG 89 02/27/2006 0859   HDL 64.50 11/27/2020 0906   CHOLHDL 3 11/27/2020 0906   VLDL 28.4 11/27/2020 0906   LDLCALC 97 11/27/2020 0906   LDLDIRECT 103.3 03/07/2014 1123    Hepatic Function Latest Ref Rng & Units 11/27/2020 05/22/2020 11/18/2019  Total Protein 6.0 - 8.3 g/dL 6.6 6.3 6.3  Albumin 3.5 - 5.2 g/dL 4.3 4.2 4.3  AST 0 - 37 U/L 15 22 14   ALT 0 - 35 U/L 11 17 11   Alk Phosphatase 39 - 117 U/L 84 71 68  Total Bilirubin 0.2 - 1.2 mg/dL 0.6 0.7 0.7  Bilirubin, Direct 0.0 - 0.3 mg/dL - - -    Lab Results  Component Value Date/Time   TSH 3.14 11/27/2020 09:06 AM   TSH 1.34 05/22/2020 09:26 AM   FREET4 1.1 12/11/2007 12:00 AM    CBC Latest Ref Rng & Units 03/08/2021 11/27/2020 05/28/2020  WBC 4.0 - 10.5 K/uL 9.2 6.3 6.1  Hemoglobin 12.0 - 15.0 g/dL 76.1 60.7 37.1  Hematocrit  36.0 - 46.0 % 40.3 43.9 38.8  Platelets 150 - 400 K/uL 258 240.0 159    Lab Results  Component Value Date/Time   VD25OH 50 04/22/2010 08:21 PM   VD25OH 34 06/11/2009 12:00 AM    Clinical ASCVD:  noted to have aortic athersclerosis The ASCVD Risk score (Arnett DK, et al., 2019) failed to calculate for the following reasons:   The 2019 ASCVD risk score is only valid for ages 69 to 18     Social History   Tobacco Use  Smoking Status Former   Packs/day: 0.50   Types: Cigarettes   Quit date: 12/06/2003   Years since quitting: 17.3  Smokeless Tobacco Never   BP Readings from Last 3 Encounters:   04/15/21 121/66  03/19/21 124/74  03/10/21 125/69   Pulse Readings from Last 3 Encounters:  04/15/21 71  03/19/21 71  03/10/21 61   Wt Readings from Last 3 Encounters:  04/15/21 147 lb (66.7 kg)  03/19/21 155 lb (70.3 kg)  03/10/21 147 lb (66.7 kg)    Assessment: Review of patient past medical history, allergies, medications, health status, including review of consultants reports, laboratory and other test data, was performed as part of comprehensive evaluation and provision of chronic care management services.   SDOH:  (Social Determinants of Health) assessments and interventions performed:    CCM Care Plan  Allergies  Allergen Reactions   Pneumococcal Vaccines     Medications Reviewed Today     Reviewed by Doyle Askew, CMA (Certified Medical Assistant) on 04/15/21 at 0940  Med List Status: <None>   Medication Order Taking? Sig Documenting Provider Last Dose Status Informant  albuterol (VENTOLIN HFA) 108 (90 Base) MCG/ACT inhaler [id number]  Inhale 2 puffs into the lungs every 4 (four) hours as needed for wheezing or shortness of breath. Zola Button, Grayling Congress, DO  Active   alendronate (FOSAMAX) 70 MG tablet [id number]  Take 1 tablet (70 mg total) by mouth every 7 (seven) days. Take with a full glass of water on an empty stomach. Zola Button, Grayling Congress, DO  Active   ALPRAZolam Prudy Feeler) 0.25 MG tablet [id number]  Take 1 tablet (0.25 mg total) by mouth at bedtime as needed for anxiety. Zola Button, Myrene Buddy R, DO  Active   atorvastatin (LIPITOR) 10 MG tablet [id number]  TAKE ONE TABLET BY MOUTH EVERYDAY AT BEDTIME Donato Schultz, DO  Active     Discontinued 04/15/21 0940   donepezil (ARICEPT ODT) 10 MG disintegrating tablet [id number]  Take 1 tablet (10 mg total) by mouth at bedtime. Donato Schultz, DO  Active            Med Note Clydie Braun, Sahir Tolson B   Thu Jan 28, 2021 11:28 AM) Ok to take with evening meal so she does not miss dose at bedtime.   fluticasone-salmeterol  (ADVAIR HFA) 115-21 MCG/ACT inhaler [id number]  Inhale 2 puffs into the lungs 2 (two) times daily.  Patient taking differently: Inhale 2 puffs into the lungs 2 (two) times daily. As needed   Zola Button, Grayling Congress, DO  Active   folic acid (FOLVITE) 1 MG tablet [id number]  Take 1 mg by mouth daily. [provider]  Active   levothyroxine (SYNTHROID) 88 MCG tablet [id number]  TAKE ONE TABLET BY MOUTH BEFORE BREAKFAST Ezequiel Macauley, RPH-CPP  Active   methotrexate (RHEUMATREX) 2.5 MG tablet [id number]  Take 12.5 mg by mouth once a week. On Wednesdays [provider]  Active  Multiple Vitamins-Minerals (PRESERVISION AREDS 2 PO) [id number]  Take 1 tablet by mouth 2 (two) times daily. [provider]  Active             Patient Active Problem List   Diagnosis Date Noted   Fall 03/19/2021   Frequent urination 11/27/2020   COPD exacerbation (HCC) 11/27/2020   Aortic atherosclerosis (HCC) 08/12/2020   Oliguria 08/11/2020   Acute right-sided low back pain without sciatica 08/11/2020   Psoriasis 11/18/2019   Essential hypertension 02/12/2018   Generalized anxiety disorder 01/01/2018   Mild cognitive impairment 11/10/2017   Elevated LFTs 10/04/2017   Loss of weight 10/04/2017   Loose stools 10/04/2017   Weight loss, non-intentional 08/07/2017   Preventative health care 08/07/2017   Cerumen impaction 08/25/2015   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   Memory loss 08/25/2015   Diarrhea 12/19/2014   KNEE PAIN, LEFT 05/27/2010   DYSURIA 05/27/2010   POSTMENOPAUSAL STATUS 04/23/2010   CERVICAL POLYP 10/26/2009   HEMATURIA, HX OF 10/26/2009   DIZZINESS 06/11/2009   FOOT, PAIN 11/17/2008   OTHER ABNORMAL BLOOD CHEMISTRY 11/17/2008   TINEA CRURIS 09/15/2008   UTI 09/15/2008   COLONIC POLYPS, ADENOMATOUS, HX OF 01/16/2008   HEMOCCULT POSITIVE STOOL 12/11/2007   BREAST CANCER, HX OF 12/11/2007   Hypothyroidism 12/07/2007   Hyperlipidemia LDL goal <100 12/07/2007    COPD with chronic bronchitis (HCC) 12/07/2007   Disorder of bone and articular cartilage 12/07/2007   URI 03/13/2007   HX, URINARY INFECTION 01/09/2007    Immunization History  Administered Date(s) Administered   Fluad Quad(high Dose 65+) 03/11/2019, 03/23/2020, 02/02/2021   Influenza Split 03/15/2011   Influenza Whole 03/21/2007, 02/04/2008, 02/25/2009, 03/11/2010, 02/07/2012   Influenza, High Dose Seasonal PF 03/07/2014, 01/28/2016, 02/22/2017, 01/13/2018   Influenza,inj,Quad PF,6+ Mos 02/14/2013, 02/26/2015   PFIZER(Purple Top)SARS-COV-2 Vaccination 08/01/2019, 08/26/2019   Pneumococcal Conjugate-13 03/07/2014   Pneumococcal Polysaccharide-23 05/15/2006, 12/11/2007, 02/12/2018   Td 02/24/2004   Tdap 08/07/2017   Zoster, Live 11/02/2009    Conditions to be addressed/monitored: HLD, COPD, and aortic atherosclerosis,osteoporosis, mild cognitive impairment , psoriasis, hypothyroidism, ostoeporosis  There are no care plans that you recently modified to display for this patient.   Medication Assistance: None required.  Patient affirms current coverage meets needs.  Patient's preferred pharmacy is:  [facility] - [place], [place] - [address] [place] [address] Phone: [phone] Fax: [phone]  [facility] [address] [place], [place] - [address] [place] [address] Phone: [phone] Fax: [phone]   Pt endorses 95% compliance  Follow Up:  Patient agrees to Care Plan and Follow-up.  Plan: Telephone follow up appointment with care management team member scheduled for:  1 month  Henrene Pastor, PharmD Clinical Pharmacist [facility]

## 2021-05-04 ENCOUNTER — Other Ambulatory Visit: Payer: Self-pay | Admitting: Neurology

## 2021-05-06 ENCOUNTER — Other Ambulatory Visit: Payer: Self-pay

## 2021-05-06 ENCOUNTER — Telehealth: Payer: Self-pay | Admitting: Physician Assistant

## 2021-05-06 NOTE — Telephone Encounter (Signed)
Patient has a question regarding her refill donepezil.

## 2021-05-06 NOTE — Telephone Encounter (Signed)
[facility]

## 2021-05-08 DIAGNOSIS — E785 Hyperlipidemia, unspecified: Secondary | ICD-10-CM

## 2021-05-08 DIAGNOSIS — J449 Chronic obstructive pulmonary disease, unspecified: Secondary | ICD-10-CM

## 2021-05-10 ENCOUNTER — Other Ambulatory Visit: Payer: Self-pay | Admitting: Family Medicine

## 2021-05-12 ENCOUNTER — Encounter: Payer: Self-pay | Admitting: Physician Assistant

## 2021-05-14 ENCOUNTER — Telehealth: Payer: Self-pay | Admitting: Family Medicine

## 2021-05-14 DIAGNOSIS — F0280 Dementia in other diseases classified elsewhere without behavioral disturbance: Secondary | ICD-10-CM

## 2021-05-14 DIAGNOSIS — G309 Alzheimer's disease, unspecified: Secondary | ICD-10-CM

## 2021-05-14 MED ORDER — DONEPEZIL HCL 10 MG PO TBDP: 10.0000 mg | ORAL_TABLET | Freq: Every day | ORAL | 0 refills | Status: DC

## 2021-05-14 NOTE — Telephone Encounter (Signed)
Refill sent.

## 2021-05-14 NOTE — Telephone Encounter (Signed)
Medication: donepezil (ARICEPT ODT) 10 MG disintegrating tablet  Has the patient contacted their pharmacy? Yes.   (If no, request that the patient contact the pharmacy for the refill.) (If yes, when and what did the pharmacy advise?)  Preferred Pharmacy (with phone number or street name): [facility] - [place], [place] - [place] [address]  [address], [place] [address]  Phone:  [phone]  Fax:  [phone]   Agent: Please be advised that RX refills may take up to 3 business days. We ask that you follow-up with your pharmacy.

## 2021-05-27 ENCOUNTER — Ambulatory Visit (INDEPENDENT_AMBULATORY_CARE_PROVIDER_SITE_OTHER): Payer: Medicare HMO | Admitting: Pharmacist

## 2021-05-27 DIAGNOSIS — E039 Hypothyroidism, unspecified: Secondary | ICD-10-CM

## 2021-05-27 DIAGNOSIS — G309 Alzheimer's disease, unspecified: Secondary | ICD-10-CM

## 2021-05-27 DIAGNOSIS — E785 Hyperlipidemia, unspecified: Secondary | ICD-10-CM

## 2021-05-27 DIAGNOSIS — J449 Chronic obstructive pulmonary disease, unspecified: Secondary | ICD-10-CM

## 2021-05-27 DIAGNOSIS — M8580 Other specified disorders of bone density and structure, unspecified site: Secondary | ICD-10-CM

## 2021-05-27 DIAGNOSIS — F0280 Dementia in other diseases classified elsewhere without behavioral disturbance: Secondary | ICD-10-CM

## 2021-05-27 NOTE — Patient Instructions (Signed)
Carrie Hall It was a pleasure speaking with you today.  I have attached a summary of our visit today and information about your health goals.   Patient Goals/Self-Care Activities Over the next 90 days, patient will:  take medications as prescribed focus on medication adherence by using syncronized filling;  Continue to take donepezil with evening meal to increase adherence engage in dietary modifications by limiting trans and saturated fat intake  Our next appointment is by telephone on Jul 01, 2021 at 2:30pm  Please call the care guide team at [phone] if you need to cancel or reschedule your appointment.     If you have any questions or concerns, please feel free to contact me either at the phone number below or with a MyChart message.   Keep up the good work!  Cherre Robins, PharmD Clinical Pharmacist [facility] [phone] (direct line)  [phone] (main office number)  CARE PLAN ENTRY (Updated 05/27/2021)  Blood pressure monitoring BP Readings from Last 3 Encounters:  04/15/21 121/66  03/19/21 124/74  03/10/21 125/69   Pharmacist Clinical Goal(s): Over the next 180 days, patient will work with PharmD and providers to maintain BP goal <140/90 Current regimen:  Diet and exercise management   Patient self care activities - Over the next 180 days, patient will: Maintain blood pressure <140/90  Hyperlipidemia Lab Results  Component Value Date/Time   LDLCALC 97 11/27/2020 09:06 AM   LDLDIRECT 103.3 03/07/2014 11:23 AM   Pharmacist Clinical Goal(s): Over the next 180 days, patient will work with PharmD and providers to maintain LDL goal < 100 Current regimen:  Atorvastatin 10mg  daily Interventions: Recommended limiting intake of saturated and trans fat. Patient self care activities - Over the next 180 days, patient will: Maintain cholesterol medication regimen.  Limit intake of saturated and trans  fat.   COPD Pharmacist Clinical Goal(s) Over the next 180 days, patient will work with PharmD and providers to reduce symptoms associated with COPD Current regimen:  Advair HFA 115-5mcg / actuation -  Inhale 2 puffs into lungs twice daily Ventolin / albuterol - inhaler 2 puffs up to every 4 hours as needed for  shortness of breath / wheezing Interventions: Discussed maintenance versus rescue inhaler.  Patient to start using Advair HFA twice a day. Reminded to rinse / gargle after each use of Advair inhaler Patient self care activities - Over the next 180 days, patient will: Advair (purple inhaler) is your maintenance inhaler - use every day; 2 puffs into lungs twice a day Remember to rinse mouth / gargle with water after each use of Advair Albuterol / Ventolin (black or dark blue inhaler) is your rescue inhaler  - use when needed for shortness of breath and wheezing.   Osteoporosis Pharmacist Clinical Goal(s) Over the next 180 days, patient will work with PharmD and providers to reduce risk of fracture due to osteoporosis  Current regimen:  Alendronate 70mg  weekly Interventions: Consider repeat DEXA (last was 10/06/2017) Patient self care activities - Over the next 180 days, patient will: Maintain osteoporosis medication regimen Complete DEXA Scan  Medication management Goal: Achieve optimal medication adherence Patient reports she misses donepezil about once every 2 weeks. She falls asleep and forgets to take.  Current [facility] Comprehensive medication review performed. Reviewed adherence and refill history Assisted in reordering donepezil refill Patient self care activities - Over the next 180 days, patient will: Focus on medication adherence by filling and taking medications appropriately  Take medications  as prescribed Report any questions or concerns to PharmD and/or provider(s) Continue to take donepezil with your evening meal instead of bedtime  so you won't forget to take it.    Patient verbalizes understanding of instructions and care plan provided today and agrees to view in [place]. Active MyChart status confirmed with patient.

## 2021-05-27 NOTE — Chronic Care Management (AMB) (Signed)
Chronic Care Management Pharmacy Note  05/27/2021 Name:  Carrie Hall MRN:  [id number] DOB:  Nov 16, 1933   Subjective: Carrie Hall is an 88 y.o. year old female who is a primary patient of Donato Schultz, DO.  The CCM team was consulted for assistance with disease management and care coordination needs.    Engaged with patient by telephone for follow up visit in response to provider referral for pharmacy case management and/or care coordination services.   Consent to Services:  The patient was given information about Chronic Care Management services, agreed to services, and gave verbal consent prior to initiation of services.  Please see initial visit note for detailed documentation.   Patient Care Team: Zola Button, Grayling Congress, DO as PCP - General Karel Jarvis Lesle Chris, MD as Consulting Physician (Neurology) Hollar, Ronal Fear, MD as Referring Physician (Dermatology) Henrene Pastor, RPH-CPP (Pharmacist)  Recent Office Visit:  03/19/2021 - [facility]am Med (Dr Laury Axon) Follow up ED visits. No wheeing noted for still some cough. Still hoarse. Prescribed azithromycin x 5 days for acute bronchitis.   Recent Consults:  12/07/2020 - Neurology Gwynneth Munson, NP) cognitive decline  Recent hospital visit:  04/15/2021 - ED Visit at [facility]. Pain and swelling of her right anterior knee. Continues to have pain in area as well. Xray ordered - unremarkable. Recommended ACE wrap. provided with stretching exercises. 03/10/2021 - ED Visit at [facility] for fall. Pain in left buttock after fall in her bathroom. Patient desated to 84% with ambiluation in ED. Received albuterol neb in ER and xray ordered of chest and lumbar spine. No evidience of pneumonia or hip / spine fracture on xray. No more O2 decats. Discharged home. No medicaton changes noted.  03/08/2021 - ED Visit at [facility] for SOB / COPD exacerbation. Given prednisone 60mg  in ED and DuoNeb treatment with  improvement. Discharged with 4 day course of prednisone 40mg  and instructed to use Advair every day and albuterol as needed.   Objective:  Lab Results  Component Value Date   CREATININE 0.67 03/08/2021   CREATININE 0.73 11/27/2020   CREATININE 0.64 08/24/2020    Lab Results  Component Value Date   HGBA1C 6.1 07/20/2012   Last diabetic Eye exam: No results found for: HMDIABEYEEXA  Last diabetic Foot exam: No results found for: HMDIABFOOTEX      Component Value Date/Time   CHOL 190 11/27/2020 0906   TRIG 142.0 11/27/2020 0906   TRIG 89 02/27/2006 0859   HDL 64.50 11/27/2020 0906   CHOLHDL 3 11/27/2020 0906   VLDL 28.4 11/27/2020 0906   LDLCALC 97 11/27/2020 0906   LDLDIRECT 103.3 03/07/2014 1123    Hepatic Function Latest Ref Rng & Units 11/27/2020 05/22/2020 11/18/2019  Total Protein 6.0 - 8.3 g/dL 6.6 6.3 6.3  Albumin 3.5 - 5.2 g/dL 4.3 4.2 4.3  AST 0 - 37 U/L 15 22 14   ALT 0 - 35 U/L 11 17 11   Alk Phosphatase 39 - 117 U/L 84 71 68  Total Bilirubin 0.2 - 1.2 mg/dL 0.6 0.7 0.7  Bilirubin, Direct 0.0 - 0.3 mg/dL - - -    Lab Results  Component Value Date/Time   TSH 3.14 11/27/2020 09:06 AM   TSH 1.34 05/22/2020 09:26 AM   FREET4 1.1 12/11/2007 12:00 AM    CBC Latest Ref Rng & Units 03/08/2021 11/27/2020 05/28/2020  WBC 4.0 - 10.5 K/uL 9.2 6.3 6.1  Hemoglobin 12.0 - 15.0 g/dL 25.9 56.3 13.1  Hematocrit 36.0 - 46.0 % 40.3 43.9 38.8  Platelets 150 - 400 K/uL 258 240.0 159    Lab Results  Component Value Date/Time   VD25OH 50 04/22/2010 08:21 PM   VD25OH 34 06/11/2009 12:00 AM    Clinical ASCVD:  noted to have aortic athersclerosis The ASCVD Risk score (Arnett DK, et al., 2019) failed to calculate for the following reasons:   The 2019 ASCVD risk score is only valid for ages 2 to 72     Social History   Tobacco Use  Smoking Status Former   Packs/day: 0.50   Types: Cigarettes   Quit date: 12/06/2003   Years since quitting: 17.4  Smokeless Tobacco Never    BP Readings from Last 3 Encounters:  04/15/21 121/66  03/19/21 124/74  03/10/21 125/69   Pulse Readings from Last 3 Encounters:  04/15/21 71  03/19/21 71  03/10/21 61   Wt Readings from Last 3 Encounters:  04/15/21 147 lb (66.7 kg)  03/19/21 155 lb (70.3 kg)  03/10/21 147 lb (66.7 kg)    Assessment: Review of patient past medical history, allergies, medications, health status, including review of consultants reports, laboratory and other test data, was performed as part of comprehensive evaluation and provision of chronic care management services.   SDOH:  (Social Determinants of Health) assessments and interventions performed:  SDOH Interventions    Flowsheet Row Most Recent Value  SDOH Interventions   Financial Strain Interventions Intervention Not Indicated       CCM Care Plan  Allergies  Allergen Reactions   Pneumococcal Vaccines     Medications Reviewed Today     Reviewed by Henrene Pastor, RPH-CPP (Pharmacist) on 05/27/21 at 1412  Med List Status: <None>   Medication Order Taking? Sig Documenting Provider Last Dose Status Informant  acetaminophen (TYLENOL) 325 MG tablet [id number] Yes Take 650 mg by mouth every 8 (eight) hours as needed for moderate pain. [provider] Taking Active   albuterol (VENTOLIN HFA) 108 (90 Base) MCG/ACT inhaler [id number] Yes Inhale 2 puffs into the lungs every 4 (four) hours as needed for wheezing or shortness of breath. Donato Schultz, DO Taking Active   alendronate (FOSAMAX) 70 MG tablet [id number] Yes Take 1 tablet (70 mg total) by mouth every 7 (seven) days. Take with a full glass of water on an empty stomach. Donato Schultz, DO Taking Active            Med Note [facility], Cindie Laroche May 27, 2021  2:08 PM) On mondays  ALPRAZolam Prudy Feeler) 0.25 MG tablet [id number] Yes Take 1 tablet (0.25 mg total) by mouth at bedtime as needed for anxiety. Zola Button, Myrene Buddy R, DO Taking Active   atorvastatin (LIPITOR) 10 MG  tablet [id number] Yes TAKE ONE TABLET BY MOUTH EVERYDAY AT BEDTIME Donato Schultz, DO Taking Active   donepezil (ARICEPT ODT) 10 MG disintegrating tablet [id number] Yes Take 1 tablet (10 mg total) by mouth at bedtime. Donato Schultz, DO Taking Active   fluticasone-salmeterol (ADVAIR HFA) 403-47 MCG/ACT inhaler [id number] Yes Inhale 2 puffs into the lungs 2 (two) times daily.  Patient taking differently: Inhale 2 puffs into the lungs 2 (two) times daily. As needed   Donato Schultz, DO Taking Active   folic acid (FOLVITE) 1 MG tablet [id number] Yes Take 1 mg by mouth daily. [provider] Taking Active   levothyroxine (SYNTHROID) 88 MCG tablet [id number] Yes TAKE ONE TABLET BY MOUTH  BEFORE BREAKFAST Petro Talent, RPH-CPP Taking Active   methotrexate (RHEUMATREX) 2.5 MG tablet [id number] Yes Take 12.5 mg by mouth once a week. On Wednesdays [provider] Taking Active   Multiple Vitamins-Minerals (PRESERVISION AREDS 2 PO) [id number] Yes Take 1 tablet by mouth 2 (two) times daily. [provider] Taking Active             Patient Active Problem List   Diagnosis Date Noted   Fall 03/19/2021   Frequent urination 11/27/2020   COPD exacerbation (HCC) 11/27/2020   Aortic atherosclerosis (HCC) 08/12/2020   Oliguria 08/11/2020   Acute right-sided low back pain without sciatica 08/11/2020   Psoriasis 11/18/2019   Essential hypertension 02/12/2018   Generalized anxiety disorder 01/01/2018   Mild cognitive impairment 11/10/2017   Elevated LFTs 10/04/2017   Loss of weight 10/04/2017   Loose stools 10/04/2017   Weight loss, non-intentional 08/07/2017   Preventative health care 08/07/2017   Cerumen impaction 08/25/2015   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   Memory loss 08/25/2015   Diarrhea 12/19/2014   KNEE PAIN, LEFT 05/27/2010   DYSURIA 05/27/2010   POSTMENOPAUSAL STATUS 04/23/2010   CERVICAL POLYP 10/26/2009   HEMATURIA, HX OF 10/26/2009    DIZZINESS 06/11/2009   FOOT, PAIN 11/17/2008   OTHER ABNORMAL BLOOD CHEMISTRY 11/17/2008   TINEA CRURIS 09/15/2008   UTI 09/15/2008   COLONIC POLYPS, ADENOMATOUS, HX OF 01/16/2008   HEMOCCULT POSITIVE STOOL 12/11/2007   BREAST CANCER, HX OF 12/11/2007   Hypothyroidism 12/07/2007   Hyperlipidemia LDL goal <100 12/07/2007   COPD with chronic bronchitis (HCC) 12/07/2007   Disorder of bone and articular cartilage 12/07/2007   URI 03/13/2007   HX, URINARY INFECTION 01/09/2007    Immunization History  Administered Date(s) Administered   Fluad Quad(high Dose 65+) 03/11/2019, 03/23/2020, 02/02/2021   Influenza Split 03/15/2011   Influenza Whole 03/21/2007, 02/04/2008, 02/25/2009, 03/11/2010, 02/07/2012   Influenza, High Dose Seasonal PF 03/07/2014, 01/28/2016, 02/22/2017, 01/13/2018   Influenza,inj,Quad PF,6+ Mos 02/14/2013, 02/26/2015   PFIZER(Purple Top)SARS-COV-2 Vaccination 08/01/2019, 08/26/2019   Pneumococcal Conjugate-13 03/07/2014   Pneumococcal Polysaccharide-23 05/15/2006, 12/11/2007, 02/12/2018   Td 02/24/2004   Tdap 08/07/2017   Zoster, Live 11/02/2009    Conditions to be addressed/monitored: HLD, COPD, and aortic atherosclerosis,osteoporosis, mild cognitive impairment , psoriasis, hypothyroidism, ostoeporosis  Care Plan : General Pharmacy (Adult)  Updates made by Henrene Pastor, RPH-CPP since 05/27/2021 12:00 AM     Problem: pharmacy care plan for chronic condition monitoring and medication management   Priority: High     Long-Range Goal: Assist in management of chronic conditions (COPD, osteoporosis, heart disease, hypothyroidism and congnitive changes) and medications   Start Date: 07/28/2020  This Visit's Progress: On track  Priority: High  Note:   Current Barriers:  Unable to independently monitor therapeutic efficacy Does not adhere to prescribed medication regimen Education needed regarding use of maintenance and rescue inhalers  Pharmacist Clinical  Goal(s):  Over the next 90 days, patient will achieve control of cholesterol / hyperlipidemia as evidenced by LDL <100 maintain control of COPD and hyperlipidemia  as evidenced by attaining goals listed below  adhere to plan to optimize therapeutic regimen for COPD as evidenced by report of adherence to recommended medication management changes through collaboration with PharmD and provider.   Interventions: 1:1 collaboration with Zola Button, Grayling Congress, DO regarding development and update of comprehensive plan of care as evidenced by provider attestation and co-signature Inter-disciplinary care team collaboration (see longitudinal plan of care) Comprehensive medication review  performed; medication list updated in electronic medical record  Blood pressure monitoring At goal without need for pharmacotherapy;  BP goal <140/90 BP Readings from Last 3 Encounters:  04/15/21 121/66  03/19/21 124/74  03/10/21 125/69  Current regimen:  Diet and exercise management   Interventions: None today  Hyperlipidemia Lab Results  Component Value Date/Time   LDLCALC 97 11/27/2020 09:06 AM   LDLDIRECT 103.3 03/07/2014 11:23 AM  Controlled: LDL goal < 100 Current regimen:  Atorvastatin 10mg  daily Denies myalgias; history of increased LFTs but last LFT were WNL. Interventions: Recommend limiting intake of saturated and trans fat. Maintain cholesterol medication regimen.   COPD Goal: reduce symptoms associated with COPD Current regimen:  Advair HFA 115-47mcg / actuation -  Inhale 2 puffs into lungs twice daily Ventolin / albuterol - inhaler 2 puffs up to every 4 hours as needed for  shortness of breath / wheezing Rescue inhaler use - about once weekly Maintenance Inhaler use - per patient still only using as needed.  Had COPD exacerbation 03/2021 - ED visit Interventions: Discussed maintenance versus rescue inhaler.  Encouraged patient to use Advair HFA - two inhalations into lungs twice a day to  prevent exacerbations and hospitalizations Reminded to rinse / gargle after each use of Advair inhaler  Osteoporosis Goal: reduce risk of fracture due to osteoporosis  Current regimen:  Alendronate 70mg  weekly Interventions: Consider repeat DEXA (last was 10/06/2017) Maintain osteoporosis medication regimen  Health Maintenance:  Reviewed vaccination records - patient is UTD on vaccinations currently  Medication management Goal: Achieve optimal medication adherence Previously patient reported she missed donepezil about once every 2 weeks because she was falling asleep before she took her daily dose at bedtime. Discussed ways to improved adherence for donepezil at last visit. Adherence has improved with taking donepezil with evening meal instead of at bedtime. Current pharmacy: [facility] Comprehensive medication review performed. Reviewed adherence and refill history Utilize [facility] for medication synchronization and delivery Assisted with refill or methotrexate from pharmacy  Patient Goals/Self-Care Activities Over the next 90 days, patient will:  take medications as prescribed focus on medication adherence by using syncronized filling Continue to take donepezil with evening meal to increase adherence engage in dietary modifications by limiting trans and saturated fat intake.   Follow Up Plan: Telephone follow up appointment with clinical pharmacist scheduled for:  1 to 2 months to review medication adherence and assist with refills as needed           Medication Assistance: None required.  Patient affirms current coverage meets needs.  Patient's preferred pharmacy is:  [facility] - [place], [place] - [address] [place] [address] Phone: [phone] Fax: [phone]  [facility] [address] [place], [place] - [address] [place]  [address] Phone: [phone] Fax: [phone]    Pt endorses 95% compliance  Follow Up:  Patient agrees to Care Plan and Follow-up.  Plan: Telephone follow up appointment with care management team member scheduled for:  1 month  Henrene Pastor, PharmD Clinical Pharmacist [facility]

## 2021-06-08 DIAGNOSIS — Z87891 Personal history of nicotine dependence: Secondary | ICD-10-CM | POA: Diagnosis not present

## 2021-06-08 DIAGNOSIS — E785 Hyperlipidemia, unspecified: Secondary | ICD-10-CM | POA: Diagnosis not present

## 2021-06-08 DIAGNOSIS — M1990 Unspecified osteoarthritis, unspecified site: Secondary | ICD-10-CM | POA: Diagnosis not present

## 2021-06-08 DIAGNOSIS — E039 Hypothyroidism, unspecified: Secondary | ICD-10-CM | POA: Diagnosis not present

## 2021-06-08 DIAGNOSIS — J449 Chronic obstructive pulmonary disease, unspecified: Secondary | ICD-10-CM

## 2021-06-08 DIAGNOSIS — R69 Illness, unspecified: Secondary | ICD-10-CM | POA: Diagnosis not present

## 2021-06-08 DIAGNOSIS — F0280 Dementia in other diseases classified elsewhere without behavioral disturbance: Secondary | ICD-10-CM

## 2021-06-08 DIAGNOSIS — G309 Alzheimer's disease, unspecified: Secondary | ICD-10-CM

## 2021-06-10 ENCOUNTER — Ambulatory Visit (INDEPENDENT_AMBULATORY_CARE_PROVIDER_SITE_OTHER): Payer: Medicare HMO | Admitting: Family Medicine

## 2021-06-10 ENCOUNTER — Encounter: Payer: Self-pay | Admitting: Family Medicine

## 2021-06-10 VITALS — BP 110/88 | HR 57 | Temp 98.4°F | Resp 20 | Ht 64.0 in | Wt 158.2 lb

## 2021-06-10 DIAGNOSIS — J410 Simple chronic bronchitis: Secondary | ICD-10-CM

## 2021-06-10 DIAGNOSIS — H9193 Unspecified hearing loss, bilateral: Secondary | ICD-10-CM | POA: Diagnosis not present

## 2021-06-10 DIAGNOSIS — E039 Hypothyroidism, unspecified: Secondary | ICD-10-CM | POA: Diagnosis not present

## 2021-06-10 DIAGNOSIS — Z0000 Encounter for general adult medical examination without abnormal findings: Secondary | ICD-10-CM | POA: Diagnosis not present

## 2021-06-10 DIAGNOSIS — E785 Hyperlipidemia, unspecified: Secondary | ICD-10-CM | POA: Diagnosis not present

## 2021-06-10 LAB — CBC WITH DIFFERENTIAL/PLATELET
Basophils Absolute: 0.1 10*3/uL (ref 0.0–0.1)
Basophils Relative: 1.4 % (ref 0.0–3.0)
Eosinophils Absolute: 0.1 10*3/uL (ref 0.0–0.7)
Eosinophils Relative: 1.7 % (ref 0.0–5.0)
HCT: 41.8 % (ref 36.0–46.0)
Hemoglobin: 13.6 g/dL (ref 12.0–15.0)
Lymphocytes Relative: 19.5 % (ref 12.0–46.0)
Lymphs Abs: 1.5 10*3/uL (ref 0.7–4.0)
MCHC: 32.5 g/dL (ref 30.0–36.0)
MCV: 91.5 fl (ref 78.0–100.0)
Monocytes Absolute: 0.7 10*3/uL (ref 0.1–1.0)
Monocytes Relative: 9.2 % (ref 3.0–12.0)
Neutro Abs: 5.3 10*3/uL (ref 1.4–7.7)
Neutrophils Relative %: 68.2 % (ref 43.0–77.0)
Platelets: 253 10*3/uL (ref 150.0–400.0)
RBC: 4.57 Mil/uL (ref 3.87–5.11)
RDW: 15.3 % (ref 11.5–15.5)
WBC: 7.7 10*3/uL (ref 4.0–10.5)

## 2021-06-10 LAB — COMPREHENSIVE METABOLIC PANEL
ALT: 10 U/L (ref 0–35)
AST: 15 U/L (ref 0–37)
Albumin: 4.2 g/dL (ref 3.5–5.2)
Alkaline Phosphatase: 71 U/L (ref 39–117)
BUN: 14 mg/dL (ref 6–23)
CO2: 35 mEq/L — ABNORMAL HIGH (ref 19–32)
Calcium: 9.6 mg/dL (ref 8.4–10.5)
Chloride: 100 mEq/L (ref 96–112)
Creatinine, Ser: 0.76 mg/dL (ref 0.40–1.20)
GFR: 71.43 mL/min (ref >60.00)
Glucose, Bld: 96 mg/dL (ref 70–99)
Potassium: 4.3 mEq/L (ref 3.5–5.1)
Sodium: 141 mEq/L (ref 135–145)
Total Bilirubin: 0.7 mg/dL (ref 0.2–1.2)
Total Protein: 6.6 g/dL (ref 6.0–8.3)

## 2021-06-10 LAB — LIPID PANEL
Cholesterol: 210 mg/dL — ABNORMAL HIGH (ref 0–200)
HDL: 62.5 mg/dL (ref >39.00)
LDL Cholesterol: 120 mg/dL — ABNORMAL HIGH (ref 0–99)
NonHDL: 147.33
Total CHOL/HDL Ratio: 3
Triglycerides: 135 mg/dL (ref 0.0–149.0)
VLDL: 27 mg/dL (ref 0.0–40.0)

## 2021-06-10 LAB — TSH: TSH: 3.06 u[IU]/mL (ref 0.35–5.50)

## 2021-06-10 MED ORDER — ADVAIR HFA 115-21 MCG/ACT IN AERO: 2.0000 | INHALATION_SPRAY | Freq: Two times a day (BID) | RESPIRATORY_TRACT | 3 refills | Status: DC

## 2021-06-10 NOTE — Progress Notes (Signed)
Subjective:     Carrie Hall is a 88 y.o. female and is here for a comprehensive physical exam. The patient reports problems - worsening hearing  . She is also f/u on cholesterol and thyroid and needs a refill on her advair  Social History   Socioeconomic History   Marital status: Widowed    Spouse name: Not on file   Number of children: Not on file   Years of education: Not on file   Highest education level: Not on file  Occupational History   Not on file  Tobacco Use   Smoking status: Former    Packs/day: 0.50    Types: Cigarettes    Quit date: 12/06/2003    Years since quitting: 17.5   Smokeless tobacco: Never  Vaping Use   Vaping Use: Never used  Substance and Sexual Activity   Alcohol use: No   Drug use: No   Sexual activity: Never    Partners: Male  Other Topics Concern   Not on file  Social History Narrative   Pt lives alone in 1 story home   Has 2 sons that live nearby   9th grade education   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   Right handed    Social Determinants of Radio broadcast assistant Strain: Low Risk    Difficulty of Paying Living Expenses: Not hard at all  Food Insecurity: No Food Insecurity   Worried About Charity fundraiser in the Last Year: Never true   Arboriculturist in the Last Year: Never true  Transportation Needs: No Transportation Needs   Lack of Transportation (Medical): No   Lack of Transportation (Non-Medical): No  Physical Activity: Inactive   Days of Exercise per Week: 0 days   Minutes of Exercise per Session: 0 min  Stress: Not on file  Social Connections: Moderately Isolated   Frequency of Communication with Friends and Family: More than three times a week   Frequency of Social Gatherings with Friends and Family: More than three times a week   Attends Religious Services: More than 4 times per year   Active Member of [employer] or Organizations: No   Attends Archivist  Meetings: Never   Marital Status: Widowed  Human resources officer Violence: Not At Risk   Fear of Current or Ex-Partner: No   Emotionally Abused: No   Physically Abused: No   Sexually Abused: No   Health Maintenance  Topic Date Due   Zoster Vaccines- Shingrix (1 of 2) Never done   COVID-19 Vaccine (3 - Pfizer risk series) 06/26/2021 (Originally 09/23/2019)   MAMMOGRAM  06/10/2022 (Originally 03/13/2018)   TETANUS/TDAP  08/08/2027   Pneumonia Vaccine 44+ Years old  Completed   INFLUENZA VACCINE  Completed   DEXA SCAN  Completed   HPV VACCINES  Aged Out    The following portions of the patient's history were reviewed and updated as appropriate: She  has a past medical history of Adenomatous colon polyp, Breast cancer ([place]), COPD (chronic obstructive pulmonary disease) ([place]), Diverticulosis, Hyperlipidemia, Internal hemorrhoids, Osteopenia, and Thyroid disease. She does not have any pertinent problems on file. She  has a past surgical history that includes colon polyps; Breast surgery; and Breast lumpectomy (Left). Her family history includes Alzheimer's disease in her maternal grandmother and mother; Cancer in her father; Rectal cancer in her sister. She  reports that she quit smoking about 17 years ago. Her smoking use included cigarettes. She smoked an average of .5 packs per day. She has never used smokeless tobacco. She reports that she does not drink alcohol and does not use drugs. She has a current medication list which includes the following prescription(s): acetaminophen, albuterol, alendronate, alprazolam, atorvastatin, donepezil, folic acid, levothyroxine, methotrexate, multiple vitamins-minerals, and advair hfa. Current Outpatient Medications on File Prior to Visit  Medication Sig Dispense Refill   acetaminophen (TYLENOL) 325 MG tablet Take 650 mg by mouth every 8 (eight) hours as needed for moderate pain.     albuterol (VENTOLIN HFA) 108 (90 Base) MCG/ACT inhaler Inhale 2 puffs into  the lungs every 4 (four) hours as needed for wheezing or shortness of breath. 54 g 1   alendronate (FOSAMAX) 70 MG tablet Take 1 tablet (70 mg total) by mouth every 7 (seven) days. Take with a full glass of water on an empty stomach. 12 tablet 3   ALPRAZolam (XANAX) 0.25 MG tablet Take 1 tablet (0.25 mg total) by mouth at bedtime as needed for anxiety. 30 tablet 0   atorvastatin (LIPITOR) 10 MG tablet TAKE ONE TABLET BY MOUTH EVERYDAY AT BEDTIME 90 tablet 1   donepezil (ARICEPT ODT) 10 MG disintegrating tablet Take 1 tablet (10 mg total) by mouth at bedtime. 90 tablet 0   folic acid (FOLVITE) 1 MG tablet Take 1 mg by mouth daily.     levothyroxine (SYNTHROID) 88 MCG tablet TAKE ONE TABLET BY MOUTH BEFORE BREAKFAST 90 tablet 2   methotrexate (RHEUMATREX) 2.5 MG tablet Take 12.5 mg by mouth once a week. On Wednesdays     Multiple Vitamins-Minerals (PRESERVISION AREDS 2 PO) Take 1 tablet by mouth 2 (two) times daily.     No current facility-administered medications on file prior to visit.   She is allergic to pneumococcal vaccines..  Review of Systems Review of Systems  Constitutional: Negative for activity change, appetite change and fatigue.  HENT: Negative for , congestion, tinnitus and ear discharge.  dentist q23m----   + worsening hearing loss Eyes: Negative for visual disturbance (see optho q1y -- vision corrected to 20/20 with glasses).  Respiratory: Negative for cough, chest tightness and shortness of breath.   Cardiovascular: Negative for chest pain, palpitations and leg swelling.  Gastrointestinal: Negative for abdominal pain, diarrhea, constipation and abdominal distention.  Genitourinary: Negative for urgency, frequency, decreased urine volume and difficulty urinating.  Musculoskeletal: Negative for back pain, arthralgias and gait problem.  Skin: Negative for color change, pallor and rash.  Neurological: Negative for dizziness, light-headedness, numbness and headaches.   Hematological: Negative for adenopathy. Does not bruise/bleed easily.  Psychiatric/Behavioral: Negative for suicidal ideas, , sleep disturbance, self-injury, dysphoric mood, decreased concentration and agitation. ---- + memory loss     Objective:    BP 110/88 (BP Location: Left Arm, Patient Position: Sitting, Cuff Size: Normal)    Pulse (!) 57    Temp 98.4 F (36.9 C) (Oral)    Resp 20    Ht 5\' 4"  (1.626 m)    Wt 158 lb 3.2 oz (71.8 kg)    SpO2 95%    BMI 27.15 kg/m  General appearance: alert, cooperative, appears stated age, and no distress Head: Normocephalic, without obvious abnormality, atraumatic Eyes: negative findings: lids and lashes normal, conjunctivae and sclerae normal, and pupils equal, round, reactive to light and accomodation Ears: normal TM's and external ear canals both ears Nose: Nares normal. Septum midline. Mucosa normal. No  drainage or sinus tenderness. Throat: lips, mucosa, and tongue normal; teeth and gums normal Neck: no adenopathy, no carotid bruit, no JVD, supple, symmetrical, trachea midline, and thyroid not enlarged, symmetric, no tenderness/mass/nodules Lungs: clear to auscultation bilaterally Heart: regular rate and rhythm, S1, S2 normal, no murmur, click, rub or gallop Abdomen: soft, non-tender; bowel sounds normal; no masses,  no organomegaly Extremities: extremities normal, atraumatic, no cyanosis or edema Pulses: 2+ and symmetric Skin: Skin color, texture, turgor normal. No rashes or lesions Lymph nodes: Cervical, supraclavicular, and axillary nodes normal. Neurologic: Alert and oriented X 3, normal strength and tone. Normal symmetric reflexes. Normal coordination and gait    Assessment:    Healthy female exam.      Plan:    Ghm utd Check labs  See After Visit Summary for Counseling Recommendations   1. Hyperlipidemia, unspecified hyperlipidemia type Encourage heart healthy diet such as MIND or DASH diet, increase exercise, avoid trans fats,  simple carbohydrates and processed foods, consider a krill or fish or flaxseed oil cap daily.   - Lipid panel - Comprehensive metabolic panel - CBC with Differential/Platelet  2. Hypothyroidism, unspecified type Check labs today - TSH - CBC with Differential/Platelet  3. Preventative health care See above   4. Simple chronic bronchitis (HCC) Refill inhaler  - fluticasone-salmeterol (ADVAIR HFA) 115-21 MCG/ACT inhaler; Inhale 2 puffs into the lungs 2 (two) times daily.  Dispense: 3 each; Refill: 3  5. Bilateral hearing loss, unspecified hearing loss type Refer for hearing aids  - Ambulatory referral to Audiology

## 2021-06-10 NOTE — Patient Instructions (Signed)
Preventive Care 65 Years and Older, Female °Preventive care refers to lifestyle choices and visits with your health care provider that can promote health and wellness. Preventive care visits are also called wellness exams. °What can I expect for my preventive care visit? °Counseling °Your health care provider may ask you questions about your: °Medical history, including: °Past medical problems. °Family medical history. °Pregnancy and menstrual history. °History of falls. °Current health, including: °Memory and ability to understand (cognition). °Emotional well-being. °Home life and relationship well-being. °Sexual activity and sexual health. °Lifestyle, including: °Alcohol, nicotine or tobacco, and drug use. °Access to firearms. °Diet, exercise, and sleep habits. °Work and work environment. °Sunscreen use. °Safety issues such as seatbelt and bike helmet use. °Physical exam °Your health care provider will check your: °Height and weight. These may be used to calculate your BMI (body mass index). BMI is a measurement that tells if you are at a healthy weight. °Waist circumference. This measures the distance around your waistline. This measurement also tells if you are at a healthy weight and may help predict your risk of certain diseases, such as type 2 diabetes and high blood pressure. °Heart rate and blood pressure. °Body temperature. °Skin for abnormal spots. °What immunizations do I need? °Vaccines are usually given at various ages, according to a schedule. Your health care provider will recommend vaccines for you based on your age, medical history, and lifestyle or other factors, such as travel or where you work. °What tests do I need? °Screening °Your health care provider may recommend screening tests for certain conditions. This may include: °Lipid and cholesterol levels. °Hepatitis C test. °Hepatitis B test. °HIV (human immunodeficiency virus) test. °STI (sexually transmitted infection) testing, if you are at  risk. °Lung cancer screening. °Colorectal cancer screening. °Diabetes screening. This is done by checking your blood sugar (glucose) after you have not eaten for a while (fasting). °Mammogram. Talk with your health care provider about how often you should have regular mammograms. °BRCA-related cancer screening. This may be done if you have a family history of breast, ovarian, tubal, or peritoneal cancers. °Bone density scan. This is done to screen for osteoporosis. °Talk with your health care provider about your test results, treatment options, and if necessary, the need for more tests. °Follow these instructions at home: °Eating and drinking ° °Eat a diet that includes fresh fruits and vegetables, whole grains, lean protein, and low-fat dairy products. Limit your intake of foods with high amounts of sugar, saturated fats, and salt. °Take vitamin and mineral supplements as recommended by your health care provider. °Do not drink alcohol if your health care provider tells you not to drink. °If you drink alcohol: °Limit how much you have to 0-1 drink a day. °Know how much alcohol is in your drink. In the [place] one drink equals one 12 oz bottle of beer (355 mL), one 5 oz glass of wine (148 mL), or one 1½ oz glass of hard liquor (44 mL). °Lifestyle °Brush your teeth every morning and night with fluoride toothpaste. Floss one time each day. °Exercise for at least 30 minutes 5 or more days each week. °Do not use any products that contain nicotine or tobacco. These products include cigarettes, chewing tobacco, and vaping devices, such as e-cigarettes. If you need help quitting, ask your health care provider. °Do not use drugs. °If you are sexually active, practice safe sex. Use a condom or other form of protection in order to prevent STIs. °Take aspirin only as told by your   health care provider. Make sure that you understand how much to take and what form to take. Work with your health care provider to find out whether it  is safe and beneficial for you to take aspirin daily. Ask your health care provider if you need to take a cholesterol-lowering medicine (statin). Find healthy ways to manage stress, such as: Meditation, yoga, or listening to music. Journaling. Talking to a trusted person. Spending time with friends and family. Minimize exposure to UV radiation to reduce your risk of skin cancer. Safety Always wear your seat belt while driving or riding in a vehicle. Do not drive: If you have been drinking alcohol. Do not ride with someone who has been drinking. When you are tired or distracted. While texting. If you have been using any mind-altering substances or drugs. Wear a helmet and other protective equipment during sports activities. If you have firearms in your house, make sure you follow all gun safety procedures. What's next? Visit your health care provider once a year for an annual wellness visit. Ask your health care provider how often you should have your eyes and teeth checked. Stay up to date on all vaccines. This information is not intended to replace advice given to you by your health care provider. Make sure you discuss any questions you have with your health care provider. Document Revised: 10/21/2020 Document Reviewed: 10/21/2020 Elsevier Patient Education  [place].

## 2021-06-13 ENCOUNTER — Other Ambulatory Visit: Payer: Self-pay | Admitting: Family Medicine

## 2021-06-13 DIAGNOSIS — E785 Hyperlipidemia, unspecified: Secondary | ICD-10-CM

## 2021-06-14 ENCOUNTER — Other Ambulatory Visit: Payer: Self-pay

## 2021-06-14 ENCOUNTER — Ambulatory Visit: Payer: Medicare HMO | Admitting: Physician Assistant

## 2021-06-14 ENCOUNTER — Encounter: Payer: Self-pay | Admitting: Physician Assistant

## 2021-06-14 VITALS — BP 135/87 | HR 78 | Resp 18 | Ht 64.0 in | Wt 159.0 lb

## 2021-06-14 DIAGNOSIS — F03A3 Unspecified dementia, mild, with mood disturbance: Secondary | ICD-10-CM

## 2021-06-14 DIAGNOSIS — R69 Illness, unspecified: Secondary | ICD-10-CM | POA: Diagnosis not present

## 2021-06-14 DIAGNOSIS — F01A3 Vascular dementia, mild, with mood disturbance: Secondary | ICD-10-CM | POA: Diagnosis not present

## 2021-06-14 DIAGNOSIS — M8580 Other specified disorders of bone density and structure, unspecified site: Secondary | ICD-10-CM

## 2021-06-14 MED ORDER — ALENDRONATE SODIUM 70 MG PO TABS: 70.0000 mg | ORAL_TABLET | ORAL | 3 refills | Status: DC

## 2021-06-14 MED ORDER — ATORVASTATIN CALCIUM 20 MG PO TABS: 20.0000 mg | ORAL_TABLET | Freq: Every day | ORAL | 1 refills | Status: DC

## 2021-06-14 NOTE — Progress Notes (Signed)
Assessment/Plan:    Mild Dementia with mood disturbance   This is a very pleasant 88 year old right-handed woman history of mild dementia, currently on donepezil 10 mg daily, doing very well without any new complaints, and tolerating the medication well.  MMSE today is 24/30, delayed recall 3/3  Discussed safety both in and out of the home.  Discussed the importance of regular daily schedule to maintain brain function.  Continue to monitor mood by PCP Stay active at least 30 minutes at least 3 times a week.  Naps should be scheduled and should be no longer than 60 minutes and should not occur after 2 PM.  Mediterranean diet is recommended  Continue donepezil 10 mg daily Side effects were discussed Follow up in 6 months   Case discussed with Dr. Delice Lesch who agrees with the plan     Subjective:   ED visits since last seen: UTI 08/24/20, Cough, 05/28/20 COPD exacerbation / [facility] admissions: none  Breigh CANDANCE BOHLMAN is a 88 y.o. RH female   with a history of hyperlipidemia, hypothyroidism, breast cancer, with mild dementia seen today in follow up for memory loss. This patient is accompanied in the office by her daughter in law Hilda Blades  who supplements the history.  Previous records as well as any outside records available were reviewed prior to todays visit.  She is currently on Donepezil 10 mg daily. Since her last visit, the patient feels her memory is stable from her prior visit.  She continues to live alone, and her son visits daily, eating dinner with her.  She continues to manage her own medications, denying missing any doses.  Her son manages the finances.  She does not drive.  She continues to cook, and denies leaving the stove on.  She is independent of dressing and bathing, there are no hygiene concerns.  No paranoia or hallucinations have been reported. She reports that sometimes she has more anxiety.  Her sleep is "okay ", she takes naps occasionally.  She denies any  headaches, dizziness, vision changes, focal numbness or tingling, weakness or falls, neck or back pain.  She has issues with urinary frequency and urgency, and is following up with her PCP in the near future.  She has decreased hearing, and has an appointment with the audiologist soon.   Labs from PCP 06/10/21     TSH 3.06 nl  CMP, CBC normal  Lipid 7/22  : LDL 105  mild elevation  UA neg  History on Initial Assessment 11/01/2017: This is an 88 year old right-handed woman with a history of hyperlipidemia, hypothyroidism, breast cancer, presenting for evaluation of worsening memory. When asked about her memory, she states she "forgets sometimes." Her daughter-in-law appears hesitant to provide additional information saying she does not want to hurt her feelings, but notes she repeats herself several times the same day. She lives alone, they report she cooks and cleans, no hygiene concerns. Her son has been managing bills for the past 5 years since the patient's husband passed away. She fills her own pillbox and states she forgets a dose once in a while. Her family does not monitor medications so they cannot confirm this. She notes word-finding difficulties. She does not drive. She denies misplacing things frequently or leaving the stove on. She seems more anxious and has prn Xanax, but does not take this regularly. She took a dose last night and this morning nervous about today's visit. Her maternal grandmother, several cousins, and mother had  Alzheimer's dementia. No history of significant head injuries or alcohol intake. She had an MMSE at PCP office in April 2017 which was 21/30 and was started on Donepezil but appears to have stopped it, she does not recall any side effects. Last MMSE at PCP office a few days ago was 25/30. She had an MRI brain without contrast in 08/2015 which I personally reviewed, no acute changes, there was mild to moderate chronic microvascular disease and mild diffuse atrophy.   She  denies any headaches, dizziness, vision changes, dysarthria/dysphagia, neck/back pain, focal numbness/tingling/weakness. She has constipation but would get diarrhea with spicy foods.   PREVIOUS MEDICATIONS:   CURRENT MEDICATIONS:  Outpatient Encounter Medications as of 06/14/2021  Medication Sig   acetaminophen (TYLENOL) 325 MG tablet Take 650 mg by mouth every 8 (eight) hours as needed for moderate pain.   albuterol (VENTOLIN HFA) 108 (90 Base) MCG/ACT inhaler Inhale 2 puffs into the lungs every 4 (four) hours as needed for wheezing or shortness of breath.   ALPRAZolam (XANAX) 0.25 MG tablet Take 1 tablet (0.25 mg total) by mouth at bedtime as needed for anxiety.   donepezil (ARICEPT ODT) 10 MG disintegrating tablet Take 1 tablet (10 mg total) by mouth at bedtime.   fluticasone-salmeterol (ADVAIR HFA) 115-21 MCG/ACT inhaler Inhale 2 puffs into the lungs 2 (two) times daily.   folic acid (FOLVITE) 1 MG tablet Take 1 mg by mouth daily.   levothyroxine (SYNTHROID) 88 MCG tablet TAKE ONE TABLET BY MOUTH BEFORE BREAKFAST   methotrexate (RHEUMATREX) 2.5 MG tablet Take 12.5 mg by mouth once a week. On Wednesdays   Multiple Vitamins-Minerals (PRESERVISION AREDS 2 PO) Take 1 tablet by mouth 2 (two) times daily.   [DISCONTINUED] alendronate (FOSAMAX) 70 MG tablet Take 1 tablet (70 mg total) by mouth every 7 (seven) days. Take with a full glass of water on an empty stomach.   [DISCONTINUED] atorvastatin (LIPITOR) 10 MG tablet TAKE ONE TABLET BY MOUTH EVERYDAY AT BEDTIME   No facility-administered encounter medications on file as of 06/14/2021.     Objective:     PHYSICAL EXAMINATION:    VITALS:   Vitals:   06/14/21 0911  BP: 135/87  Pulse: 78  Resp: 18  SpO2: 94%  Weight: 159 lb (72.1 kg)  Height: 5\' 4"  (1.626 m)     GEN:  The patient appears stated age and is in NAD. HEENT:  Normocephalic, atraumatic.   Neurological examination:  General: NAD, well-groomed, appears stated  age. Orientation: The patient is alert. Oriented to person, place and not to date  Cranial nerves: There is good facial symmetry.The speech is fluent and clear. No aphasia or dysarthria. Fund of knowledge is appropriate.  Recent and remote memory are intact.  Attention and concentration are normal able to name objects and repeat phrases.  Hearing is reduced to conversational tone.    Sensation: Sensation is intact to light touch throughout Motor: Strength is at least antigravity x4. Tremors: none  DTR's 2/4 in [facility] Cognitive Assessment  01/31/2019 06/22/2018  Visuospatial/ Executive (0/5) - 3  Naming (0/3) - 3  Attention: Read list of digits (0/2) 1 1  Attention: Read list of letters (0/1) 0 0  Attention: Serial 7 subtraction starting at 100 (0/3) 0 0  Language: Repeat phrase (0/2) 1 0  Language : Fluency (0/1) 0 0  Abstraction (0/2) 0 0  Delayed Recall (0/5) 3 0  Orientation (0/6) 4 6  Total - 13  Adjusted Score (based on education) - 14   MMSE - Mini Mental State Exam 06/14/2021 03/05/2020 09/02/2019  Orientation to time 5 4 3   Orientation to Place 4 4 4   Registration 3 3 3   Attention/ Calculation 0 2 0  Recall 3 3 3   Language- name 2 objects 2 2 2   Language- repeat 1 0 1  Language- follow 3 step command 3 3 2   Language- read & follow direction 1 1 1   Write a sentence 1 0 0  Copy design 1 1 1   Total score 24 23 20     No flowsheet data found.    Movement examination: Tone: There is normal tone in the UE/LE Abnormal movements:  no tremor.  No myoclonus.  No asterixis.   Coordination:  There is no decremation with RAM's. Normal finger to nose  Gait and Station: The patient has some difficulty arising out of a deep-seated chair without the use of the hands. She uses a cane. The patient's stride length is good.  Gait is cautious and mildly wide based  Total time spent on today's visit was 30 minutes, including both face-to-face time and nonface-to-face time. Time  included that spent on review of records (prior notes available to me/labs/imaging if pertinent), discussing treatment and goals, answering patient's questions and coordinating care.  Cc:  Carollee Herter, Alferd Apa, DO Sharene Butters, PA-C

## 2021-06-14 NOTE — Patient Instructions (Signed)
It was a pleasure to see you today at our office.  ? ?Recommendations: ? ?Follow up in  6 months ?Continue donepezil 10 mg daily. Side effects were discussed  ? ? ?RECOMMENDATIONS FOR ALL PATIENTS WITH MEMORY PROBLEMS: ?1. Continue to exercise (Recommend 30 minutes of walking everyday, or 3 hours every week) ?2. Increase social interactions - continue going to Church and enjoy social gatherings with friends and family ?3. Eat healthy, avoid fried foods and eat more fruits and vegetables ?4. Maintain adequate blood pressure, blood sugar, and blood cholesterol level. Reducing the risk of stroke and cardiovascular disease also helps promoting better memory. ?5. Avoid stressful situations. Live a simple life and avoid aggravations. Organize your time and prepare for the next day in anticipation. ?6. Sleep well, avoid any interruptions of sleep and avoid any distractions in the bedroom that may interfere with adequate sleep quality ?7. Avoid sugar, avoid sweets as there is a strong link between excessive sugar intake, diabetes, and cognitive impairment ?We discussed the Mediterranean diet, which has been shown to help patients reduce the risk of progressive memory disorders and reduces cardiovascular risk. This includes eating fish, eat fruits and green leafy vegetables, nuts like almonds and hazelnuts, walnuts, and also use olive oil. Avoid fast foods and fried foods as much as possible. Avoid sweets and sugar as sugar use has been linked to worsening of memory function. ? ?There is always a concern of gradual progression of memory problems. If this is the case, then we may need to adjust level of care according to patient needs. Support, both to the patient and caregiver, should then be put into place.  ? ? ?FALL PRECAUTIONS: Be cautious when walking. Scan the area for obstacles that may increase the risk of trips and falls. When getting up in the mornings, sit up at the edge of the bed for a few minutes before getting  out of bed. Consider elevating the bed at the head end to avoid drop of blood pressure when getting up. Walk always in a well-lit room (use night lights in the walls). Avoid area rugs or power cords from appliances in the middle of the walkways. Use a walker or a cane if necessary and consider physical therapy for balance exercise. Get your eyesight checked regularly. ? ?FINANCIAL OVERSIGHT: Supervision, especially oversight when making financial decisions or transactions is also recommended. ? ?HOME SAFETY: Consider the safety of the kitchen when operating appliances like stoves, microwave oven, and blender. Consider having supervision and share cooking responsibilities until no longer able to participate in those. Accidents with firearms and other hazards in the house should be identified and addressed as well. ? ? ?ABILITY TO BE LEFT ALONE: If patient is unable to contact 911 operator, consider using [employer]Line, or when the need is there, arrange for someone to stay with patients. Smoking is a fire hazard, consider supervision or cessation. Risk of wandering should be assessed by caregiver and if detected at any point, supervision and safe proof recommendations should be instituted. ? ?MEDICATION SUPERVISION: Inability to self-administer medication needs to be constantly addressed. Implement a mechanism to ensure safe administration of the medications. ? ?  ?

## 2021-07-01 ENCOUNTER — Ambulatory Visit (INDEPENDENT_AMBULATORY_CARE_PROVIDER_SITE_OTHER): Payer: Medicare HMO | Admitting: Pharmacist

## 2021-07-01 DIAGNOSIS — E039 Hypothyroidism, unspecified: Secondary | ICD-10-CM

## 2021-07-01 DIAGNOSIS — E785 Hyperlipidemia, unspecified: Secondary | ICD-10-CM

## 2021-07-01 DIAGNOSIS — F0280 Dementia in other diseases classified elsewhere without behavioral disturbance: Secondary | ICD-10-CM

## 2021-07-01 NOTE — Chronic Care Management (AMB) (Signed)
Chronic Care Management Pharmacy Note  07/01/2021 Name:  MAZIAH MORTIMORE MRN:  [id number] DOB:  1934/04/26   Subjective: MAHATHI WIECHMAN is an 88 y.o. year old female who is a primary patient of Donato Schultz, DO.  The CCM team was consulted for assistance with disease management and care coordination needs.    Engaged with patient by telephone for follow up visit in response to provider referral for pharmacy case management and/or care coordination services.   Consent to Services:  The patient was given information about Chronic Care Management services, agreed to services, and gave verbal consent prior to initiation of services.  Please see initial visit note for detailed documentation.   Patient Care Team: Zola Button, Grayling Congress, DO as PCP - General Karel Jarvis Lesle Chris, MD as Consulting Physician (Neurology) Hollar, Ronal Fear, MD as Referring Physician (Dermatology) Henrene Pastor, RPH-CPP (Pharmacist)  Recent Office Visit:  06/10/2021 - Fam Med (Dr Laury Axon Almeta Monas) Comprehensive PE. Labs checked. LDL elevated. Atorvastatin dose increased to 20mg  daily.  03/19/2021 - Fam Med (Dr Laury Axon) Follow up ED visits. No wheeing noted for still some cough. Still hoarse. Prescribed azithromycin x 5 days for acute bronchitis.   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 No med changes. F/U 6 months.   Recent hospital visit:  04/15/2021 - ED Visit at [facility]. Pain and swelling of her right anterior knee. Continues to have pain in area as well. Xray ordered - unremarkable. Recommended ACE wrap. provided with stretching exercises. 03/10/2021 - ED Visit at [facility] for fall. Pain in left buttock after fall in her bathroom. Patient desated to 84% with ambiluation in ED. Received albuterol neb in ER and xray ordered of chest and lumbar spine. No evidience of pneumonia or hip / spine fracture on xray. No more  O2 decats. Discharged home. No medicaton changes noted.  03/08/2021 - ED Visit at [facility] for SOB / COPD exacerbation. Given prednisone 60mg  in ED and DuoNeb treatment with improvement. Discharged with 4 day course of prednisone 40mg  and instructed to use Advair every day and albuterol as needed.   Objective:  Lab Results  Component Value Date   CREATININE 0.76 06/10/2021   CREATININE 0.67 03/08/2021   CREATININE 0.73 11/27/2020    Lab Results  Component Value Date   HGBA1C 6.1 07/20/2012   Last diabetic Eye exam: No results found for: HMDIABEYEEXA  Last diabetic Foot exam: No results found for: HMDIABFOOTEX      Component Value Date/Time   CHOL 210 (H) 06/10/2021 0952   TRIG 135.0 06/10/2021 0952   TRIG 89 02/27/2006 0859   HDL 62.50 06/10/2021 0952   CHOLHDL 3 06/10/2021 0952   VLDL 27.0 06/10/2021 0952   LDLCALC 120 (H) 06/10/2021 0952   LDLDIRECT 103.3 03/07/2014 1123    Hepatic Function Latest Ref Rng & Units 06/10/2021 11/27/2020 05/22/2020  Total Protein 6.0 - 8.3 g/dL 6.6 6.6 6.3  Albumin 3.5 - 5.2 g/dL 4.2 4.3 4.2  AST 0 - 37 U/L 15 15 22   ALT 0 - 35 U/L 10 11 17   Alk Phosphatase 39 - 117 U/L 71 84 71  Total Bilirubin 0.2 - 1.2 mg/dL 0.7 0.6 0.7  Bilirubin, Direct 0.0 - 0.3 mg/dL - - -    Lab Results  Component Value Date/Time   TSH 3.06 06/10/2021 09:52 AM   TSH 3.14 11/27/2020 09:06 AM  FREET4 1.1 12/11/2007 12:00 AM    CBC Latest Ref Rng & Units 06/10/2021 03/08/2021 11/27/2020  WBC 4.0 - 10.5 K/uL 7.7 9.2 6.3  Hemoglobin 12.0 - 15.0 g/dL 59.5 63.8 75.6  Hematocrit 36.0 - 46.0 % 41.8 40.3 43.9  Platelets 150.0 - 400.0 K/uL 253.0 258 240.0    Lab Results  Component Value Date/Time   VD25OH 50 04/22/2010 08:21 PM   VD25OH 34 06/11/2009 12:00 AM    Clinical ASCVD:  noted to have aortic athersclerosis The ASCVD Risk score (Arnett DK, et al., 2019) failed to calculate for the following reasons:   The 2019 ASCVD risk score is only valid for  ages 30 to 50     Social History   Tobacco Use  Smoking Status Former   Packs/day: 0.50   Types: Cigarettes   Quit date: 12/06/2003   Years since quitting: 17.5  Smokeless Tobacco Never   BP Readings from Last 3 Encounters:  06/14/21 135/87  06/10/21 110/88  04/15/21 121/66   Pulse Readings from Last 3 Encounters:  06/14/21 78  06/10/21 (!) 57  04/15/21 71   Wt Readings from Last 3 Encounters:  06/14/21 159 lb (72.1 kg)  06/10/21 158 lb 3.2 oz (71.8 kg)  04/15/21 147 lb (66.7 kg)    Assessment: Review of patient past medical history, allergies, medications, health status, including review of consultants reports, laboratory and other test data, was performed as part of comprehensive evaluation and provision of chronic care management services.   SDOH:  (Social Determinants of Health) assessments and interventions performed:     CCM Care Plan  Allergies  Allergen Reactions   Pneumococcal Vaccines     Medications Reviewed Today     Reviewed by Henrene Pastor, RPH-CPP (Pharmacist) on 07/01/21 at 1442  Med List Status: <None>   Medication Order Taking? Sig Documenting Provider Last Dose Status Informant  acetaminophen (TYLENOL) 325 MG tablet [id number] No Take 650 mg by mouth every 8 (eight) hours as needed for moderate pain.  Patient not taking: Reported on 07/01/2021   [provider] Not Taking Active   albuterol (VENTOLIN HFA) 108 (90 Base) MCG/ACT inhaler [id number] Yes Inhale 2 puffs into the lungs every 4 (four) hours as needed for wheezing or shortness of breath. Donato Schultz, DO Taking Active   alendronate (FOSAMAX) 70 MG tablet [id number] Yes Take 1 tablet (70 mg total) by mouth every 7 (seven) days. Take with a full glass of water on an empty stomach. Donato Schultz, DO Taking Active   ALPRAZolam Prudy Feeler) 0.25 MG tablet [id number] Yes Take 1 tablet (0.25 mg total) by mouth at bedtime as needed for anxiety. Donato Schultz, DO Taking  Active   atorvastatin (LIPITOR) 20 MG tablet [id number] Yes Take 1 tablet (20 mg total) by mouth daily. Donato Schultz, DO Taking Active   donepezil (ARICEPT ODT) 10 MG disintegrating tablet [id number] Yes Take 1 tablet (10 mg total) by mouth at bedtime. Donato Schultz, DO Taking Active   fluticasone-salmeterol (ADVAIR [facility]) 073-71 MCG/ACT inhaler [id number] Yes Inhale 2 puffs into the lungs 2 (two) times daily. Donato Schultz, DO Taking Active            Med Note Clydie Braun, Cindie Laroche Jul 01, 2021  2:33 PM) Patient only using as needed  folic acid (FOLVITE) 1 MG tablet [id number] Yes Take 1 mg by mouth daily. [provider] Taking Active   levothyroxine (  SYNTHROID) 88 MCG tablet [id number] Yes TAKE ONE TABLET BY MOUTH BEFORE BREAKFAST Reyhan Moronta, RPH-CPP Taking Active   methotrexate (RHEUMATREX) 2.5 MG tablet [id number] Yes Take 12.5 mg by mouth once a week. On Wednesdays [provider] Taking Active   Multiple Vitamins-Minerals (PRESERVISION AREDS 2 PO) [id number] Yes Take 1 tablet by mouth 2 (two) times daily. [provider] Taking Active             Patient Active Problem List   Diagnosis Date Noted   Fall 03/19/2021   Frequent urination 11/27/2020   COPD exacerbation (HCC) 11/27/2020   Aortic atherosclerosis (HCC) 08/12/2020   Oliguria 08/11/2020   Acute right-sided low back pain without sciatica 08/11/2020   Psoriasis 11/18/2019   Essential hypertension 02/12/2018   Generalized anxiety disorder 01/01/2018   Mild cognitive impairment 11/10/2017   Elevated LFTs 10/04/2017   Loss of weight 10/04/2017   Loose stools 10/04/2017   Weight loss, non-intentional 08/07/2017   Preventative health care 08/07/2017   Cerumen impaction 08/25/2015   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   Memory loss 08/25/2015   Diarrhea 12/19/2014   KNEE PAIN, LEFT 05/27/2010   DYSURIA 05/27/2010   POSTMENOPAUSAL STATUS 04/23/2010   CERVICAL POLYP  10/26/2009   HEMATURIA, HX OF 10/26/2009   DIZZINESS 06/11/2009   FOOT, PAIN 11/17/2008   OTHER ABNORMAL BLOOD CHEMISTRY 11/17/2008   TINEA CRURIS 09/15/2008   UTI 09/15/2008   COLONIC POLYPS, ADENOMATOUS, HX OF 01/16/2008   HEMOCCULT POSITIVE STOOL 12/11/2007   BREAST CANCER, HX OF 12/11/2007   Hypothyroidism 12/07/2007   Hyperlipidemia LDL goal <100 12/07/2007   COPD with chronic bronchitis (HCC) 12/07/2007   Disorder of bone and articular cartilage 12/07/2007   URI 03/13/2007   HX, URINARY INFECTION 01/09/2007    Immunization History  Administered Date(s) Administered   Fluad Quad(high Dose 65+) 03/11/2019, 03/23/2020, 02/02/2021   Influenza Split 03/15/2011   Influenza Whole 03/21/2007, 02/04/2008, 02/25/2009, 03/11/2010, 02/07/2012   Influenza, High Dose Seasonal PF 03/07/2014, 01/28/2016, 02/22/2017, 01/13/2018   Influenza,inj,Quad PF,6+ Mos 02/14/2013, 02/26/2015   PFIZER(Purple Top)SARS-COV-2 Vaccination 08/01/2019, 08/26/2019   Pneumococcal Conjugate-13 03/07/2014   Pneumococcal Polysaccharide-23 05/15/2006, 12/11/2007, 02/12/2018   Td 02/24/2004   Tdap 08/07/2017   Zoster Recombinat (Shingrix) 12/07/2020, 04/20/2021   Zoster, Live 11/02/2009    Conditions to be addressed/monitored: HLD, COPD, and aortic atherosclerosis,osteoporosis, mild cognitive impairment , psoriasis, hypothyroidism, ostoeporosis  Care Plan : General Pharmacy (Adult)  Updates made by Henrene Pastor, RPH-CPP since 07/01/2021 12:00 AM     Problem: pharmacy care plan for chronic condition monitoring and medication management   Priority: High     Long-Range Goal: Assist in management of chronic conditions (COPD, osteoporosis, heart disease, hypothyroidism and congnitive changes) and medications   Start Date: 07/28/2020  Recent Progress: On track  Priority: High  Note:   Current Barriers:  Unable to independently monitor therapeutic efficacy Does not adhere to prescribed medication  regimen Education needed regarding use of maintenance and rescue inhalers  Pharmacist Clinical Goal(s):  Over the next 90 days, patient will achieve control of cholesterol / hyperlipidemia as evidenced by LDL <100 maintain control of COPD and hyperlipidemia  as evidenced by attaining goals listed below  adhere to plan to optimize therapeutic regimen for COPD as evidenced by report of adherence to recommended medication management changes through collaboration with PharmD and provider.   Interventions: 1:1 collaboration with Zola Button, Grayling Congress, DO regarding development and update of comprehensive plan of care as evidenced  by provider attestation and co-signature Inter-disciplinary care team collaboration (see longitudinal plan of care) Comprehensive medication review performed; medication list updated in electronic medical record  Blood pressure monitoring At goal without need for pharmacotherapy;  BP goal <140/90 BP Readings from Last 3 Encounters:  06/14/21 135/87  06/10/21 110/88  04/15/21 121/66  Current regimen:  Diet and exercise management   Interventions: None today  Hyperlipidemia Not at goal; LDL goal < 100 Current regimen:  Atorvastatin 20mg  daily (dose increased 06/14/2021) Denies myalgias; history of increased LFTs but last LFT were WNL. Interventions: Recommend limiting intake of saturated and trans fat. Maintain cholesterol medication regimen.  Discussed adherence  COPD Goal: reduce symptoms associated with COPD Current regimen:  Advair HFA 115-6mcg / actuation -  Inhale 2 puffs into lungs twice daily Ventolin / albuterol - inhaler 2 puffs up to every 4 hours as needed for  shortness of breath / wheezing Rescue inhaler use - hasn't used in last month Maintenance Inhaler use - per patient still only using as needed.  Had COPD exacerbation 03/2021 - ED visit Interventions: Discussed maintenance versus rescue inhaler.  Encouraged patient to use Advair HFA -  two inhalations into lungs twice a day to prevent exacerbations and hospitalizations Reminded to rinse / gargle after each use of Advair inhaler  Osteoporosis Goal: reduce risk of fracture due to osteoporosis  Current regimen:  Alendronate 70mg  weekly Interventions: Consider repeat DEXA (last was 10/06/2017) Maintain osteoporosis medication regimen  Health Maintenance:  Reviewed vaccination records - patient is UTD on vaccinations currently Updated vaccine records for Shingrix (received 12/07/2020 and 04/20/2021)  Medication management Goal: Achieve optimal medication adherence Previously patient reported she misses medication doses about once every 3 weeks. Discussed ways to improved adherence. Patient is using medication container reminder and this has helped.  Discussed adherence packaging and patient declined.  Donepezil adherence has improved with taking donepezil with evening meal instead of at bedtime. Current pharmacy: [facility] Comprehensive medication review performed. Reviewed adherence and refill history Coordinated with pharmacy to refill methotrexate and levothyroxine Also requested that next atorvastatin refill be for 90 days per patient request.   Patient Goals/Self-Care Activities Over the next 90 days, patient will:  take medications as prescribed focus on medication adherence by using syncronized filling Continue to take donepezil with evening meal to increase adherence engage in dietary modifications by limiting trans and saturated fat intake.   Follow Up Plan: Telephone follow up appointment with clinical pharmacist scheduled for:  1 to 2 months to review medication adherence and assist with refills as needed           Medication Assistance: None required.  Patient affirms current coverage meets needs.  Patient's preferred pharmacy is:  [facility] - [place], [place] - [address]  [address] [place] [address] Phone: [phone] Fax: [phone]  [facility] [address] [place], [place] - [address] [place] [address] Phone: [phone] Fax: [phone]    Pt endorses 95% compliance  Follow Up:  Patient agrees to Care Plan and Follow-up.  Plan: Telephone follow up appointment with care management team member scheduled for:  1 to 2 month  Henrene Pastor, PharmD Clinical Pharmacist [facility]

## 2021-07-01 NOTE — Patient Instructions (Signed)
Mrs. Nofziger,  It was a pleasure speaking with you today.  I have attached a summary of our visit today and information about your health goals. (See below)  If you have any questions or concerns, please feel free to contact me either at the phone number below or with a MyChart message.   Keep up the good work!  Cherre Robins, PharmD Clinical Pharmacist [facility] [phone] (direct line)  [phone] (main office number)  Chronic Care Management CARE PLAN (updated 07/01/2021)   Blood pressure monitoring BP Readings from Last 3 Encounters:  06/14/21 135/87  06/10/21 110/88  04/15/21 121/66   Pharmacist Clinical Goal(s): Over the next 180 days, patient will work with PharmD and providers to maintain BP goal <140/90 Current regimen:  Diet and exercise management   Patient self care activities - Over the next 180 days, patient will: Maintain blood pressure <140/90  Hyperlipidemia Lab Results  Component Value Date/Time   LDLCALC 120 (H) 06/10/2021 09:52 AM   LDLDIRECT 103.3 03/07/2014 11:23 AM   Pharmacist Clinical Goal(s): Over the next 180 days, patient will work with PharmD and providers to achieve LDL goal < 100 Current regimen:  Atorvastatin 20mg  daily Interventions: Recommended limiting intake of saturated and trans fat (especially fatty meats like sausage and bacon) Patient self care activities - Over the next 180 days, patient will: Maintain cholesterol medication regimen.  Limit intake of saturated and trans fat (sausage and bacon)   COPD Pharmacist Clinical Goal(s) Over the next 180 days, patient will work with PharmD and providers to reduce symptoms associated with COPD Current regimen:  Advair HFA 115-77mcg / actuation -  Inhale 2 puffs into lungs twice daily Ventolin / albuterol - inhaler 2 puffs up to every 4 hours as needed for  shortness of breath / wheezing Interventions: Discussed maintenance versus rescue inhaler.   Patient to start using Advair HFA twice a day. Reminded to rinse / gargle after each use of Advair inhaler Patient self care activities - Over the next 180 days, patient will: Advair (purple inhaler) is your maintenance inhaler - use every day; 2 puffs into lungs twice a day Remember to rinse mouth / gargle with water after each use of Advair Albuterol / Ventolin (black or dark blue inhaler) is your rescue inhaler  - use when needed for shortness of breath and wheezing.   Osteoporosis Pharmacist Clinical Goal(s) Over the next 180 days, patient will work with PharmD and providers to reduce risk of fracture due to osteoporosis  Current regimen:  Alendronate 70mg  weekly Interventions: Consider repeat DEXA (last was 10/06/2017) Patient self care activities - Over the next 180 days, patient will: Maintain osteoporosis medication regimen Complete DEXA Scan  Medication management Goal: Achieve optimal medication adherence Current pharmacy: [facility] Comprehensive medication review performed. Reviewed adherence and refill history Assisted in reordering methotrexate and levothyroxine Patient self care activities - Over the next 180 days, patient will: Focus on medication adherence by filling and taking medications appropriately  Take medications as prescribed Report any questions or concerns to PharmD and/or provider(s) Continue to take donepezil with your evening meal instead of bedtime so you won't forget to take it.  Patient Goals/Self-Care Activities Over the next 90 days, patient will:  take medications as prescribed focus on medication adherence by using syncronized filling;  Continue to take donepezil with evening meal to increase adherence engage in dietary modifications by limiting trans and saturated fat intake  Patient verbalizes understanding of instructions  and care plan provided today and agrees to view in [place]. Active MyChart status confirmed with  patient.

## 2021-07-06 DIAGNOSIS — G309 Alzheimer's disease, unspecified: Secondary | ICD-10-CM | POA: Diagnosis not present

## 2021-07-06 DIAGNOSIS — E785 Hyperlipidemia, unspecified: Secondary | ICD-10-CM | POA: Diagnosis not present

## 2021-07-06 DIAGNOSIS — F0280 Dementia in other diseases classified elsewhere without behavioral disturbance: Secondary | ICD-10-CM | POA: Diagnosis not present

## 2021-07-06 DIAGNOSIS — E039 Hypothyroidism, unspecified: Secondary | ICD-10-CM

## 2021-07-06 DIAGNOSIS — R69 Illness, unspecified: Secondary | ICD-10-CM | POA: Diagnosis not present

## 2021-08-02 DIAGNOSIS — L408 Other psoriasis: Secondary | ICD-10-CM | POA: Diagnosis not present

## 2021-08-06 DIAGNOSIS — Z79899 Other long term (current) drug therapy: Secondary | ICD-10-CM | POA: Diagnosis not present

## 2021-08-06 DIAGNOSIS — L400 Psoriasis vulgaris: Secondary | ICD-10-CM | POA: Diagnosis not present

## 2021-08-10 ENCOUNTER — Other Ambulatory Visit: Payer: Self-pay | Admitting: Family Medicine

## 2021-08-10 ENCOUNTER — Encounter: Payer: Self-pay | Admitting: Family Medicine

## 2021-08-10 ENCOUNTER — Ambulatory Visit (INDEPENDENT_AMBULATORY_CARE_PROVIDER_SITE_OTHER): Payer: Medicare HMO | Admitting: Family Medicine

## 2021-08-10 VITALS — BP 118/88 | HR 74 | Temp 98.0°F | Resp 18 | Ht 64.0 in | Wt 152.2 lb

## 2021-08-10 DIAGNOSIS — F411 Generalized anxiety disorder: Secondary | ICD-10-CM

## 2021-08-10 DIAGNOSIS — R350 Frequency of micturition: Secondary | ICD-10-CM

## 2021-08-10 DIAGNOSIS — N390 Urinary tract infection, site not specified: Secondary | ICD-10-CM | POA: Diagnosis not present

## 2021-08-10 DIAGNOSIS — R69 Illness, unspecified: Secondary | ICD-10-CM | POA: Diagnosis not present

## 2021-08-10 LAB — POC URINALSYSI DIPSTICK (AUTOMATED)
Bilirubin, UA: NEGATIVE
Blood, UA: NEGATIVE
Glucose, UA: NEGATIVE
Ketones, UA: NEGATIVE
Nitrite, UA: POSITIVE
Protein, UA: NEGATIVE
Spec Grav, UA: 1.015 (ref 1.010–1.025)
Urobilinogen, UA: 0.2 E.U./dL
pH, UA: 6 (ref 5.0–8.0)

## 2021-08-10 MED ORDER — ALPRAZOLAM 0.25 MG PO TABS: 0.2500 mg | ORAL_TABLET | Freq: Every evening | ORAL | 0 refills | Status: DC | PRN

## 2021-08-10 MED ORDER — CEPHALEXIN 500 MG PO CAPS: 500.0000 mg | ORAL_CAPSULE | Freq: Two times a day (BID) | ORAL | 0 refills | Status: DC

## 2021-08-10 NOTE — Patient Instructions (Signed)
Urinary Tract Infection, Adult A urinary tract infection (UTI) is an infection of any part of the urinary tract. The urinary tract includes the kidneys, ureters, bladder, and urethra. These organs make, store, and get rid of urine in the body. An upper UTI affects the ureters and kidneys. A lower UTI affects the bladder and urethra. What are the causes? Most urinary tract infections are caused by bacteria in your genital area around your urethra, where urine leaves your body. These bacteria grow and cause inflammation of your urinary tract. What increases the risk? You are more likely to develop this condition if: You have a urinary catheter that stays in place. You are not able to control when you urinate or have a bowel movement (incontinence). You are female and you: Use a spermicide or diaphragm for birth control. Have low estrogen levels. Are pregnant. You have certain genes that increase your risk. You are sexually active. You take antibiotic medicines. You have a condition that causes your flow of urine to slow down, such as: An enlarged prostate, if you are female. Blockage in your urethra. A kidney stone. A nerve condition that affects your bladder control (neurogenic bladder). Not getting enough to drink, or not urinating often. You have certain medical conditions, such as: Diabetes. A weak disease-fighting system (immunesystem). Sickle cell disease. Gout. Spinal cord injury. What are the signs or symptoms? Symptoms of this condition include: Needing to urinate right away (urgency). Frequent urination. This may include small amounts of urine each time you urinate. Pain or burning with urination. Blood in the urine. Urine that smells bad or unusual. Trouble urinating. Cloudy urine. Vaginal discharge, if you are female. Pain in the abdomen or the lower back. You may also have: Vomiting or a decreased appetite. Confusion. Irritability or tiredness. A fever or  chills. Diarrhea. The first symptom in older adults may be confusion. In some cases, they may not have any symptoms until the infection has worsened. How is this diagnosed? This condition is diagnosed based on your medical history and a physical exam. You may also have other tests, including: Urine tests. Blood tests. Tests for STIs (sexually transmitted infections). If you have had more than one UTI, a cystoscopy or imaging studies may be done to determine the cause of the infections. How is this treated? Treatment for this condition includes: Antibiotic medicine. Over-the-counter medicines to treat discomfort. Drinking enough water to stay hydrated. If you have frequent infections or have other conditions such as a kidney stone, you may need to see a health care provider who specializes in the urinary tract (urologist). In rare cases, urinary tract infections can cause sepsis. Sepsis is a life-threatening condition that occurs when the body responds to an infection. Sepsis is treated in the hospital with IV antibiotics, fluids, and other medicines. Follow these instructions at home: Medicines Take over-the-counter and prescription medicines only as told by your health care provider. If you were prescribed an antibiotic medicine, take it as told by your health care provider. Do not stop using the antibiotic even if you start to feel better. General instructions Make sure you: Empty your bladder often and completely. Do not hold urine for long periods of time. Empty your bladder after sex. Wipe from front to back after urinating or having a bowel movement if you are female. Use each tissue only one time when you wipe. Drink enough fluid to keep your urine pale yellow. Keep all follow-up visits. This is important. Contact a health care provider   if: Your symptoms do not get better after 1-2 days. Your symptoms go away and then return. Get help right away if: You have severe pain in your  back or your lower abdomen. You have a fever or chills. You have nausea or vomiting. Summary A urinary tract infection (UTI) is an infection of any part of the urinary tract, which includes the kidneys, ureters, bladder, and urethra. Most urinary tract infections are caused by bacteria in your genital area. Treatment for this condition often includes antibiotic medicines. If you were prescribed an antibiotic medicine, take it as told by your health care provider. Do not stop using the antibiotic even if you start to feel better. Keep all follow-up visits. This is important. This information is not intended to replace advice given to you by your health care provider. Make sure you discuss any questions you have with your health care provider. Document Revised: 12/06/2019 Document Reviewed: 12/06/2019 Elsevier Patient Education  2022 [employer]  

## 2021-08-10 NOTE — Progress Notes (Signed)
? ?Subjective:  ? ?By signing my name below, I, Shehryar Baig, attest that this documentation has been prepared under the direction and in the presence of [name], DO  08/10/2021 ?   ? ? Patient ID: Carrie Hall, female    DOB: 12/01/1933, 88 y.o.   MRN: [id number] ? ?Chief Complaint  ?Patient presents with  ? Abdominal Pain  ?  Pt states sxs started Thursday and states having more pressure than pain and states having freq.  ? ? ?Abdominal Pain ?Associated symptoms include dysuria and frequency.  ?Patient is in today for a office visit.  ? ?She complains of pain while urinating, frequency since 08/05/2021. She is not taking any medication at this time. She denies having any fevers at this time.  ? ?She also complains of trouble sleeping at night. She is willing to start taking her 0.25 mg xanax to manage her symptoms. She is requesting a refill on it as well.  ? ? ?Past Medical History:  ?Diagnosis Date  ? Adenomatous colon polyp   ? Breast cancer ([place])   ? COPD (chronic obstructive pulmonary disease) ([place])   ? Diverticulosis   ? Hyperlipidemia   ? Internal hemorrhoids   ? Osteopenia   ? Thyroid disease   ? ? ?Past Surgical History:  ?Procedure Laterality Date  ? BREAST LUMPECTOMY Left   ? radiation only  ? BREAST SURGERY    ? colon polyps    ? ? ?Family History  ?Problem Relation Age of Onset  ? Alzheimer's disease Mother   ? Cancer Father   ?     lung  ? Alzheimer's disease Maternal Grandmother   ? Rectal cancer Sister   ?     in her 29s  ? ? ?Social History  ? ?Socioeconomic History  ? Marital status: Widowed  ?  Spouse name: Not on file  ? Number of children: Not on file  ? Years of education: Not on file  ? Highest education level: Not on file  ?Occupational History  ? Not on file  ?Tobacco Use  ? Smoking status: Former  ?  Packs/day: 0.50  ?  Types: Cigarettes  ?  Quit date: 12/06/2003  ?  Years since quitting: 17.6  ? Smokeless tobacco: Never  ?Vaping Use  ? Vaping Use: Never used  ?Substance  and Sexual Activity  ? Alcohol use: No  ? Drug use: No  ? Sexual activity: Never  ?  Partners: Male  ?Other Topics Concern  ? Not on file  ?Social History Narrative  ? Pt lives alone in 1 story home  ? Has 2 sons that live nearby  ? 9th grade education  ? 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 Right handed   ? ?Social Determinants of Health  ? ?Financial Resource Strain: Low Risk   ? Difficulty of Paying Living Expenses: Not hard at all  ?Food Insecurity: No Food Insecurity  ? Worried About Charity fundraiser in the Last Year: Never true  ? Ran Out of Food in the Last Year: Never true  ?Transportation Needs: No Transportation Needs  ? Lack of Transportation (Medical): No  ? Lack of Transportation (Non-Medical): No  ?Physical Activity: Inactive  ? Days of Exercise per Week: 0 days  ? Minutes of Exercise per Session: 0 min  ?Stress: Not on file  ?Social Connections: Moderately Isolated  ? Frequency of Communication with Friends and Family: More than three times a  week  ? Frequency of Social Gatherings with Friends and Family: More than three times a week  ? Attends Religious Services: More than 4 times per year  ? Active Member of Clubs or Organizations: No  ? Attends Archivist Meetings: Never  ? Marital Status: Widowed  ?Intimate Partner Violence: Not At Risk  ? Fear of Current or Ex-Partner: No  ? Emotionally Abused: No  ? Physically Abused: No  ? Sexually Abused: No  ? ? ?Outpatient Medications Prior to Visit  ?Medication Sig Dispense Refill  ? albuterol (VENTOLIN HFA) 108 (90 Base) MCG/ACT inhaler Inhale 2 puffs into the lungs every 4 (four) hours as needed for wheezing or shortness of breath. 54 g 1  ? alendronate (FOSAMAX) 70 MG tablet Take 1 tablet (70 mg total) by mouth every 7 (seven) days. Take with a full glass of water on an empty stomach. 12 tablet 3  ? atorvastatin (LIPITOR) 20 MG tablet Take 1 tablet (20 mg total) by mouth daily. 90 tablet 1  ? donepezil (ARICEPT ODT) 10 MG  disintegrating tablet Take 1 tablet (10 mg total) by mouth at bedtime. 90 tablet 0  ? fluticasone-salmeterol (ADVAIR HFA) 115-21 MCG/ACT inhaler Inhale 2 puffs into the lungs 2 (two) times daily. 3 each 3  ? folic acid (FOLVITE) 1 MG tablet Take 1 mg by mouth daily.    ? levothyroxine (SYNTHROID) 88 MCG tablet TAKE ONE TABLET BY MOUTH BEFORE BREAKFAST 90 tablet 2  ? methotrexate (RHEUMATREX) 2.5 MG tablet Take 12.5 mg by mouth once a week. On Wednesdays    ? Multiple Vitamins-Minerals (PRESERVISION AREDS 2 PO) Take 1 tablet by mouth 2 (two) times daily.    ? ALPRAZolam (XANAX) 0.25 MG tablet Take 1 tablet (0.25 mg total) by mouth at bedtime as needed for anxiety. 30 tablet 0  ? acetaminophen (TYLENOL) 325 MG tablet Take 650 mg by mouth every 8 (eight) hours as needed for moderate pain. (Patient not taking: Reported on 07/01/2021)    ? ?No facility-administered medications prior to visit.  ? ? ?Allergies  ?Allergen Reactions  ? Pneumococcal Vaccines   ? ? ?Review of Systems  ?Genitourinary:  Positive for dysuria and frequency.  ?Psychiatric/Behavioral:  The patient has insomnia.   ? ?   ?Objective:  ?  ?Physical Exam ?Constitutional:   ?   General: She is not in acute distress. ?   Appearance: Normal appearance. She is not ill-appearing.  ?HENT:  ?   Head: Normocephalic and atraumatic.  ?   Right Ear: External ear normal.  ?   Left Ear: External ear normal.  ?Eyes:  ?   Extraocular Movements: Extraocular movements intact.  ?   Pupils: Pupils are equal, round, and reactive to light.  ?Cardiovascular:  ?   Rate and Rhythm: Normal rate and regular rhythm.  ?   Heart sounds: Normal heart sounds. No murmur heard. ?  No gallop.  ?Pulmonary:  ?   Effort: Pulmonary effort is normal. No respiratory distress.  ?   Breath sounds: Normal breath sounds. No wheezing or rales.  ?Skin: ?   General: Skin is warm and dry.  ?Neurological:  ?   Mental Status: She is alert and oriented to person, place, and time.  ?Psychiatric:     ?    Judgment: Judgment normal.  ? ? ?BP 118/88 (BP Location: Left Arm, Patient Position: Sitting, Cuff Size: Normal)   Pulse 74   Temp 98 ?F (36.7 ?C) (Oral)  Resp 18   Ht '5\' 4"'$  (1.626 m)   Wt 152 lb 3.2 oz (69 kg)   SpO2 95%   BMI 26.13 kg/m?  ?Wt Readings from Last 3 Encounters:  ?08/10/21 152 lb 3.2 oz (69 kg)  ?06/14/21 159 lb (72.1 kg)  ?06/10/21 158 lb 3.2 oz (71.8 kg)  ? ? ?Diabetic Foot Exam - Simple   ?No data filed ?  ? ?Lab Results  ?Component Value Date  ? WBC 7.7 06/10/2021  ? HGB 13.6 06/10/2021  ? HCT 41.8 06/10/2021  ? PLT 253.0 06/10/2021  ? GLUCOSE 96 06/10/2021  ? CHOL 210 (H) 06/10/2021  ? TRIG 135.0 06/10/2021  ? HDL 62.50 06/10/2021  ? LDLDIRECT 103.3 03/07/2014  ? LDLCALC 120 (H) 06/10/2021  ? ALT 10 06/10/2021  ? AST 15 06/10/2021  ? NA 141 06/10/2021  ? K 4.3 06/10/2021  ? CL 100 06/10/2021  ? CREATININE 0.76 06/10/2021  ? BUN 14 06/10/2021  ? CO2 35 (H) 06/10/2021  ? TSH 3.06 06/10/2021  ? HGBA1C 6.1 07/20/2012  ? ? ?Lab Results  ?Component Value Date  ? TSH 3.06 06/10/2021  ? ?Lab Results  ?Component Value Date  ? WBC 7.7 06/10/2021  ? HGB 13.6 06/10/2021  ? HCT 41.8 06/10/2021  ? MCV 91.5 06/10/2021  ? PLT 253.0 06/10/2021  ? ?Lab Results  ?Component Value Date  ? NA 141 06/10/2021  ? K 4.3 06/10/2021  ? CO2 35 (H) 06/10/2021  ? GLUCOSE 96 06/10/2021  ? BUN 14 06/10/2021  ? CREATININE 0.76 06/10/2021  ? BILITOT 0.7 06/10/2021  ? ALKPHOS 71 06/10/2021  ? AST 15 06/10/2021  ? ALT 10 06/10/2021  ? PROT 6.6 06/10/2021  ? ALBUMIN 4.2 06/10/2021  ? CALCIUM 9.6 06/10/2021  ? ANIONGAP 9 03/08/2021  ? GFR 71.43 06/10/2021  ? ?Lab Results  ?Component Value Date  ? CHOL 210 (H) 06/10/2021  ? ?Lab Results  ?Component Value Date  ? HDL 62.50 06/10/2021  ? ?Lab Results  ?Component Value Date  ? LDLCALC 120 (H) 06/10/2021  ? ?Lab Results  ?Component Value Date  ? TRIG 135.0 06/10/2021  ? ?Lab Results  ?Component Value Date  ? CHOLHDL 3 06/10/2021  ? ?Lab Results  ?Component Value Date  ? HGBA1C 6.1  07/20/2012  ? ? ?   ?Assessment & Plan:  ? ?Problem List Items Addressed This Visit   ? ?  ? Unprioritized  ? Generalized anxiety disorder  ? Relevant Medications  ? ALPRAZolam (XANAX) 0.25 MG tablet  ? Urinary tract infe

## 2021-08-10 NOTE — Assessment & Plan Note (Signed)
abx per orders ? ?Culture pending  ?

## 2021-08-13 LAB — URINE CULTURE
MICRO NUMBER:: 13219993
SPECIMEN QUALITY:: ADEQUATE

## 2021-08-26 ENCOUNTER — Telehealth: Payer: Self-pay | Admitting: Family Medicine

## 2021-08-26 NOTE — Telephone Encounter (Signed)
Pt called stating that she received a letter from [facility] that  her crestor has been recalled and she has been feeling right since taking it. After reviewing her chart, it seems like she hasn't taken it since 2016 but after talking with her, she states that she started on it back in February. Please Advise. ?

## 2021-08-27 ENCOUNTER — Ambulatory Visit (INDEPENDENT_AMBULATORY_CARE_PROVIDER_SITE_OTHER): Payer: Medicare HMO | Admitting: Pharmacist

## 2021-08-27 DIAGNOSIS — F0280 Dementia in other diseases classified elsewhere without behavioral disturbance: Secondary | ICD-10-CM

## 2021-08-27 DIAGNOSIS — J449 Chronic obstructive pulmonary disease, unspecified: Secondary | ICD-10-CM

## 2021-08-27 DIAGNOSIS — M8580 Other specified disorders of bone density and structure, unspecified site: Secondary | ICD-10-CM

## 2021-08-27 DIAGNOSIS — E785 Hyperlipidemia, unspecified: Secondary | ICD-10-CM

## 2021-08-27 MED ORDER — DONEPEZIL HCL 10 MG PO TBDP: 10.0000 mg | ORAL_TABLET | Freq: Every day | ORAL | 1 refills | Status: DC

## 2021-08-27 NOTE — Chronic Care Management (AMB) (Signed)
? ? ?Chronic Care Management ?Pharmacy Note ? ?08/27/2021 ?Name:  Carrie Hall MRN:  [id number] DOB:  Sep 09, 1933 ? ?Summary:  ?Reviewed medication list with patient.  Also reviewed adherence / fill records. Patient is due to have alendronate, donepezil ODT and fluticasone/salmeterol inhaler filled. Coordinated with pharmacy for delivery for Monday, April 24th. Patient will only have enough donepezil tablets to last until  Sunday night.  ? ?Subjective: ?Carrie Hall is an 88 y.o. year old female who is a primary patient of Donato Schultz, DO.  The CCM team was consulted for assistance with disease management and care coordination needs.   ? ?Engaged with patient by telephone for follow up visit in response to provider referral for pharmacy case management and/or care coordination services.  ? ?Consent to Services:  ?The patient was given information about Chronic Care Management services, agreed to services, and gave verbal consent prior to initiation of services.  Please see initial visit note for detailed documentation.  ? ?Patient Care Team: ?Zola Button, Grayling Congress, DO as PCP - General ?Van Clines, MD as Consulting Physician (Neurology) ?Hollar, Ronal Fear, MD as Referring Physician (Dermatology) ?Henrene Pastor, RPH-CPP (Pharmacist) ? ?Recent Office Visit:  ?08/10/2021 - Fam Med (Dr Laury AxonAlmeta Monas) Acute visit for UTI. Prescribed cephalexin 500mg  bid.  ?06/10/2021 - [facility]am Med (Dr Laury Axon [facility]) Comprehensive PE. Labs checked. LDL elevated. Atorvastatin dose increased to 20mg  daily.  ?03/19/2021 - [facility]am Med (Dr Laury Axon) Follow up ED visits. No wheeing noted for still some cough. Still hoarse. Prescribed azithromycin x 5 days for acute bronchitis.  ? ?Recent Consults:  ?06/14/2021 - Neuro (Dr Gwynneth Munson [facility]) F/U mild vascular dementia with mood distrubance. No med changes. F/U 6 months.  ? ?Recent hospital visit:  ?04/15/2021 - ED Visit at [facility]. Pain and swelling of her right anterior knee.  Continues to have pain in area as well. Xray ordered - unremarkable. Recommended ACE wrap. provided with stretching exercises. ?03/10/2021 - ED Visit at [facility] for fall. Pain in left buttock after fall in her bathroom. Patient desated to 84% with ambiluation in ED. Received albuterol neb in ER and xray ordered of chest and lumbar spine. No evidience of pneumonia or hip / spine fracture on xray. No more O2 decats. Discharged home. No medicaton changes noted.  ?03/08/2021 - ED Visit at [facility] for SOB / COPD exacerbation. Given prednisone 60mg  in ED and DuoNeb treatment with improvement. Discharged with 4 day course of prednisone 40mg  and instructed to use Advair every day and albuterol as needed.  ? ?Objective: ? ?Lab Results  ?Component Value Date  ? CREATININE 0.76 06/10/2021  ? CREATININE 0.67 03/08/2021  ? CREATININE 0.73 11/27/2020  ? ? ?Lab Results  ?Component Value Date  ? HGBA1C 6.1 07/20/2012  ? ?Last diabetic Eye exam: No results found for: HMDIABEYEEXA  ?Last diabetic Foot exam: No results found for: HMDIABFOOTEX  ? ?   ?Component Value Date/Time  ? CHOL 210 (H) 06/10/2021 0865  ? TRIG 135.0 06/10/2021 0952  ? TRIG 89 02/27/2006 0859  ? HDL 62.50 06/10/2021 0952  ? CHOLHDL 3 06/10/2021 0952  ? VLDL 27.0 06/10/2021 0952  ? LDLCALC 120 (H) 06/10/2021 7846  ? LDLDIRECT 103.3 03/07/2014 1123  ? ? ? ?  Latest Ref Rng & Units 06/10/2021  ?  9:52 AM 11/27/2020  ?  9:06 AM 05/22/2020  ?  9:26 AM  ?Hepatic Function  ?Total Protein 6.0 - 8.3 g/dL  6.6   6.6   6.3    ?Albumin 3.5 - 5.2 g/dL 4.2   4.3   4.2    ?AST 0 - 37 U/L 15   15   22     ?ALT 0 - 35 U/L 10   11   17     ?Alk Phosphatase 39 - 117 U/L 71   84   71    ?Total Bilirubin 0.2 - 1.2 mg/dL 0.7   0.6   0.7    ? ? ?Lab Results  ?Component Value Date/Time  ? TSH 3.06 06/10/2021 09:52 AM  ? TSH 3.14 11/27/2020 09:06 AM  ? FREET4 1.1 12/11/2007 12:00 AM  ? ? ? ?  Latest Ref Rng & Units 06/10/2021  ?  9:52 AM 03/08/2021  ?  1:54 PM 11/27/2020   ?  9:06 AM  ?CBC  ?WBC 4.0 - 10.5 K/uL 7.7   9.2   6.3    ?Hemoglobin 12.0 - 15.0 g/dL 96.0   45.4   09.8    ?Hematocrit 36.0 - 46.0 % 41.8   40.3   43.9    ?Platelets 150.0 - 400.0 K/uL 253.0   258   240.0    ? ? ?Lab Results  ?Component Value Date/Time  ? VD25OH 50 04/22/2010 08:21 PM  ? VD25OH 34 06/11/2009 12:00 AM  ? ? ?Clinical ASCVD:  noted to have aortic athersclerosis ?The ASCVD Risk score (Arnett DK, et al., 2019) failed to calculate for the following reasons: ?  The 2019 ASCVD risk score is only valid for ages 48 to 36   ? ? ?Social History  ? ?Tobacco Use  ?Smoking Status Former  ? Packs/day: 0.50  ? Types: Cigarettes  ? Quit date: 12/06/2003  ? Years since quitting: 17.7  ?Smokeless Tobacco Never  ? ?BP Readings from Last 3 Encounters:  ?08/10/21 118/88  ?06/14/21 135/87  ?06/10/21 110/88  ? ?Pulse Readings from Last 3 Encounters:  ?08/10/21 74  ?06/14/21 78  ?06/10/21 (!) 57  ? ?Wt Readings from Last 3 Encounters:  ?08/10/21 152 lb 3.2 oz (69 kg)  ?06/14/21 159 lb (72.1 kg)  ?06/10/21 158 lb 3.2 oz (71.8 kg)  ? ? ?Assessment: Review of patient past medical history, allergies, medications, health status, including review of consultants reports, laboratory and other test data, was performed as part of comprehensive evaluation and provision of chronic care management services.  ? ?SDOH:  (Social Determinants of Health) assessments and interventions performed:  ? ? ? ?CCM Care Plan ? ?Allergies  ?Allergen Reactions  ? Pneumococcal Vaccines   ? ? ?Medications Reviewed Today   ? ? Reviewed by Henrene Pastor, RPH-CPP (Pharmacist) on 08/27/21 at 1351  Med List Status: <None>  ? ?Medication Order Taking? Sig Documenting Provider Last Dose Status Informant  ?acetaminophen (TYLENOL) 325 MG tablet [id number] Yes Take 650 mg by mouth every 8 (eight) hours as needed for moderate pain. [provider] Taking Active   ?albuterol (VENTOLIN HFA) 108 (90 Base) MCG/ACT inhaler [id number] Yes Inhale 2 puffs into the  lungs every 4 (four) hours as needed for wheezing or shortness of breath. Donato Schultz, DO Taking Active   ?alendronate (FOSAMAX) 70 MG tablet [id number] Yes Take 1 tablet (70 mg total) by mouth every 7 (seven) days. Take with a full glass of water on an empty stomach. Donato Schultz, DO Taking Active   ?ALPRAZolam (XANAX) 0.25 MG tablet [id number] Yes Take 1 tablet (0.25 mg total) by mouth  at bedtime as needed for anxiety. Donato Schultz, DO Taking Active   ?atorvastatin (LIPITOR) 20 MG tablet [id number] Yes Take 1 tablet (20 mg total) by mouth daily. Donato Schultz, DO Taking Active   ?donepezil (ARICEPT ODT) 10 MG disintegrating tablet [id number] Yes Take 1 tablet (10 mg total) by mouth at bedtime. Donato Schultz, DO Taking Active   ?fluticasone-salmeterol (ADVAIR HFA) 086-57 MCG/ACT inhaler [id number] Yes Inhale 2 puffs into the lungs 2 (two) times daily. Donato Schultz, DO Taking Active   ?         ?Med Note Clydie Braun, Cindie Laroche Jul 01, 2021  2:33 PM) Patient only using as needed  ?folic acid (FOLVITE) 1 MG tablet [id number] Yes Take 1 mg by mouth daily. [provider] Taking Active   ?levothyroxine (SYNTHROID) 88 MCG tablet [id number] Yes TAKE ONE TABLET BY MOUTH BEFORE BREAKFAST Gavon Majano, RPH-CPP Taking Active   ?methotrexate (RHEUMATREX) 2.5 MG tablet [id number] Yes Take 12.5 mg by mouth once a week. On Wednesdays [provider] Taking Active   ?Multiple Vitamins-Minerals (PRESERVISION AREDS 2 PO) [id number] Yes Take 1 tablet by mouth 2 (two) times daily. [provider] Taking Active   ? ?  ?  ? ?  ? ? ?Patient Active Problem List  ? Diagnosis Date Noted  ? Fall 03/19/2021  ? Frequent urination 11/27/2020  ? COPD exacerbation (HCC) 11/27/2020  ? Aortic atherosclerosis (HCC) 08/12/2020  ? Oliguria 08/11/2020  ? Acute right-sided low back pain without sciatica 08/11/2020  ? Psoriasis 11/18/2019  ? Essential hypertension 02/12/2018  ?  Generalized anxiety disorder 01/01/2018  ? Mild cognitive impairment 11/10/2017  ? Elevated LFTs 10/04/2017  ? Loss of weight 10/04/2017  ? Loose stools 10/04/2017  ? Weight loss, non-intentional 08/07/2017

## 2021-08-27 NOTE — Telephone Encounter (Signed)
Called Pt and Pt was Advised @ Rx  ?

## 2021-08-27 NOTE — Patient Instructions (Signed)
Carrie Hall ?It was a pleasure speaking with you  ?Below is a summary of your health goals and care plan ? ?If you have any questions or concerns, please feel free to contact me either at the phone number below or with a MyChart message.  ? ?Keep up the good work! ? ?Carrie Hall, PharmD ?Clinical Pharmacist ?[facility] ?[name] ?[phone] (direct line)  ?[phone] (main office number) ? ? ?CARE PLAN ? ? ?Blood pressure monitoring ?BP Readings from Last 3 Encounters:  ?08/10/21 118/88  ?06/14/21 135/87  ?06/10/21 110/88  ? ?Pharmacist Clinical Goal(s): ?Over the next 180 days, patient will work with PharmD and providers to maintain BP goal <140/90 ?Current regimen:  ?Diet and exercise management   ?Patient self care activities - Over the next 180 days, patient will: ?Maintain blood pressure <140/90 ? ?Hyperlipidemia ?Lab Results  ?Component Value Date/Time  ? LDLCALC 120 (H) 06/10/2021 09:52 AM  ? LDLDIRECT 103.3 03/07/2014 11:23 AM  ? ?Pharmacist Clinical Goal(s): ?Over the next 180 days, patient will work with PharmD and providers to achieve LDL goal < 100 ?Current regimen:  ?Atorvastatin '20mg'$  daily ?Interventions: ?Recommended limiting intake of saturated and trans fat (especially fatty meats like sausage and bacon) ?Patient self care activities - Over the next 180 days, patient will: ?Maintain cholesterol medication regimen.  ?Limit intake of saturated and trans fat (sausage and bacon) ? ? ?COPD ?Pharmacist Clinical Goal(s) ?Over the next 180 days, patient will work with PharmD and providers to reduce symptoms associated with COPD ?Current regimen:  ?Advair HFA 115-22mg / actuation -  Inhale 2 puffs into lungs twice daily ?Ventolin / albuterol - inhaler 2 puffs up to every 4 hours as needed for  shortness of breath / wheezing ?Interventions: ?Discussed maintenance versus rescue inhaler.  ?Patient to start using Advair HFA twice a day. ?Reminded to rinse / gargle after each use of  Advair inhaler ?Patient self care activities - Over the next 180 days, patient will: ?Advair (purple inhaler) is your maintenance inhaler - use every day; 2 puffs into lungs twice a day ?Remember to rinse mouth / gargle with water after each use of Advair ?Albuterol / Ventolin (black or dark blue inhaler) is your rescue inhaler  - use when needed for shortness of breath and wheezing.  ? ?Osteoporosis ?Pharmacist Clinical Goal(s) ?Over the next 180 days, patient will work with PharmD and providers to reduce risk of fracture due to osteoporosis  ?Current regimen:  ?Alendronate '70mg'$  weekly ?Interventions: ?Consider repeat DEXA (last was 10/06/2017) ?Patient self care activities - Over the next 180 days, patient will: ?Maintain osteoporosis medication regimen ?Complete DEXA Scan ? ?Medication management ?Goal: Achieve optimal medication adherence ?Current pharmacy: [facility] ?Interventions ?Comprehensive medication review performed. ?Reviewed adherence and refill history ?Assisted in reordering methotrexate and levothyroxine ?Patient self care activities - Over the next 180 days, patient will: ?Focus on medication adherence by filling and taking medications appropriately  ?Take medications as prescribed ?Report any questions or concerns to PharmD and/or provider(s) ?Continue to take donepezil with your evening meal instead of bedtime so you won't forget to take it. ? ?Patient Goals/Self-Care Activities ?Over the next 90 days, patient will:  ?take medications as prescribed ?focus on medication adherence by using syncronized filling;  ?Continue to take donepezil with evening meal to increase adherence ?engage in dietary modifications by limiting trans and saturated fat intake ? ?Patient verbalizes understanding of instructions and care plan provided today and agrees to view in [facility] Active  MyChart status confirmed with patient.    ?

## 2021-08-30 ENCOUNTER — Telehealth: Payer: Self-pay | Admitting: Physician Assistant

## 2021-08-30 NOTE — Telephone Encounter (Signed)
Patient called and states that she is having a reaction to the medication  donepezil she states that she is having leg cramps, loss of appetite serve diarrhea and loss of sleep, she wants to know how she can stop this medication please call   ?

## 2021-08-31 NOTE — Telephone Encounter (Signed)
Patient advised to call with report in a few weeks, to stay off meds.  ?

## 2021-09-05 DIAGNOSIS — E785 Hyperlipidemia, unspecified: Secondary | ICD-10-CM | POA: Diagnosis not present

## 2021-09-05 DIAGNOSIS — M818 Other osteoporosis without current pathological fracture: Secondary | ICD-10-CM

## 2021-09-05 DIAGNOSIS — J449 Chronic obstructive pulmonary disease, unspecified: Secondary | ICD-10-CM | POA: Diagnosis not present

## 2021-09-14 ENCOUNTER — Telehealth: Payer: Self-pay | Admitting: Physician Assistant

## 2021-09-14 NOTE — Telephone Encounter (Signed)
Pt called in stating she has been off of the donepezil for 2 weeks now and was told to call and give an update. She is improving. She hasn't had the cramps in her legs, her diarrhea is better, and her appetite is starting to come back.  ?

## 2021-09-23 ENCOUNTER — Other Ambulatory Visit: Payer: Self-pay | Admitting: Family Medicine

## 2021-10-11 ENCOUNTER — Telehealth: Payer: Self-pay | Admitting: Pharmacist

## 2021-10-11 NOTE — Chronic Care Management (AMB) (Signed)
Chronic Care Management Pharmacy Assistant   Name: Carrie Hall  MRN: [id number] DOB: [birth year]  Reason for Encounter: Disease State General  Recent office visits:  None noted  Recent consult visits:  None noted  Hospital visits:  None in previous 6 months  Medications: Outpatient Encounter Medications as of 10/11/2021  Medication Sig Note   acetaminophen (TYLENOL) 325 MG tablet Take 650 mg by mouth every 8 (eight) hours as needed for moderate pain.    albuterol (VENTOLIN HFA) 108 (90 Base) MCG/ACT inhaler Inhale 2 puffs into the lungs every 4 (four) hours as needed for wheezing or shortness of breath.    alendronate (FOSAMAX) 70 MG tablet Take 1 tablet (70 mg total) by mouth every 7 (seven) days. Take with a full glass of water on an empty stomach.    ALPRAZolam (XANAX) 0.25 MG tablet Take 1 tablet (0.25 mg total) by mouth at bedtime as needed for anxiety.    atorvastatin (LIPITOR) 20 MG tablet TAKE ONE TABLET BY MOUTH ONCE DAILY    donepezil (ARICEPT ODT) 10 MG disintegrating tablet Take 1 tablet (10 mg total) by mouth at bedtime.    fluticasone-salmeterol (ADVAIR HFA) 115-21 MCG/ACT inhaler Inhale 2 puffs into the lungs 2 (two) times daily. 07/01/2021: Patient only using as needed   folic acid (FOLVITE) 1 MG tablet Take 1 mg by mouth daily.    levothyroxine (SYNTHROID) 88 MCG tablet TAKE ONE TABLET BY MOUTH BEFORE BREAKFAST    methotrexate (RHEUMATREX) 2.5 MG tablet Take 12.5 mg by mouth once a week. On Wednesdays    Multiple Vitamins-Minerals (PRESERVISION AREDS 2 PO) Take 1 tablet by mouth 2 (two) times daily.    No facility-administered encounter medications on file as of 10/11/2021.   Contacted [facility] for General Review Call   Chart Review:  Have there been any documented new, changed, or discontinued medications since last visit? No (If yes, include name, dose, frequency, date) Has there been any documented recent hospitalizations or ED visits since last visit  with Clinical Pharmacist? No    Adherence Review:  Does the Clinical Pharmacist Assistant have access to adherence rates? Yes  Adherence rates for STAR metric medications (List medication(s)/day supply/ last 2 fill dates). See star list below  Does the patient have >5 day gap between last estimated fill dates for any of the above medications or other medication gaps? No    Disease State Questions:  Able to connect with Patient? Yes  Did patient have any problems with their health recently? No Note problems and Concerns:Patient states she had side effects to the donepezil 10 mg like diarrhea, leg cramps, she states she has called her neurologist office about this and they told her to stop taking the medication.   Have you had any admissions or emergency room visits or worsening of your condition(s) since last visit? No Details of ED visit, hospital visit and/or worsening condition(s):N/a  Have you had any visits with new specialists or providers since your last visit? No Explain:Patient states she has not seen new specialist.  Have you had any new health care problem(s) since your last visit? No New problem(s) reported:Patient states she has no new health problems.   Have you run out of any of your medications since you last spoke with clinical pharmacist? No What caused you to run out of your medications? Patient states she recently got her refills last week and is up to date on her medications and she still takes  her methotrexate every Wednesday.  Are there any medications you are not taking as prescribed? No What kept you from taking your medications as prescribed? N/a  Are you having any issues or side effects with your medications? Yes Note of issues or side effects: Patient states the donepezil medication has given her major side effects like diarrhea, loss of appetite and leg cramps.  Do you have any other health concerns or questions you want to discuss with your  Clinical Pharmacist before your next visit? No Note additional concerns and questions from Patient. Patient states there is nothing at this time.  Are there any health concerns that you feel we can do a better job addressing? No Note Patient's response. Patient states there is nothing at this time.  Are you having any problems with any of the following since the last visit: (select all that apply)  None  Details:N/a  12. Any falls since last visit? No  Details:N/a  13. Any increased or uncontrolled pain since last visit? No  Details:N/a  14. Next visit Type: telephone       Visit with:[name]        Date:11/18/21        Time:1:45pm  15. Additional Details? No      Star Rating Drugs: Atorvastatin 20 mg Last filled:07/05/21 90 DS Donepezil 10 mg Last filled:08/27/21 90 DS  Myriam Elta [place], [place]

## 2021-10-15 NOTE — Telephone Encounter (Signed)
Proportion of Days Covered for Atorvastatin Calcium  From 04/13/2021 to 10/09/2021       60  80  100% of total days covered (180/180 days) Recent Dispenses      09/27/2021 20 MG TABS (disp 90, 90d supply)  07/05/2021 20 MG TABS (disp 90, 90d supply)  06/14/2021 20 MG TABS (disp 30, 30d supply)  03/29/2021 10 MG TABS (disp 90, 90d supply)  01/11/2021 10 MG TABS (disp 90, 90d supply)         High Confidence   Fill data for this medication is likely complete. Other factors may still affect the accuracy of the score.    See above - reviewed recent refills for atorvastatin - patient has filled on time for 2023 so far.

## 2021-11-18 ENCOUNTER — Ambulatory Visit (INDEPENDENT_AMBULATORY_CARE_PROVIDER_SITE_OTHER): Payer: Medicare HMO | Admitting: Pharmacist

## 2021-11-18 DIAGNOSIS — J410 Simple chronic bronchitis: Secondary | ICD-10-CM

## 2021-11-18 DIAGNOSIS — M8580 Other specified disorders of bone density and structure, unspecified site: Secondary | ICD-10-CM

## 2021-11-18 DIAGNOSIS — E785 Hyperlipidemia, unspecified: Secondary | ICD-10-CM

## 2021-11-18 DIAGNOSIS — J449 Chronic obstructive pulmonary disease, unspecified: Secondary | ICD-10-CM

## 2021-11-18 MED ORDER — FLUTICASONE-SALMETEROL 115-21 MCG/ACT IN AERO: 2.0000 | INHALATION_SPRAY | Freq: Two times a day (BID) | RESPIRATORY_TRACT | 5 refills | Status: DC

## 2021-11-18 NOTE — Patient Instructions (Signed)
Ms. Lamia It was a pleasure speaking with you  Below is a summary of your health goals and care plan  Patient Goals/Self-Care Activities take medications as prescribed focus on medication adherence by using syncronized filling Continue to take donepezil with evening meal to increase adherence engage in dietary modifications by limiting trans and saturated fat intake.  DEXA - test to determine how thick / strong your bones are has been ordered by Dr Carollee Herter.   Patient to call to set up appointment for DEXA at [facility] [phone]  If you have any questions or concerns, please feel free to contact me either at the phone number below or with a MyChart message.   Keep up the good work!  Cherre Robins, PharmD Clinical Pharmacist [place] [facility] [place] [facility] [phone] (direct line)  [phone] (main office number)   The patient verbalized understanding of instructions, educational materials, and care plan provided today and agreed to receive a mailed copy of patient instructions, educational materials, and care plan.

## 2021-11-18 NOTE — Chronic Care Management (AMB) (Signed)
Chronic Care Management Pharmacy Note  11/18/2021 Name:  Carrie Hall MRN:  [id number] DOB:  August 03, 1933  Summary:  Reviewed medication list with patient. Removed donepezil from med list - patient stopped 08/2021 due to diarrhea, decreased appetite and leg cramps. Has follow up with neuro 12/2021 Asthma - updated Rx for Advair at pharmacy. Patient has not needed resuce inhaler in the last 2 months. Using Advair 2 puffs twice a day as needed.  Osteoporosis - has been taking alendronate 70mg  weekly since 2019. Consider rechecking DEXA. Reminded patient she has order, just needs to scheduled. Provided imaging number.   Subjective: Carrie Hall is an 88 y.o. year old female who is a primary patient of Donato Schultz, DO.  The CCM team was consulted for assistance with disease management and care coordination needs.    Engaged with patient by telephone for follow up visit in response to provider referral for pharmacy case management and/or care coordination services.   Consent to Services:  The patient was given information about Chronic Care Management services, agreed to services, and gave verbal consent prior to initiation of services.  Please see initial visit note for detailed documentation.   Patient Care Team: Zola Button, Grayling Congress, DO as PCP - General Karel Jarvis Lesle Chris, MD as Consulting Physician (Neurology) Hollar, Ronal Fear, MD as Referring Physician (Dermatology) Henrene Pastor, RPH-CPP (Pharmacist)  Recent Office Visit:  08/10/2021 - Fam Med (Dr Laury AxonAlmeta Monas) Acute visit for UTI. Prescribed cephalexin 500mg  bid.  06/10/2021 - Fam Med (Dr Laury Axon Almeta Monas) Comprehensive PE. Labs checked. LDL elevated. Atorvastatin dose increased to 20mg  daily.   Recent Consults:  09/14/2021 - Phone Call to neurology. Symptoms of diarrhea, poor appetite and leg cramps has improved since off donepezil.  08/30/2021 - Phone Call to neurology. Patient has diarrhea and difficulty sleeping  with donepezil. Instructed to hold for a few weeks and then they will determine what other medication to potentially try.  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 No med changes. F/U 6 months.   Recent hospital visit:  None in last 6 months.   Objective:  Lab Results  Component Value Date   CREATININE 0.76 06/10/2021   CREATININE 0.67 03/08/2021   CREATININE 0.73 11/27/2020    Lab Results  Component Value Date   HGBA1C 6.1 07/20/2012   Last diabetic Eye exam: No results found for: "HMDIABEYEEXA"  Last diabetic Foot exam: No results found for: "HMDIABFOOTEX"      Component Value Date/Time   CHOL 210 (H) 06/10/2021 0952   TRIG 135.0 06/10/2021 0952   TRIG 89 02/27/2006 0859   HDL 62.50 06/10/2021 0952   CHOLHDL 3 06/10/2021 0952   VLDL 27.0 06/10/2021 0952   LDLCALC 120 (H) 06/10/2021 0952   LDLDIRECT 103.3 03/07/2014 1123       Latest Ref Rng & Units 06/10/2021    9:52 AM 11/27/2020    9:06 AM 05/22/2020    9:26 AM  Hepatic Function  Total Protein 6.0 - 8.3 g/dL 6.6  6.6  6.3   Albumin 3.5 - 5.2 g/dL 4.2  4.3  4.2   AST 0 - 37 U/L 15  15  22    ALT 0 - 35 U/L 10  11  17    Alk Phosphatase 39 - 117 U/L 71  84  71   Total Bilirubin 0.2 - 1.2 mg/dL 0.7  0.6  0.7     Lab Results  Component Value Date/Time   TSH 3.06 06/10/2021 09:52 AM   TSH 3.14 11/27/2020 09:06 AM   FREET4 1.1 12/11/2007 12:00 AM       Latest Ref Rng & Units 06/10/2021    9:52 AM 03/08/2021    1:54 PM 11/27/2020    9:06 AM  CBC  WBC 4.0 - 10.5 K/uL 7.7  9.2  6.3   Hemoglobin 12.0 - 15.0 g/dL 08.6  57.8  46.9   Hematocrit 36.0 - 46.0 % 41.8  40.3  43.9   Platelets 150.0 - 400.0 K/uL 253.0  258  240.0     Lab Results  Component Value Date/Time   VD25OH 50 04/22/2010 08:21 PM   VD25OH 34 06/11/2009 12:00 AM    Clinical ASCVD:  noted to have aortic athersclerosis The ASCVD Risk score (Arnett DK, et al., 2019) failed to calculate for the following  reasons:   The 2019 ASCVD risk score is only valid for ages 33 to 36     Social History   Tobacco Use  Smoking Status Former   Packs/day: 0.50   Types: Cigarettes   Quit date: 12/06/2003   Years since quitting: 17.9  Smokeless Tobacco Never   BP Readings from Last 3 Encounters:  08/10/21 118/88  06/14/21 135/87  06/10/21 110/88   Pulse Readings from Last 3 Encounters:  08/10/21 74  06/14/21 78  06/10/21 (!) 57   Wt Readings from Last 3 Encounters:  08/10/21 152 lb 3.2 oz (69 kg)  06/14/21 159 lb (72.1 kg)  06/10/21 158 lb 3.2 oz (71.8 kg)    Assessment: Review of patient past medical history, allergies, medications, health status, including review of consultants reports, laboratory and other test data, was performed as part of comprehensive evaluation and provision of chronic care management services.   SDOH:  (Social Determinants of Health) assessments and interventions performed:  SDOH Interventions    Flowsheet Row Most Recent Value  SDOH Interventions   Financial Strain Interventions Intervention Not Indicated  Transportation Interventions Intervention Not Indicated  [patient has family that assists with transportation]        CCM Care Plan  Allergies  Allergen Reactions   Pneumococcal Vaccines     Medications Reviewed Today     Reviewed by Henrene Pastor, RPH-CPP (Pharmacist) on 11/18/21 at 1402  Med List Status: <None>   Medication Order Taking? Sig Documenting Provider Last Dose Status Informant  acetaminophen (TYLENOL) 325 MG tablet [id number] Yes Take 650 mg by mouth every 8 (eight) hours as needed for moderate pain. [provider] Taking Active   albuterol (VENTOLIN HFA) 108 (90 Base) MCG/ACT inhaler [id number] Yes Inhale 2 puffs into the lungs every 4 (four) hours as needed for wheezing or shortness of breath. Donato Schultz, DO Taking Active   alendronate (FOSAMAX) 70 MG tablet [id number] Yes Take 1 tablet (70 mg total) by mouth every  7 (seven) days. Take with a full glass of water on an empty stomach. Seabron Spates R, DO Taking Active   ALPRAZolam Prudy Feeler) 0.25 MG tablet [id number] Yes Take 1 tablet (0.25 mg total) by mouth at bedtime as needed for anxiety. Zola Button, Myrene Buddy R, DO Taking Active   atorvastatin (LIPITOR) 20 MG tablet [id number] Yes TAKE ONE TABLET BY MOUTH ONCE DAILY Donato Schultz, DO Taking Active   fluticasone-salmeterol (ADVAIR [facility]) 115-21 MCG/ACT inhaler [id number] Yes Inhale 2 puffs into the lungs 2 (two) times daily. Donato Schultz, DO Taking Active  Med Note Clydie Braun, Cindie Laroche Jul 01, 2021  2:33 PM) Patient only using as needed  folic acid (FOLVITE) 1 MG tablet [id number] Yes Take 1 mg by mouth daily. [provider] Taking Active   levothyroxine (SYNTHROID) 88 MCG tablet [id number] Yes TAKE ONE TABLET BY MOUTH BEFORE BREAKFAST Veniamin Kincaid, RPH-CPP Taking Active   methotrexate (RHEUMATREX) 2.5 MG tablet [id number] Yes Take 12.5 mg by mouth once a week. On Wednesdays [provider] Taking Active   Multiple Vitamins-Minerals (PRESERVISION AREDS 2 PO) [id number] Yes Take 1 tablet by mouth 2 (two) times daily. [provider] Taking Active             Patient Active Problem List   Diagnosis Date Noted   Fall 03/19/2021   Frequent urination 11/27/2020   COPD exacerbation (HCC) 11/27/2020   Aortic atherosclerosis (HCC) 08/12/2020   Oliguria 08/11/2020   Acute right-sided low back pain without sciatica 08/11/2020   Psoriasis 11/18/2019   Essential hypertension 02/12/2018   Generalized anxiety disorder 01/01/2018   Mild cognitive impairment 11/10/2017   Elevated LFTs 10/04/2017   Loss of weight 10/04/2017   Loose stools 10/04/2017   Weight loss, non-intentional 08/07/2017   Preventative health care 08/07/2017   Cerumen impaction 08/25/2015   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   Memory loss 08/25/2015   Diarrhea 12/19/2014   KNEE PAIN,  LEFT 05/27/2010   DYSURIA 05/27/2010   POSTMENOPAUSAL STATUS 04/23/2010   CERVICAL POLYP 10/26/2009   HEMATURIA, HX OF 10/26/2009   DIZZINESS 06/11/2009   FOOT, PAIN 11/17/2008   OTHER ABNORMAL BLOOD CHEMISTRY 11/17/2008   TINEA CRURIS 09/15/2008   Urinary tract infection without hematuria 09/15/2008   COLONIC POLYPS, ADENOMATOUS, HX OF 01/16/2008   HEMOCCULT POSITIVE STOOL 12/11/2007   BREAST CANCER, HX OF 12/11/2007   Hypothyroidism 12/07/2007   Hyperlipidemia LDL goal <100 12/07/2007   COPD with chronic bronchitis (HCC) 12/07/2007   Disorder of bone and articular cartilage 12/07/2007   URI 03/13/2007   HX, URINARY INFECTION 01/09/2007    Immunization History  Administered Date(s) Administered   Fluad Quad(high Dose 65+) 03/11/2019, 03/23/2020, 02/02/2021   Influenza Split 03/15/2011   Influenza Whole 03/21/2007, 02/04/2008, 02/25/2009, 03/11/2010, 02/07/2012   Influenza, High Dose Seasonal PF 03/07/2014, 01/28/2016, 02/22/2017, 01/13/2018   Influenza,inj,Quad PF,6+ Mos 02/14/2013, 02/26/2015   PFIZER(Purple Top)SARS-COV-2 Vaccination 08/01/2019, 08/26/2019   Pneumococcal Conjugate-13 03/07/2014   Pneumococcal Polysaccharide-23 05/15/2006, 12/11/2007, 02/12/2018   Td 02/24/2004   Tdap 08/07/2017   Zoster Recombinat (Shingrix) 12/07/2020, 04/20/2021   Zoster, Live 11/02/2009    Conditions to be addressed/monitored: HLD, COPD, and aortic atherosclerosis,osteoporosis, mild cognitive impairment , psoriasis, hypothyroidism, ostoeporosis  Care Plan : General Pharmacy (Adult)  Updates made by Henrene Pastor, RPH-CPP since 11/18/2021 12:00 AM     Problem: pharmacy care plan for chronic condition monitoring and medication management   Priority: High     Long-Range Goal: Assist in management of chronic conditions (COPD, osteoporosis, heart disease, hypothyroidism and congnitive changes) and medications   Start Date: 07/28/2020  Recent Progress: On track  Priority: High  Note:    Current Barriers:  Unable to independently monitor therapeutic efficacy Does not adhere to prescribed medication regimen Education needed regarding use of maintenance and rescue inhalers  Pharmacist Clinical Goal(s):  Over the next 90 days, patient will achieve control of cholesterol / hyperlipidemia as evidenced by LDL <100 maintain control of COPD and hyperlipidemia  as evidenced by attaining goals listed below  adhere to  plan to optimize therapeutic regimen for COPD as evidenced by report of adherence to recommended medication management changes through collaboration with PharmD and provider.   Interventions: 1:1 collaboration with Zola Button, Grayling Congress, DO regarding development and update of comprehensive plan of care as evidenced by provider attestation and co-signature Inter-disciplinary care team collaboration (see longitudinal plan of care) Comprehensive medication review performed; medication list updated in electronic medical record  Blood pressure monitoring At goal without need for pharmacotherapy;  BP goal <140/90 BP Readings from Last 3 Encounters:  08/10/21 118/88  06/14/21 135/87  06/10/21 110/88  Current regimen:  Diet and exercise management   Interventions: None today  Hyperlipidemia Not at goal; LDL goal < 100 Lab Results  Component Value Date   CHOL 210 (H) 06/10/2021   HDL 62.50 06/10/2021   LDLCALC 120 (H) 06/10/2021   LDLDIRECT 103.3 03/07/2014   TRIG 135.0 06/10/2021   CHOLHDL 3 06/10/2021  Current regimen:  Atorvastatin 20mg  daily (dose increased 06/14/2021) Denies myalgias; history of increased LFTs but last LFT were WNL. Interventions: Recommend limiting intake of saturated and trans fat. Maintain cholesterol medication regimen.  Discussed adherence Due to recheck lipids in August 2023  COPD Goal: reduce symptoms associated with COPD Current regimen:  Advair HFA 115-54mcg / actuation -  Inhale 2 puffs into lungs twice daily Ventolin /  albuterol - inhaler 2 puffs up to every 4 hours as needed for  shortness of breath / wheezing Rescue inhaler use - hasn't used in last month Maintenance Inhaler use - per patient still only using as needed.  Had COPD exacerbation 03/2021 - ED visit Interventions: Discussed maintenance versus rescue inhaler.  Encouraged patient to use Advair HFA - two inhalations into lungs twice a day to prevent exacerbations and hospitalizations Reminded to rinse / gargle after each use of Advair inhaler  Osteoporosis Goal: reduce risk of fracture due to osteoporosis  Current regimen:  Alendronate 70mg  weekly Interventions: Consider repeat DEXA (last was 10/06/2017) Maintain osteoporosis medication regimen   Medication management Goal: Achieve optimal medication adherence Previously patient reported she misses medication doses about once every 3 weeks. Discussed ways to improved adherence. Patient is using medication container reminder and this has helped.  Discussed adherence packaging and patient declined.  Donepezil adherence has improved with taking donepezil with evening meal instead of at bedtime. Current pharmacy: [facility] Comprehensive medication review performed. Reviewed adherence and refill history Coordinated with pharmacy to refill methotrexate and levothyroxine Coordinate with pharmacy for needed refills   Patient Goals/Self-Care Activities Over the next 90 days, patient will:  take medications as prescribed focus on medication adherence by using syncronized filling Continue to take donepezil with evening meal to increase adherence engage in dietary modifications by limiting trans and saturated fat intake.   Follow Up Plan: Telephone follow up appointment with clinical pharmacist scheduled for:  1 to 2 months to review medication adherence and assist with refills as needed            Medication Assistance: None required.  Patient affirms current coverage  meets needs.  Patient's preferred pharmacy is:  [facility] - [place], [place] - [address] [place] [address] Phone: [phone] Fax: [phone]  [facility] [address] [place], [place] - [address] [place] [address] Phone: [phone] Fax: [phone]    Pt endorses 95% compliance  Follow Up:  Patient agrees to Care Plan and Follow-up.  Plan: Telephone follow up appointment with care management team member scheduled for:  1 to 2 month  Henrene Pastor, PharmD Clinical Pharmacist [facility]

## 2021-11-29 ENCOUNTER — Ambulatory Visit (INDEPENDENT_AMBULATORY_CARE_PROVIDER_SITE_OTHER): Payer: Medicare HMO

## 2021-11-29 VITALS — Ht 64.0 in | Wt 152.0 lb

## 2021-11-29 DIAGNOSIS — Z0000 Encounter for general adult medical examination without abnormal findings: Secondary | ICD-10-CM

## 2021-11-29 NOTE — Patient Instructions (Signed)
Carrie Hall , Thank you for taking time to complete your Medicare Wellness Visit. I appreciate your ongoing commitment to your health goals. Please review the following plan we discussed and let me know if I can assist you in the future.   Screening recommendations/referrals: Colonoscopy: No longer required Mammogram: Declined Bone Density: Declined Recommended yearly ophthalmology/optometry visit for glaucoma screening and checkup Recommended yearly dental visit for hygiene and checkup  Vaccinations: Influenza vaccine: Up to date Pneumococcal vaccine: Up to date Tdap vaccine: Up to date Shingles vaccine: Completed vaccines   Covid-19:Booster available at the pharmacy  Advanced directives: Please bring a copy of Living Will and/or Healthcare Power of Attorney for your chart.   Conditions/risks identified: See problem list  Next appointment: Follow up in one year for your annual wellness visit    Preventive Care 65 Years and Older, Female Preventive care refers to lifestyle choices and visits with your health care provider that can promote health and wellness. What does preventive care include? A yearly physical exam. This is also called an annual well check. Dental exams once or twice a year. Routine eye exams. Ask your health care provider how often you should have your eyes checked. Personal lifestyle choices, including: Daily care of your teeth and gums. Regular physical activity. Eating a healthy diet. Avoiding tobacco and drug use. Limiting alcohol use. Practicing safe sex. Taking low-dose aspirin every day. Taking vitamin and mineral supplements as recommended by your health care provider. What happens during an annual well check? The services and screenings done by your health care provider during your annual well check will depend on your age, overall health, lifestyle risk factors, and family history of disease. Counseling  Your health care provider may ask you  questions about your: Alcohol use. Tobacco use. Drug use. Emotional well-being. Home and relationship well-being. Sexual activity. Eating habits. History of falls. Memory and ability to understand (cognition). Work and work Statistician. Reproductive health. Screening  You may have the following tests or measurements: Height, weight, and BMI. Blood pressure. Lipid and cholesterol levels. These may be checked every 5 years, or more frequently if you are over 38 years old. Skin check. Lung cancer screening. You may have this screening every year starting at age 5 if you have a 30-pack-year history of smoking and currently smoke or have quit within the past 15 years. Fecal occult blood test (FOBT) of the stool. You may have this test every year starting at age 32. Flexible sigmoidoscopy or colonoscopy. You may have a sigmoidoscopy every 5 years or a colonoscopy every 10 years starting at age 46. Hepatitis C blood test. Hepatitis B blood test. Sexually transmitted disease (STD) testing. Diabetes screening. This is done by checking your blood sugar (glucose) after you have not eaten for a while (fasting). You may have this done every 1-3 years. Bone density scan. This is done to screen for osteoporosis. You may have this done starting at age 78. Mammogram. This may be done every 1-2 years. Talk to your health care provider about how often you should have regular mammograms. Talk with your health care provider about your test results, treatment options, and if necessary, the need for more tests. Vaccines  Your health care provider may recommend certain vaccines, such as: Influenza vaccine. This is recommended every year. Tetanus, diphtheria, and acellular pertussis (Tdap, Td) vaccine. You may need a Td booster every 10 years. Zoster vaccine. You may need this after age 42. Pneumococcal 13-valent conjugate (PCV13) vaccine. One  dose is recommended after age 42. Pneumococcal polysaccharide  (PPSV23) vaccine. One dose is recommended after age 40. Talk to your health care provider about which screenings and vaccines you need and how often you need them. This information is not intended to replace advice given to you by your health care provider. Make sure you discuss any questions you have with your health care provider. Document Released: 05/22/2015 Document Revised: 01/13/2016 Document Reviewed: 02/24/2015 Elsevier Interactive Patient Education  2017 [place] Prevention in the Home Falls can cause injuries. They can happen to people of all ages. There are many things you can do to make your home safe and to help prevent falls. What can I do on the outside of my home? Regularly fix the edges of walkways and driveways and fix any cracks. Remove anything that might make you trip as you walk through a door, such as a raised step or threshold. Trim any bushes or trees on the path to your home. Use bright outdoor lighting. Clear any walking paths of anything that might make someone trip, such as rocks or tools. Regularly check to see if handrails are loose or broken. Make sure that both sides of any steps have handrails. Any raised decks and porches should have guardrails on the edges. Have any leaves, snow, or ice cleared regularly. Use sand or salt on walking paths during winter. Clean up any spills in your garage right away. This includes oil or grease spills. What can I do in the bathroom? Use night lights. Install grab bars by the toilet and in the tub and shower. Do not use towel bars as grab bars. Use non-skid mats or decals in the tub or shower. If you need to sit down in the shower, use a plastic, non-slip stool. Keep the floor dry. Clean up any water that spills on the floor as soon as it happens. Remove soap buildup in the tub or shower regularly. Attach bath mats securely with double-sided non-slip rug tape. Do not have throw rugs and other things on the  floor that can make you trip. What can I do in the bedroom? Use night lights. Make sure that you have a light by your bed that is easy to reach. Do not use any sheets or blankets that are too big for your bed. They should not hang down onto the floor. Have a firm chair that has side arms. You can use this for support while you get dressed. Do not have throw rugs and other things on the floor that can make you trip. What can I do in the kitchen? Clean up any spills right away. Avoid walking on wet floors. Keep items that you use a lot in easy-to-reach places. If you need to reach something above you, use a strong step stool that has a grab bar. Keep electrical cords out of the way. Do not use floor polish or wax that makes floors slippery. If you must use wax, use non-skid floor wax. Do not have throw rugs and other things on the floor that can make you trip. What can I do with my stairs? Do not leave any items on the stairs. Make sure that there are handrails on both sides of the stairs and use them. Fix handrails that are broken or loose. Make sure that handrails are as long as the stairways. Check any carpeting to make sure that it is firmly attached to the stairs. Fix any carpet that is loose or worn.  Avoid having throw rugs at the top or bottom of the stairs. If you do have throw rugs, attach them to the floor with carpet tape. Make sure that you have a light switch at the top of the stairs and the bottom of the stairs. If you do not have them, ask someone to add them for you. What else can I do to help prevent falls? Wear shoes that: Do not have high heels. Have rubber bottoms. Are comfortable and fit you well. Are closed at the toe. Do not wear sandals. If you use a stepladder: Make sure that it is fully opened. Do not climb a closed stepladder. Make sure that both sides of the stepladder are locked into place. Ask someone to hold it for you, if possible. Clearly mark and make  sure that you can see: Any grab bars or handrails. First and last steps. Where the edge of each step is. Use tools that help you move around (mobility aids) if they are needed. These include: Canes. Walkers. Scooters. Crutches. Turn on the lights when you go into a dark area. Replace any light bulbs as soon as they burn out. Set up your furniture so you have a clear path. Avoid moving your furniture around. If any of your floors are uneven, fix them. If there are any pets around you, be aware of where they are. Review your medicines with your doctor. Some medicines can make you feel dizzy. This can increase your chance of falling. Ask your doctor what other things that you can do to help prevent falls. This information is not intended to replace advice given to you by your health care provider. Make sure you discuss any questions you have with your health care provider. Document Released: 02/19/2009 Document Revised: 10/01/2015 Document Reviewed: 05/30/2014 Elsevier Interactive Patient Education  2017 [employer].

## 2021-11-29 NOTE — Progress Notes (Signed)
Subjective:   Carrie Hall is a 88 y.o. female who presents for Medicare Annual (Subsequent) preventive examination.  I connected with [place] today by telephone and verified that I am speaking with the correct person using two identifiers. Location patient: home Location provider: work Persons participating in the virtual visit: patient, Marine scientist.    I discussed the limitations, risks, security and privacy concerns of performing an evaluation and management service by telephone and the availability of in person appointments. I also discussed with the patient that there may be a patient responsible charge related to this service. The patient expressed understanding and verbally consented to this telephonic visit.    Interactive audio and video telecommunications were attempted between this provider and patient, however failed, due to patient having technical difficulties OR patient did not have access to video capability.  We continued and completed visit with audio only.  Some vital signs may be absent or patient reported.   Time Spent with patient on telephone encounter: 30 minutes   Review of Systems     Cardiac Risk Factors include: advanced age (>55mn, >>37women);hypertension;dyslipidemia     Objective:    Today's Vitals   11/29/21 1100  Weight: 152 lb (68.9 kg)  Height: '5\' 4"'$  (1.626 m)   Body mass index is 26.09 kg/m.     11/29/2021   11:04 AM 06/14/2021    9:11 AM 03/10/2021    8:10 AM 03/08/2021    1:45 PM 12/07/2020    9:03 AM 11/26/2020    9:03 AM 08/24/2020    5:37 PM  Advanced Directives  Does Patient Have a Medical Advance Directive? Yes No No No No No No  Type of [place]        Copy of [place]? No - copy requested        Would patient like information on creating a medical advance directive?    No - Patient declined  Yes (MAU/Ambulatory/Procedural Areas - Information given) No -  Patient declined    Current Medications (verified) Outpatient Encounter Medications as of 11/29/2021  Medication Sig   acetaminophen (TYLENOL) 325 MG tablet Take 650 mg by mouth every 8 (eight) hours as needed for moderate pain.   albuterol (VENTOLIN HFA) 108 (90 Base) MCG/ACT inhaler Inhale 2 puffs into the lungs every 4 (four) hours as needed for wheezing or shortness of breath.   alendronate (FOSAMAX) 70 MG tablet Take 1 tablet (70 mg total) by mouth every 7 (seven) days. Take with a full glass of water on an empty stomach.   ALPRAZolam (XANAX) 0.25 MG tablet Take 1 tablet (0.25 mg total) by mouth at bedtime as needed for anxiety.   atorvastatin (LIPITOR) 20 MG tablet TAKE ONE TABLET BY MOUTH ONCE DAILY   fluticasone-salmeterol (ADVAIR HFA) 115-21 MCG/ACT inhaler Inhale 2 puffs into the lungs 2 (two) times daily. Remember to rinse mouth with water after each use of Advair inhaler.   folic acid (FOLVITE) 1 MG tablet Take 1 mg by mouth daily.   levothyroxine (SYNTHROID) 88 MCG tablet TAKE ONE TABLET BY MOUTH BEFORE BREAKFAST   methotrexate (RHEUMATREX) 2.5 MG tablet Take 12.5 mg by mouth once a week. On Wednesdays   Multiple Vitamins-Minerals (PRESERVISION AREDS 2 PO) Take 1 tablet by mouth 2 (two) times daily.   No facility-administered encounter medications on file as of 11/29/2021.    Allergies (verified) Pneumococcal vaccines   History: Past Medical History:  Diagnosis Date   Adenomatous colon polyp    Breast cancer ([place])    COPD (chronic obstructive pulmonary disease) ([place])    Diverticulosis    Hyperlipidemia    Internal hemorrhoids    Osteopenia    Thyroid disease    Past Surgical History:  Procedure Laterality Date   BREAST LUMPECTOMY Left    radiation only   BREAST SURGERY     colon polyps     Family History  Problem Relation Age of Onset   Alzheimer's disease Mother    Cancer Father        lung   Alzheimer's disease Maternal Grandmother    Rectal cancer Sister         in her 9s   Social History   Socioeconomic History   Marital status: Widowed    Spouse name: Not on file   Number of children: Not on file   Years of education: Not on file   Highest education level: Not on file  Occupational History   Not on file  Tobacco Use   Smoking status: Former    Packs/day: 0.50    Types: Cigarettes    Quit date: 12/06/2003    Years since quitting: 17.9   Smokeless tobacco: Never  Vaping Use   Vaping Use: Never used  Substance and Sexual Activity   Alcohol use: No   Drug use: No   Sexual activity: Never    Partners: Male  Other Topics Concern   Not on file  Social History Narrative   Pt lives alone in 1 story home   Has 2 sons that live nearby   9th grade education   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   Right handed    Social Determinants of Health   Financial Resource Strain: Low Risk  (11/18/2021)   Overall Financial Resource Strain (CARDIA)    Difficulty of Paying Living Expenses: Not hard at all  Food Insecurity: No [employer] (11/26/2020)   Hunger Vital Sign    Worried About Running Out of Food in the Last Year: Never true    [place] in the Last Year: Never true  Transportation Needs: No Transportation Needs (11/18/2021)   PRAPARE - Hydrologist (Medical): No    Lack of Transportation (Non-Medical): No  Physical Activity: Inactive (05/27/2021)   Exercise Vital Sign    Days of Exercise per Week: 0 days    Minutes of Exercise per Session: 0 min  Stress: Stress Concern Present (01/23/2020)   [place]    Feeling of Stress : To some extent  Social Connections: Moderately Isolated (11/18/2021)   Social Connection and Isolation Panel [NHANES]    Frequency of Communication with Friends and Family: Three times a week    Frequency of Social Gatherings with Friends and Family: Twice a week    Attends Religious Services:  More than 4 times per year    Active Member of [employer] or Organizations: No    Attends Archivist Meetings: Never    Marital Status: Widowed    Tobacco Counseling Counseling given: Not Answered   Clinical Intake:  Pre-visit preparation completed: Yes  Pain : No/denies pain     BMI - recorded: 26.09 Nutritional Status: BMI 25 -29 Overweight Nutritional Risks: None Diabetes: No  How often do you need to have someone help you when you read instructions, pamphlets, or other written  materials from your doctor or pharmacy?: 1 - Never  Diabetic?No  Interpreter Needed?: No  Information entered by :: Caroleen Hamman LPN   Activities of Daily Living    11/29/2021   11:08 AM 06/10/2021    9:30 AM  In your present state of health, do you have any difficulty performing the following activities:  Hearing? 1 1  Vision? 0 0  Difficulty concentrating or making decisions? 0 0  Walking or climbing stairs? 0 1  Dressing or bathing? 0 0  Doing errands, shopping? 1 0  Preparing Food and eating ? N   Using the Toilet? N   In the past six months, have you accidently leaked urine? Y   Do you have problems with loss of bowel control? N   Managing your Medications? N   Managing your Finances? N   Housekeeping or managing your Housekeeping? N     Patient Care Team: Carollee Herter, Alferd Apa, DO as PCP - General Delice Lesch Lezlie Octave, MD as Consulting Physician (Neurology) Hollar, Katharine Look, MD as Referring Physician (Dermatology) Cherre Robins, [place] (Pharmacist)  Indicate any recent Medical Services you may have received from other than [facility] providers in the past year (date may be approximate).     Assessment:   This is a routine wellness examination for Felicie.  Hearing/Vision screen Hearing Screening - Comments:: Pt states she does have some hearing loss Vision Screening - Comments:: Last eye exam-pt unsure of last date-Dr. Clydene Laming  Dietary issues and exercise activities  discussed: Current Exercise Habits: The patient does not participate in regular exercise at present, Exercise limited by: None identified   Goals Addressed               This Visit's Progress     Patient Stated     maintain independence and current health (pt-stated)   On track      Depression Screen    11/29/2021   11:08 AM 11/27/2020    8:21 AM 11/26/2020    9:06 AM 11/13/2019    2:03 PM 05/07/2019    8:58 AM 10/29/2018    8:12 AM 10/27/2017    8:18 AM  PHQ 2/9 Scores  PHQ - 2 Score 0 0 0 0 0 0 0    Fall Risk    11/29/2021   11:06 AM 06/14/2021    9:11 AM 06/10/2021    9:30 AM 12/07/2020    9:03 AM 11/27/2020    8:21 AM  [place] in the past year? 0 1 1 0 0  Number falls in past yr: 0 1 0 0 0  Injury with Fall? 0 0 1 0 0  Risk for fall due to :   Impaired balance/gait;Impaired mobility    Follow up Falls prevention discussed        FALL RISK PREVENTION PERTAINING TO THE HOME:  Any stairs in or around the home? No  Home free of loose throw rugs in walkways, pet beds, electrical cords, etc? Yes  Adequate lighting in your home to reduce risk of falls? Yes   ASSISTIVE DEVICES UTILIZED TO PREVENT FALLS:  Life alert? No  Use of a cane, walker or w/c? No  Grab bars in the bathroom? Yes  Shower chair or bench in shower? No  Elevated toilet seat or a handicapped toilet? No   TIMED UP AND GO:  Was the test performed? No . Phone visit   Cognitive Function: Patient has current diagnosis of cognitive impairment.  Patient is followed by neurology for ongoing assessment.      06/14/2021    8:00 PM 03/05/2020    8:00 AM 09/02/2019    8:00 AM 11/01/2017   10:00 AM 10/27/2017    8:19 AM  MMSE - Mini Mental State Exam  Orientation to time '5 4 3 5 4  '$ Orientation to Place '4 4 4 4 5  '$ Registration '3 3 3 3 3  '$ Attention/ Calculation 0 2 0 3 3  Recall '3 3 3 1 1  '$ Language- name 2 objects '2 2 2 2 2  '$ Language- repeat 1 0 '1 1 1  '$ Language- follow 3 step command '3 3 2 3 3   '$ Language- read & follow direction '1 1 1 1 1  '$ Write a sentence 1 0 0 1 1  Copy design '1 1 1 1 1  '$ Total score '24 23 20 25 25      '$ 01/31/2019    9:00 AM 06/22/2018   12:00 PM  Montreal Cognitive Assessment   Visuospatial/ Executive (0/5)  3  Naming (0/3)  3  Attention: Read list of digits (0/2) 1 1  Attention: Read list of letters (0/1) 0 0  Attention: Serial 7 subtraction starting at 100 (0/3) 0 0  Language: Repeat phrase (0/2) 1 0  Language : Fluency (0/1) 0 0  Abstraction (0/2) 0 0  Delayed Recall (0/5) 3 0  Orientation (0/6) 4 6  Total  13  Adjusted Score (based on education)  14      11/13/2019    1:59 PM  6CIT Screen  What Year? 0 points  What month? 0 points  What time? 0 points  Count back from 20 0 points  Repeat phrase 0 points    Immunizations Immunization History  Administered Date(s) Administered   Fluad Quad(high Dose 65+) 03/11/2019, 03/23/2020, 02/02/2021   Influenza Split 03/15/2011   Influenza Whole 03/21/2007, 02/04/2008, 02/25/2009, 03/11/2010, 02/07/2012   Influenza, High Dose Seasonal PF 03/07/2014, 01/28/2016, 02/22/2017, 01/13/2018   Influenza,inj,Quad PF,6+ Mos 02/14/2013, 02/26/2015   PFIZER(Purple Top)SARS-COV-2 Vaccination 08/01/2019, 08/26/2019   Pneumococcal Conjugate-13 03/07/2014   Pneumococcal Polysaccharide-23 05/15/2006, 12/11/2007, 02/12/2018   Td 02/24/2004   Tdap 08/07/2017   Zoster Recombinat (Shingrix) 12/07/2020, 04/20/2021   Zoster, Live 11/02/2009    TDAP status: Up to date  Flu Vaccine status: Up to date  Pneumococcal vaccine status: Up to date  Covid-19 vaccine status: Information provided on how to obtain vaccines.   Qualifies for Shingles Vaccine? No   Zostavax completed Yes   Shingrix Completed?: Yes  Screening Tests Health Maintenance  Topic Date Due   COVID-19 Vaccine (3 - Pfizer risk series) 09/23/2019   MAMMOGRAM  06/10/2022 (Originally 03/13/2018)   INFLUENZA VACCINE  12/07/2021   TETANUS/TDAP   08/08/2027   Pneumonia Vaccine 61+ Years old  Completed   DEXA SCAN  Completed   Zoster Vaccines- Shingrix  Completed   HPV VACCINES  Aged Out    Health Maintenance  Health Maintenance Due  Topic Date Due   COVID-19 Vaccine (3 - Pfizer risk series) 09/23/2019    Colorectal cancer screening: No longer required.   Mammogram status: No longer required due to patient declines.  Bone Density status: Declined  Lung Cancer Screening: (Low Dose CT Chest recommended if Age 64-80 years, 30 pack-year currently smoking OR have quit w/in 15years.) does not qualify.     Additional Screening:  Hepatitis C Screening: does not qualify  Vision Screening: Recommended annual ophthalmology exams for  early detection of glaucoma and other disorders of the eye. Is the patient up to date with their annual eye exam?  Yes  Who is the provider or what is the name of the office in which the patient attends annual eye exams? Dr. Clydene Laming  Dental Screening: Recommended annual dental exams for proper oral hygiene  Community Resource Referral / Chronic Care Management: CRR required this visit?  No   CCM required this visit?  No      Plan:     I have personally reviewed and noted the following in the patient's chart:   Medical and social history Use of alcohol, tobacco or illicit drugs  Current medications and supplements including opioid prescriptions.  Functional ability and status Nutritional status Physical activity Advanced directives List of other physicians Hospitalizations, surgeries, and ER visits in previous 12 months Vitals Screenings to include cognitive, depression, and falls Referrals and appointments  In addition, I have reviewed and discussed with patient certain preventive protocols, quality metrics, and best practice recommendations. A written personalized care plan for preventive services as well as general preventive health recommendations were provided to patient.   Due to  this being a telephonic visit, the after visit summary with patients personalized plan was offered to patient via mail or my-chart. Patient would like to access on my-chart.   Marta Antu, LPN   11/02/348  Nurse Health Advisor  Nurse Notes: None

## 2021-12-03 ENCOUNTER — Encounter: Payer: Self-pay | Admitting: Family Medicine

## 2021-12-06 DIAGNOSIS — J449 Chronic obstructive pulmonary disease, unspecified: Secondary | ICD-10-CM | POA: Diagnosis not present

## 2021-12-06 DIAGNOSIS — E785 Hyperlipidemia, unspecified: Secondary | ICD-10-CM

## 2021-12-06 DIAGNOSIS — J410 Simple chronic bronchitis: Secondary | ICD-10-CM | POA: Diagnosis not present

## 2021-12-09 ENCOUNTER — Ambulatory Visit (INDEPENDENT_AMBULATORY_CARE_PROVIDER_SITE_OTHER): Payer: Medicare HMO | Admitting: Family Medicine

## 2021-12-09 ENCOUNTER — Encounter: Payer: Self-pay | Admitting: Family Medicine

## 2021-12-09 VITALS — BP 118/80 | HR 58 | Temp 98.4°F | Resp 18 | Ht 64.0 in | Wt 148.0 lb

## 2021-12-09 DIAGNOSIS — R195 Other fecal abnormalities: Secondary | ICD-10-CM

## 2021-12-09 DIAGNOSIS — E785 Hyperlipidemia, unspecified: Secondary | ICD-10-CM | POA: Diagnosis not present

## 2021-12-09 DIAGNOSIS — I10 Essential (primary) hypertension: Secondary | ICD-10-CM

## 2021-12-09 DIAGNOSIS — E039 Hypothyroidism, unspecified: Secondary | ICD-10-CM | POA: Diagnosis not present

## 2021-12-09 DIAGNOSIS — R413 Other amnesia: Secondary | ICD-10-CM

## 2021-12-09 LAB — LIPID PANEL
Cholesterol: 180 mg/dL (ref 0–200)
HDL: 52.2 mg/dL (ref >39.00)
LDL Cholesterol: 97 mg/dL (ref 0–99)
NonHDL: 127.55
Total CHOL/HDL Ratio: 3
Triglycerides: 151 mg/dL — ABNORMAL HIGH (ref 0.0–149.0)
VLDL: 30.2 mg/dL (ref 0.0–40.0)

## 2021-12-09 LAB — COMPREHENSIVE METABOLIC PANEL
ALT: 12 U/L (ref 0–35)
AST: 15 U/L (ref 0–37)
Albumin: 4.2 g/dL (ref 3.5–5.2)
Alkaline Phosphatase: 75 U/L (ref 39–117)
BUN: 15 mg/dL (ref 6–23)
CO2: 34 mEq/L — ABNORMAL HIGH (ref 19–32)
Calcium: 9.5 mg/dL (ref 8.4–10.5)
Chloride: 100 mEq/L (ref 96–112)
Creatinine, Ser: 0.71 mg/dL (ref 0.40–1.20)
GFR: 77.24 mL/min (ref >60.00)
Glucose, Bld: 93 mg/dL (ref 70–99)
Potassium: 4.5 mEq/L (ref 3.5–5.1)
Sodium: 140 mEq/L (ref 135–145)
Total Bilirubin: 0.7 mg/dL (ref 0.2–1.2)
Total Protein: 6.6 g/dL (ref 6.0–8.3)

## 2021-12-09 LAB — CBC WITH DIFFERENTIAL/PLATELET
Basophils Absolute: 0.1 10*3/uL (ref 0.0–0.1)
Basophils Relative: 1.2 % (ref 0.0–3.0)
Eosinophils Absolute: 0.1 10*3/uL (ref 0.0–0.7)
Eosinophils Relative: 1.5 % (ref 0.0–5.0)
HCT: 42.7 % (ref 36.0–46.0)
Hemoglobin: 13.9 g/dL (ref 12.0–15.0)
Lymphocytes Relative: 20 % (ref 12.0–46.0)
Lymphs Abs: 1.3 10*3/uL (ref 0.7–4.0)
MCHC: 32.6 g/dL (ref 30.0–36.0)
MCV: 92.1 fl (ref 78.0–100.0)
Monocytes Absolute: 0.7 10*3/uL (ref 0.1–1.0)
Monocytes Relative: 10.8 % (ref 3.0–12.0)
Neutro Abs: 4.2 10*3/uL (ref 1.4–7.7)
Neutrophils Relative %: 66.5 % (ref 43.0–77.0)
Platelets: 252 10*3/uL (ref 150.0–400.0)
RBC: 4.64 Mil/uL (ref 3.87–5.11)
RDW: 14.9 % (ref 11.5–15.5)
WBC: 6.3 10*3/uL (ref 4.0–10.5)

## 2021-12-09 LAB — TSH: TSH: 2.6 u[IU]/mL (ref 0.35–5.50)

## 2021-12-09 NOTE — Assessment & Plan Note (Signed)
Encourage heart healthy diet such as MIND or DASH diet, increase exercise, avoid trans fats, simple carbohydrates and processed foods, consider a krill or fish or flaxseed oil cap daily.  °

## 2021-12-09 NOTE — Assessment & Plan Note (Signed)
Well controlled, no changes to meds. Encouraged heart healthy diet such as the DASH diet and exercise as tolerated.  °

## 2021-12-09 NOTE — Assessment & Plan Note (Signed)
Cont synthroid Check labs

## 2021-12-09 NOTE — Assessment & Plan Note (Signed)
Pt denies Pt refuses neuro f/u Encouraged pt to discuss with her sons

## 2021-12-09 NOTE — Telephone Encounter (Signed)
Noted  

## 2021-12-09 NOTE — Assessment & Plan Note (Signed)
Was due to aricept Med stopped Pt does not want to go back to neuro I encouraged her to discuss this with the son

## 2021-12-09 NOTE — Progress Notes (Signed)
Established Patient Office Visit  Subjective   Patient ID: Carrie Hall, female    DOB: 11/19/1933  Age: 88 y.o. MRN: [id number]  Chief Complaint  Patient presents with   Hyperlipidemia   Hypothyroidism   Follow-up    HPI Pt is   Patient Active Problem List   Diagnosis Date Noted   Fall 03/19/2021   Frequent urination 11/27/2020   COPD exacerbation ([place]) 11/27/2020   Aortic atherosclerosis ([place]) 08/12/2020   Oliguria 08/11/2020   Acute right-sided low back pain without sciatica 08/11/2020   Psoriasis 11/18/2019   Essential hypertension 02/12/2018   Generalized anxiety disorder 01/01/2018   Mild cognitive impairment 11/10/2017   Elevated LFTs 10/04/2017   Loss of weight 10/04/2017   Loose stools 10/04/2017   Weight loss, non-intentional 08/07/2017   Preventative health care 08/07/2017   Cerumen impaction 08/25/2015   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   Memory loss 08/25/2015   Diarrhea 12/19/2014   KNEE PAIN, LEFT 05/27/2010   DYSURIA 05/27/2010   POSTMENOPAUSAL STATUS 04/23/2010   CERVICAL POLYP 10/26/2009   HEMATURIA, HX OF 10/26/2009   DIZZINESS 06/11/2009   FOOT, PAIN 11/17/2008   OTHER ABNORMAL BLOOD CHEMISTRY 11/17/2008   TINEA CRURIS 09/15/2008   Urinary tract infection without hematuria 09/15/2008   COLONIC POLYPS, ADENOMATOUS, HX OF 01/16/2008   HEMOCCULT POSITIVE STOOL 12/11/2007   BREAST CANCER, HX OF 12/11/2007   Hypothyroidism 12/07/2007   Hyperlipidemia LDL goal <100 12/07/2007   COPD with chronic bronchitis ([place]) 12/07/2007   Disorder of bone and articular cartilage 12/07/2007   URI 03/13/2007   HX, URINARY INFECTION 01/09/2007   Past Medical History:  Diagnosis Date   Adenomatous colon polyp    Breast cancer ([place])    COPD (chronic obstructive pulmonary disease) ([place])    Diverticulosis    Hyperlipidemia    Internal hemorrhoids    Osteopenia    Thyroid disease    Past Surgical History:  Procedure Laterality Date   BREAST  LUMPECTOMY Left    radiation only   BREAST SURGERY     colon polyps     Social History   Tobacco Use   Smoking status: Former    Packs/day: 0.50    Types: Cigarettes    Quit date: 12/06/2003    Years since quitting: 18.0   Smokeless tobacco: Never  Vaping Use   Vaping Use: Never used  Substance Use Topics   Alcohol use: No   Drug use: No   Social History   Socioeconomic History   Marital status: Widowed    Spouse name: Not on file   Number of children: Not on file   Years of education: Not on file   Highest education level: Not on file  Occupational History   Not on file  Tobacco Use   Smoking status: Former    Packs/day: 0.50    Types: Cigarettes    Quit date: 12/06/2003    Years since quitting: 18.0   Smokeless tobacco: Never  Vaping Use   Vaping Use: Never used  Substance and Sexual Activity   Alcohol use: No   Drug use: No   Sexual activity: Never    Partners: Male  Other Topics Concern   Not on file  Social History Narrative   Pt lives alone in 1 story home   Has 2 sons that live nearby   9th grade education   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   Right handed  Social Determinants of Health   Financial Resource Strain: Low Risk  (11/18/2021)   Overall Financial Resource Strain (CARDIA)    Difficulty of Paying Living Expenses: Not hard at all  Food Insecurity: No Food Insecurity (11/26/2020)   Hunger Vital Sign    Worried About Running Out of Food in the Last Year: Never true    Ran Out of Food in the Last Year: Never true  Transportation Needs: No Transportation Needs (11/18/2021)   PRAPARE - Hydrologist (Medical): No    Lack of Transportation (Non-Medical): No  Physical Activity: Inactive (05/27/2021)   Exercise Vital Sign    Days of Exercise per Week: 0 days    Minutes of Exercise per Session: 0 min  Stress: Stress Concern Present (01/23/2020)   [place]    Feeling of Stress : To some extent  Social Connections: Moderately Isolated (11/18/2021)   Social Connection and Isolation Panel [NHANES]    Frequency of Communication with Friends and Family: Three times a week    Frequency of Social Gatherings with Friends and Family: Twice a week    Attends Religious Services: More than 4 times per year    Active Member of [employer] or Organizations: No    Attends Archivist Meetings: Never    Marital Status: Widowed  Intimate Partner Violence: Not At Risk (11/29/2021)   Humiliation, Afraid, Rape, and Kick questionnaire    Fear of Current or Ex-Partner: No    Emotionally Abused: No    Physically Abused: No    Sexually Abused: No   Family Status  Relation Name Status   Mother  Deceased   Father  Deceased   MGM  Deceased   Sister  Deceased   Family History  Problem Relation Age of Onset   Alzheimer's disease Mother    Cancer Father        lung   Alzheimer's disease Maternal Grandmother    Rectal cancer Sister        in her 80s   Allergies  Allergen Reactions   Pneumococcal Vaccines       Review of Systems  Constitutional:  Negative for fever and malaise/fatigue.  HENT:  Negative for congestion.   Eyes:  Negative for blurred vision.  Respiratory:  Negative for shortness of breath.   Cardiovascular:  Negative for chest pain, palpitations and leg swelling.  Gastrointestinal:  Negative for abdominal pain, blood in stool and nausea.  Genitourinary:  Negative for dysuria and frequency.  Musculoskeletal:  Negative for falls.  Skin:  Negative for rash.  Neurological:  Negative for dizziness, loss of consciousness and headaches.  Endo/Heme/Allergies:  Negative for environmental allergies.  Psychiatric/Behavioral:  Negative for depression. The patient is not nervous/anxious.       Objective:     BP 118/80 (BP Location: Right Arm, Patient Position: Sitting, Cuff Size: Normal)   Pulse (!) 58   Temp 98.4 F (36.9  C) (Oral)   Resp 18   Ht '5\' 4"'$  (1.626 m)   Wt 148 lb (67.1 kg)   SpO2 95%   BMI 25.40 kg/m  BP Readings from Last 3 Encounters:  12/09/21 118/80  08/10/21 118/88  06/14/21 135/87   Wt Readings from Last 3 Encounters:  12/09/21 148 lb (67.1 kg)  11/29/21 152 lb (68.9 kg)  08/10/21 152 lb 3.2 oz (69 kg)   SpO2 Readings from Last 3 Encounters:  12/09/21 95%  08/10/21 95%  06/14/21 94%      Physical Exam Vitals and nursing note reviewed.  Constitutional:      Appearance: She is well-developed.  HENT:     Head: Normocephalic and atraumatic.  Eyes:     Conjunctiva/sclera: Conjunctivae normal.  Neck:     Thyroid: No thyromegaly.     Vascular: No carotid bruit or JVD.  Cardiovascular:     Rate and Rhythm: Normal rate and regular rhythm.     Heart sounds: Normal heart sounds. No murmur heard. Pulmonary:     Effort: Pulmonary effort is normal. No respiratory distress.     Breath sounds: Normal breath sounds. No wheezing or rales.  Chest:     Chest wall: No tenderness.  Musculoskeletal:     Cervical back: Normal range of motion and neck supple.  Neurological:     Mental Status: She is alert and oriented to person, place, and time.      No results found for any visits on 12/09/21.  Last CBC Lab Results  Component Value Date   WBC 7.7 06/10/2021   HGB 13.6 06/10/2021   HCT 41.8 06/10/2021   MCV 91.5 06/10/2021   MCH 32.6 03/08/2021   RDW 15.3 06/10/2021   PLT 253.0 76/72/0947   Last metabolic panel Lab Results  Component Value Date   GLUCOSE 96 06/10/2021   NA 141 06/10/2021   K 4.3 06/10/2021   CL 100 06/10/2021   CO2 35 (H) 06/10/2021   BUN 14 06/10/2021   CREATININE 0.76 06/10/2021   GFRNONAA >60 03/08/2021   CALCIUM 9.6 06/10/2021   PROT 6.6 06/10/2021   ALBUMIN 4.2 06/10/2021   BILITOT 0.7 06/10/2021   ALKPHOS 71 06/10/2021   AST 15 06/10/2021   ALT 10 06/10/2021   ANIONGAP 9 03/08/2021   Last lipids Lab Results  Component Value Date    CHOL 210 (H) 06/10/2021   HDL 62.50 06/10/2021   LDLCALC 120 (H) 06/10/2021   LDLDIRECT 103.3 03/07/2014   TRIG 135.0 06/10/2021   CHOLHDL 3 06/10/2021   Last hemoglobin A1c Lab Results  Component Value Date   HGBA1C 6.1 07/20/2012   Last thyroid functions Lab Results  Component Value Date   TSH 3.06 06/10/2021   Last vitamin D Lab Results  Component Value Date   VD25OH 50 04/22/2010   Last vitamin B12 and Folate Lab Results  Component Value Date   VITAMINB12 277 08/25/2015   FOLATE 8.8 06/11/2009      The ASCVD Risk score (Arnett DK, et al., 2019) failed to calculate for the following reasons:   The 2019 ASCVD risk score is only valid for ages 92 to 72    Assessment & Plan:   Problem List Items Addressed This Visit       Unprioritized   Memory loss    Pt denies Pt refuses neuro f/u Encouraged pt to discuss with her sons       Loose stools    Was due to aricept Med stopped Pt does not want to go back to neuro I encouraged her to discuss this with the son       Hypothyroidism    Cont synthroid Check labs       Relevant Orders   TSH   Hyperlipidemia LDL goal <100 - Primary    Encourage heart healthy diet such as MIND or DASH diet, increase exercise, avoid trans fats, simple carbohydrates and processed foods, consider a krill or fish or flaxseed oil  cap daily.       Relevant Orders   Comprehensive metabolic panel   Lipid panel   Essential hypertension    Well controlled, no changes to meds. Encouraged heart healthy diet such as the DASH diet and exercise as tolerated.       Relevant Orders   Comprehensive metabolic panel   Lipid panel   CBC with Differential/Platelet   TSH    Return in about 6 months (around 06/11/2022), or if symptoms worsen or fail to improve, for fasting, annual exam.    [name], DO

## 2021-12-09 NOTE — Patient Instructions (Signed)

## 2021-12-14 ENCOUNTER — Ambulatory Visit: Payer: Medicare HMO | Admitting: Physician Assistant

## 2021-12-16 ENCOUNTER — Other Ambulatory Visit: Payer: Self-pay | Admitting: Pharmacist

## 2021-12-16 DIAGNOSIS — E039 Hypothyroidism, unspecified: Secondary | ICD-10-CM

## 2021-12-27 ENCOUNTER — Encounter: Payer: Self-pay | Admitting: Emergency Medicine

## 2021-12-27 ENCOUNTER — Ambulatory Visit
Admission: EM | Admit: 2021-12-27 | Discharge: 2021-12-27 | Disposition: A | Discharge status: Home / Self Care | Payer: Medicare HMO | Attending: Family Medicine | Admitting: Family Medicine

## 2021-12-27 DIAGNOSIS — H6123 Impacted cerumen, bilateral: Secondary | ICD-10-CM

## 2021-12-27 DIAGNOSIS — H6592 Unspecified nonsuppurative otitis media, left ear: Secondary | ICD-10-CM

## 2021-12-27 MED ORDER — CEFDINIR 300 MG PO CAPS: 300.0000 mg | ORAL_CAPSULE | Freq: Two times a day (BID) | ORAL | 0 refills | Status: AC

## 2021-12-27 NOTE — ED Triage Notes (Signed)
Patient c/o dizziness when she got up this morning.  Patient feels that it might be inner ear.  No nausea, balance is off.

## 2021-12-27 NOTE — Discharge Instructions (Addendum)
Advised patient to take medication as directed with food to completion.  Encouraged patient to increase daily water intake while taking this medication.  Advised patient once complete with antibiotics and may follow-up to clinic to reattempt to remove earwax from right ear canal.  Advised patient if symptoms worsen and/or unresolved please follow-up with PCP or here for further evaluation.

## 2021-12-27 NOTE — ED Provider Notes (Signed)
[facility]    CSN: [id number] Arrival date & time: 12/27/21  1112      History   Chief Complaint Chief Complaint  Patient presents with   Dizziness    HPI Carrie Hall is a 88 y.o. female.   HPI 88 year old female presents with dizziness when she got up this morning.  Patient feels that this may be in her ear.  Patient reports that her balance is off.  PMH significant for breast cancer, COPD, and osteopenia  Past Medical History:  Diagnosis Date   Adenomatous colon polyp    Breast cancer ([place])    COPD (chronic obstructive pulmonary disease) ([place])    Diverticulosis    Hyperlipidemia    Internal hemorrhoids    Osteopenia    Thyroid disease     Patient Active Problem List   Diagnosis Date Noted   Fall 03/19/2021   Frequent urination 11/27/2020   COPD exacerbation ([place]) 11/27/2020   Aortic atherosclerosis ([place]) 08/12/2020   Oliguria 08/11/2020   Acute right-sided low back pain without sciatica 08/11/2020   Psoriasis 11/18/2019   Essential hypertension 02/12/2018   Generalized anxiety disorder 01/01/2018   Mild cognitive impairment 11/10/2017   Elevated LFTs 10/04/2017   Loss of weight 10/04/2017   Loose stools 10/04/2017   Weight loss, non-intentional 08/07/2017   Preventative health care 08/07/2017   Cerumen impaction 08/25/2015   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   Memory loss 08/25/2015   Diarrhea 12/19/2014   KNEE PAIN, LEFT 05/27/2010   DYSURIA 05/27/2010   POSTMENOPAUSAL STATUS 04/23/2010   CERVICAL POLYP 10/26/2009   HEMATURIA, HX OF 10/26/2009   DIZZINESS 06/11/2009   FOOT, PAIN 11/17/2008   OTHER ABNORMAL BLOOD CHEMISTRY 11/17/2008   TINEA CRURIS 09/15/2008   Urinary tract infection without hematuria 09/15/2008   COLONIC POLYPS, ADENOMATOUS, HX OF 01/16/2008   HEMOCCULT POSITIVE STOOL 12/11/2007   BREAST CANCER, HX OF 12/11/2007   Hypothyroidism 12/07/2007   Hyperlipidemia LDL goal <100 12/07/2007   COPD with chronic  bronchitis ([place]) 12/07/2007   Disorder of bone and articular cartilage 12/07/2007   URI 03/13/2007   HX, URINARY INFECTION 01/09/2007    Past Surgical History:  Procedure Laterality Date   BREAST LUMPECTOMY Left    radiation only   BREAST SURGERY     colon polyps      OB History   No obstetric history on file.      Home Medications    Prior to Admission medications   Medication Sig Start Date End Date Taking? Authorizing Provider  acetaminophen (TYLENOL) 325 MG tablet Take 650 mg by mouth every 8 (eight) hours as needed for moderate pain.   Yes [provider]  albuterol (VENTOLIN HFA) 108 (90 Base) MCG/ACT inhaler Inhale 2 puffs into the lungs every 4 (four) hours as needed for wheezing or shortness of breath. 11/27/20  Yes Carollee Herter, Alferd Apa, DO  alendronate (FOSAMAX) 70 MG tablet Take 1 tablet (70 mg total) by mouth every 7 (seven) days. Take with a full glass of water on an empty stomach. 06/14/21  Yes [name], DO  ALPRAZolam (XANAX) 0.25 MG tablet Take 1 tablet (0.25 mg total) by mouth at bedtime as needed for anxiety. 08/10/21  Yes [name], DO  atorvastatin (LIPITOR) 20 MG tablet TAKE ONE TABLET BY MOUTH ONCE DAILY 09/23/21  Yes [name], DO  cefdinir (OMNICEF) 300 MG capsule Take 1 capsule (300 mg total) by mouth  2 (two) times daily for 7 days. 12/27/21 01/03/22 Yes Eliezer Lofts, FNP  fluticasone-salmeterol (ADVAIR HFA) 784-69 MCG/ACT inhaler Inhale 2 puffs into the lungs 2 (two) times daily. Remember to rinse mouth with water after each use of Advair inhaler. 11/18/21  Yes [name], DO  folic acid (FOLVITE) 1 MG tablet Take 1 mg by mouth daily.   Yes [provider]  levothyroxine (SYNTHROID) 88 MCG tablet Take 1 tablet (88 mcg total) by mouth daily before breakfast. 12/16/21  Yes Carollee Herter, Alferd Apa, DO  methotrexate (RHEUMATREX) 2.5 MG tablet Take 12.5 mg by mouth once a week. On Wednesdays 11/09/20  Yes  [provider]  Multiple Vitamins-Minerals (PRESERVISION AREDS 2 PO) Take 1 tablet by mouth 2 (two) times daily.   Yes [provider]    Family History Family History  Problem Relation Age of Onset   Alzheimer's disease Mother    Cancer Father        lung   Alzheimer's disease Maternal Grandmother    Rectal cancer Sister        in her 35s    Social History Social History   Tobacco Use   Smoking status: Former    Packs/day: 0.50    Types: Cigarettes    Quit date: 12/06/2003    Years since quitting: 18.0   Smokeless tobacco: Never  Vaping Use   Vaping Use: Never used  Substance Use Topics   Alcohol use: No   Drug use: No     Allergies   Pneumococcal vaccines   Review of Systems Review of Systems  Neurological:  Positive for dizziness.     Physical Exam Triage Vital Signs ED Triage Vitals  Enc Vitals Group     BP 12/27/21 1159 (!) 165/78     Pulse Rate 12/27/21 1159 68     Resp 12/27/21 1159 18     Temp 12/27/21 1159 98.8 F (37.1 C)     Temp Source 12/27/21 1159 Oral     SpO2 12/27/21 1159 97 %     Weight --      Height --      Head Circumference --      Peak Flow --      Pain Score 12/27/21 1200 0     Pain Loc --      Pain Edu? --      Excl. in [place]? --    No data found.  Updated Vital Signs BP (!) 165/78 (BP Location: Right Arm)   Pulse 68   Temp 98.8 F (37.1 C) (Oral)   Resp 18   SpO2 97%    Physical Exam Vitals and nursing note reviewed.  Constitutional:      General: She is not in acute distress.    Appearance: Normal appearance. She is normal weight. She is not ill-appearing.  HENT:     Head: Normocephalic and atraumatic.     Right Ear: External ear normal.     Left Ear: External ear normal.     Ears:     Comments: Bilateral EAC's with excessive cerumen unable to visualize either TM; post bilateral ear lavage: Right EAC with excessive cerumen proximal to TM, unable to visualize right TM; left EAC-clear, left  TM-red rimmed, retracted    Mouth/Throat:     Mouth: Mucous membranes are moist.     Pharynx: Oropharynx is clear.  Eyes:     Extraocular Movements: Extraocular movements intact.     Conjunctiva/sclera:  Conjunctivae normal.     Pupils: Pupils are equal, round, and reactive to light.  Cardiovascular:     Rate and Rhythm: Normal rate and regular rhythm.     Pulses: Normal pulses.     Heart sounds: Normal heart sounds.  Pulmonary:     Effort: Pulmonary effort is normal.     Breath sounds: Normal breath sounds. No wheezing, rhonchi or rales.  Musculoskeletal:     Cervical back: Normal range of motion and neck supple.  Skin:    General: Skin is warm and dry.  Neurological:     General: No focal deficit present.     Mental Status: She is alert and oriented to person, place, and time. Mental status is at baseline.      UC Treatments / Results  Labs (all labs ordered are listed, but only abnormal results are displayed) Labs Reviewed - No data to display  EKG   Radiology No results found.  Procedures Procedures (including critical care time)  Medications Ordered in UC Medications - No data to display  Initial Impression / Assessment and Plan / UC Course  I have reviewed the triage vital signs and the nursing notes.  Pertinent labs & imaging results that were available during my care of the patient were reviewed by me and considered in my medical decision making (see chart for details).     MDM: 1.  Bilateral impacted cerumen-resolved in left EAC; however, not in right EAC; 2.  Left otitis media with effusion-Rx'd Cefdinir. Advised patient to take medication as directed with food to completion.  Encouraged patient to increase daily water intake while taking this medication.  Advised patient once complete with antibiotics and may follow-up to clinic to reattempt to remove earwax from right ear canal.  Advised patient if symptoms worsen and/or unresolved please follow-up with  PCP or here for further evaluation.  Patient discharged home, hemodynamically stable. Final Clinical Impressions(s) / UC Diagnoses   Final diagnoses:  Bilateral impacted cerumen  Left otitis media with effusion     Discharge Instructions      Advised patient to take medication as directed with food to completion.  Encouraged patient to increase daily water intake while taking this medication.  Advised patient once complete with antibiotics and may follow-up to clinic to reattempt to remove earwax from right ear canal.  Advised patient if symptoms worsen and/or unresolved please follow-up with PCP or here for further evaluation.     ED Prescriptions     Medication Sig Dispense Auth. Provider   cefdinir (OMNICEF) 300 MG capsule Take 1 capsule (300 mg total) by mouth 2 (two) times daily for 7 days. 14 capsule Eliezer Lofts, FNP      PDMP not reviewed this encounter.   Eliezer Lofts, [place] 12/27/21 1313

## 2022-01-03 ENCOUNTER — Encounter: Payer: Self-pay | Admitting: Emergency Medicine

## 2022-01-03 ENCOUNTER — Ambulatory Visit
Admission: EM | Admit: 2022-01-03 | Discharge: 2022-01-03 | Disposition: A | Discharge status: Home / Self Care | Payer: Medicare HMO | Attending: Family Medicine | Admitting: Family Medicine

## 2022-01-03 DIAGNOSIS — U071 COVID-19: Secondary | ICD-10-CM

## 2022-01-03 LAB — POC SARS CORONAVIRUS 2 AG -  ED: SARS Coronavirus 2 Ag: POSITIVE — AB

## 2022-01-03 MED ORDER — PAXLOVID (300/100) 20 X 150 MG & 10 X 100MG PO TBPK: ORAL_TABLET | ORAL | 0 refills | Status: DC

## 2022-01-03 NOTE — ED Provider Notes (Signed)
[name]    CSN: [id number] Arrival date & time: 01/03/22  1147      History   Chief Complaint Chief Complaint  Patient presents with   Generalized Body Aches    HPI Carrie Hall is a 88 y.o. female.   HPI  This is a lovely 88 year old woman who is independently.  She states she has had body aches for 3 days, some cough, no shortness of breath.  She has not noticed fever or chills.  She states she is not having any trouble breathing, however she seems dyspneic in conversation.  Underlying COPD noted.   her son brought her to be seen.  Past Medical History:  Diagnosis Date   Adenomatous colon polyp    Breast cancer ([place])    COPD (chronic obstructive pulmonary disease) ([place])    Diverticulosis    Hyperlipidemia    Internal hemorrhoids    Osteopenia    Thyroid disease     Patient Active Problem List   Diagnosis Date Noted   Fall 03/19/2021   Frequent urination 11/27/2020   COPD exacerbation ([place]) 11/27/2020   Aortic atherosclerosis ([place]) 08/12/2020   Oliguria 08/11/2020   Acute right-sided low back pain without sciatica 08/11/2020   Psoriasis 11/18/2019   Essential hypertension 02/12/2018   Generalized anxiety disorder 01/01/2018   Mild cognitive impairment 11/10/2017   Elevated LFTs 10/04/2017   Loss of weight 10/04/2017   Loose stools 10/04/2017   Weight loss, non-intentional 08/07/2017   Preventative health care 08/07/2017   Cerumen impaction 08/25/2015   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   Memory loss 08/25/2015   Diarrhea 12/19/2014   KNEE PAIN, LEFT 05/27/2010   DYSURIA 05/27/2010   POSTMENOPAUSAL STATUS 04/23/2010   CERVICAL POLYP 10/26/2009   HEMATURIA, HX OF 10/26/2009   DIZZINESS 06/11/2009   FOOT, PAIN 11/17/2008   OTHER ABNORMAL BLOOD CHEMISTRY 11/17/2008   TINEA CRURIS 09/15/2008   Urinary tract infection without hematuria 09/15/2008   COLONIC POLYPS, ADENOMATOUS, HX OF 01/16/2008   HEMOCCULT POSITIVE STOOL 12/11/2007    BREAST CANCER, HX OF 12/11/2007   Hypothyroidism 12/07/2007   Hyperlipidemia LDL goal <100 12/07/2007   COPD with chronic bronchitis ([place]) 12/07/2007   Disorder of bone and articular cartilage 12/07/2007   URI 03/13/2007   HX, URINARY INFECTION 01/09/2007    Past Surgical History:  Procedure Laterality Date   BREAST LUMPECTOMY Left    radiation only   BREAST SURGERY     colon polyps      OB History   No obstetric history on file.      Home Medications    Prior to Admission medications   Medication Sig Start Date End Date Taking? Authorizing Provider  albuterol (VENTOLIN HFA) 108 (90 Base) MCG/ACT inhaler Inhale 2 puffs into the lungs every 4 (four) hours as needed for wheezing or shortness of breath. 11/27/20  Yes [name], DO  cefdinir (OMNICEF) 300 MG capsule Take 1 capsule (300 mg total) by mouth 2 (two) times daily for 7 days. 12/27/21 01/03/22 Yes Eliezer Lofts, FNP  nirmatrelvir & ritonavir (PAXLOVID, 300/100,) 20 x 150 MG & 10 x '100MG'$  TBPK Take as directed 01/03/22  Yes [name], MD  acetaminophen (TYLENOL) 325 MG tablet Take 650 mg by mouth every 8 (eight) hours as needed for moderate pain.    [provider]  alendronate (FOSAMAX) 70 MG tablet Take 1 tablet (70 mg total) by mouth every 7 (seven) days. Take with  a full glass of water on an empty stomach. 06/14/21   [name], DO  ALPRAZolam (XANAX) 0.25 MG tablet Take 1 tablet (0.25 mg total) by mouth at bedtime as needed for anxiety. 08/10/21   [name], DO  atorvastatin (LIPITOR) 20 MG tablet TAKE ONE TABLET BY MOUTH ONCE DAILY 09/23/21   Carollee Herter, Alferd Apa, DO  fluticasone-salmeterol (ADVAIR HFA) 115-21 MCG/ACT inhaler Inhale 2 puffs into the lungs 2 (two) times daily. Remember to rinse mouth with water after each use of Advair inhaler. 11/18/21   [name], DO  folic acid (FOLVITE) 1 MG tablet Take 1 mg by mouth daily.    [provider]   levothyroxine (SYNTHROID) 88 MCG tablet Take 1 tablet (88 mcg total) by mouth daily before breakfast. 12/16/21   Carollee Herter, Alferd Apa, DO  methotrexate (RHEUMATREX) 2.5 MG tablet Take 12.5 mg by mouth once a week. On Wednesdays 11/09/20   [provider]  Multiple Vitamins-Minerals (PRESERVISION AREDS 2 PO) Take 1 tablet by mouth 2 (two) times daily.    [provider]    Family History Family History  Problem Relation Age of Onset   Alzheimer's disease Mother    Cancer Father        lung   Alzheimer's disease Maternal Grandmother    Rectal cancer Sister        in her 36s    Social History Social History   Tobacco Use   Smoking status: Former    Packs/day: 0.50    Types: Cigarettes    Quit date: 12/06/2003    Years since quitting: 18.0   Smokeless tobacco: Never  Vaping Use   Vaping Use: Never used  Substance Use Topics   Alcohol use: No   Drug use: No     Allergies   Pneumococcal vaccines   Review of Systems Review of Systems See HPI  Physical Exam Triage Vital Signs ED Triage Vitals  Enc Vitals Group     BP 01/03/22 1216 133/70     Pulse Rate 01/03/22 1216 70     Resp 01/03/22 1216 (!) 24     Temp 01/03/22 1216 99.3 F (37.4 C)     Temp Source 01/03/22 1216 Oral     SpO2 01/03/22 1216 92 %     Weight 01/03/22 1218 157 lb (71.2 kg)     Height --      Head Circumference --      Peak Flow --      Pain Score 01/03/22 1217 0     Pain Loc --      Pain Edu? --      Excl. in [place]? --    No data found.  Updated Vital Signs BP 133/70 (BP Location: Left Arm)   Pulse 70   Temp 99.3 F (37.4 C) (Oral)   Resp (!) 24   Wt 71.2 kg   SpO2 92%   BMI 26.95 kg/m       Physical Exam Constitutional:      General: She is not in acute distress.    Appearance: She is well-developed. She is ill-appearing.  HENT:     Head: Normocephalic and atraumatic.     Right Ear: Tympanic membrane normal.     Left Ear: Tympanic membrane and ear canal  normal.     Mouth/Throat:     Mouth: Mucous membranes are moist.     Pharynx: No posterior oropharyngeal erythema.  Eyes:     Conjunctiva/sclera: Conjunctivae normal.     Pupils: Pupils are equal, round, and reactive to light.  Cardiovascular:     Rate and Rhythm: Normal rate and regular rhythm.     Heart sounds: Normal heart sounds.  Pulmonary:     Effort: Pulmonary effort is normal. No respiratory distress.     Breath sounds: Rhonchi present.     Comments: Few scattered rhonchi Abdominal:     General: There is no distension.     Palpations: Abdomen is soft.  Musculoskeletal:        General: Normal range of motion.     Cervical back: Normal range of motion.  Skin:    General: Skin is warm and dry.  Neurological:     Mental Status: She is alert.  Psychiatric:        Mood and Affect: Mood normal.        Behavior: Behavior normal.      UC Treatments / Results  Labs (all labs ordered are listed, but only abnormal results are displayed) Labs Reviewed  POC SARS CORONAVIRUS 2 AG -  ED - Abnormal; Notable for the following components:      Result Value   SARS Coronavirus 2 Ag Positive (*)    All other components within normal limits    EKG   Radiology No results found.  Procedures Procedures (including critical care time)  Medications Ordered in UC Medications - No data to display  Initial Impression / Assessment and Plan / UC Course  I have reviewed the triage vital signs and the nursing notes.  Pertinent labs & imaging results that were available during my care of the patient were reviewed by me and considered in my medical decision making (see chart for details).     Final Clinical Impressions(s) / UC Diagnoses   Final diagnoses:  [id number]     Discharge Instructions      You may use albuterol inhaler as needed for shortness of breath or wheezing Do not use Advair while you are on the Paxlovid Do not take Lipitor for 5 days while you are on the  Paxlovid Take Paxlovid 2 times a day for 5 days If you get worse instead of better at any time, go to the emergency room   ED Prescriptions     Medication Sig Dispense Auth. Provider   nirmatrelvir & ritonavir (PAXLOVID, 300/100,) 20 x 150 MG & 10 x '100MG'$  TBPK Take as directed 1 each Meda Coffee Jennette Banker, MD      PDMP not reviewed this encounter.   [name], MD 01/03/22 Darlin Drop

## 2022-01-03 NOTE — ED Notes (Signed)
Confirmed with [facility] who confirmed they had the medicine (paxlovid) available

## 2022-01-03 NOTE — ED Triage Notes (Signed)
Body aches since Friday night  Cough since  Friday  Son in the lobby - pt lives alone  Denies SOB  Covid vaccine x 2 - no booster

## 2022-01-03 NOTE — ED Notes (Signed)
Call to pharmacy on record to see if they were able to fill pt's prescription and deliver it to her today. They do not have the medication

## 2022-01-03 NOTE — Discharge Instructions (Signed)
You may use albuterol inhaler as needed for shortness of breath or wheezing Do not use Advair while you are on the Paxlovid Do not take Lipitor for 5 days while you are on the Paxlovid Take Paxlovid 2 times a day for 5 days If you get worse instead of better at any time, go to the emergency room

## 2022-01-04 ENCOUNTER — Telehealth: Payer: Self-pay | Admitting: Emergency Medicine

## 2022-01-04 MED ORDER — ALBUTEROL SULFATE HFA 108 (90 BASE) MCG/ACT IN AERS: 1.0000 | INHALATION_SPRAY | Freq: Four times a day (QID) | RESPIRATORY_TRACT | 0 refills | Status: DC | PRN

## 2022-01-04 NOTE — Telephone Encounter (Signed)
Patient desires refill on her albuterol inhaler Patient states she feels better today after taking Paxlovid

## 2022-02-02 DIAGNOSIS — L408 Other psoriasis: Secondary | ICD-10-CM | POA: Diagnosis not present

## 2022-02-08 DIAGNOSIS — Z79899 Other long term (current) drug therapy: Secondary | ICD-10-CM | POA: Diagnosis not present

## 2022-02-08 DIAGNOSIS — L400 Psoriasis vulgaris: Secondary | ICD-10-CM | POA: Diagnosis not present

## 2022-02-18 ENCOUNTER — Ambulatory Visit: Payer: Medicare HMO | Admitting: Pharmacist

## 2022-02-18 DIAGNOSIS — M8580 Other specified disorders of bone density and structure, unspecified site: Secondary | ICD-10-CM

## 2022-02-18 DIAGNOSIS — J4489 Other specified chronic obstructive pulmonary disease: Secondary | ICD-10-CM

## 2022-02-18 NOTE — Progress Notes (Signed)
Pharmacy Note  02/18/2022 Name: Carrie Hall MRN: [id number] DOB: [birth year]  Subjective: Carrie Hall is a 88 y.o. year old female who is a primary care patient of Carrie Hall, Carrie Apa, DO. Clinical Pharmacist Practitioner referral was placed to assist with medication management.    Engaged with patient by telephone for follow up visit today.  Reviewed medication list with patient. Patient reports she needs refills on alendronate and methotrexate. Asthma - Patient has COVID 01/03/2022. She reports symptoms were mild and she is feeling well now. Patient use albuterol when she had COVID but reports today that she has only needed once or twice in the last month. She is using Advair 2 puffs twice a day as needed.  Osteoporosis - Taking alendronate '70mg'$  weekly since 2019. Tolerating well. Last BMD was 09/08/2017     Recent Visits:   01/03/2022 - [facility] for generalized body aches. COVID positive. Prescribed albuterol as needed. Paxlovid fro 5 days. Hold atorvastatin and Advair while taking Paxlovid.   12/27/2021 - Urgent [facility]. Seen for dizziness. Noted bilateral cerumen impaction. Cleaned ears. Prescribed cefdinir for left otitis media wiht effusion.   12/09/2021 - [facility] (Dr Carrie Hall) Follow up chronic conditions. Patient refused neuro follow up, denies any memory loss. Labs checked. No med changes.    SDOH (Social Determinants of Health) assessments and interventions performed:  SDOH Interventions    Flowsheet Row Chronic Care Management from 11/18/2021 in [facility] [place] Management from 05/27/2021 in [place] at [place] Interventions    Transportation Interventions Intervention Not Indicated  [patient has family that assists with transportation] --  Financial Strain Interventions Intervention Not Indicated Intervention Not Indicated         Objective: Review of patient status, including review of consultants reports, laboratory and other test data, was performed as part of comprehensive evaluation and provision of chronic care management services.   Lab Results  Component Value Date   CREATININE 0.71 12/09/2021   CREATININE 0.76 06/10/2021   CREATININE 0.67 03/08/2021    Lab Results  Component Value Date   HGBA1C 6.1 07/20/2012       Component Value Date/Time   CHOL 180 12/09/2021 0927   TRIG 151.0 (H) 12/09/2021 0927   TRIG 89 02/27/2006 0859   HDL 52.20 12/09/2021 0927   CHOLHDL 3 12/09/2021 0927   VLDL 30.2 12/09/2021 0927   LDLCALC 97 12/09/2021 0927   LDLDIRECT 103.3 03/07/2014 1123     Clinical ASCVD:  noted to have atherosclerosis of aorta The ASCVD Risk score (Arnett DK, et al., 2019) failed to calculate for the following reasons:   The 2019 ASCVD risk score is only valid for ages 44 to 61    BP Readings from Last 3 Encounters:  01/03/22 133/70  12/27/21 (!) 165/78  12/09/21 118/80     Allergies  Allergen Reactions   Pneumococcal Vaccines Itching and Swelling   Donepezil Hcl Diarrhea    Medications Reviewed Today     Reviewed by Carrie Hall, RPH-CPP (Pharmacist) on 02/18/22 at 1328  Med List Status: <None>   Medication Order Taking? Sig Documenting Provider Last Dose Status Informant  acetaminophen (TYLENOL) 325 MG tablet [id number] Yes Take 650 mg by mouth every 8 (eight) hours as needed for moderate pain. [provider] Taking Active   albuterol (VENTOLIN HFA) 108 (90 Base) MCG/ACT inhaler [id number] Yes Inhale 1-2 puffs  into the lungs every 6 (six) hours as needed for wheezing or shortness of breath. Carrie Everts, MD Taking Active   alendronate (FOSAMAX) 70 MG tablet [id number] Yes Take 1 tablet (70 mg total) by mouth every 7 (seven) days. Take with a full glass of water on an empty stomach. Carrie Schanz R, DO Taking Active   ALPRAZolam Duanne Moron) 0.25 MG tablet  [id number] Yes Take 1 tablet (0.25 mg total) by mouth at bedtime as needed for anxiety. Carrie Hall, Kendrick Fries R, DO Taking Active   atorvastatin (LIPITOR) 20 MG tablet [id number] Yes TAKE ONE TABLET BY MOUTH ONCE DAILY [name], DO Taking Active   fluticasone-salmeterol (ADVAIR [facility]) 115-21 MCG/ACT inhaler [id number] Yes Inhale 2 puffs into the lungs 2 (two) times daily. Remember to rinse mouth with water after each use of Advair inhaler. [name], DO Taking Active            Med Note Carrie Hall Jan 03, 2022 12:21 PM) Uses prn   folic acid (FOLVITE) 1 MG tablet [id number] Yes Take 1 mg by mouth daily. [provider] Taking Active   levothyroxine (SYNTHROID) 88 MCG tablet [id number] Yes Take 1 tablet (88 mcg total) by mouth daily before breakfast. Carrie Hall, Carrie Apa, DO Taking Active   methotrexate (RHEUMATREX) 2.5 MG tablet [id number] Yes Take 12.5 mg by mouth once a week. On Wednesdays [provider] Taking Active   Multiple Vitamins-Minerals (PRESERVISION AREDS 2 PO) [id number] Yes Take 1 tablet by mouth 2 (two) times daily. [provider] Taking Active             Patient Active Problem List   Diagnosis Date Noted   Fall 03/19/2021   Frequent urination 11/27/2020   COPD exacerbation ([place]) 11/27/2020   Aortic atherosclerosis ([place]) 08/12/2020   Oliguria 08/11/2020   Acute right-sided low back pain without sciatica 08/11/2020   Psoriasis 11/18/2019   Essential hypertension 02/12/2018   Generalized anxiety disorder 01/01/2018   Mild cognitive impairment 11/10/2017   Elevated LFTs 10/04/2017   Loss of weight 10/04/2017   Loose stools 10/04/2017   Weight loss, non-intentional 08/07/2017   Preventative health care 08/07/2017   Cerumen impaction 08/25/2015   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   Memory loss 08/25/2015   Diarrhea 12/19/2014   KNEE PAIN, LEFT 05/27/2010   DYSURIA 05/27/2010   POSTMENOPAUSAL STATUS  04/23/2010   CERVICAL POLYP 10/26/2009   HEMATURIA, HX OF 10/26/2009   DIZZINESS 06/11/2009   FOOT, PAIN 11/17/2008   OTHER ABNORMAL BLOOD CHEMISTRY 11/17/2008   TINEA CRURIS 09/15/2008   Urinary tract infection without hematuria 09/15/2008   COLONIC POLYPS, ADENOMATOUS, HX OF 01/16/2008   HEMOCCULT POSITIVE STOOL 12/11/2007   BREAST CANCER, HX OF 12/11/2007   Hypothyroidism 12/07/2007   Hyperlipidemia LDL goal <100 12/07/2007   COPD with chronic bronchitis ([place]) 12/07/2007   Disorder of bone and articular cartilage 12/07/2007   URI 03/13/2007   HX, URINARY INFECTION 01/09/2007     Medication Assistance:  None required.  Patient affirms current coverage meets needs.   Assessment / Plan:  Osteoporosis Continue alendronate Consider rechecking BMD / DEXA - reminded that she has order on file.  COPD - controlled Reviewed maintenance verus as needed medications.  Medication Management -  requested refills for methotrexate and alendronate with pharmacy. Reminded patient to take folic acid along with methotrexate. Patient endorsed that she is taking folic acid.  Health maintenance -  discussed getting flu vaccine - appointment made for flu vaccine for 02/24/2022 at 10:45am  Follow Up:  Telephone follow up appointment with care management team member scheduled for:  3 to 4 months   Carrie Hall, PharmD Clinical Pharmacist [place] [phone]

## 2022-02-24 ENCOUNTER — Ambulatory Visit (INDEPENDENT_AMBULATORY_CARE_PROVIDER_SITE_OTHER): Payer: Medicare HMO

## 2022-02-24 ENCOUNTER — Encounter: Payer: Self-pay | Admitting: Family Medicine

## 2022-02-24 DIAGNOSIS — Z23 Encounter for immunization: Secondary | ICD-10-CM

## 2022-03-08 DIAGNOSIS — H353132 Nonexudative age-related macular degeneration, bilateral, intermediate dry stage: Secondary | ICD-10-CM | POA: Diagnosis not present

## 2022-03-08 DIAGNOSIS — H35033 Hypertensive retinopathy, bilateral: Secondary | ICD-10-CM | POA: Diagnosis not present

## 2022-03-08 DIAGNOSIS — H35373 Puckering of macula, bilateral: Secondary | ICD-10-CM | POA: Diagnosis not present

## 2022-03-14 ENCOUNTER — Other Ambulatory Visit: Payer: Self-pay | Admitting: Family Medicine

## 2022-03-22 ENCOUNTER — Other Ambulatory Visit: Payer: Self-pay

## 2022-03-22 ENCOUNTER — Telehealth: Payer: Self-pay | Admitting: Family Medicine

## 2022-03-22 DIAGNOSIS — J410 Simple chronic bronchitis: Secondary | ICD-10-CM

## 2022-03-22 MED ORDER — FLUTICASONE-SALMETEROL 115-21 MCG/ACT IN AERO: 2.0000 | INHALATION_SPRAY | Freq: Two times a day (BID) | RESPIRATORY_TRACT | 5 refills | Status: DC

## 2022-03-22 NOTE — Telephone Encounter (Signed)
Refills sent

## 2022-03-22 NOTE — Telephone Encounter (Signed)
Medication: fluticasone-salmeterol (ADVAIR HFA) 115-21 MCG/ACT inhaler   Has the patient contacted their pharmacy?    Preferred Pharmacy (with phone number or street name):  [facility] [place], [place] [[id number]]

## 2022-04-29 ENCOUNTER — Other Ambulatory Visit (HOSPITAL_BASED_OUTPATIENT_CLINIC_OR_DEPARTMENT_OTHER): Payer: Self-pay

## 2022-04-29 ENCOUNTER — Ambulatory Visit (INDEPENDENT_AMBULATORY_CARE_PROVIDER_SITE_OTHER): Payer: Medicare HMO | Admitting: Family Medicine

## 2022-04-29 VITALS — BP 132/88 | HR 79 | Temp 98.3°F | Resp 20 | Ht 64.0 in | Wt 144.8 lb

## 2022-04-29 DIAGNOSIS — R300 Dysuria: Secondary | ICD-10-CM

## 2022-04-29 DIAGNOSIS — N390 Urinary tract infection, site not specified: Secondary | ICD-10-CM

## 2022-04-29 DIAGNOSIS — R319 Hematuria, unspecified: Secondary | ICD-10-CM

## 2022-04-29 LAB — POC URINALSYSI DIPSTICK (AUTOMATED)
Bilirubin, UA: NEGATIVE
Blood, UA: NEGATIVE
Glucose, UA: NEGATIVE
Ketones, UA: NEGATIVE
Nitrite, UA: POSITIVE
Protein, UA: POSITIVE — AB
Spec Grav, UA: <=1.005 — AB (ref 1.010–1.025)
Urobilinogen, UA: 0.2 E.U./dL
pH, UA: 7 (ref 5.0–8.0)

## 2022-04-29 MED ORDER — CEPHALEXIN 500 MG PO CAPS: 500.0000 mg | ORAL_CAPSULE | Freq: Two times a day (BID) | ORAL | 0 refills | Status: DC

## 2022-04-29 MED ORDER — CEPHALEXIN 500 MG PO CAPS
500.0000 mg | ORAL_CAPSULE | Freq: Two times a day (BID) | ORAL | 0 refills | Status: DC
  Filled 2022-04-29: qty 14, 7d supply, fill #0

## 2022-04-29 NOTE — Patient Instructions (Signed)
Urinary Tract Infection, Adult  A urinary tract infection (UTI) is an infection of any part of the urinary tract. The urinary tract includes the kidneys, ureters, bladder, and urethra. These organs make, store, and get rid of urine in the body. An upper UTI affects the ureters and kidneys. A lower UTI affects the bladder and urethra. What are the causes? Most urinary tract infections are caused by bacteria in your genital area around your urethra, where urine leaves your body. These bacteria grow and cause inflammation of your urinary tract. What increases the risk? You are more likely to develop this condition if: You have a urinary catheter that stays in place. You are not able to control when you urinate or have a bowel movement (incontinence). You are female and you: Use a spermicide or diaphragm for birth control. Have low estrogen levels. Are pregnant. You have certain genes that increase your risk. You are sexually active. You take antibiotic medicines. You have a condition that causes your flow of urine to slow down, such as: An enlarged prostate, if you are female. Blockage in your urethra. A kidney stone. A nerve condition that affects your bladder control (neurogenic bladder). Not getting enough to drink, or not urinating often. You have certain medical conditions, such as: Diabetes. A weak disease-fighting system (immunesystem). Sickle cell disease. Gout. Spinal cord injury. What are the signs or symptoms? Symptoms of this condition include: Needing to urinate right away (urgency). Frequent urination. This may include small amounts of urine each time you urinate. Pain or burning with urination. Blood in the urine. Urine that smells bad or unusual. Trouble urinating. Cloudy urine. Vaginal discharge, if you are female. Pain in the abdomen or the lower back. You may also have: Vomiting or a decreased appetite. Confusion. Irritability or tiredness. A fever or  chills. Diarrhea. The first symptom in older adults may be confusion. In some cases, they may not have any symptoms until the infection has worsened. How is this diagnosed? This condition is diagnosed based on your medical history and a physical exam. You may also have other tests, including: Urine tests. Blood tests. Tests for STIs (sexually transmitted infections). If you have had more than one UTI, a cystoscopy or imaging studies may be done to determine the cause of the infections. How is this treated? Treatment for this condition includes: Antibiotic medicine. Over-the-counter medicines to treat discomfort. Drinking enough water to stay hydrated. If you have frequent infections or have other conditions such as a kidney stone, you may need to see a health care provider who specializes in the urinary tract (urologist). In rare cases, urinary tract infections can cause sepsis. Sepsis is a life-threatening condition that occurs when the body responds to an infection. Sepsis is treated in the hospital with IV antibiotics, fluids, and other medicines. Follow these instructions at home:  Medicines Take over-the-counter and prescription medicines only as told by your health care provider. If you were prescribed an antibiotic medicine, take it as told by your health care provider. Do not stop using the antibiotic even if you start to feel better. General instructions Make sure you: Empty your bladder often and completely. Do not hold urine for long periods of time. Empty your bladder after sex. Wipe from front to back after urinating or having a bowel movement if you are female. Use each tissue only one time when you wipe. Drink enough fluid to keep your urine pale yellow. Keep all follow-up visits. This is important. Contact a health   care provider if: Your symptoms do not get better after 1-2 days. Your symptoms go away and then return. Get help right away if: You have severe pain in  your back or your lower abdomen. You have a fever or chills. You have nausea or vomiting. Summary A urinary tract infection (UTI) is an infection of any part of the urinary tract, which includes the kidneys, ureters, bladder, and urethra. Most urinary tract infections are caused by bacteria in your genital area. Treatment for this condition often includes antibiotic medicines. If you were prescribed an antibiotic medicine, take it as told by your health care provider. Do not stop using the antibiotic even if you start to feel better. Keep all follow-up visits. This is important. This information is not intended to replace advice given to you by your health care provider. Make sure you discuss any questions you have with your health care provider. Document Revised: 12/06/2019 Document Reviewed: 12/06/2019 Elsevier Patient Education  2023 Elsevier Inc.  

## 2022-04-29 NOTE — Progress Notes (Unsigned)
   Established Patient Office Visit  Subjective   Patient ID: Carrie Hall, female    DOB: 1933/11/06  Age: 88 y.o. MRN: [id number]  Chief Complaint  Patient presents with   Dysuria    Sxs started yesterday evening. Pt states having some burning and pressure.    HPI Pt is here c/o dysuria since Wednesday.  {History (Optional):23778}  ROS    Objective:     BP 132/88 (BP Location: Left Arm, Patient Position: Sitting, Cuff Size: Normal)   Pulse 79   Temp 98.3 F (36.8 C) (Oral)   Resp 20   Ht '5\' 4"'$  (1.626 m)   Wt 144 lb 12.8 oz (65.7 kg)   SpO2 96%   BMI 24.85 kg/m  {Vitals History (Optional):23777}  Physical Exam   No results found for any visits on 04/29/22.  {Labs (Optional):23779}  The ASCVD Risk score (Arnett DK, et al., 2019) failed to calculate for the following reasons:   The 2019 ASCVD risk score is only valid for ages 83 to 75    Assessment & Plan:   Problem List Items Addressed This Visit       Unprioritized   DYSURIA - Primary   Relevant Orders   POCT Urinalysis Dipstick (Automated)   Urine Culture    No follow-ups on file.    [name], DO

## 2022-05-01 LAB — URINE CULTURE
MICRO NUMBER:: 14350739
SPECIMEN QUALITY:: ADEQUATE

## 2022-05-04 ENCOUNTER — Encounter: Payer: Self-pay | Admitting: Family Medicine

## 2022-05-04 NOTE — Assessment & Plan Note (Signed)
Keflex per orders Culture done  Return to office as needed

## 2022-05-13 ENCOUNTER — Other Ambulatory Visit: Payer: Self-pay | Admitting: Family Medicine

## 2022-05-13 ENCOUNTER — Ambulatory Visit (INDEPENDENT_AMBULATORY_CARE_PROVIDER_SITE_OTHER): Payer: Medicare HMO | Admitting: Pharmacist

## 2022-05-13 DIAGNOSIS — M8580 Other specified disorders of bone density and structure, unspecified site: Secondary | ICD-10-CM

## 2022-05-13 DIAGNOSIS — J4489 Other specified chronic obstructive pulmonary disease: Secondary | ICD-10-CM

## 2022-05-13 NOTE — Progress Notes (Signed)
Pharmacy Note  05/13/2022 Name: Carrie Hall MRN: [id number] DOB: April 22, 1934  Subjective: Carrie Hall is a 89 y.o. year old female who is a primary care patient of Carrie Hall, Carrie Apa, DO. Clinical Pharmacist Practitioner referral was placed to assist with medication management.    Engaged with patient by telephone for follow up visit today.   COPD - Uses Advair inhaler - 2 puffs twice a day. She also has rescue inhaler albuterol but hasn't really needed to use more than 1 or 2 times per month recently.   Osteoporosis - Taking alendronate '70mg'$  weekly since 2019. Tolerating well. Last BMD was 09/08/2017   Medication Management: patient reports she is  getting meds filled at [facility]. They delivered medications last in December. She reports her last lose of alendronate will be 05/30/22 and last methotrexate dose next week on 05/25/2022. She will need refills for both of these medications and folic acid.   Objective: Review of patient status, including review of consultants reports, laboratory and other test data, was performed as part of comprehensive.  Lab Results  Component Value Date   CREATININE 0.71 12/09/2021   CREATININE 0.76 06/10/2021   CREATININE 0.67 03/08/2021    Lab Results  Component Value Date   HGBA1C 6.1 07/20/2012       Component Value Date/Time   CHOL 180 12/09/2021 0927   TRIG 151.0 (H) 12/09/2021 0927   TRIG 89 02/27/2006 0859   HDL 52.20 12/09/2021 0927   CHOLHDL 3 12/09/2021 0927   VLDL 30.2 12/09/2021 0927   LDLCALC 97 12/09/2021 0927   LDLDIRECT 103.3 03/07/2014 1123     Clinical ASCVD: Yes  The ASCVD Risk score (Arnett DK, et al., 2019) failed to calculate for the following reasons:   The 2019 ASCVD risk score is only valid for ages 2 to 67    BP Readings from Last 3 Encounters:  04/29/22 132/88  01/03/22 133/70  12/27/21 (!) 165/78     Allergies  Allergen Reactions   Pneumococcal Vaccines Itching and Swelling   Donepezil Hcl  Diarrhea    Medications Reviewed Today     Reviewed by Cherre Robins, RPH-CPP (Pharmacist) on 05/13/22 at [place] List Status: <None>   Medication Order Taking? Sig Documenting Provider Last Dose Status Informant  acetaminophen (TYLENOL) 325 MG tablet [id number] Yes Take 650 mg by mouth every 8 (eight) hours as needed for moderate pain. [provider] Taking Active   albuterol (VENTOLIN HFA) 108 (90 Base) MCG/ACT inhaler [id number] No Inhale 1-2 puffs into the lungs every 6 (six) hours as needed for wheezing or shortness of breath.  Patient not taking: Reported on 05/13/2022   Carrie Everts, MD Not Taking Active   alendronate (FOSAMAX) 70 MG tablet [id number] Yes Take 1 tablet (70 mg total) by mouth every 7 (seven) days. Take with a full glass of water on an empty stomach. [name], DO Taking Active   ALPRAZolam Carrie Hall) 0.25 MG tablet [id number] No Take 1 tablet (0.25 mg total) by mouth at bedtime as needed for anxiety.  Patient not taking: Reported on 05/13/2022   [name], DO Not Taking Active   atorvastatin (LIPITOR) 20 MG tablet [id number] Yes Take 1 tablet (20 mg total) by mouth daily. [name], DO Taking Active   fluticasone-salmeterol (ADVAIR HFA) 169-45 MCG/ACT inhaler [id number] Yes Inhale 2 puffs into the lungs 2 (two) times daily. Remember to rinse mouth with water after each  use of Advair inhaler. Carrie Hall R, DO Taking Active   folic acid (FOLVITE) 1 MG tablet [id number] Yes Take 1 mg by mouth daily. [provider] Taking Active   levothyroxine (SYNTHROID) 88 MCG tablet [id number] Yes Take 1 tablet (88 mcg total) by mouth daily before breakfast. Carrie Hall, Carrie Apa, DO Taking Active   methotrexate (RHEUMATREX) 2.5 MG tablet [id number] Yes Take 12.5 mg by mouth once a week. On Wednesdays [provider] Taking Active   Multiple Vitamins-Minerals (PRESERVISION AREDS 2 PO) [id number]  Take 1 tablet by mouth 2  (two) times daily. [provider]  Active             Patient Active Problem List   Diagnosis Date Noted   Fall 03/19/2021   Frequent urination 11/27/2020   COPD exacerbation ([place]) 11/27/2020   Aortic atherosclerosis ([place]) 08/12/2020   Oliguria 08/11/2020   Acute right-sided low back pain without sciatica 08/11/2020   Psoriasis 11/18/2019   Essential hypertension 02/12/2018   Generalized anxiety disorder 01/01/2018   Mild cognitive impairment 11/10/2017   Elevated LFTs 10/04/2017   Loss of weight 10/04/2017   Loose stools 10/04/2017   Weight loss, non-intentional 08/07/2017   Preventative health care 08/07/2017   Cerumen impaction 08/25/2015   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   Memory loss 08/25/2015   Diarrhea 12/19/2014   KNEE PAIN, LEFT 05/27/2010   DYSURIA 05/27/2010   POSTMENOPAUSAL STATUS 04/23/2010   CERVICAL POLYP 10/26/2009   HEMATURIA, HX OF 10/26/2009   DIZZINESS 06/11/2009   FOOT, PAIN 11/17/2008   OTHER ABNORMAL BLOOD CHEMISTRY 11/17/2008   TINEA CRURIS 09/15/2008   Urinary tract infection with hematuria 09/15/2008   COLONIC POLYPS, ADENOMATOUS, HX OF 01/16/2008   HEMOCCULT POSITIVE STOOL 12/11/2007   BREAST CANCER, HX OF 12/11/2007   Hypothyroidism 12/07/2007   Hyperlipidemia LDL goal <100 12/07/2007   COPD with chronic bronchitis ([place]) 12/07/2007   Disorder of bone and articular cartilage 12/07/2007   URI 03/13/2007   HX, URINARY INFECTION 01/09/2007     Medication Assistance:  None required.  Patient affirms current coverage meets needs.   Assessment / Plan: COPD Continue Advair inhaler - 2 puffs twice a day. Also continue rescue inhaler albuterol as needed    Osteoporosis -  Continue alendronate '70mg'$  weekly.  Consider rechecking DEXA and possible break from bisphosphonate since has taken for about 5 years.   Medication Management:  Reviewed and updated medication list Reviewed refill history and adherence Coordinated  refills for alendronate, methotrexate and folic acid with [facility] (LM on VM requesting refills and delivery to patient in the next 1 to 2 weeks)   Follow Up:  No further follow up required: Patient will see PCP 06/2022. Patient has met medication related goals.  Patient is aware they can reach out to Clinical Pharmacist Practitioner if they have questions regarding medications in the future.    Cherre Robins, PharmD Clinical Pharmacist [place] [phone]

## 2022-05-20 ENCOUNTER — Telehealth: Payer: Medicare HMO

## 2022-06-14 ENCOUNTER — Encounter: Payer: Medicare HMO | Admitting: Family Medicine

## 2022-06-21 ENCOUNTER — Encounter: Payer: Self-pay | Admitting: Family Medicine

## 2022-06-21 ENCOUNTER — Ambulatory Visit (INDEPENDENT_AMBULATORY_CARE_PROVIDER_SITE_OTHER): Payer: Medicare HMO | Admitting: Family Medicine

## 2022-06-21 ENCOUNTER — Telehealth: Payer: Self-pay | Admitting: *Deleted

## 2022-06-21 VITALS — BP 110/80 | HR 44 | Temp 97.7°F | Resp 20 | Ht 64.0 in | Wt 143.6 lb

## 2022-06-21 DIAGNOSIS — Z0000 Encounter for general adult medical examination without abnormal findings: Secondary | ICD-10-CM

## 2022-06-21 DIAGNOSIS — G309 Alzheimer's disease, unspecified: Secondary | ICD-10-CM | POA: Diagnosis not present

## 2022-06-21 DIAGNOSIS — R69 Illness, unspecified: Secondary | ICD-10-CM | POA: Diagnosis not present

## 2022-06-21 DIAGNOSIS — F0280 Dementia in other diseases classified elsewhere without behavioral disturbance: Secondary | ICD-10-CM

## 2022-06-21 DIAGNOSIS — Z853 Personal history of malignant neoplasm of breast: Secondary | ICD-10-CM | POA: Diagnosis not present

## 2022-06-21 DIAGNOSIS — H9193 Unspecified hearing loss, bilateral: Secondary | ICD-10-CM

## 2022-06-21 DIAGNOSIS — E785 Hyperlipidemia, unspecified: Secondary | ICD-10-CM

## 2022-06-21 DIAGNOSIS — R413 Other amnesia: Secondary | ICD-10-CM

## 2022-06-21 DIAGNOSIS — M8580 Other specified disorders of bone density and structure, unspecified site: Secondary | ICD-10-CM | POA: Insufficient documentation

## 2022-06-21 DIAGNOSIS — R3915 Urgency of urination: Secondary | ICD-10-CM

## 2022-06-21 DIAGNOSIS — J4489 Other specified chronic obstructive pulmonary disease: Secondary | ICD-10-CM

## 2022-06-21 DIAGNOSIS — E039 Hypothyroidism, unspecified: Secondary | ICD-10-CM

## 2022-06-21 DIAGNOSIS — I700 Atherosclerosis of aorta: Secondary | ICD-10-CM

## 2022-06-21 DIAGNOSIS — H6121 Impacted cerumen, right ear: Secondary | ICD-10-CM

## 2022-06-21 DIAGNOSIS — I10 Essential (primary) hypertension: Secondary | ICD-10-CM

## 2022-06-21 LAB — CBC WITH DIFFERENTIAL/PLATELET
Basophils Absolute: 0.1 10*3/uL (ref 0.0–0.1)
Basophils Relative: 1.4 % (ref 0.0–3.0)
Eosinophils Absolute: 0.1 10*3/uL (ref 0.0–0.7)
Eosinophils Relative: 1.1 % (ref 0.0–5.0)
HCT: 41.6 % (ref 36.0–46.0)
Hemoglobin: 13.9 g/dL (ref 12.0–15.0)
Lymphocytes Relative: 19.3 % (ref 12.0–46.0)
Lymphs Abs: 1.8 10*3/uL (ref 0.7–4.0)
MCHC: 33.4 g/dL (ref 30.0–36.0)
MCV: 90.7 fl (ref 78.0–100.0)
Monocytes Absolute: 0.9 10*3/uL (ref 0.1–1.0)
Monocytes Relative: 10.1 % (ref 3.0–12.0)
Neutro Abs: 6.2 10*3/uL (ref 1.4–7.7)
Neutrophils Relative %: 68.1 % (ref 43.0–77.0)
Platelets: 255 10*3/uL (ref 150.0–400.0)
RBC: 4.59 Mil/uL (ref 3.87–5.11)
RDW: 14.6 % (ref 11.5–15.5)
WBC: 9.1 10*3/uL (ref 4.0–10.5)

## 2022-06-21 LAB — LIPID PANEL
Cholesterol: 191 mg/dL (ref 0–200)
HDL: 63.7 mg/dL (ref >39.00)
LDL Cholesterol: 106 mg/dL — ABNORMAL HIGH (ref 0–99)
NonHDL: 127.03
Total CHOL/HDL Ratio: 3
Triglycerides: 104 mg/dL (ref 0.0–149.0)
VLDL: 20.8 mg/dL (ref 0.0–40.0)

## 2022-06-21 LAB — COMPREHENSIVE METABOLIC PANEL
ALT: 11 U/L (ref 0–35)
AST: 13 U/L (ref 0–37)
Albumin: 4.2 g/dL (ref 3.5–5.2)
Alkaline Phosphatase: 75 U/L (ref 39–117)
BUN: 14 mg/dL (ref 6–23)
CO2: 32 mEq/L (ref 19–32)
Calcium: 9.6 mg/dL (ref 8.4–10.5)
Chloride: 99 mEq/L (ref 96–112)
Creatinine, Ser: 0.72 mg/dL (ref 0.40–1.20)
GFR: 75.67 mL/min (ref >60.00)
Glucose, Bld: 89 mg/dL (ref 70–99)
Potassium: 4.4 mEq/L (ref 3.5–5.1)
Sodium: 139 mEq/L (ref 135–145)
Total Bilirubin: 0.6 mg/dL (ref 0.2–1.2)
Total Protein: 6.8 g/dL (ref 6.0–8.3)

## 2022-06-21 LAB — TSH: TSH: 1.61 u[IU]/mL (ref 0.35–5.50)

## 2022-06-21 NOTE — Assessment & Plan Note (Signed)
On statin Tolerating statin, encouraged heart healthy diet, avoid trans fats, minimize simple carbs and saturated fats. Increase exercise as tolerated

## 2022-06-21 NOTE — Assessment & Plan Note (Signed)
Pt refusing mammogram

## 2022-06-21 NOTE — Telephone Encounter (Signed)
Dr. Etter Sjogren needed a urine specimen from patient but it was not done.  Spoke with patient and she was not given a cup at visit.  She is going to the bathroom quite often and would like it checked.  Her son will be by tomorrow to pick up cup for her and bring it back.  Future orders placed.

## 2022-06-21 NOTE — Assessment & Plan Note (Signed)
Well controlled, no changes to meds. Encouraged heart healthy diet such as the DASH diet and exercise as tolerated.  

## 2022-06-21 NOTE — Assessment & Plan Note (Signed)
Stable Check labs 

## 2022-06-21 NOTE — Patient Instructions (Addendum)

## 2022-06-21 NOTE — Assessment & Plan Note (Signed)
Stable  Off aricept due to side effects Pt sees neuro

## 2022-06-21 NOTE — Progress Notes (Signed)
Subjective:   By signing my name below, I, Carrie Hall, attest that this documentation has been prepared under the direction and in the presence of Carrie Held, DO. 06/21/2022.    Patient ID: Carrie Hall, female    DOB: July 02, 1933, 89 y.o.   MRN: [id number]  Chief Complaint  Patient presents with   Annual Exam    Pt state fasting     HPI Patient is in today for a comprehensive physical exam.   She is also f/u copd,   Social history: She states she is feeling well today.  Hearing: She states she will have to be evaluated for her hearing soon. In September she had a bilateral inner ear infection. Currently she complains of pruritus of her right ear. On initial examination there is cerumen buildup of the right ear. Subsequent right ear lavage was unsuccessful due to impacted cerumen.   Breathing: She has been using her inhaler daily. Her breathing has reportedly been stable.  Med Intolerance: When taking donepezil she developed side effects including diarrhea, loss of appetite, and LE muscle cramps. After she stopped taking the donepezil her symptoms resolved except for the loss of appetite.  Diet: She continues to complain of some appetite loss although she is eating. She believes she has lost some weight since her last visit. Wt Readings from Last 3 Encounters:  06/21/22 143 lb 9.6 oz (65.1 kg)  04/29/22 144 lb 12.8 oz (65.7 kg)  01/03/22 157 lb (71.2 kg)   Colonoscopy: Last completed 01/17/2008.  Dexa: Last completed 10/06/2017.  Pap Smear: Last completed 10/26/2009.  Mammogram: Last completed 03/13/2017 (Bilateral), 08/11/2017 (Right). She does not wish to update her mammogram at this time.  Immunizations: COVID vaccine received 08/26/2019. Tdap received 08/07/2017. She received a pneumonia vaccine 02/12/2018. Shingles (04/20/2021), and influenza (02/24/2022) immunizations are UTD.  Dermatology: She will follow up with her dermatologist in March.  Denies having any  fever, new muscle pain, joint pain, new moles, congestion, sinus pain, sore throat, chest pain, palpitations, cough, SOB, wheezing, n/v/d, constipation, blood in stool, dysuria, frequency, hematuria, at this time.  Past Medical History:  Diagnosis Date   Adenomatous colon polyp    Breast cancer ([place])    COPD (chronic obstructive pulmonary disease) ([place])    Diverticulosis    Hyperlipidemia    Internal hemorrhoids    Osteopenia    Thyroid disease     Past Surgical History:  Procedure Laterality Date   BREAST LUMPECTOMY Left    radiation only   BREAST SURGERY     colon polyps      Family History  Problem Relation Age of Onset   Alzheimer's disease Mother    Cancer Father        lung   Alzheimer's disease Maternal Grandmother    Rectal cancer Sister        in her 28s    Social History   Socioeconomic History   Marital status: Widowed    Spouse name: Not on file   Number of children: Not on file   Years of education: Not on file   Highest education level: Not on file  Occupational History   Not on file  Tobacco Use   Smoking status: Former    Packs/day: 0.50    Types: Cigarettes    Quit date: 12/06/2003    Years since quitting: 18.5   Smokeless tobacco: Never  Vaping Use   Vaping Use: Never used  Substance and Sexual Activity  Alcohol use: No   Drug use: No   Sexual activity: Never    Partners: Male  Other Topics Concern   Not on file  Social History Narrative   Pt lives alone in 1 story home   Has 2 sons that live nearby   9th grade education   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   Right handed    Social Determinants of Health   Financial Resource Strain: Low Risk  (11/18/2021)   Overall Financial Resource Strain (CARDIA)    Difficulty of Paying Living Expenses: Not hard at all  Food Insecurity: No Food Insecurity (11/26/2020)   Hunger Vital Sign    Worried About Running Out of Food in the Last Year: Never true    [place] in the Last  Year: Never true  Transportation Needs: No Transportation Needs (11/18/2021)   PRAPARE - Hydrologist (Medical): No    Lack of Transportation (Non-Medical): No  Physical Activity: Inactive (05/27/2021)   Exercise Vital Sign    Days of Exercise per Week: 0 days    Minutes of Exercise per Session: 0 min  Stress: Stress Concern Present (01/23/2020)   [name]    Feeling of Stress : To some extent  Social Connections: Moderately Isolated (11/18/2021)   Social Connection and Isolation Panel [NHANES]    Frequency of Communication with Friends and Family: Three times a week    Frequency of Social Gatherings with Friends and Family: Twice a week    Attends Religious Services: More than 4 times per year    Active Member of [employer] or Organizations: No    Attends Archivist Meetings: Never    Marital Status: Widowed  Intimate Partner Violence: Not At Risk (11/29/2021)   Humiliation, Afraid, Rape, and Kick questionnaire    Fear of Current or Ex-Partner: No    Emotionally Abused: No    Physically Abused: No    Sexually Abused: No    Outpatient Medications Prior to Visit  Medication Sig Dispense Refill   acetaminophen (TYLENOL) 325 MG tablet Take 650 mg by mouth every 8 (eight) hours as needed for moderate pain.     alendronate (FOSAMAX) 70 MG tablet TAKE 1 TABLET BY MOUTH ONCE WEEKLY BEFORE THE FIRST FOOD, BEVERAGE OR MEDICINE OF THE DAY WITH PLAIN WATER 12 tablet 3   atorvastatin (LIPITOR) 20 MG tablet Take 1 tablet (20 mg total) by mouth daily. 90 tablet 1   fluticasone-salmeterol (ADVAIR HFA) 115-21 MCG/ACT inhaler Inhale 2 puffs into the lungs 2 (two) times daily. Remember to rinse mouth with water after each use of Advair inhaler. 1 each 5   folic acid (FOLVITE) 1 MG tablet Take 1 mg by mouth daily.     levothyroxine (SYNTHROID) 88 MCG tablet Take 1 tablet (88 mcg total) by mouth daily  before breakfast. 90 tablet 3   methotrexate (RHEUMATREX) 2.5 MG tablet Take 12.5 mg by mouth once a week. On Wednesdays     Multiple Vitamins-Minerals (PRESERVISION AREDS 2 PO) Take 1 tablet by mouth 2 (two) times daily.     albuterol (VENTOLIN HFA) 108 (90 Base) MCG/ACT inhaler Inhale 1-2 puffs into the lungs every 6 (six) hours as needed for wheezing or shortness of breath. (Patient not taking: Reported on 05/13/2022) 18 g 0   ALPRAZolam (XANAX) 0.25 MG tablet Take 1 tablet (0.25 mg total) by mouth at bedtime as needed  for anxiety. (Patient not taking: Reported on 05/13/2022) 30 tablet 0   No facility-administered medications prior to visit.    Allergies  Allergen Reactions   Pneumococcal Vaccines Itching and Swelling   Donepezil Hcl Diarrhea    Review of Systems  Constitutional:  Negative for fever.  HENT:  Negative for congestion, sinus pain and sore throat.   Respiratory:  Negative for cough, shortness of breath and wheezing.   Cardiovascular:  Negative for chest pain and palpitations.  Gastrointestinal:  Negative for blood in stool, constipation, diarrhea, nausea and vomiting.  Genitourinary:  Negative for dysuria, frequency and hematuria.  Musculoskeletal:  Negative for joint pain and myalgias.  Skin:  Positive for itching (Right ear).       Objective:    Physical Exam Constitutional:      Appearance: Normal appearance.  HENT:     Head: Normocephalic and atraumatic.     Right Ear: External ear normal. There is impacted cerumen.     Left Ear: Tympanic membrane, ear canal and external ear normal.     Ears:     Comments: Cerumen buildup of right ear on initial exam. She subsequently underwent right ear lavage which was unsuccessful due to impacted cerumen. Unable to visualize right TM. Eyes:     Extraocular Movements: Extraocular movements intact.     Pupils: Pupils are equal, round, and reactive to light.  Cardiovascular:     Rate and Rhythm: Normal rate and regular  rhythm.     Heart sounds: Normal heart sounds. No murmur heard.    No gallop.  Pulmonary:     Effort: Pulmonary effort is normal. No respiratory distress.     Breath sounds: Normal breath sounds. No wheezing or rales.  Abdominal:     General: Bowel sounds are normal. There is no distension.     Palpations: Abdomen is soft.     Tenderness: There is no abdominal tenderness. There is no guarding.  Musculoskeletal:        General: Normal range of motion.  Skin:    General: Skin is warm and dry.  Neurological:     General: No focal deficit present.     Mental Status: She is alert and oriented to person, place, and time.  Psychiatric:        Mood and Affect: Mood normal.        Behavior: Behavior normal.     BP 110/80 (BP Location: Left Arm, Patient Position: Sitting, Cuff Size: Normal)   Pulse (!) 44   Temp 97.7 F (36.5 C) (Oral)   Resp 20   Ht '5\' 4"'$  (1.626 m)   Wt 143 lb 9.6 oz (65.1 kg)   SpO2 97%   BMI 24.65 kg/m  Wt Readings from Last 3 Encounters:  06/21/22 143 lb 9.6 oz (65.1 kg)  04/29/22 144 lb 12.8 oz (65.7 kg)  01/03/22 157 lb (71.2 kg)    Diabetic Foot Exam - Simple   No data filed    Lab Results  Component Value Date   WBC 6.3 12/09/2021   HGB 13.9 12/09/2021   HCT 42.7 12/09/2021   PLT 252.0 12/09/2021   GLUCOSE 93 12/09/2021   CHOL 180 12/09/2021   TRIG 151.0 (H) 12/09/2021   HDL 52.20 12/09/2021   LDLDIRECT 103.3 03/07/2014   LDLCALC 97 12/09/2021   ALT 12 12/09/2021   AST 15 12/09/2021   NA 140 12/09/2021   K 4.5 12/09/2021   CL 100 12/09/2021   CREATININE 0.71  12/09/2021   BUN 15 12/09/2021   CO2 34 (H) 12/09/2021   TSH 2.60 12/09/2021   HGBA1C 6.1 07/20/2012    Lab Results  Component Value Date   TSH 2.60 12/09/2021   Lab Results  Component Value Date   WBC 6.3 12/09/2021   HGB 13.9 12/09/2021   HCT 42.7 12/09/2021   MCV 92.1 12/09/2021   PLT 252.0 12/09/2021   Lab Results  Component Value Date   NA 140 12/09/2021   K 4.5  12/09/2021   CO2 34 (H) 12/09/2021   GLUCOSE 93 12/09/2021   BUN 15 12/09/2021   CREATININE 0.71 12/09/2021   BILITOT 0.7 12/09/2021   ALKPHOS 75 12/09/2021   AST 15 12/09/2021   ALT 12 12/09/2021   PROT 6.6 12/09/2021   ALBUMIN 4.2 12/09/2021   CALCIUM 9.5 12/09/2021   ANIONGAP 9 03/08/2021   GFR 77.24 12/09/2021   Lab Results  Component Value Date   CHOL 180 12/09/2021   Lab Results  Component Value Date   HDL 52.20 12/09/2021   Lab Results  Component Value Date   LDLCALC 97 12/09/2021   Lab Results  Component Value Date   TRIG 151.0 (H) 12/09/2021   Lab Results  Component Value Date   CHOLHDL 3 12/09/2021   Lab Results  Component Value Date   HGBA1C 6.1 07/20/2012       Assessment & Plan:   Problem List Items Addressed This Visit       Unprioritized   Preventative health care - Primary   Relevant Orders   CBC with Differential/Platelet   Comprehensive metabolic panel   Lipid panel   TSH   POCT Urinalysis Dipstick (Automated)   Osteopenia   Memory loss   Hypothyroidism    Stable Check labs       Relevant Orders   TSH   Hyperlipidemia LDL goal <100   Relevant Orders   Comprehensive metabolic panel   Lipid panel   Hearing loss of both ears   Relevant Orders   Ambulatory referral to ENT   Essential hypertension    Well controlled, no changes to meds. Encouraged heart healthy diet such as the DASH diet and exercise as tolerated.        Relevant Orders   CBC with Differential/Platelet   Comprehensive metabolic panel   Lipid panel   TSH   POCT Urinalysis Dipstick (Automated)   COPD with chronic bronchitis ([facility])    On albuterol as needed stable      Cerumen impaction   Relevant Orders   Ambulatory referral to ENT   BREAST CANCER, HX OF    Pt refusing mammogram      Aortic atherosclerosis ([place])    On statin Tolerating statin, encouraged heart healthy diet, avoid trans fats, minimize simple carbs and saturated fats. Increase  exercise as tolerated       Alzheimer's dementia without behavioral disturbance ([place])    Stable  Off aricept due to side effects Pt sees neuro        No orders of the defined types were placed in this encounter.   IAnn Held, DO, personally preformed the services described in this documentation.  All medical record entries made by the scribe were at my direction and in my presence.  I have reviewed the chart and discharge instructions (if applicable) and agree that the record reflects my personal performance and is accurate and complete. 06/21/2022.  I,Mathew Stumpf,acting as a Education administrator for [employer]  R Carollee Herter, DO.,have documented all relevant documentation on the behalf of Carrie Held, DO,as directed by  Carrie Held, DO while in the presence of Carrie Held, DO.   Carrie Held, DO

## 2022-06-21 NOTE — Assessment & Plan Note (Signed)
On albuterol as needed stable

## 2022-06-22 ENCOUNTER — Other Ambulatory Visit (INDEPENDENT_AMBULATORY_CARE_PROVIDER_SITE_OTHER): Payer: Medicare HMO

## 2022-06-22 DIAGNOSIS — R3915 Urgency of urination: Secondary | ICD-10-CM

## 2022-06-22 LAB — POCT URINALYSIS DIP (MANUAL ENTRY)
Bilirubin, UA: NEGATIVE
Blood, UA: NEGATIVE
Glucose, UA: NEGATIVE mg/dL
Ketones, POC UA: NEGATIVE mg/dL
Nitrite, UA: POSITIVE — AB
Protein Ur, POC: NEGATIVE mg/dL
Spec Grav, UA: 1.01 (ref 1.010–1.025)
Urobilinogen, UA: 0.2 E.U./dL
pH, UA: 6 (ref 5.0–8.0)

## 2022-06-22 NOTE — Addendum Note (Signed)
Addended by: Manuela Schwartz on: 06/22/2022 03:29 PM   Modules accepted: Orders

## 2022-06-22 NOTE — Addendum Note (Signed)
Addended by: Manuela Schwartz on: 06/22/2022 03:27 PM   Modules accepted: Orders

## 2022-06-23 ENCOUNTER — Other Ambulatory Visit: Payer: Self-pay | Admitting: Family Medicine

## 2022-06-23 DIAGNOSIS — N390 Urinary tract infection, site not specified: Secondary | ICD-10-CM

## 2022-06-23 MED ORDER — CEPHALEXIN 500 MG PO CAPS: 500.0000 mg | ORAL_CAPSULE | Freq: Two times a day (BID) | ORAL | 0 refills | Status: DC

## 2022-06-23 NOTE — Progress Notes (Signed)
Pt notified of results and rx resent into [facility] at [address].  Rx was originally sent into [facility].

## 2022-06-23 NOTE — Addendum Note (Signed)
Addended by: Kem Boroughs D on: 06/23/2022 03:49 PM   Modules accepted: Orders

## 2022-06-25 LAB — URINE CULTURE
MICRO NUMBER:: 14564445
SPECIMEN QUALITY:: ADEQUATE

## 2022-07-27 DIAGNOSIS — L408 Other psoriasis: Secondary | ICD-10-CM | POA: Diagnosis not present

## 2022-08-02 DIAGNOSIS — Z79899 Other long term (current) drug therapy: Secondary | ICD-10-CM | POA: Diagnosis not present

## 2022-08-15 ENCOUNTER — Other Ambulatory Visit: Payer: Self-pay | Admitting: Family Medicine

## 2022-08-18 ENCOUNTER — Other Ambulatory Visit: Payer: Self-pay | Admitting: Family Medicine

## 2022-08-18 DIAGNOSIS — F411 Generalized anxiety disorder: Secondary | ICD-10-CM

## 2022-08-30 ENCOUNTER — Other Ambulatory Visit: Payer: Self-pay | Admitting: Family Medicine

## 2022-08-30 DIAGNOSIS — F411 Generalized anxiety disorder: Secondary | ICD-10-CM

## 2022-08-30 MED ORDER — ALPRAZOLAM 0.25 MG PO TABS: 0.2500 mg | ORAL_TABLET | Freq: Every evening | ORAL | 0 refills | Status: DC | PRN

## 2022-08-30 NOTE — Telephone Encounter (Signed)
Requesting: alprazolam 0.25mg  Contract: No UDS: no Last Visit: 06/21/2022 Next Visit: 12/22/2022 Last Refill: 08/10/21   Please Advise

## 2022-08-30 NOTE — Addendum Note (Signed)
Addended by: Thelma Barge D on: 08/30/2022 11:27 AM   Modules accepted: Orders

## 2022-08-30 NOTE — Telephone Encounter (Signed)
Prescription Request  08/30/2022  Is this a "Controlled Substance" medicine? Yes  LOV: 06/21/2022  What is the name of the medication or equipment?   ALPRAZolam (XANAX) 0.25 MG tablet [[id number]]  DISCONTINUED   Have you contacted your pharmacy to request a refill? No   Which pharmacy would you like this sent to?   [facility] - [place], [place] - [address] [place] [address] Phone: [phone] Fax: [phone]  Patient notified that their request is being sent to the clinical staff for review and that they should receive a response within 2 business days.   Please advise at Mobile [phone] (mobile)

## 2022-09-06 DIAGNOSIS — H35033 Hypertensive retinopathy, bilateral: Secondary | ICD-10-CM | POA: Diagnosis not present

## 2022-09-06 DIAGNOSIS — H353133 Nonexudative age-related macular degeneration, bilateral, advanced atrophic without subfoveal involvement: Secondary | ICD-10-CM | POA: Diagnosis not present

## 2022-09-12 ENCOUNTER — Other Ambulatory Visit: Payer: Self-pay | Admitting: Family Medicine

## 2022-09-12 DIAGNOSIS — J410 Simple chronic bronchitis: Secondary | ICD-10-CM

## 2022-09-14 DIAGNOSIS — H43813 Vitreous degeneration, bilateral: Secondary | ICD-10-CM | POA: Diagnosis not present

## 2022-09-14 DIAGNOSIS — H353133 Nonexudative age-related macular degeneration, bilateral, advanced atrophic without subfoveal involvement: Secondary | ICD-10-CM | POA: Diagnosis not present

## 2022-09-26 IMAGING — DX DG CHEST 1V PORT
1 series · 1 of 1 positions shown · non-contrast
Comparison: 07/15/2016

CLINICAL DATA: Shortness of breath

EXAM:
PORTABLE CHEST 1 VIEW

[chest ap]
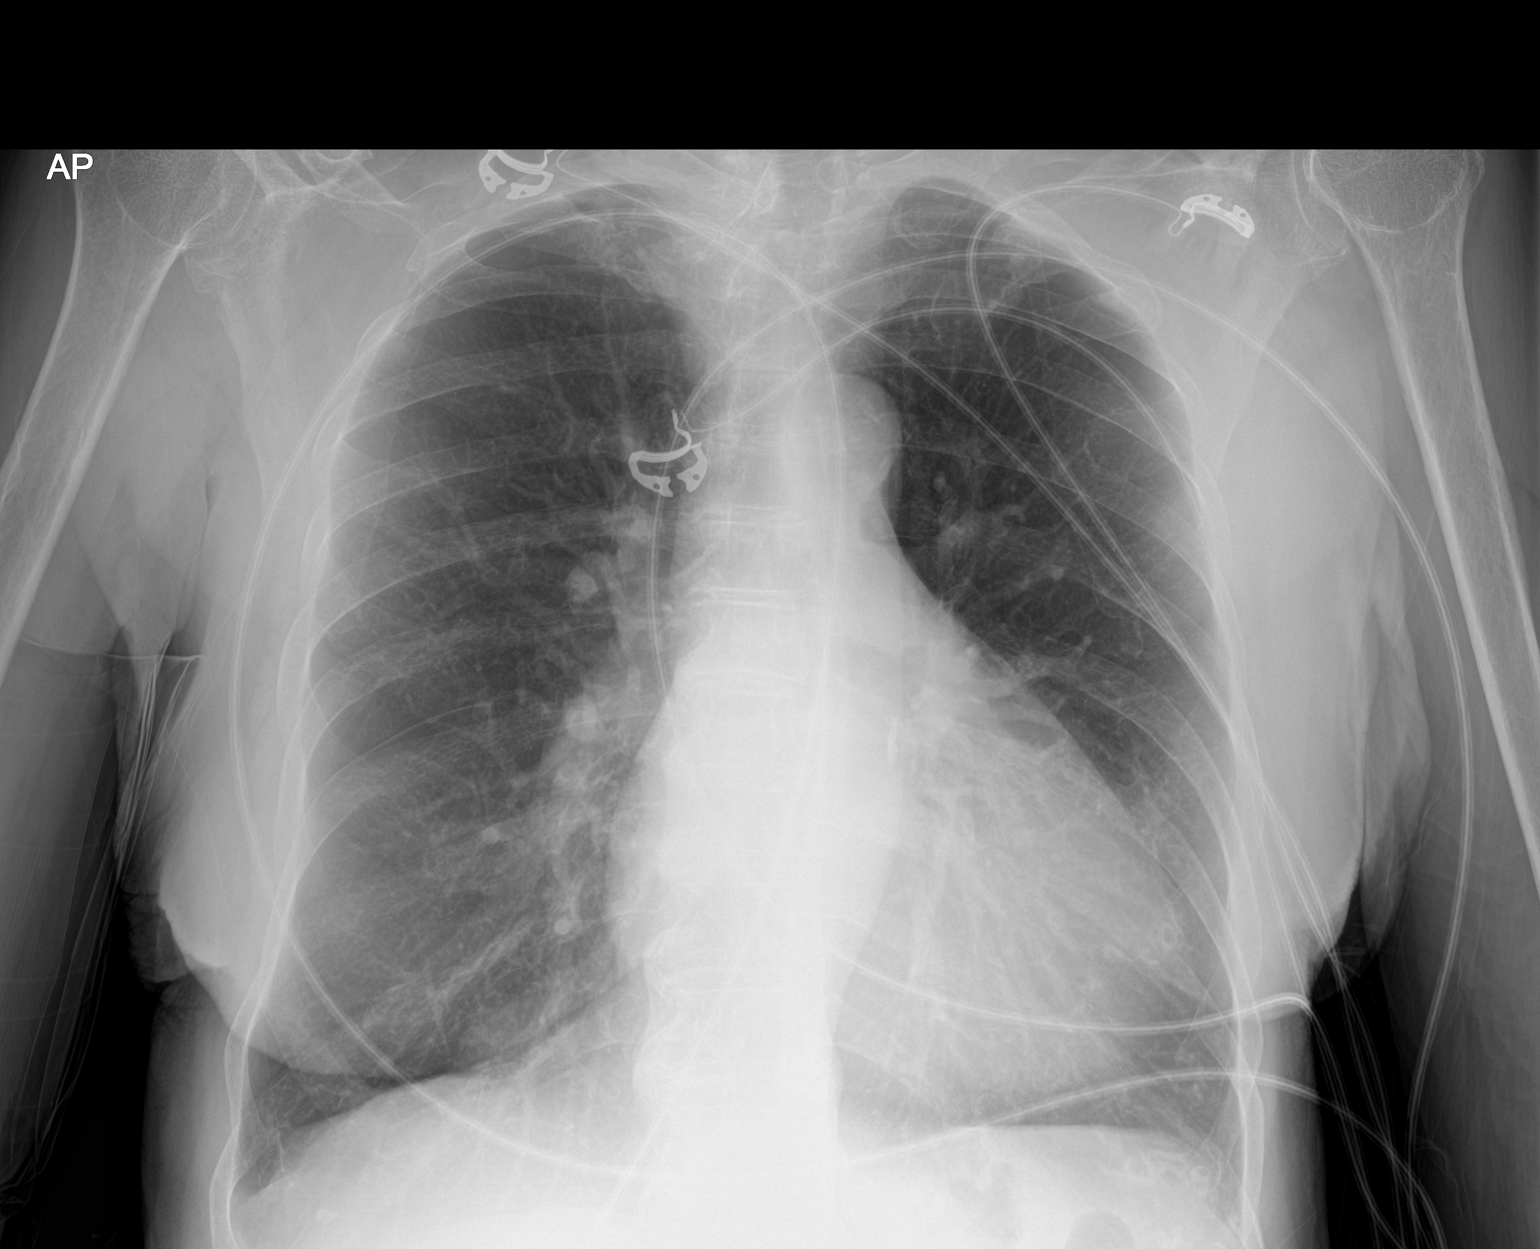

[1 of 1 positions shown; findings below may reference images not displayed]

FINDINGS: Cardiomegaly. Mild aortic tortuosity. There is no edema,
consolidation, effusion, or pneumothorax. Large lung volumes with
diaphragm flattening.
IMPRESSION: 1. No acute finding.
2. Cardiomegaly and hyperinflation.

## 2022-09-28 ENCOUNTER — Other Ambulatory Visit: Payer: Self-pay | Admitting: Family Medicine

## 2022-09-28 DIAGNOSIS — F411 Generalized anxiety disorder: Secondary | ICD-10-CM

## 2022-09-28 NOTE — Telephone Encounter (Signed)
Requesting: alprazolam 0.25mg  Contract: 01/01/2018 UDS: 01/01/2018 Last Visit: 06/21/22 Next Visit: 12/22/22 Last Refill: 08/30/22 #30 and 0RF  Please Advise

## 2022-11-10 ENCOUNTER — Other Ambulatory Visit: Payer: Self-pay | Admitting: Family Medicine

## 2022-11-10 DIAGNOSIS — E039 Hypothyroidism, unspecified: Secondary | ICD-10-CM

## 2022-11-17 ENCOUNTER — Other Ambulatory Visit: Payer: Self-pay | Admitting: Family Medicine

## 2022-11-17 DIAGNOSIS — F411 Generalized anxiety disorder: Secondary | ICD-10-CM

## 2022-11-17 NOTE — Telephone Encounter (Signed)
Requesting: alprazolam 0.25mg   Contract: 01/01/2018 UDS: 01/01/2018 Last Visit: 06/21/22 Next Visit: 12/22/22 Last Refill: 09/29/22 #30 and 0RF  Please Advise

## 2022-12-01 ENCOUNTER — Ambulatory Visit (INDEPENDENT_AMBULATORY_CARE_PROVIDER_SITE_OTHER): Payer: Medicare HMO | Admitting: Emergency Medicine

## 2022-12-01 VITALS — Ht 64.0 in | Wt 143.0 lb

## 2022-12-01 DIAGNOSIS — Z0000 Encounter for general adult medical examination without abnormal findings: Secondary | ICD-10-CM | POA: Diagnosis not present

## 2022-12-01 NOTE — Patient Instructions (Signed)
Carrie Hall , Thank you for taking time to come for your Medicare Wellness Visit. I appreciate your ongoing commitment to your health goals. Please review the following plan we discussed and let me know if I can assist you in the future.   Referrals/Orders/Follow-Ups/Clinician Recommendations: Make an appointment with a hearing (audiologist) doctor. Keep you plans to do the treatment for macular degeneration this winter.  This is a list of the screening recommended for you and due dates:  Health Maintenance  Topic Date Due   COVID-19 Vaccine (3 - Pfizer risk series) 09/23/2019   DEXA scan (bone density measurement)  10/07/2019   Mammogram  06/22/2023*   Flu Shot  12/08/2022   Medicare Annual Wellness Visit  12/01/2023   DTaP/Tdap/Td vaccine (3 - Td or Tdap) 08/08/2027   Pneumonia Vaccine  Completed   Zoster (Shingles) Vaccine  Completed   HPV Vaccine  Aged Out  *Topic was postponed. The date shown is not the original due date.    Advanced directives: Information on Advanced Care Planning can be found at [facility] Advance Health Care Directives Advance Health Care Directives ([email or web address]    Next Medicare Annual Wellness Visit scheduled for next year: Yes  Preventive Care 65 Years and Older, Female Preventive care refers to lifestyle choices and visits with your health care provider that can promote health and wellness. What does preventive care include? A yearly physical exam. This is also called an annual well check. Dental exams once or twice a year. Routine eye exams. Ask your health care provider how often you should have your eyes checked. Personal lifestyle choices, including: Daily care of your teeth and gums. Regular physical activity. Eating a healthy diet. Avoiding tobacco and drug use. Limiting alcohol use. Practicing safe sex. Taking low-dose aspirin every day. Taking vitamin and mineral supplements as recommended by your health care  provider. What happens during an annual well check? The services and screenings done by your health care provider during your annual well check will depend on your age, overall health, lifestyle risk factors, and family history of disease. Counseling  Your health care provider may ask you questions about your: Alcohol use. Tobacco use. Drug use. Emotional well-being. Home and relationship well-being. Sexual activity. Eating habits. History of falls. Memory and ability to understand (cognition). Work and work Astronomer. Reproductive health. Screening  You may have the following tests or measurements: Height, weight, and BMI. Blood pressure. Lipid and cholesterol levels. These may be checked every 5 years, or more frequently if you are over 15 years old. Skin check. Lung cancer screening. You may have this screening every year starting at age 59 if you have a 30-pack-year history of smoking and currently smoke or have quit within the past 15 years. Fecal occult blood test (FOBT) of the stool. You may have this test every year starting at age 33. Flexible sigmoidoscopy or colonoscopy. You may have a sigmoidoscopy every 5 years or a colonoscopy every 10 years starting at age 14. Hepatitis C blood test. Hepatitis B blood test. Sexually transmitted disease (STD) testing. Diabetes screening. This is done by checking your blood sugar (glucose) after you have not eaten for a while (fasting). You may have this done every 1-3 years. Bone density scan. This is done to screen for osteoporosis. You may have this done starting at age 53. Mammogram. This may be done every 1-2 years. Talk to your health care provider about how often you should have regular mammograms. Talk  with your health care provider about your test results, treatment options, and if necessary, the need for more tests. Vaccines  Your health care provider may recommend certain vaccines, such as: Influenza vaccine. This is  recommended every year. Tetanus, diphtheria, and acellular pertussis (Tdap, Td) vaccine. You may need a Td booster every 10 years. Zoster vaccine. You may need this after age 55. Pneumococcal 13-valent conjugate (PCV13) vaccine. One dose is recommended after age 32. Pneumococcal polysaccharide (PPSV23) vaccine. One dose is recommended after age 46. Talk to your health care provider about which screenings and vaccines you need and how often you need them. This information is not intended to replace advice given to you by your health care provider. Make sure you discuss any questions you have with your health care provider. Document Released: 05/22/2015 Document Revised: 01/13/2016 Document Reviewed: 02/24/2015 Elsevier Interactive Patient Education  2017 [employer].  Fall Prevention in the Home Falls can cause injuries. They can happen to people of all ages. There are many things you can do to make your home safe and to help prevent falls. What can I do on the outside of my home? Regularly fix the edges of walkways and driveways and fix any cracks. Remove anything that might make you trip as you walk through a door, such as a raised step or threshold. Trim any bushes or trees on the path to your home. Use bright outdoor lighting. Clear any walking paths of anything that might make someone trip, such as rocks or tools. Regularly check to see if handrails are loose or broken. Make sure that both sides of any steps have handrails. Any raised decks and porches should have guardrails on the edges. Have any leaves, snow, or ice cleared regularly. Use sand or salt on walking paths during winter. Clean up any spills in your garage right away. This includes oil or grease spills. What can I do in the bathroom? Use night lights. Install grab bars by the toilet and in the tub and shower. Do not use towel bars as grab bars. Use non-skid mats or decals in the tub or shower. If you need to sit down in  the shower, use a plastic, non-slip stool. Keep the floor dry. Clean up any water that spills on the floor as soon as it happens. Remove soap buildup in the tub or shower regularly. Attach bath mats securely with double-sided non-slip rug tape. Do not have throw rugs and other things on the floor that can make you trip. What can I do in the bedroom? Use night lights. Make sure that you have a light by your bed that is easy to reach. Do not use any sheets or blankets that are too big for your bed. They should not hang down onto the floor. Have a firm chair that has side arms. You can use this for support while you get dressed. Do not have throw rugs and other things on the floor that can make you trip. What can I do in the kitchen? Clean up any spills right away. Avoid walking on wet floors. Keep items that you use a lot in easy-to-reach places. If you need to reach something above you, use a strong step stool that has a grab bar. Keep electrical cords out of the way. Do not use floor polish or wax that makes floors slippery. If you must use wax, use non-skid floor wax. Do not have throw rugs and other things on the floor that can make you  trip. What can I do with my stairs? Do not leave any items on the stairs. Make sure that there are handrails on both sides of the stairs and use them. Fix handrails that are broken or loose. Make sure that handrails are as long as the stairways. Check any carpeting to make sure that it is firmly attached to the stairs. Fix any carpet that is loose or worn. Avoid having throw rugs at the top or bottom of the stairs. If you do have throw rugs, attach them to the floor with carpet tape. Make sure that you have a light switch at the top of the stairs and the bottom of the stairs. If you do not have them, ask someone to add them for you. What else can I do to help prevent falls? Wear shoes that: Do not have high heels. Have rubber bottoms. Are comfortable  and fit you well. Are closed at the toe. Do not wear sandals. If you use a stepladder: Make sure that it is fully opened. Do not climb a closed stepladder. Make sure that both sides of the stepladder are locked into place. Ask someone to hold it for you, if possible. Clearly mark and make sure that you can see: Any grab bars or handrails. First and last steps. Where the edge of each step is. Use tools that help you move around (mobility aids) if they are needed. These include: Canes. Walkers. Scooters. Crutches. Turn on the lights when you go into a dark area. Replace any light bulbs as soon as they burn out. Set up your furniture so you have a clear path. Avoid moving your furniture around. If any of your floors are uneven, fix them. If there are any pets around you, be aware of where they are. Review your medicines with your doctor. Some medicines can make you feel dizzy. This can increase your chance of falling. Ask your doctor what other things that you can do to help prevent falls. This information is not intended to replace advice given to you by your health care provider. Make sure you discuss any questions you have with your health care provider. Document Released: 02/19/2009 Document Revised: 10/01/2015 Document Reviewed: 05/30/2014 Elsevier Interactive Patient Education  2017 [employer].

## 2022-12-01 NOTE — Progress Notes (Signed)
Subjective:   Vital Signs: Unable to obtain new vitals due to this being a telehealth visit.   Carrie Hall is a 89 y.o. female who presents for Medicare Annual (Subsequent) preventive examination.  Visit Complete: Virtual  I connected with  Elton H Danowski on 12/01/22 by a audio enabled telemedicine application and verified that I am speaking with the correct person using two identifiers.  Patient Location: Home  Provider Location: Home Office  I discussed the limitations of evaluation and management by telemedicine. The patient expressed understanding and agreed to proceed.  Review of Systems     Cardiac Risk Factors include: advanced age (>83men, >8 women);hypertension;dyslipidemia;Other (see comment), Risk factor comments: Aortic atherosclerosis     Objective:    Today's Vitals   12/01/22 1301  Weight: 143 lb (64.9 kg)  Height: 5\' 4"  (1.626 m)   Body mass index is 24.55 kg/m.     12/01/2022    1:26 PM 11/29/2021   11:04 AM 06/14/2021    9:11 AM 03/10/2021    8:10 AM 03/08/2021    1:45 PM 12/07/2020    9:03 AM 11/26/2020    9:03 AM  Advanced Directives  Does Patient Have a Medical Advance Directive? No Yes No No No No No  Type of Furniture conservator/restorer;Living will       Copy of Healthcare Power of Attorney in Chart?  No - copy requested       Would patient like information on creating a medical advance directive? Yes (MAU/Ambulatory/Procedural Areas - Information given)    No - Patient declined  Yes (MAU/Ambulatory/Procedural Areas - Information given)    Current Medications (verified) Outpatient Encounter Medications as of 12/01/2022  Medication Sig   ADVAIR HFA 115-21 MCG/ACT inhaler INHALE TWO PUFFS BY MOUTH INTO LUNGS TWICE DAILY. RINSE MOUTH WITH water AFTER each USE   alendronate (FOSAMAX) 70 MG tablet TAKE 1 TABLET BY MOUTH ONCE WEEKLY BEFORE THE FIRST FOOD, BEVERAGE OR MEDICINE OF THE DAY WITH PLAIN WATER   ALPRAZolam (XANAX) 0.25 MG  tablet TAKE ONE TABLET BY MOUTH AT bedtime AS NEEDED FOR ANXIETY   atorvastatin (LIPITOR) 20 MG tablet TAKE ONE TABLET BY MOUTH ONCE DAILY   folic acid (FOLVITE) 1 MG tablet Take 1 mg by mouth daily.   levothyroxine (SYNTHROID) 88 MCG tablet TAKE ONE TABLET BY MOUTH ONCE DAILY BEFORE BREAKFAST   methotrexate (RHEUMATREX) 2.5 MG tablet Take 12.5 mg by mouth once a week. On Wednesdays   Multiple Vitamins-Minerals (PRESERVISION AREDS 2 PO) Take 1 tablet by mouth 2 (two) times daily.   acetaminophen (TYLENOL) 325 MG tablet Take 650 mg by mouth every 8 (eight) hours as needed for moderate pain. (Patient not taking: Reported on 12/01/2022)   albuterol (VENTOLIN HFA) 108 (90 Base) MCG/ACT inhaler Inhale 1-2 puffs into the lungs every 6 (six) hours as needed for wheezing or shortness of breath. (Patient not taking: Reported on 05/13/2022)   cephALEXin (KEFLEX) 500 MG capsule Take 1 capsule (500 mg total) by mouth 2 (two) times daily. (Patient not taking: Reported on 12/01/2022)   No facility-administered encounter medications on file as of 12/01/2022.    Allergies (verified) Pneumococcal vaccines and Donepezil hcl   History: Past Medical History:  Diagnosis Date   Adenomatous colon polyp    Breast cancer (HCC)    COPD (chronic obstructive pulmonary disease) (HCC)    Diverticulosis    Hyperlipidemia    Internal hemorrhoids    Osteopenia  Thyroid disease    Past Surgical History:  Procedure Laterality Date   BREAST LUMPECTOMY Left    radiation only   BREAST SURGERY     colon polyps     Family History  Problem Relation Age of Onset   Alzheimer's disease Mother    Cancer Father        lung   Alzheimer's disease Maternal Grandmother    Rectal cancer Sister        in her 64s   Social History   Socioeconomic History   Marital status: Widowed    Spouse name: Not on file   Number of children: 2   Years of education: Not on file   Highest education level: Not on file  Occupational  History   Occupation: retired - homemaker  Tobacco Use   Smoking status: Former    Current packs/day: 0.00    Average packs/day: 0.5 packs/day for 48.6 years (24.3 ttl pk-yrs)    Types: Cigarettes    Start date: 67    Quit date: 12/06/2003    Years since quitting: 19.0   Smokeless tobacco: Never  Vaping Use   Vaping status: Never Used  Substance and Sexual Activity   Alcohol use: No   Drug use: No   Sexual activity: Never    Partners: Male  Other Topics Concern   Not on file  Social History Narrative   Pt lives alone in 1 story home   Has 2 sons that live nearby   9th grade education   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   Right handed    Social Determinants of Health   Financial Resource Strain: Low Risk  (12/01/2022)   Overall Financial Resource Strain (CARDIA)    Difficulty of Paying Living Expenses: Not hard at all  Food Insecurity: No Food Insecurity (12/01/2022)   Hunger Vital Sign    Worried About Running Out of Food in the Last Year: Never true    Ran Out of Food in the Last Year: Never true  Transportation Needs: No Transportation Needs (12/01/2022)   PRAPARE - Administrator, Civil Service (Medical): No    Lack of Transportation (Non-Medical): No  Physical Activity: Insufficiently Active (12/01/2022)   Exercise Vital Sign    Days of Exercise per Week: 5 days    Minutes of Exercise per Session: 10 min  Stress: No Stress Concern Present (12/01/2022)   [employer] of Occupational Health - Occupational Stress Questionnaire    Feeling of Stress : Not at all  Social Connections: Moderately Isolated (12/01/2022)   Social Connection and Isolation Panel [NHANES]    Frequency of Communication with Friends and Family: Twice a week    Frequency of Social Gatherings with Friends and Family: More than three times a week    Attends Religious Services: More than 4 times per year    Active Member of [employer] or Organizations: No    Attends Tax inspector Meetings: Never    Marital Status: Widowed    Tobacco Counseling Counseling given: Not Answered   Clinical Intake:  Pre-visit preparation completed: Yes  Pain : No/denies pain     BMI - recorded: 24.55 Nutritional Status: BMI of 19-24  Normal Nutritional Risks: None Diabetes: No  How often do you need to have someone help you when you read instructions, pamphlets, or other written materials from your doctor or pharmacy?: 1 - Never  Interpreter Needed?: No  Information entered by ::  Tora Kindred, CMA   Activities of Daily Living    12/01/2022    1:05 PM  In your present state of health, do you have any difficulty performing the following activities:  Hearing? 1  Comment patient wants to see an audiologist  Vision? 0  Difficulty concentrating or making decisions? 0  Walking or climbing stairs? 1  Comment uses a cane sometimes  Dressing or bathing? 0  Doing errands, shopping? 1  Comment patient doesn't drive, son takes her to appointments  Preparing Food and eating ? N  Using the Toilet? N  In the past six months, have you accidently leaked urine? Y  Comment wears depends  Do you have problems with loss of bowel control? N  Managing your Medications? N  Managing your Finances? Y  Comment son takes care of finances  Housekeeping or managing your Housekeeping? N    Patient Care Team: Zola Button, Grayling Congress, DO as PCP - General Karel Jarvis Lesle Chris, MD as Consulting Physician (Neurology) Hollar, Ronal Fear, MD as Referring Physician (Dermatology) Henrene Pastor, RPH-CPP (Pharmacist)  Indicate any recent Medical Services you may have received from other than [facility] providers in the past year (date may be approximate).     Assessment:   This is a routine wellness examination for Carrie Hall.  Hearing/Vision screen Hearing Screening - Comments:: Hearing difficulty, wants to get tested  Dietary issues and exercise activities discussed:     Goals Addressed                This Visit's Progress     Patient Stated (pt-stated)        Wants to get treatment for macular degeneration      Depression Screen    12/01/2022    1:22 PM 06/21/2022   10:34 AM 11/29/2021   11:08 AM 11/27/2020    8:21 AM 11/26/2020    9:06 AM 11/13/2019    2:03 PM 05/07/2019    8:58 AM  PHQ 2/9 Scores  PHQ - 2 Score 0 0 0 0 0 0 0  PHQ- 9 Score 0          Fall Risk    12/01/2022    1:28 PM 06/21/2022   10:34 AM 11/29/2021   11:06 AM 06/14/2021    9:11 AM 06/10/2021    9:30 AM  Fall Risk   Falls in the past year? 1 0 0 1 1  Number falls in past yr: 0 0 0 1 0  Injury with Fall? 0 0 0 0 1  Risk for fall due to : History of fall(s);Impaired balance/gait    Impaired balance/gait;Impaired mobility  Follow up  Falls evaluation completed Falls prevention discussed      MEDICARE RISK AT HOME:  Medicare Risk at Home - 12/01/22 1330     Any stairs in or around the home? Yes    If so, are there any without handrails? No    Home free of loose throw rugs in walkways, pet beds, electrical cords, etc? No    Adequate lighting in your home to reduce risk of falls? Yes    Life alert? No    Use of a cane, walker or w/c? Yes   sometimes uses a cane   Grab bars in the bathroom? Yes    Shower chair or bench in shower? No    Elevated toilet seat or a handicapped toilet? Yes             TIMED UP  AND GO:  Was the test performed?  No    Cognitive Function:    06/14/2021    8:00 PM 03/05/2020    8:00 AM 09/02/2019    8:00 AM 11/01/2017   10:00 AM 10/27/2017    8:19 AM  MMSE - Mini Mental State Exam  Orientation to time 5 4 3 5 4   Orientation to Place 4 4 4 4 5   Registration 3 3 3 3 3   Attention/ Calculation 0 2 0 3 3  Recall 3 3 3 1 1   Language- name 2 objects 2 2 2 2 2   Language- repeat 1 0 1 1 1   Language- follow 3 step command 3 3 2 3 3   Language- read & follow direction 1 1 1 1 1   Write a sentence 1 0 0 1 1  Copy design 1 1 1 1 1   Total score 24 23 20 25 25        01/31/2019    9:00 AM 06/22/2018   12:00 PM  Montreal Cognitive Assessment   Visuospatial/ Executive (0/5)  3  Naming (0/3)  3  Attention: Read list of digits (0/2) 1 1  Attention: Read list of letters (0/1) 0 0  Attention: Serial 7 subtraction starting at 100 (0/3) 0 0  Language: Repeat phrase (0/2) 1 0  Language : Fluency (0/1) 0 0  Abstraction (0/2) 0 0  Delayed Recall (0/5) 3 0  Orientation (0/6) 4 6  Total  13  Adjusted Score (based on education)  14      12/01/2022    1:33 PM 11/13/2019    1:59 PM  6CIT Screen  What Year? 0 points 0 points  What month? 0 points 0 points  What time? 0 points 0 points  Count back from 20 0 points 0 points  Months in reverse 4 points --  Repeat phrase 0 points 0 points  Total Score 4 points     Immunizations Immunization History  Administered Date(s) Administered   Fluad Quad(high Dose 65+) 03/11/2019, 03/23/2020, 02/02/2021, 02/24/2022   Influenza Split 03/15/2011   Influenza Whole 03/21/2007, 02/04/2008, 02/25/2009, 03/11/2010, 02/07/2012   Influenza, High Dose Seasonal PF 03/07/2014, 01/28/2016, 02/22/2017, 01/13/2018   Influenza,inj,Quad PF,6+ Mos 02/14/2013, 02/26/2015   PFIZER(Purple Top)SARS-COV-2 Vaccination 08/01/2019, 08/26/2019   Pneumococcal Conjugate-13 03/07/2014   Pneumococcal Polysaccharide-23 05/15/2006, 12/11/2007, 02/12/2018   Td 02/24/2004   Tdap 08/07/2017   Zoster Recombinant(Shingrix) 12/07/2020, 04/20/2021   Zoster, Live 11/02/2009    TDAP status: Up to date  Flu Vaccine status: Up to date  Pneumococcal vaccine status: Up to date  Covid-19 vaccine status: Information provided on how to obtain vaccines.   Qualifies for Shingles Vaccine? Yes   Zostavax completed Yes   Shingrix Completed?: Yes  Screening Tests Health Maintenance  Topic Date Due   COVID-19 Vaccine (3 - Pfizer risk series) 09/23/2019   DEXA SCAN  10/07/2019   MAMMOGRAM  06/22/2023 (Originally 03/13/2018)   INFLUENZA VACCINE   12/08/2022   Medicare Annual Wellness (AWV)  12/01/2023   DTaP/Tdap/Td (3 - Td or Tdap) 08/08/2027   Pneumonia Vaccine 68+ Years old  Completed   Zoster Vaccines- Shingrix  Completed   HPV VACCINES  Aged Out    Health Maintenance  Health Maintenance Due  Topic Date Due   COVID-19 Vaccine (3 - Pfizer risk series) 09/23/2019   DEXA SCAN  10/07/2019    Colorectal cancer screening: No longer required.   Mammogram status: No longer required due to age.  Bone Density status: Completed 10/06/17. Results reflect: Bone density results: OSTEOPOROSIS. Repeat every 2 years.  Lung Cancer Screening: (Low Dose CT Chest recommended if Age 43-80 years, 20 pack-year currently smoking OR have quit w/in 15years.) does not qualify.   Lung Cancer Screening Referral: n/a  Additional Screening:  Hepatitis C Screening: does not qualify; Completed never  Dental Screening: Recommended annual dental exams for proper oral hygiene  Community Resource Referral / Chronic Care Management: CRR required this visit?  No   CCM required this visit?  No     Plan:     I have personally reviewed and noted the following in the patient's chart:   Medical and social history Use of alcohol, tobacco or illicit drugs  Current medications and supplements including opioid prescriptions. Patient is not currently taking opioid prescriptions. Functional ability and status Nutritional status Physical activity Advanced directives List of other physicians Hospitalizations, surgeries, and ER visits in previous 12 months Vitals Screenings to include cognitive, depression, and falls Referrals and appointments  In addition, I have reviewed and discussed with patient certain preventive protocols, quality metrics, and best practice recommendations. A written personalized care plan for preventive services as well as general preventive health recommendations were provided to patient.     Tora Kindred, CMA   12/01/2022    After Visit Summary: (MyChart) Due to this being a telephonic visit, the after visit summary with patients personalized plan was offered to patient via MyChart   Nurse Notes:  6 CIT Score - 4

## 2022-12-07 ENCOUNTER — Encounter (INDEPENDENT_AMBULATORY_CARE_PROVIDER_SITE_OTHER): Payer: Self-pay

## 2022-12-10 IMAGING — CR DG LUMBAR SPINE COMPLETE 4+V
5 series · 5 of 5 positions shown · non-contrast
Comparison: CT 10/23/2017.

CLINICAL DATA: Right-sided back pain.

EXAM:
LUMBAR SPINE - COMPLETE 4+ VIEW

[t l-spine a.p.]
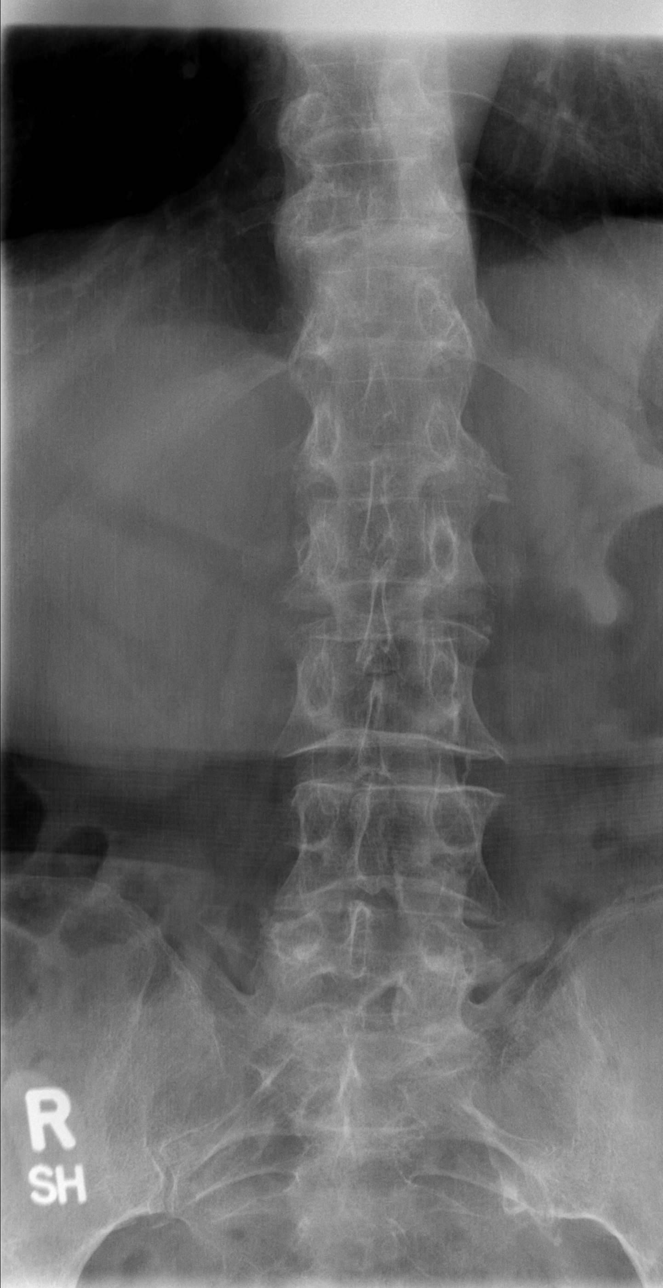

[t l-spine oblique exposure (1 of 2)]
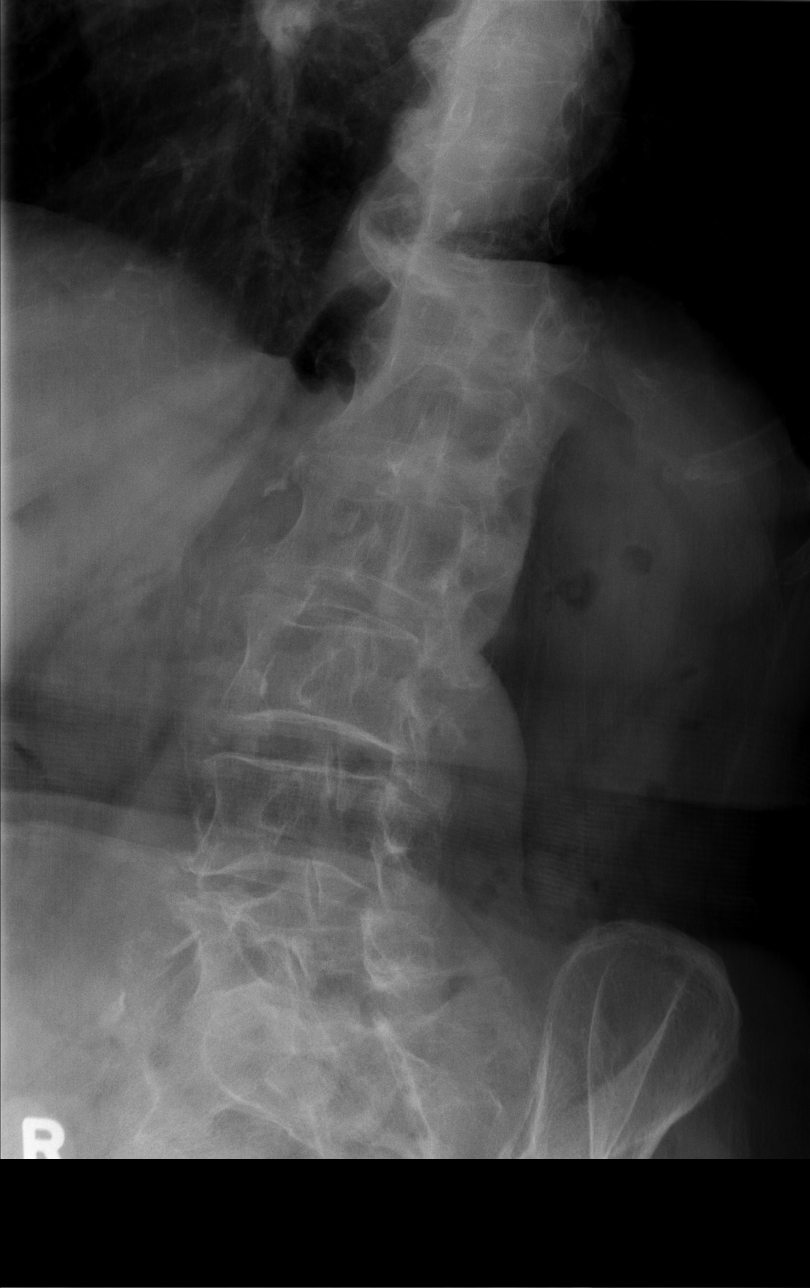

[t l-spine oblique exposure (2 of 2)]
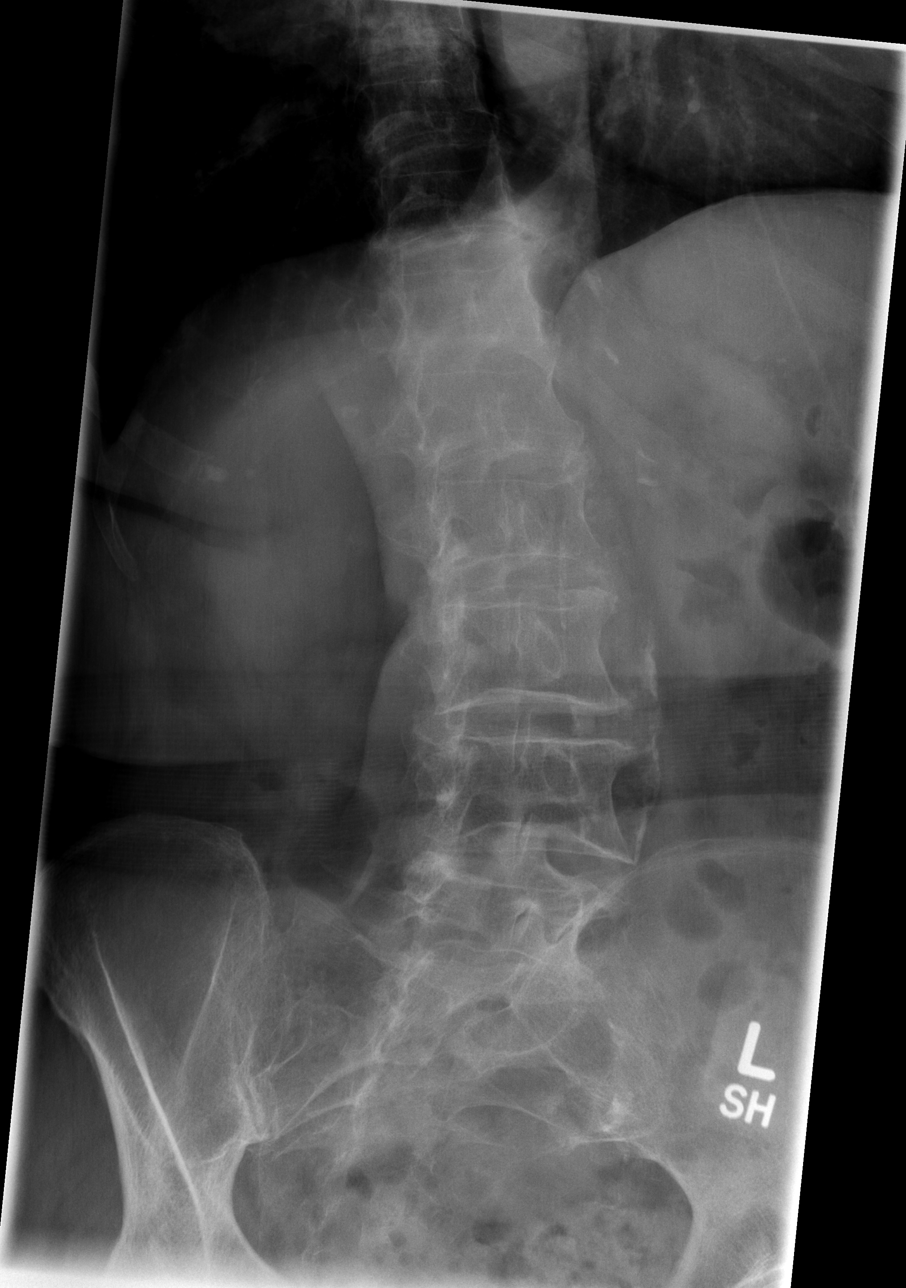

[t l-spine lat]
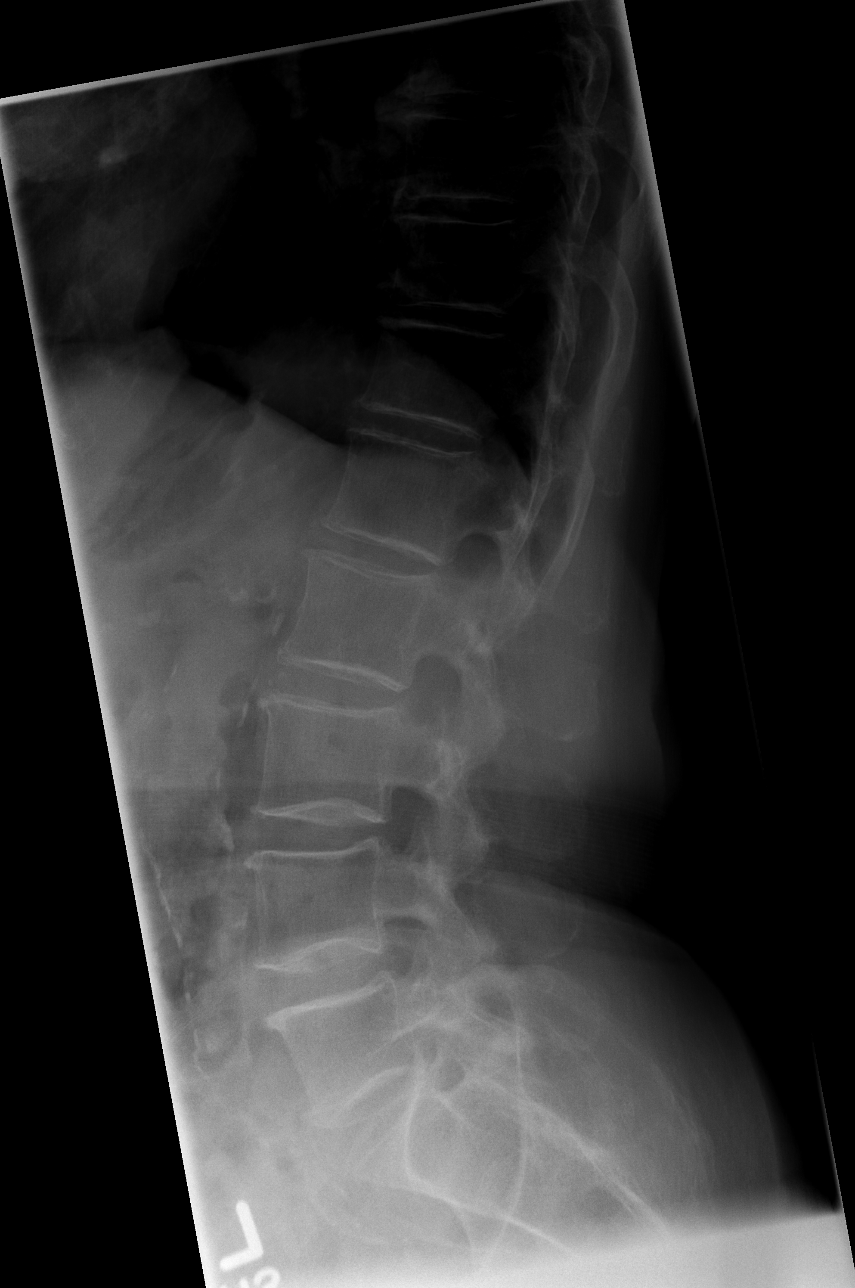

[t l-spine l5-s1 spot]
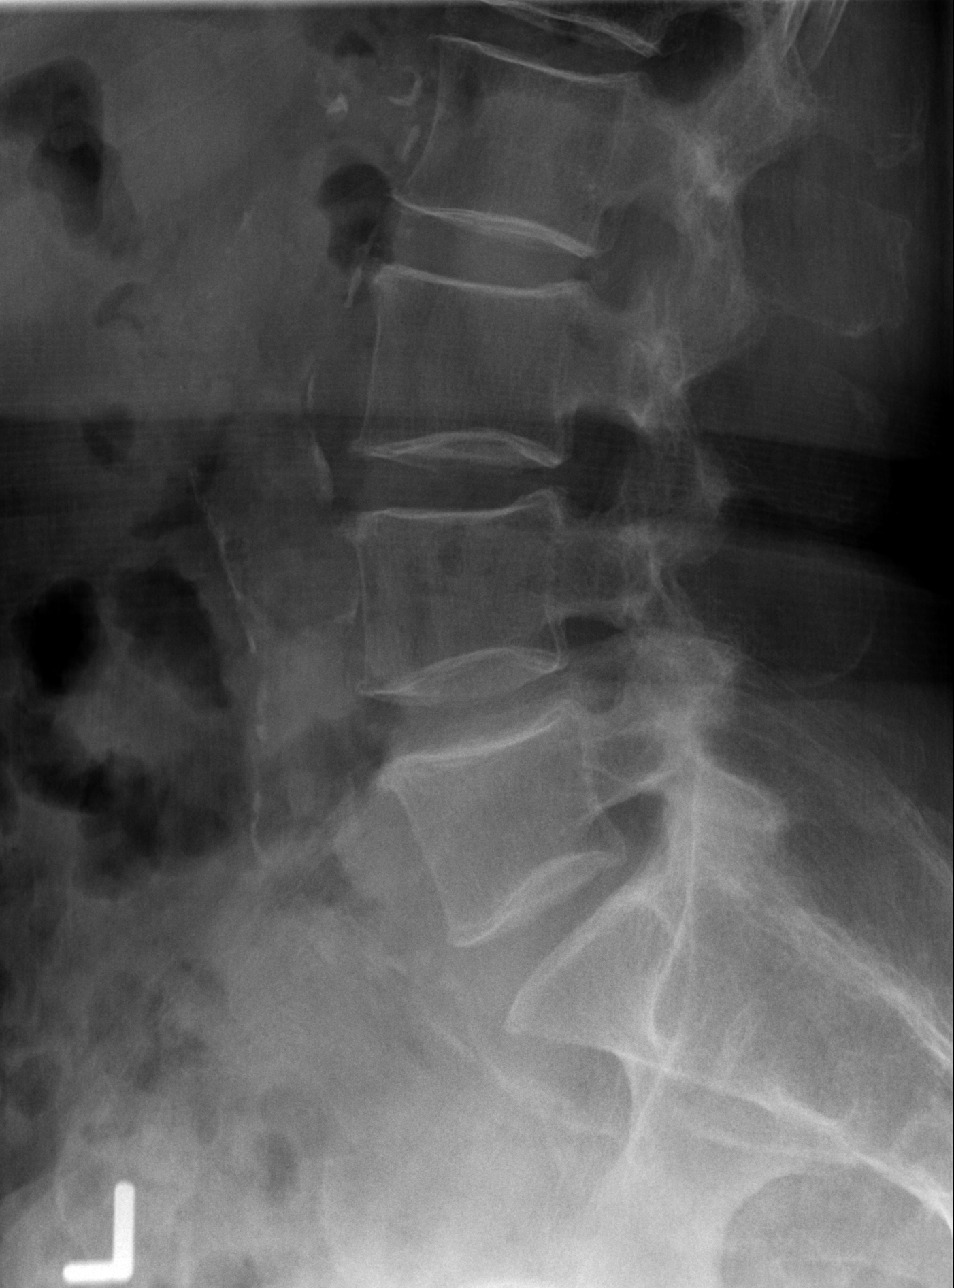

[5 of 5 positions shown; findings below may reference images not displayed]

FINDINGS: Diffuse multilevel degenerative change. No acute bony abnormality.
No evidence of fracture. Aortoiliac atherosclerotic vascular
calcification.
IMPRESSION: 1. Diffuse multilevel degenerative change. No acute bony
abnormality.

2.  Aortoiliac atherosclerotic vascular disease.

## 2022-12-21 ENCOUNTER — Other Ambulatory Visit: Payer: Self-pay | Admitting: Family Medicine

## 2022-12-21 DIAGNOSIS — F411 Generalized anxiety disorder: Secondary | ICD-10-CM

## 2022-12-21 NOTE — Telephone Encounter (Signed)
Requesting: alprazolam 0.25mg   Contract: 01/01/2018 UDS: 01/01/2018 Last Visit: 06/21/22 Next Visit: 12/22/22 Last Refill: 11/17/2022 #30 and 0RF   Please Advise

## 2022-12-22 ENCOUNTER — Ambulatory Visit (INDEPENDENT_AMBULATORY_CARE_PROVIDER_SITE_OTHER): Payer: Medicare HMO | Admitting: Family Medicine

## 2022-12-22 ENCOUNTER — Encounter: Payer: Self-pay | Admitting: Family Medicine

## 2022-12-22 VITALS — BP 140/80 | HR 68 | Temp 98.5°F | Resp 18 | Ht 64.0 in | Wt 141.2 lb

## 2022-12-22 DIAGNOSIS — G309 Alzheimer's disease, unspecified: Secondary | ICD-10-CM | POA: Diagnosis not present

## 2022-12-22 DIAGNOSIS — F0280 Dementia in other diseases classified elsewhere without behavioral disturbance: Secondary | ICD-10-CM

## 2022-12-22 DIAGNOSIS — E785 Hyperlipidemia, unspecified: Secondary | ICD-10-CM

## 2022-12-22 DIAGNOSIS — E039 Hypothyroidism, unspecified: Secondary | ICD-10-CM

## 2022-12-22 DIAGNOSIS — Z79899 Other long term (current) drug therapy: Secondary | ICD-10-CM | POA: Diagnosis not present

## 2022-12-22 DIAGNOSIS — R319 Hematuria, unspecified: Secondary | ICD-10-CM

## 2022-12-22 DIAGNOSIS — F411 Generalized anxiety disorder: Secondary | ICD-10-CM

## 2022-12-22 DIAGNOSIS — N390 Urinary tract infection, site not specified: Secondary | ICD-10-CM | POA: Diagnosis not present

## 2022-12-22 DIAGNOSIS — I10 Essential (primary) hypertension: Secondary | ICD-10-CM

## 2022-12-22 DIAGNOSIS — H9193 Unspecified hearing loss, bilateral: Secondary | ICD-10-CM

## 2022-12-22 LAB — CBC WITH DIFFERENTIAL/PLATELET
Basophils Absolute: 0.1 10*3/uL (ref 0.0–0.1)
Basophils Relative: 0.7 % (ref 0.0–3.0)
Eosinophils Absolute: 0.1 10*3/uL (ref 0.0–0.7)
Eosinophils Relative: 1.6 % (ref 0.0–5.0)
HCT: 42.9 % (ref 36.0–46.0)
Hemoglobin: 13.7 g/dL (ref 12.0–15.0)
Lymphocytes Relative: 18.7 % (ref 12.0–46.0)
Lymphs Abs: 1.4 10*3/uL (ref 0.7–4.0)
MCHC: 31.9 g/dL (ref 30.0–36.0)
MCV: 93.3 fl (ref 78.0–100.0)
Monocytes Absolute: 0.8 10*3/uL (ref 0.1–1.0)
Monocytes Relative: 10 % (ref 3.0–12.0)
Neutro Abs: 5.3 10*3/uL (ref 1.4–7.7)
Neutrophils Relative %: 69 % (ref 43.0–77.0)
Platelets: 265 10*3/uL (ref 150.0–400.0)
RBC: 4.6 Mil/uL (ref 3.87–5.11)
RDW: 14.7 % (ref 11.5–15.5)
WBC: 7.7 10*3/uL (ref 4.0–10.5)

## 2022-12-22 LAB — COMPREHENSIVE METABOLIC PANEL
ALT: 11 U/L (ref 0–35)
AST: 14 U/L (ref 0–37)
Albumin: 4.2 g/dL (ref 3.5–5.2)
Alkaline Phosphatase: 70 U/L (ref 39–117)
BUN: 16 mg/dL (ref 6–23)
CO2: 34 mEq/L — ABNORMAL HIGH (ref 19–32)
Calcium: 9.6 mg/dL (ref 8.4–10.5)
Chloride: 96 mEq/L (ref 96–112)
Creatinine, Ser: 0.7 mg/dL (ref 0.40–1.20)
GFR: 78 mL/min (ref >60.00)
Glucose, Bld: 98 mg/dL (ref 70–99)
Potassium: 4.2 mEq/L (ref 3.5–5.1)
Sodium: 136 mEq/L (ref 135–145)
Total Bilirubin: 0.7 mg/dL (ref 0.2–1.2)
Total Protein: 6.5 g/dL (ref 6.0–8.3)

## 2022-12-22 LAB — POC URINALSYSI DIPSTICK (AUTOMATED)
Bilirubin, UA: NEGATIVE
Blood, UA: NEGATIVE
Glucose, UA: NEGATIVE
Ketones, UA: NEGATIVE
Nitrite, UA: POSITIVE
Protein, UA: NEGATIVE
Spec Grav, UA: <=1.005 — AB (ref 1.010–1.025)
Urobilinogen, UA: 0.2 E.U./dL
pH, UA: 7 (ref 5.0–8.0)

## 2022-12-22 LAB — LIPID PANEL
Cholesterol: 190 mg/dL (ref 0–200)
HDL: 59 mg/dL (ref >39.00)
LDL Cholesterol: 106 mg/dL — ABNORMAL HIGH (ref 0–99)
NonHDL: 131.02
Total CHOL/HDL Ratio: 3
Triglycerides: 123 mg/dL (ref 0.0–149.0)
VLDL: 24.6 mg/dL (ref 0.0–40.0)

## 2022-12-22 LAB — TSH: TSH: 3.51 u[IU]/mL (ref 0.35–5.50)

## 2022-12-22 MED ORDER — CEPHALEXIN 500 MG PO CAPS: 500.0000 mg | ORAL_CAPSULE | Freq: Two times a day (BID) | ORAL | 0 refills | Status: DC

## 2022-12-22 NOTE — Assessment & Plan Note (Signed)
Refer to ent 

## 2022-12-22 NOTE — Assessment & Plan Note (Signed)
Encourage heart healthy diet such as MIND or DASH diet, increase exercise, avoid trans fats, simple carbohydrates and processed foods, consider a krill or fish or flaxseed oil cap daily.  °

## 2022-12-22 NOTE — Assessment & Plan Note (Signed)
stable °

## 2022-12-22 NOTE — Assessment & Plan Note (Signed)
Well controlled, no changes to meds. Encouraged heart healthy diet such as the DASH diet and exercise as tolerated.  °

## 2022-12-22 NOTE — Assessment & Plan Note (Signed)
Chek ua today

## 2022-12-22 NOTE — Assessment & Plan Note (Signed)
Check labs  Con't synthroid 

## 2022-12-22 NOTE — Progress Notes (Signed)
Established Patient Office Visit  Subjective   Patient ID: Carrie Hall, female    DOB: 01/16/1934  Age: 89 y.o. MRN: [id number]  Chief Complaint  Patient presents with   Hypothyroidism   Hyperlipidemia   Follow-up    HPI Discussed the use of AI scribe software for clinical note transcription with the patient, who gave verbal consent to proceed.  History of Present Illness   The patient, with a history of psoriasis and normal thyroid function, presents with intermittent diarrhea, which she attributes to certain foods and vitamins. She reports that the frequency has decreased to a couple of times a week and that she has a reduced appetite. She also reports difficulty walking.  The patient also reports a failed attempt to get a hearing aid due to an unattended appointment. She expresses frustration and confusion about the situation.  The patient had been experiencing leg aches and other side effects from a 'memory pill,' which she discontinued. Since stopping the medication, the leg aches have resolved and the diarrhea has improved. She reports feeling significantly better off the medication, as it had previously caused her to feel unwell and lethargic.  The patient also reports increased urinary frequency, which she attributes to age. She requests a urine test to investigate this symptom.  The patient also mentions anxiety, particularly during storms, which she manages with an as-needed anxiety medication. She reports that she does not need to take the medication often.      Patient Active Problem List   Diagnosis Date Noted   Osteopenia 06/21/2022   Alzheimer's dementia without behavioral disturbance (HCC) 06/21/2022   Fall 03/19/2021   Frequent urination 11/27/2020   COPD exacerbation (HCC) 11/27/2020   Aortic atherosclerosis (HCC) 08/12/2020   Oliguria 08/11/2020   Acute right-sided low back pain without sciatica 08/11/2020   Psoriasis 11/18/2019   Essential hypertension  02/12/2018   Generalized anxiety disorder 01/01/2018   Mild cognitive impairment 11/10/2017   Elevated LFTs 10/04/2017   Loss of weight 10/04/2017   Loose stools 10/04/2017   Weight loss, non-intentional 08/07/2017   Preventative health care 08/07/2017   Cerumen impaction 08/25/2015   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   Memory loss 08/25/2015   KNEE PAIN, LEFT 05/27/2010   DYSURIA 05/27/2010   POSTMENOPAUSAL STATUS 04/23/2010   CERVICAL POLYP 10/26/2009   HEMATURIA, HX OF 10/26/2009   DIZZINESS 06/11/2009   FOOT, PAIN 11/17/2008   OTHER ABNORMAL BLOOD CHEMISTRY 11/17/2008   TINEA CRURIS 09/15/2008   Urinary tract infection with hematuria 09/15/2008   COLONIC POLYPS, ADENOMATOUS, HX OF 01/16/2008   HEMOCCULT POSITIVE STOOL 12/11/2007   BREAST CANCER, HX OF 12/11/2007   Hypothyroidism 12/07/2007   Hyperlipidemia LDL goal <100 12/07/2007   COPD with chronic bronchitis (HCC) 12/07/2007   Disorder of bone and articular cartilage 12/07/2007   URI 03/13/2007   HX, URINARY INFECTION 01/09/2007   Past Medical History:  Diagnosis Date   Adenomatous colon polyp    Breast cancer (HCC)    COPD (chronic obstructive pulmonary disease) (HCC)    Diverticulosis    Hyperlipidemia    Internal hemorrhoids    Osteopenia    Thyroid disease    Past Surgical History:  Procedure Laterality Date   BREAST LUMPECTOMY Left    radiation only   BREAST SURGERY     colon polyps     Social History   Tobacco Use   Smoking status: Former    Current packs/day: 0.00    Average  packs/day: 0.5 packs/day for 48.6 years (24.3 ttl pk-yrs)    Types: Cigarettes    Start date: 89    Quit date: 12/06/2003    Years since quitting: 19.0   Smokeless tobacco: Never  Vaping Use   Vaping status: Never Used  Substance Use Topics   Alcohol use: No   Drug use: No   Social History   Socioeconomic History   Marital status: Widowed    Spouse name: Not on file   Number of children: 2   Years of  education: Not on file   Highest education level: Not on file  Occupational History   Occupation: retired - homemaker  Tobacco Use   Smoking status: Former    Current packs/day: 0.00    Average packs/day: 0.5 packs/day for 48.6 years (24.3 ttl pk-yrs)    Types: Cigarettes    Start date: 89    Quit date: 12/06/2003    Years since quitting: 19.0   Smokeless tobacco: Never  Vaping Use   Vaping status: Never Used  Substance and Sexual Activity   Alcohol use: No   Drug use: No   Sexual activity: Never    Partners: Male  Other Topics Concern   Not on file  Social History Narrative   Pt lives alone in 1 story home   Has 2 sons that live nearby   9th grade education   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   Right handed    Social Determinants of Health   Financial Resource Strain: Low Risk  (12/01/2022)   Overall Financial Resource Strain (CARDIA)    Difficulty of Paying Living Expenses: Not hard at all  Food Insecurity: No Food Insecurity (12/01/2022)   Hunger Vital Sign    Worried About Running Out of Food in the Last Year: Never true    Ran Out of Food in the Last Year: Never true  Transportation Needs: No Transportation Needs (12/01/2022)   PRAPARE - Administrator, Civil Service (Medical): No    Lack of Transportation (Non-Medical): No  Physical Activity: Insufficiently Active (12/01/2022)   Exercise Vital Sign    Days of Exercise per Week: 5 days    Minutes of Exercise per Session: 10 min  Stress: No Stress Concern Present (12/01/2022)   [employer] of Occupational Health - Occupational Stress Questionnaire    Feeling of Stress : Not at all  Social Connections: Moderately Isolated (12/01/2022)   Social Connection and Isolation Panel [NHANES]    Frequency of Communication with Friends and Family: Twice a week    Frequency of Social Gatherings with Friends and Family: More than three times a week    Attends Religious Services: More than 4 times per  year    Active Member of [employer] or Organizations: No    Attends Banker Meetings: Never    Marital Status: Widowed  Intimate Partner Violence: Not At Risk (12/01/2022)   Humiliation, Afraid, Rape, and Kick questionnaire    Fear of Current or Ex-Partner: No    Emotionally Abused: No    Physically Abused: No    Sexually Abused: No   Family Status  Relation Name Status   Mother  Deceased   Father  Deceased   MGM  Deceased   Sister  Deceased  No partnership data on file   Family History  Problem Relation Age of Onset   Alzheimer's disease Mother    Cancer Father  lung   Alzheimer's disease Maternal Grandmother    Rectal cancer Sister        in her 21s   Allergies  Allergen Reactions   Pneumococcal Vaccines Itching and Swelling   Donepezil Hcl Diarrhea      Review of Systems  Constitutional:  Negative for fever and malaise/fatigue.  HENT:  Negative for congestion.   Eyes:  Negative for blurred vision.  Respiratory:  Negative for shortness of breath.   Cardiovascular:  Negative for chest pain, palpitations and leg swelling.  Gastrointestinal:  Negative for abdominal pain, blood in stool and nausea.  Genitourinary:  Negative for dysuria and frequency.  Musculoskeletal:  Negative for falls.  Skin:  Negative for rash.  Neurological:  Negative for dizziness, loss of consciousness and headaches.  Endo/Heme/Allergies:  Negative for environmental allergies.  Psychiatric/Behavioral:  Negative for depression. The patient is not nervous/anxious.       Objective:     BP (!) 140/80 (BP Location: Left Arm, Patient Position: Sitting, Cuff Size: Normal)   Pulse 68   Temp 98.5 F (36.9 C) (Oral)   Resp 18   Ht 5\' 4"  (1.626 m)   Wt 141 lb 3.2 oz (64 kg)   SpO2 94%   BMI 24.24 kg/m  BP Readings from Last 3 Encounters:  12/22/22 (!) 140/80  06/21/22 110/80  04/29/22 132/88   Wt Readings from Last 3 Encounters:  12/22/22 141 lb 3.2 oz (64 kg)  12/01/22  143 lb (64.9 kg)  06/21/22 143 lb 9.6 oz (65.1 kg)   SpO2 Readings from Last 3 Encounters:  12/22/22 94%  06/21/22 97%  04/29/22 96%      Physical Exam Vitals and nursing note reviewed.  Constitutional:      General: She is not in acute distress.    Appearance: Normal appearance. She is well-developed.  HENT:     Head: Normocephalic and atraumatic.  Eyes:     General: No scleral icterus.       Right eye: No discharge.        Left eye: No discharge.  Cardiovascular:     Rate and Rhythm: Normal rate and regular rhythm.     Heart sounds: No murmur heard. Pulmonary:     Effort: Pulmonary effort is normal. No respiratory distress.     Breath sounds: Normal breath sounds.  Musculoskeletal:        General: Normal range of motion.     Cervical back: Normal range of motion and neck supple.     Right lower leg: No edema.     Left lower leg: No edema.  Skin:    General: Skin is warm and dry.  Neurological:     General: No focal deficit present.     Mental Status: She is alert and oriented to person, place, and time.  Psychiatric:        Mood and Affect: Mood normal.        Behavior: Behavior normal.        Thought Content: Thought content normal.        Judgment: Judgment normal.      No results found for any visits on 12/22/22.  Last CBC Lab Results  Component Value Date   WBC 9.1 06/21/2022   HGB 13.9 06/21/2022   HCT 41.6 06/21/2022   MCV 90.7 06/21/2022   MCH 32.6 03/08/2021   RDW 14.6 06/21/2022   PLT 255.0 06/21/2022   Last metabolic panel Lab Results  Component Value Date  GLUCOSE 89 06/21/2022   NA 139 06/21/2022   K 4.4 06/21/2022   CL 99 06/21/2022   CO2 32 06/21/2022   BUN 14 06/21/2022   CREATININE 0.72 06/21/2022   GFR 75.67 06/21/2022   CALCIUM 9.6 06/21/2022   PROT 6.8 06/21/2022   ALBUMIN 4.2 06/21/2022   BILITOT 0.6 06/21/2022   ALKPHOS 75 06/21/2022   AST 13 06/21/2022   ALT 11 06/21/2022   ANIONGAP 9 03/08/2021   Last lipids Lab  Results  Component Value Date   CHOL 191 06/21/2022   HDL 63.70 06/21/2022   LDLCALC 106 (H) 06/21/2022   LDLDIRECT 103.3 03/07/2014   TRIG 104.0 06/21/2022   CHOLHDL 3 06/21/2022   Last hemoglobin A1c Lab Results  Component Value Date   HGBA1C 6.1 07/20/2012   Last thyroid functions Lab Results  Component Value Date   TSH 1.61 06/21/2022   Last vitamin D Lab Results  Component Value Date   VD25OH 50 04/22/2010   Last vitamin B12 and Folate Lab Results  Component Value Date   VITAMINB12 277 08/25/2015   FOLATE 8.8 06/11/2009      The ASCVD Risk score (Arnett DK, et al., 2019) failed to calculate for the following reasons:   The 2019 ASCVD risk score is only valid for ages 98 to 13    Assessment & Plan:   Problem List Items Addressed This Visit       Unprioritized   Alzheimer's dementia without behavioral disturbance (HCC)   Urinary tract infection with hematuria    Chek ua today      Hypothyroidism    Check labs  Con't synthroid       Relevant Orders   TSH   Hyperlipidemia LDL goal <100    Encourage heart healthy diet such as MIND or DASH diet, increase exercise, avoid trans fats, simple carbohydrates and processed foods, consider a krill or fish or flaxseed oil cap daily.        Relevant Orders   Comprehensive metabolic panel   Lipid panel   Hearing loss of both ears - Primary    Refer to ent        Relevant Orders   Ambulatory referral to ENT   Generalized anxiety disorder    stable      Essential hypertension    Well controlled, no changes to meds. Encouraged heart healthy diet such as the DASH diet and exercise as tolerated.        Relevant Orders   CBC with Differential/Platelet   Comprehensive metabolic panel   Other Visit Diagnoses     Urinary tract infection without hematuria, site unspecified       Relevant Orders   POCT Urinalysis Dipstick (Automated)   High risk medication use       Relevant Orders   Drug Tox Monitor 1  w/Conf, Oral Fld     Assessment and Plan    Intermittent Diarrhea Occurring a couple of times a week, potentially related to diet. No clear etiology identified. Thyroid function was normal on last check. -Plan to monitor symptoms. No immediate intervention required.  Hearing Loss Unsuccessful attempt to get a hearing aid. Difficulty in communication noted during the visit. -Investigate the issue with the hearing aid appointment and assist in rescheduling if necessary.  Frequent Urination Increased frequency reported, potentially age-related. -Collect urine sample for analysis to rule out infection or other causes.  Anxiety Occasional anxiety, particularly during storms, managed with as-needed anxiety medication. -Continue current management  strategy, using medication as needed.  General Health Maintenance -Continue current medication regimen as all prescriptions are up to date. -Plan to receive flu shot at the pharmacy at a later date. -Order routine blood work. -Follow-up on results of urine analysis and blood work.        Return in about 6 months (around 06/24/2023), or if symptoms worsen or fail to improve, for annual exam, fasting.    Donato Schultz, DO

## 2022-12-22 NOTE — Patient Instructions (Signed)
Hearing Loss Hearing loss is a partial or total loss of the ability to hear. This can be temporary or permanent, and it can happen in one or both ears. There are two types of hearing loss. You can have just one type or both types. You may have a problem with: Damage to your hearing nerves (sensorineural hearing loss). This type of hearing loss is more likely to be permanent. A hearing aid is often the best treatment. Sound getting to your inner ear (conductive hearing loss). This type of hearing loss can usually be treated medically or surgically. Medical care is necessary to treat hearing loss properly and to prevent the condition from getting worse. Your hearing may partially or completely come back, depending on what caused your hearing loss and how severe it is. In some cases, hearing loss is permanent. What are the causes? Common causes of hearing loss include: Too much wax in the ear canal. Infection of the ear canal or middle ear. Fluid in the middle ear. Injury to the ear or surrounding area. An object stuck in the ear. A history of prolonged exposure to loud sounds, such as music. Less common causes of hearing loss include: Tumors in the ear. Viral or bacterial infections, such as meningitis. A hole in the eardrum (perforated eardrum). Problems with the hearing nerve that sends signals between the brain and the ear. Certain medicines. What are the signs or symptoms? Symptoms of this condition may include: Difficulty telling the difference between sounds. Difficulty following a conversation when there is background noise. Lack of response to sounds in your environment. This may be most noticeable when you do not respond to startling sounds. Needing to turn up the volume on the television, radio, or other devices. Ringing in the ears. Dizziness. How is this diagnosed? This condition is diagnosed based on: A physical exam. A hearing test (audiometry). The test will be performed  by a hearing specialist (audiologist). Imaging tests, such as an MRI or CT scan. You may also be referred to an ear, nose, and throat (ENT) specialist (otolaryngologist). How is this treated? Treatment for hearing loss may include: Earwax removal. Medicines to treat or prevent infection (antibiotics). Medicines to reduce inflammation (corticosteroids). Hearing aids or assistive listening devices. Follow these instructions at home: If you were prescribed an antibiotic medicine, take it as told by your health care provider. Do not stop taking the antibiotic even if you start to feel better. Take over-the-counter and prescription medicines only as told by your health care provider. Avoid loud noises. Use hearing protection if you are exposed to loud noises or work in a noisy environment. Return to your normal activities as told by your health care provider. Ask your health care provider what activities are safe for you. Keep all follow-up visits. This is important. Contact a health care provider if: You feel dizzy. You develop new symptoms. You vomit or feel nauseous. You have a fever. Get help right away if: You develop sudden changes in your vision. You have severe ear pain. You have new or increased weakness. You have a severe headache. Summary Hearing loss is a decreased ability to hear sounds around you. It can be temporary or permanent. Treatment will depend on the cause of your hearing loss. It may include earwax removal, medicines, or a hearing aid. Your hearing may partially or completely come back, depending on what caused your hearing loss and how severe it is. Keep all follow-up visits. This is important. This information is  not intended to replace advice given to you by your health care provider. Make sure you discuss any questions you have with your health care provider. Document Revised: 03/04/2021 Document Reviewed: 03/04/2021 Elsevier Patient Education  2024 [employer].

## 2022-12-24 LAB — URINE CULTURE
MICRO NUMBER:: 15335806
SPECIMEN QUALITY:: ADEQUATE

## 2022-12-25 LAB — DRUG MONITORING PANEL 376104, URINE
Amphetamines: NEGATIVE ng/mL (ref <500)
Barbiturates: NEGATIVE ng/mL (ref <300)
Benzodiazepines: NEGATIVE ng/mL (ref <100)
Cocaine Metabolite: NEGATIVE ng/mL (ref <150)
Desmethyltramadol: NEGATIVE ng/mL (ref <100)
Opiates: NEGATIVE ng/mL (ref <100)
Oxycodone: NEGATIVE ng/mL (ref <100)
Tramadol: NEGATIVE ng/mL (ref <100)

## 2022-12-25 LAB — DM TEMPLATE

## 2022-12-30 ENCOUNTER — Other Ambulatory Visit: Payer: Self-pay | Admitting: Family Medicine

## 2022-12-30 DIAGNOSIS — H9190 Unspecified hearing loss, unspecified ear: Secondary | ICD-10-CM

## 2023-01-03 ENCOUNTER — Other Ambulatory Visit: Payer: Self-pay | Admitting: Family Medicine

## 2023-01-03 DIAGNOSIS — N390 Urinary tract infection, site not specified: Secondary | ICD-10-CM

## 2023-01-03 DIAGNOSIS — F411 Generalized anxiety disorder: Secondary | ICD-10-CM

## 2023-01-04 ENCOUNTER — Other Ambulatory Visit: Payer: Self-pay

## 2023-01-04 DIAGNOSIS — F411 Generalized anxiety disorder: Secondary | ICD-10-CM

## 2023-01-06 ENCOUNTER — Other Ambulatory Visit: Payer: Self-pay | Admitting: Family Medicine

## 2023-01-06 DIAGNOSIS — N390 Urinary tract infection, site not specified: Secondary | ICD-10-CM

## 2023-01-09 ENCOUNTER — Other Ambulatory Visit: Payer: Self-pay | Admitting: Family Medicine

## 2023-01-09 DIAGNOSIS — F411 Generalized anxiety disorder: Secondary | ICD-10-CM

## 2023-01-16 ENCOUNTER — Other Ambulatory Visit: Payer: Self-pay | Admitting: Family Medicine

## 2023-01-17 ENCOUNTER — Telehealth: Payer: Self-pay | Admitting: Family Medicine

## 2023-01-17 NOTE — Telephone Encounter (Signed)
Patient called to advise that [facility] went out of business and transferred her info to [facility]. Patient said she got a whole lot of medicine delivered, some that she didn't even need so she won't need refills for a while. Pt is not going to use [facility]. She is planning to switch to [facility] [address]. Please update pharmacy

## 2023-01-17 NOTE — Telephone Encounter (Signed)
updated

## 2023-01-30 DIAGNOSIS — L408 Other psoriasis: Secondary | ICD-10-CM | POA: Diagnosis not present

## 2023-01-31 ENCOUNTER — Ambulatory Visit: Payer: Medicare HMO | Admitting: Audiologist

## 2023-01-31 ENCOUNTER — Ambulatory Visit: Payer: Medicare HMO | Attending: Family Medicine | Admitting: Audiologist

## 2023-01-31 DIAGNOSIS — H903 Sensorineural hearing loss, bilateral: Secondary | ICD-10-CM | POA: Insufficient documentation

## 2023-01-31 NOTE — Procedures (Signed)
Outpatient Audiology and [facility] [address] [place], [place]  [address] [phone]  AUDIOLOGICAL  EVALUATION  NAME: Carrie Hall     DOB:   Mar 30, 1934      MRN: [id number]                                                                                     DATE: 01/31/2023     REFERENT: Donato Schultz, DO STATUS: Outpatient DIAGNOSIS: Sensorineural hearing loss bilaterally  History: [name] was seen for an audiological evaluation due to difficulty hearing at all times.  She was accompanied today by her son.  [name] was last seen by [facility] audiology in 2017.  At that time she had a mild to moderate sensorineural hearing loss and hearing aids were recommended.  She denies ever having tried hearing aids.  She is now diagnosed with Alzheimer's. Her son says that the symptoms are mild.  She denies any pain pressure or tinnitus in either ear.  She recently had wax removed from the right ear however she was told it was stuck to her ear.  Evaluation:  Otoscopy showed a no view of the tympanic membrane in the right ear due to cerumen, slight view on left ear, bilaterally Tympanometry results were consistent with normal middle ear function Audiometric testing was completed using Conventional Audiometry techniques with insert earphones and TDH headphones. Test results are consistent with sloping sensorineural hearing loss bilaterally. Speech Recognition Thresholds were obtained at  40dB HL in the right ear and at  50 dB HL in the left ear. Word Recognition Testing was completed at  80dB HL in the right ear and 90dB in the left ear. [name] scored 84% in the right ear, 76% in the left ear.    Results:  The test results were reviewed with [name] and her son.  [name] has mild sloping to severe sensorineural  hearing loss bilaterally.  [name] still needs hearing aids in both ears,  due to cognitive changes hearing aids are strongly recommended sooner rather than later.  [name] is willing  to try hearing aids  Recommendations: Amplification strongly recommended for both years.  Handout on local providers was given to son. Handout on [employer] given to the patency.  She was advised to use this in her right ear to help soften the wax.  32  minutes spent testing and counseling on results.   If you have any questions please feel free to contact me at [phone].  Ammie Ferrier Audiologist, Au.D., CCC-A 01/31/2023  10:46 AM  Cc: Donato Schultz, DO

## 2023-02-02 DIAGNOSIS — H52223 Regular astigmatism, bilateral: Secondary | ICD-10-CM | POA: Diagnosis not present

## 2023-02-02 DIAGNOSIS — H524 Presbyopia: Secondary | ICD-10-CM | POA: Diagnosis not present

## 2023-02-02 DIAGNOSIS — H35033 Hypertensive retinopathy, bilateral: Secondary | ICD-10-CM | POA: Diagnosis not present

## 2023-02-02 DIAGNOSIS — H35373 Puckering of macula, bilateral: Secondary | ICD-10-CM | POA: Diagnosis not present

## 2023-02-02 DIAGNOSIS — H353133 Nonexudative age-related macular degeneration, bilateral, advanced atrophic without subfoveal involvement: Secondary | ICD-10-CM | POA: Diagnosis not present

## 2023-02-07 ENCOUNTER — Telehealth: Payer: Self-pay | Admitting: Family Medicine

## 2023-02-07 NOTE — Telephone Encounter (Signed)
Pt needs appt or a lab visit please

## 2023-02-07 NOTE — Telephone Encounter (Signed)
Pt said she is having uti symptoms-difficulty urinating and "it's just the way it feels down there." Pt said she had diarrhea a couple of days ago and she usually gets a UTI after having diarrhea. Please send medication to [facility] on [address]. Please call pt to advise if she is able to get medication

## 2023-02-09 ENCOUNTER — Ambulatory Visit: Payer: Medicare HMO | Admitting: Family Medicine

## 2023-02-09 ENCOUNTER — Encounter: Payer: Self-pay | Admitting: Family Medicine

## 2023-02-09 VITALS — BP 118/68 | HR 58 | Temp 98.6°F | Resp 18 | Wt 138.0 lb

## 2023-02-09 DIAGNOSIS — R399 Unspecified symptoms and signs involving the genitourinary system: Secondary | ICD-10-CM | POA: Diagnosis not present

## 2023-02-09 LAB — POC URINALSYSI DIPSTICK (AUTOMATED)
Bilirubin, UA: NEGATIVE
Glucose, UA: NEGATIVE
Ketones, UA: NEGATIVE
Nitrite, UA: POSITIVE
Protein, UA: POSITIVE — AB
Spec Grav, UA: 1.01 (ref 1.010–1.025)
Urobilinogen, UA: 0.2 U/dL
pH, UA: 6 (ref 5.0–8.0)

## 2023-02-09 MED ORDER — CEPHALEXIN 500 MG PO CAPS
500.0000 mg | ORAL_CAPSULE | Freq: Two times a day (BID) | ORAL | 0 refills | Status: AC
Start: 2023-02-09 — End: 2023-02-16

## 2023-02-09 NOTE — Progress Notes (Signed)
Acute Office Visit  Subjective:     Patient ID: Carrie Hall, female    DOB: 03-Nov-1933, 89 y.o.   MRN: [id number]  Chief Complaint  Patient presents with   Burning with urination    HPI Patient is in today for UTI symptoms.   Discussed the use of AI scribe software for clinical note transcription with the patient, who gave verbal consent to proceed.  History of Present Illness   The patient presents with urinary symptoms, which began approximately five days ago. They describe a sensation of pressure and mild burning during urination, with difficulty in fully emptying the bladder. The patient denies any changes in the appearance or odor of the urine. They have a history of recurrent urinary tract infections, which they believe are triggered by episodes of diarrhea.  The patient also reports a recent episode of diarrhea, which they attribute to consuming ginger cookies. They believe they may have an allergy to ginger. The diarrhea has since resolved with the use of Imodium.  The patient maintains hydration by consuming bottled water regularly. They have previously been treated successfully with Keflex for similar urinary symptoms.             ROS All review of systems negative except what is listed in the HPI      Objective:    BP 118/68 (BP Location: Right Arm, Patient Position: Sitting, Cuff Size: Small)   Pulse (!) 58   Temp 98.6 F (37 C) (Oral)   Resp 18   Wt 138 lb (62.6 kg)   SpO2 98%   BMI 23.69 kg/m    Physical Exam Vitals reviewed.  Constitutional:      Appearance: Normal appearance.  Cardiovascular:     Rate and Rhythm: Normal rate and regular rhythm.     Heart sounds: Normal heart sounds.  Pulmonary:     Effort: Pulmonary effort is normal.     Breath sounds: Normal breath sounds.  Abdominal:     General: Abdomen is flat. Bowel sounds are normal. There is no distension.     Palpations: Abdomen is soft.     Tenderness: There is no abdominal  tenderness. There is no right CVA tenderness, left CVA tenderness or guarding.  Skin:    General: Skin is warm and dry.  Neurological:     Mental Status: She is alert and oriented to person, place, and time.  Psychiatric:        Mood and Affect: Mood normal.        Behavior: Behavior normal.        Thought Content: Thought content normal.        Judgment: Judgment normal.       Results for orders placed or performed in visit on 02/09/23  POCT Urinalysis Dipstick (Automated)  Result Value Ref Range   Color, UA YELLOW    Clarity, UA CLOUDY    Glucose, UA Negative Negative   Bilirubin, UA NEG    Ketones, UA NEG    Spec Grav, UA 1.010 1.010 - 1.025   Blood, UA TRACE    pH, UA 6.0 5.0 - 8.0   Protein, UA Positive (A) Negative   Urobilinogen, UA 0.2 0.2 or 1.0 E.U./dL   Nitrite, UA POSITIVE    Leukocytes, UA Large (3+) (A) Negative        Assessment & Plan:   Problem List Items Addressed This Visit   None Visit Diagnoses     UTI symptoms    -  Primary   Relevant Medications   cephALEXin (KEFLEX) 500 MG capsule   Other Relevant Orders   POCT Urinalysis Dipstick (Automated) (Completed)   Urine Culture          Urinary Tract Infection Dysuria and urinary frequency since Sunday. Urinalysis suggestive of UTI. -Start Keflex empirically -Encourage increased water intake. -Will call with culture results and adjust antibiotic if necessary. -Repeat UA/micro in 2-3 weeks after UTI resolves to ensure protein/hematuria returns to baseline          Meds ordered this encounter  Medications   cephALEXin (KEFLEX) 500 MG capsule    Sig: Take 1 capsule (500 mg total) by mouth 2 (two) times daily for 7 days.    Dispense:  14 capsule    Refill:  0    Order Specific Question:   Supervising Provider    Answer:   Danise Edge A [[id number]]    Return if symptoms worsen or fail to improve.  Clayborne Dana, NP

## 2023-02-10 DIAGNOSIS — Z79899 Other long term (current) drug therapy: Secondary | ICD-10-CM | POA: Diagnosis not present

## 2023-02-21 ENCOUNTER — Other Ambulatory Visit: Payer: Self-pay | Admitting: Family Medicine

## 2023-02-21 DIAGNOSIS — M8580 Other specified disorders of bone density and structure, unspecified site: Secondary | ICD-10-CM

## 2023-02-21 DIAGNOSIS — E039 Hypothyroidism, unspecified: Secondary | ICD-10-CM

## 2023-02-21 DIAGNOSIS — F411 Generalized anxiety disorder: Secondary | ICD-10-CM

## 2023-02-21 DIAGNOSIS — J410 Simple chronic bronchitis: Secondary | ICD-10-CM

## 2023-02-21 NOTE — Telephone Encounter (Signed)
Prescription Request  02/21/2023  Is this a "Controlled Substance" medicine? Yes  LOV: 12/22/2022  What is the name of the medication or equipment?   atorvastatin (LIPITOR) 20 MG tablet [[id number]]  ALPRAZolam (XANAX) 0.25 MG tablet [[id number]]  levothyroxine (SYNTHROID) 88 MCG tablet [[id number]]  ADVAIR HFA 115-21 MCG/ACT inhaler [[id number]]  alendronate (FOSAMAX) 70 MG tablet [[id number]]  Have you contacted your pharmacy to request a refill? No   Which pharmacy would you like this sent to?   [facility] [address] [place], [place] - [address] [place] [address] Phone: [phone] Fax: [phone]  Patient notified that their request is being sent to the clinical staff for review and that they should receive a response within 2 business days.   Please advise at Mobile [phone] (mobile)

## 2023-02-22 ENCOUNTER — Institutional Professional Consult (permissible substitution) (INDEPENDENT_AMBULATORY_CARE_PROVIDER_SITE_OTHER): Payer: Medicare HMO | Admitting: Otolaryngology

## 2023-02-22 MED ORDER — ALPRAZOLAM 0.25 MG PO TABS: 0.2500 mg | ORAL_TABLET | Freq: Every evening | ORAL | 2 refills | Status: DC | PRN

## 2023-02-22 MED ORDER — LEVOTHYROXINE SODIUM 88 MCG PO TABS: 88.0000 ug | ORAL_TABLET | Freq: Every day | ORAL | 1 refills | Status: DC

## 2023-02-22 MED ORDER — ATORVASTATIN CALCIUM 20 MG PO TABS: 20.0000 mg | ORAL_TABLET | Freq: Every day | ORAL | 10 refills | Status: DC

## 2023-02-22 MED ORDER — FLUTICASONE-SALMETEROL 115-21 MCG/ACT IN AERO: 2.0000 | INHALATION_SPRAY | Freq: Two times a day (BID) | RESPIRATORY_TRACT | 5 refills | Status: DC

## 2023-02-22 MED ORDER — ALENDRONATE SODIUM 70 MG PO TABS: 70.0000 mg | ORAL_TABLET | ORAL | 3 refills | Status: DC

## 2023-02-22 NOTE — Addendum Note (Signed)
Addended by: Roxanne Gates on: 02/22/2023 02:28 PM   Modules accepted: Orders

## 2023-02-22 NOTE — Telephone Encounter (Signed)
Requesting: Xanax Contract: 12/22/2022 UDS: 12/22/2022 Last OV: 02/09/23 Next OV: 06/26/2023 Last Refill: 01/04/23, #30--0 RF Database:   Please advise

## 2023-03-02 ENCOUNTER — Other Ambulatory Visit: Payer: Self-pay | Admitting: Family Medicine

## 2023-03-02 ENCOUNTER — Telehealth: Payer: Self-pay | Admitting: Family Medicine

## 2023-03-02 DIAGNOSIS — J441 Chronic obstructive pulmonary disease with (acute) exacerbation: Secondary | ICD-10-CM

## 2023-03-02 MED ORDER — BUDESONIDE-FORMOTEROL FUMARATE 80-4.5 MCG/ACT IN AERO
2.0000 | INHALATION_SPRAY | Freq: Two times a day (BID) | RESPIRATORY_TRACT | 3 refills | Status: DC
Start: 2023-03-02 — End: 2023-03-08

## 2023-03-02 NOTE — Telephone Encounter (Signed)
[facility] called stating that the following medication would not be covered by pt insurance:  fluticasone-salmeterol (ADVAIR HFA) 161-09 MCG/ACT inhaler [[id number]]  Rep stated the following medications should be covered by pt insurance:  -Advair Diskus -Symbicort -Brio Ellipta

## 2023-03-02 NOTE — Telephone Encounter (Signed)
Okay to change to alternative?

## 2023-03-03 NOTE — Telephone Encounter (Signed)
Pt made aware

## 2023-03-08 ENCOUNTER — Telehealth: Payer: Self-pay | Admitting: Family Medicine

## 2023-03-08 DIAGNOSIS — J441 Chronic obstructive pulmonary disease with (acute) exacerbation: Secondary | ICD-10-CM

## 2023-03-08 MED ORDER — BUDESONIDE-FORMOTEROL FUMARATE 80-4.5 MCG/ACT IN AERO: 2.0000 | INHALATION_SPRAY | Freq: Two times a day (BID) | RESPIRATORY_TRACT | 3 refills | Status: DC

## 2023-03-08 NOTE — Telephone Encounter (Signed)
Pt called stating that [facility] had [facility] regarding [facility]s available for pick up. Pt was under the impression that all her meds were supposed to be sent to the following pharmacy:  [facility] [address], [place], [place] [address] P: [phone]  After reviewing chart, noted that [facility]. Advised a note would be sent back to get this corrected. Pt would like a call when completed. Routed HP due to internal error.

## 2023-03-08 NOTE — Telephone Encounter (Signed)
Reviewed chart. Appears only the inhaler was sent to the wrong pharmacy. Rx sent to the correct pharmacy

## 2023-03-08 NOTE — Addendum Note (Signed)
Addended by: Roxanne Gates on: 03/08/2023 10:18 AM   Modules accepted: Orders

## 2023-04-24 ENCOUNTER — Encounter: Payer: Self-pay | Admitting: Family Medicine

## 2023-04-24 ENCOUNTER — Telehealth: Payer: Self-pay | Admitting: Family Medicine

## 2023-04-24 DIAGNOSIS — E039 Hypothyroidism, unspecified: Secondary | ICD-10-CM

## 2023-04-24 NOTE — Telephone Encounter (Signed)
**  Pt called stating that she is having issues with getting her levothyroxine as well. Advised Rx was sent in on 10.16.24 for a 90-day supply with 1 refill. Advised a note would be sent back to investigate.**  Prescription Request  04/24/2023  Is this a "Controlled Substance" medicine? No  LOV: 12/22/2022  What is the name of the medication or equipment?   methotrexate (RHEUMATREX) 2.5 MG tablet [[id number]]  folic acid (FOLVITE) 1 MG tablet [[id number]]  Have you contacted your pharmacy to request a refill? No   Which pharmacy would you like this sent to?   [facility] [address] [place], [place] - [address] [place] [address] Phone: [phone] Fax: [phone]  Patient notified that their request is being sent to the clinical staff for review and that they should receive a response within 2 business days.   Please advise at Mobile [phone] (mobile)

## 2023-04-24 NOTE — Telephone Encounter (Signed)
error 

## 2023-04-25 MED ORDER — LEVOTHYROXINE SODIUM 88 MCG PO TABS: 88.0000 ug | ORAL_TABLET | Freq: Every day | ORAL | 1 refills | Status: DC

## 2023-04-25 NOTE — Telephone Encounter (Signed)
methotrexate (RHEUMATREX) 2.5 MG tablet [[id number]]   folic acid (FOLVITE) 1 MG tablet [[id number]]   Pt requesting refills These medications are on patient's list under historical provider. Please advise. Thyroid medication refilled

## 2023-04-25 NOTE — Addendum Note (Signed)
Addended by: Roxanne Gates on: 04/25/2023 01:20 PM   Modules accepted: Orders

## 2023-05-10 DIAGNOSIS — H3530 Unspecified macular degeneration: Secondary | ICD-10-CM

## 2023-05-10 HISTORY — DX: Unspecified macular degeneration: H35.30

## 2023-05-23 DIAGNOSIS — H353221 Exudative age-related macular degeneration, left eye, with active choroidal neovascularization: Secondary | ICD-10-CM | POA: Diagnosis not present

## 2023-06-06 DIAGNOSIS — H43813 Vitreous degeneration, bilateral: Secondary | ICD-10-CM | POA: Diagnosis not present

## 2023-06-06 DIAGNOSIS — H353133 Nonexudative age-related macular degeneration, bilateral, advanced atrophic without subfoveal involvement: Secondary | ICD-10-CM | POA: Diagnosis not present

## 2023-06-06 DIAGNOSIS — H353221 Exudative age-related macular degeneration, left eye, with active choroidal neovascularization: Secondary | ICD-10-CM | POA: Diagnosis not present

## 2023-06-20 DIAGNOSIS — H353221 Exudative age-related macular degeneration, left eye, with active choroidal neovascularization: Secondary | ICD-10-CM | POA: Diagnosis not present

## 2023-06-26 ENCOUNTER — Other Ambulatory Visit (HOSPITAL_BASED_OUTPATIENT_CLINIC_OR_DEPARTMENT_OTHER): Payer: Self-pay

## 2023-06-26 ENCOUNTER — Encounter: Payer: Self-pay | Admitting: Family Medicine

## 2023-06-26 ENCOUNTER — Ambulatory Visit (INDEPENDENT_AMBULATORY_CARE_PROVIDER_SITE_OTHER): Payer: Medicare HMO | Admitting: Family Medicine

## 2023-06-26 VITALS — BP 120/70 | HR 84 | Temp 97.8°F | Resp 18 | Ht 64.0 in | Wt 143.2 lb

## 2023-06-26 DIAGNOSIS — I10 Essential (primary) hypertension: Secondary | ICD-10-CM

## 2023-06-26 DIAGNOSIS — I700 Atherosclerosis of aorta: Secondary | ICD-10-CM

## 2023-06-26 DIAGNOSIS — G309 Alzheimer's disease, unspecified: Secondary | ICD-10-CM

## 2023-06-26 DIAGNOSIS — E039 Hypothyroidism, unspecified: Secondary | ICD-10-CM

## 2023-06-26 DIAGNOSIS — E785 Hyperlipidemia, unspecified: Secondary | ICD-10-CM | POA: Diagnosis not present

## 2023-06-26 DIAGNOSIS — Z0000 Encounter for general adult medical examination without abnormal findings: Secondary | ICD-10-CM

## 2023-06-26 DIAGNOSIS — J4489 Other specified chronic obstructive pulmonary disease: Secondary | ICD-10-CM

## 2023-06-26 DIAGNOSIS — N390 Urinary tract infection, site not specified: Secondary | ICD-10-CM | POA: Diagnosis not present

## 2023-06-26 DIAGNOSIS — F0280 Dementia in other diseases classified elsewhere without behavioral disturbance: Secondary | ICD-10-CM

## 2023-06-26 LAB — CBC WITH DIFFERENTIAL/PLATELET
Basophils Absolute: 0.1 10*3/uL (ref 0.0–0.1)
Basophils Relative: 0.9 % (ref 0.0–3.0)
Eosinophils Absolute: 0.1 10*3/uL (ref 0.0–0.7)
Eosinophils Relative: 1.4 % (ref 0.0–5.0)
HCT: 41.8 % (ref 36.0–46.0)
Hemoglobin: 13.9 g/dL (ref 12.0–15.0)
Lymphocytes Relative: 22.4 % (ref 12.0–46.0)
Lymphs Abs: 1.8 10*3/uL (ref 0.7–4.0)
MCHC: 33.2 g/dL (ref 30.0–36.0)
MCV: 92 fL (ref 78.0–100.0)
Monocytes Absolute: 0.7 10*3/uL (ref 0.1–1.0)
Monocytes Relative: 9.4 % (ref 3.0–12.0)
Neutro Abs: 5.2 10*3/uL (ref 1.4–7.7)
Neutrophils Relative %: 65.9 % (ref 43.0–77.0)
Platelets: 234 10*3/uL (ref 150.0–400.0)
RBC: 4.54 Mil/uL (ref 3.87–5.11)
RDW: 15.4 % (ref 11.5–15.5)
WBC: 8 10*3/uL (ref 4.0–10.5)

## 2023-06-26 LAB — LIPID PANEL
Cholesterol: 171 mg/dL (ref 0–200)
HDL: 63 mg/dL (ref >39.00)
LDL Cholesterol: 92 mg/dL (ref 0–99)
NonHDL: 107.94
Total CHOL/HDL Ratio: 3
Triglycerides: 80 mg/dL (ref 0.0–149.0)
VLDL: 16 mg/dL (ref 0.0–40.0)

## 2023-06-26 LAB — COMPREHENSIVE METABOLIC PANEL
ALT: 9 U/L (ref 0–35)
AST: 13 U/L (ref 0–37)
Albumin: 4.2 g/dL (ref 3.5–5.2)
Alkaline Phosphatase: 66 U/L (ref 39–117)
BUN: 14 mg/dL (ref 6–23)
CO2: 32 meq/L (ref 19–32)
Calcium: 9.3 mg/dL (ref 8.4–10.5)
Chloride: 102 meq/L (ref 96–112)
Creatinine, Ser: 0.71 mg/dL (ref 0.40–1.20)
GFR: 76.41 mL/min (ref >60.00)
Glucose, Bld: 104 mg/dL — ABNORMAL HIGH (ref 70–99)
Potassium: 4.6 meq/L (ref 3.5–5.1)
Sodium: 142 meq/L (ref 135–145)
Total Bilirubin: 0.6 mg/dL (ref 0.2–1.2)
Total Protein: 6.6 g/dL (ref 6.0–8.3)

## 2023-06-26 LAB — TSH: TSH: 0.79 u[IU]/mL (ref 0.35–5.50)

## 2023-06-26 MED ORDER — ALBUTEROL SULFATE HFA 108 (90 BASE) MCG/ACT IN AERS: 1.0000 | INHALATION_SPRAY | Freq: Four times a day (QID) | RESPIRATORY_TRACT | 0 refills | Status: DC | PRN

## 2023-06-26 MED ORDER — AREXVY 120 MCG/0.5ML IM SUSR
0.5000 mL | Freq: Once | INTRAMUSCULAR | 0 refills | Status: AC
  Filled 2023-06-26: qty 0.5, 1d supply, fill #0

## 2023-06-26 MED ORDER — ATORVASTATIN CALCIUM 20 MG PO TABS: 20.0000 mg | ORAL_TABLET | Freq: Every day | ORAL | 10 refills | Status: AC

## 2023-06-26 NOTE — Progress Notes (Signed)
Established Patient Office Visit  Subjective   Patient ID: Carrie Hall, female    DOB: 01-16-1934  Age: 89 y.o. MRN: [id number]  Chief Complaint  Patient presents with   Annual Exam    Pt states fasting     HPI Discussed the use of AI scribe software for clinical note transcription with the patient, who gave verbal consent to proceed.  History of Present Illness   Carrie Hall is an 89 year old female who presents for an annual physical exam. She is accompanied by her oldest son.  She experiences ongoing hearing difficulties despite recent adjustments to her hearing aids. She is unsure of the technician's name who assists with her hearing aids, but notes that the service is usually responsive. Her husband also uses the same service.  She previously experienced severe diarrhea and loss of appetite while on memory medication, suspected to be Aricept, which led to significant weight loss. After discontinuing the medication, her diarrhea resolved, but her appetite has not fully returned. She can eat but only in small amounts. Her memory is reportedly good, and she manages her appointments on a calendar. No memory problems reported.  She uses albuterol and atorvastatin, which were recently refilled. Her breathing is generally good, though she experiences some difficulty when walking extensively. She tries to walk around her house for exercise when the weather permits. She experiences cold hands, which she attributes to blood thinners.  She uses a cane for ambulation but forgot it today. She expresses concern about falling, especially after her sister fell and broke her hip. She lives close to her sons, who assist her when needed. She is able to perform housework, laundry, and cooking without joint pain.      Patient Active Problem List   Diagnosis Date Noted   Osteopenia 06/21/2022   Alzheimer's dementia without behavioral disturbance (HCC) 06/21/2022   Fall 03/19/2021   Frequent  urination 11/27/2020   COPD exacerbation (HCC) 11/27/2020   Aortic atherosclerosis (HCC) 08/12/2020   Oliguria 08/11/2020   Acute right-sided low back pain without sciatica 08/11/2020   Psoriasis 11/18/2019   Essential hypertension 02/12/2018   Generalized anxiety disorder 01/01/2018   Mild cognitive impairment 11/10/2017   Elevated LFTs 10/04/2017   Loss of weight 10/04/2017   Loose stools 10/04/2017   Weight loss, non-intentional 08/07/2017   Preventative health care 08/07/2017   Cerumen impaction 08/25/2015   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   Memory loss 08/25/2015   KNEE PAIN, LEFT 05/27/2010   DYSURIA 05/27/2010   Asymptomatic postmenopausal status 04/23/2010   CERVICAL POLYP 10/26/2009   HEMATURIA, HX OF 10/26/2009   DIZZINESS 06/11/2009   FOOT, PAIN 11/17/2008   OTHER ABNORMAL BLOOD CHEMISTRY 11/17/2008   TINEA CRURIS 09/15/2008   Urinary tract infection with hematuria 09/15/2008   History of colonic polyps 01/16/2008   HEMOCCULT POSITIVE STOOL 12/11/2007   BREAST CANCER, HX OF 12/11/2007   Hypothyroidism 12/07/2007   Hyperlipidemia LDL goal <100 12/07/2007   COPD with chronic bronchitis (HCC) 12/07/2007   Disorder of bone and articular cartilage 12/07/2007   URI 03/13/2007   HX, URINARY INFECTION 01/09/2007   Past Medical History:  Diagnosis Date   Adenomatous colon polyp    Breast cancer (HCC)    COPD (chronic obstructive pulmonary disease) (HCC)    Diverticulosis    Hyperlipidemia    Internal hemorrhoids    Osteopenia    Thyroid disease    Past Surgical History:  Procedure Laterality Date  BREAST LUMPECTOMY Left    radiation only   BREAST SURGERY     colon polyps     Social History   Tobacco Use   Smoking status: Former    Current packs/day: 0.00    Average packs/day: 0.5 packs/day for 48.6 years (24.3 ttl pk-yrs)    Types: Cigarettes    Start date: 28    Quit date: 12/06/2003    Years since quitting: 19.5   Smokeless tobacco: Never   Vaping Use   Vaping status: Never Used  Substance Use Topics   Alcohol use: No   Drug use: No   Social History   Socioeconomic History   Marital status: Widowed    Spouse name: Not on file   Number of children: 2   Years of education: Not on file   Highest education level: Not on file  Occupational History   Occupation: retired - homemaker  Tobacco Use   Smoking status: Former    Current packs/day: 0.00    Average packs/day: 0.5 packs/day for 48.6 years (24.3 ttl pk-yrs)    Types: Cigarettes    Start date: 88    Quit date: 12/06/2003    Years since quitting: 19.5   Smokeless tobacco: Never  Vaping Use   Vaping status: Never Used  Substance and Sexual Activity   Alcohol use: No   Drug use: No   Sexual activity: Never    Partners: Male  Other Topics Concern   Not on file  Social History Narrative   Pt lives alone in 1 story home   Has 2 sons that live nearby   9th grade education   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   Right handed    Social Drivers of Corporate investment banker Strain: Low Risk  (12/01/2022)   Overall Financial Resource Strain (CARDIA)    Difficulty of Paying Living Expenses: Not hard at all  Food Insecurity: No Food Insecurity (12/01/2022)   Hunger Vital Sign    Worried About Running Out of Food in the Last Year: Never true    Ran Out of Food in the Last Year: Never true  Transportation Needs: No Transportation Needs (12/01/2022)   PRAPARE - Administrator, Civil Service (Medical): No    Lack of Transportation (Non-Medical): No  Physical Activity: Insufficiently Active (12/01/2022)   Exercise Vital Sign    Days of Exercise per Week: 5 days    Minutes of Exercise per Session: 10 min  Stress: No Stress Concern Present (12/01/2022)   [employer] of Occupational Health - Occupational Stress Questionnaire    Feeling of Stress : Not at all  Social Connections: Moderately Isolated (12/01/2022)   Social Connection and  Isolation Panel [NHANES]    Frequency of Communication with Friends and Family: Twice a week    Frequency of Social Gatherings with Friends and Family: More than three times a week    Attends Religious Services: More than 4 times per year    Active Member of [employer] or Organizations: No    Attends Banker Meetings: Never    Marital Status: Widowed  Intimate Partner Violence: Not At Risk (12/01/2022)   Humiliation, Afraid, Rape, and Kick questionnaire    Fear of Current or Ex-Partner: No    Emotionally Abused: No    Physically Abused: No    Sexually Abused: No   Family Status  Relation Name Status   Mother  Deceased   Father  Deceased   MGM  Deceased   Sister  Deceased  No partnership data on file   Family History  Problem Relation Age of Onset   Alzheimer's disease Mother    Cancer Father        lung   Alzheimer's disease Maternal Grandmother    Rectal cancer Sister        in her 18s   Allergies  Allergen Reactions   Pneumococcal Vaccines Itching and Swelling   Donepezil Hcl Diarrhea      Review of Systems  Constitutional:  Negative for chills, fever and malaise/fatigue.  HENT:  Negative for congestion and hearing loss.   Eyes:  Negative for blurred vision and discharge.  Respiratory:  Negative for cough, sputum production and shortness of breath.   Cardiovascular:  Negative for chest pain, palpitations and leg swelling.  Gastrointestinal:  Negative for abdominal pain, blood in stool, constipation, diarrhea, heartburn, nausea and vomiting.  Genitourinary:  Negative for dysuria, frequency, hematuria and urgency.  Musculoskeletal:  Negative for back pain, falls and myalgias.  Skin:  Negative for rash.  Neurological:  Negative for dizziness, sensory change, loss of consciousness, weakness and headaches.  Endo/Heme/Allergies:  Negative for environmental allergies. Does not bruise/bleed easily.  Psychiatric/Behavioral:  Negative for depression and suicidal  ideas. The patient is not nervous/anxious and does not have insomnia.       Objective:     BP 120/70 (BP Location: Left Arm, Patient Position: Sitting, Cuff Size: Normal)   Pulse 84   Temp 97.8 F (36.6 C) (Oral)   Resp 18   Ht 5\' 4"  (1.626 m)   Wt 143 lb 3.2 oz (65 kg)   SpO2 98%   BMI 24.58 kg/m  BP Readings from Last 3 Encounters:  06/26/23 120/70  02/09/23 118/68  12/22/22 (!) 140/80   Wt Readings from Last 3 Encounters:  06/26/23 143 lb 3.2 oz (65 kg)  02/09/23 138 lb (62.6 kg)  12/22/22 141 lb 3.2 oz (64 kg)   SpO2 Readings from Last 3 Encounters:  06/26/23 98%  02/09/23 98%  12/22/22 94%      Physical Exam Vitals and nursing note reviewed.  Constitutional:      General: She is not in acute distress.    Appearance: Normal appearance. She is well-developed.  HENT:     Head: Normocephalic and atraumatic.     Right Ear: Tympanic membrane, ear canal and external ear normal. There is no impacted cerumen.     Left Ear: Tympanic membrane, ear canal and external ear normal. There is no impacted cerumen.     Nose: Nose normal.     Mouth/Throat:     Mouth: Mucous membranes are moist.     Pharynx: Oropharynx is clear. No oropharyngeal exudate or posterior oropharyngeal erythema.  Eyes:     General: No scleral icterus.       Right eye: No discharge.        Left eye: No discharge.     Conjunctiva/sclera: Conjunctivae normal.     Pupils: Pupils are equal, round, and reactive to light.  Neck:     Thyroid: No thyromegaly or thyroid tenderness.     Vascular: No JVD.  Cardiovascular:     Rate and Rhythm: Normal rate and regular rhythm.     Heart sounds: Normal heart sounds. No murmur heard. Pulmonary:     Effort: Pulmonary effort is normal. No respiratory distress.     Breath sounds: Normal breath sounds.  Abdominal:  General: Bowel sounds are normal. There is no distension.     Palpations: Abdomen is soft. There is no mass.     Tenderness: There is no  abdominal tenderness. There is no guarding or rebound.  Genitourinary:    Vagina: Normal.  Musculoskeletal:        General: Normal range of motion.     Cervical back: Normal range of motion and neck supple.     Right lower leg: No edema.     Left lower leg: No edema.  Lymphadenopathy:     Cervical: No cervical adenopathy.  Skin:    General: Skin is warm and dry.     Findings: No erythema or rash.  Neurological:     Mental Status: She is alert and oriented to person, place, and time.     Cranial Nerves: No cranial nerve deficit.     Deep Tendon Reflexes: Reflexes are normal and symmetric.  Psychiatric:        Mood and Affect: Mood normal.        Behavior: Behavior normal.        Thought Content: Thought content normal.        Judgment: Judgment normal.      No results found for any visits on 06/26/23.  Last CBC Lab Results  Component Value Date   WBC 7.7 12/22/2022   HGB 13.7 12/22/2022   HCT 42.9 12/22/2022   MCV 93.3 12/22/2022   MCH 32.6 03/08/2021   RDW 14.7 12/22/2022   PLT 265.0 12/22/2022   Last metabolic panel Lab Results  Component Value Date   GLUCOSE 98 12/22/2022   NA 136 12/22/2022   K 4.2 12/22/2022   CL 96 12/22/2022   CO2 34 (H) 12/22/2022   BUN 16 12/22/2022   CREATININE 0.70 12/22/2022   GFR 78.00 12/22/2022   CALCIUM 9.6 12/22/2022   PROT 6.5 12/22/2022   ALBUMIN 4.2 12/22/2022   BILITOT 0.7 12/22/2022   ALKPHOS 70 12/22/2022   AST 14 12/22/2022   ALT 11 12/22/2022   ANIONGAP 9 03/08/2021   Last lipids Lab Results  Component Value Date   CHOL 190 12/22/2022   HDL 59.00 12/22/2022   LDLCALC 106 (H) 12/22/2022   LDLDIRECT 103.3 03/07/2014   TRIG 123.0 12/22/2022   CHOLHDL 3 12/22/2022   Last hemoglobin A1c Lab Results  Component Value Date   HGBA1C 6.1 07/20/2012   Last thyroid functions Lab Results  Component Value Date   TSH 3.51 12/22/2022   Last vitamin D Lab Results  Component Value Date   VD25OH 50 04/22/2010    Last vitamin B12 and Folate Lab Results  Component Value Date   VITAMINB12 277 08/25/2015   FOLATE 8.8 06/11/2009    The ASCVD Risk score (Arnett DK, et al., 2019) failed to calculate for the following reasons:   The 2019 ASCVD risk score is only valid for ages 60 to 17    Assessment & Plan:   Problem List Items Addressed This Visit       Unprioritized   Aortic atherosclerosis (HCC)   Relevant Medications   atorvastatin (LIPITOR) 20 MG tablet   Other Relevant Orders   Comprehensive metabolic panel   Lipid panel   Alzheimer's dementia without behavioral disturbance [facility])   Preventative health care - Primary   Ghm utd Check labs  See AVS Health Maintenance  Topic Date Due   COVID-19 Vaccine (3 - Pfizer risk series) 09/23/2019   MAMMOGRAM  06/25/2024 (Originally 03/13/2018)  DEXA SCAN  06/25/2024 (Originally 10/07/2019)   Medicare Annual Wellness (AWV)  12/01/2023   DTaP/Tdap/Td (3 - Td or Tdap) 08/08/2027   Pneumonia Vaccine 51+ Years old  Completed   INFLUENZA VACCINE  Completed   Zoster Vaccines- Shingrix  Completed   HPV VACCINES  Aged Out  Pt prefers to not do mammograms/ bone densities etc        Hypothyroidism   Check labs today      Relevant Orders   TSH   Hyperlipidemia LDL goal <100   Tolerating statin, encouraged heart healthy diet, avoid trans fats, minimize simple carbs and saturated fats. Increase exercise as tolerated       Relevant Medications   atorvastatin (LIPITOR) 20 MG tablet   Other Relevant Orders   Comprehensive metabolic panel   Lipid panel   Essential hypertension   Well controlled, no changes to meds. Encouraged heart healthy diet such as the DASH diet and exercise as tolerated.        Relevant Medications   atorvastatin (LIPITOR) 20 MG tablet   Other Relevant Orders   CBC with Differential/Platelet   Comprehensive metabolic panel   Lipid panel   COPD with chronic bronchitis (HCC)   Con't inhaler F/u pulmonary Stable        Relevant Medications   albuterol (VENTOLIN HFA) 108 (90 Base) MCG/ACT inhaler   Other Visit Diagnoses       Urinary tract infection without hematuria, site unspecified         Assessment and Plan    Annual Physical Exam   The routine annual physical exam shows no new complaints or significant changes in health status. Memory remains good. Appetite has decreased, consistent with age-related changes. Severe diarrhea and loss of appetite from Aricept led to its discontinuation, resolving symptoms. Perform routine lab work.  Emphysema   Breathing is well-managed with albuterol as needed. Due to lung disease, the RSV vaccine is advised and covered by Medicare at the pharmacy. Administer the RSV vaccine at the pharmacy and refill albuterol.  Hyperlipidemia   Cholesterol is managed with atorvastatin. Refill atorvastatin as requested.  Fall Risk   Reports of an unsteady gait and occasional cane use. Advise using a cane or walker to prevent falls. The son assists with mobility when needed.  Hearing Loss   Hearing aids are in use, but there is difficulty with volume and fitting. She was last checked a week ago. Contact the hearing aid technician for adjustments. The son assists with hearing aid placement.  General Health Maintenance   Mammograms and bone density scans are up to date. The importance of maintaining regular health screenings was discussed. Continue routine health screenings.  Follow-up   Follow-up in 6 months for a routine check-up.        Return in about 6 months (around 12/24/2023), or if symptoms worsen or fail to improve.    Donato Schultz, DO

## 2023-06-26 NOTE — Patient Instructions (Signed)
 Preventive Care 43 Years and Older, Female Preventive care refers to lifestyle choices and visits with your health care provider that can promote health and wellness. Preventive care visits are also called wellness exams. What can I expect for my preventive care visit? Counseling Your health care provider may ask you questions about your: Medical history, including: Past medical problems. Family medical history. Pregnancy and menstrual history. History of falls. Current health, including: Memory and ability to understand (cognition). Emotional well-being. Home life and relationship well-being. Sexual activity and sexual health. Lifestyle, including: Alcohol, nicotine or tobacco, and drug use. Access to firearms. Diet, exercise, and sleep habits. Work and work Astronomer. Sunscreen use. Safety issues such as seatbelt and bike helmet use. Physical exam Your health care provider will check your: Height and weight. These may be used to calculate your BMI (body mass index). BMI is a measurement that tells if you are at a healthy weight. Waist circumference. This measures the distance around your waistline. This measurement also tells if you are at a healthy weight and may help predict your risk of certain diseases, such as type 2 diabetes and high blood pressure. Heart rate and blood pressure. Body temperature. Skin for abnormal spots. What immunizations do I need?  Vaccines are usually given at various ages, according to a schedule. Your health care provider will recommend vaccines for you based on your age, medical history, and lifestyle or other factors, such as travel or where you work. What tests do I need? Screening Your health care provider may recommend screening tests for certain conditions. This may include: Lipid and cholesterol levels. Hepatitis C test. Hepatitis B test. HIV (human immunodeficiency virus) test. STI (sexually transmitted infection) testing, if you are at  risk. Lung cancer screening. Colorectal cancer screening. Diabetes screening. This is done by checking your blood sugar (glucose) after you have not eaten for a while (fasting). Mammogram. Talk with your health care provider about how often you should have regular mammograms. BRCA-related cancer screening. This may be done if you have a family history of breast, ovarian, tubal, or peritoneal cancers. Bone density scan. This is done to screen for osteoporosis. Talk with your health care provider about your test results, treatment options, and if necessary, the need for more tests. Follow these instructions at home: Eating and drinking  Eat a diet that includes fresh fruits and vegetables, whole grains, lean protein, and low-fat dairy products. Limit your intake of foods with high amounts of sugar, saturated fats, and salt. Take vitamin and mineral supplements as recommended by your health care provider. Do not drink alcohol if your health care provider tells you not to drink. If you drink alcohol: Limit how much you have to 0-1 drink a day. Know how much alcohol is in your drink. In the [place] one drink equals one 12 oz bottle of beer (355 mL), one 5 oz glass of wine (148 mL), or one 1 oz glass of hard liquor (44 mL). Lifestyle Brush your teeth every morning and night with fluoride toothpaste. Floss one time each day. Exercise for at least 30 minutes 5 or more days each week. Do not use any products that contain nicotine or tobacco. These products include cigarettes, chewing tobacco, and vaping devices, such as e-cigarettes. If you need help quitting, ask your health care provider. Do not use drugs. If you are sexually active, practice safe sex. Use a condom or other form of protection in order to prevent STIs. Take aspirin only as told by  your health care provider. Make sure that you understand how much to take and what form to take. Work with your health care provider to find out whether it  is safe and beneficial for you to take aspirin daily. Ask your health care provider if you need to take a cholesterol-lowering medicine (statin). Find healthy ways to manage stress, such as: Meditation, yoga, or listening to music. Journaling. Talking to a trusted person. Spending time with friends and family. Minimize exposure to UV radiation to reduce your risk of skin cancer. Safety Always wear your seat belt while driving or riding in a vehicle. Do not drive: If you have been drinking alcohol. Do not ride with someone who has been drinking. When you are tired or distracted. While texting. If you have been using any mind-altering substances or drugs. Wear a helmet and other protective equipment during sports activities. If you have firearms in your house, make sure you follow all gun safety procedures. What's next? Visit your health care provider once a year for an annual wellness visit. Ask your health care provider how often you should have your eyes and teeth checked. Stay up to date on all vaccines. This information is not intended to replace advice given to you by your health care provider. Make sure you discuss any questions you have with your health care provider. Document Revised: 10/21/2020 Document Reviewed: 10/21/2020 Elsevier Patient Education  2024 [employer].

## 2023-06-26 NOTE — Assessment & Plan Note (Signed)
 Well controlled, no changes to meds. Encouraged heart healthy diet such as the DASH diet and exercise as tolerated.

## 2023-06-26 NOTE — Assessment & Plan Note (Signed)
Con't inhaler F/u pulmonary Stable

## 2023-06-26 NOTE — Assessment & Plan Note (Signed)
 Check labs today.

## 2023-06-26 NOTE — Assessment & Plan Note (Signed)
Ghm utd Check labs  See AVS Health Maintenance  Topic Date Due   COVID-19 Vaccine (3 - Pfizer risk series) 09/23/2019   MAMMOGRAM  06/25/2024 (Originally 03/13/2018)   DEXA SCAN  06/25/2024 (Originally 10/07/2019)   Medicare Annual Wellness (AWV)  12/01/2023   DTaP/Tdap/Td (3 - Td or Tdap) 08/08/2027   Pneumonia Vaccine 61+ Years old  Completed   INFLUENZA VACCINE  Completed   Zoster Vaccines- Shingrix  Completed   HPV VACCINES  Aged Out  Pt prefers to not do mammograms/ bone densities etc

## 2023-06-26 NOTE — Assessment & Plan Note (Signed)
 Tolerating statin, encouraged heart healthy diet, avoid trans fats, minimize simple carbs and saturated fats. Increase exercise as tolerated

## 2023-06-29 ENCOUNTER — Encounter: Payer: Self-pay | Admitting: Family Medicine

## 2023-07-17 ENCOUNTER — Ambulatory Visit: Payer: Self-pay | Admitting: Family Medicine

## 2023-07-17 ENCOUNTER — Ambulatory Visit (INDEPENDENT_AMBULATORY_CARE_PROVIDER_SITE_OTHER): Admitting: Physician Assistant

## 2023-07-17 ENCOUNTER — Encounter: Payer: Self-pay | Admitting: Physician Assistant

## 2023-07-17 VITALS — BP 143/77 | HR 74 | Temp 97.7°F | Ht 64.0 in | Wt 143.6 lb

## 2023-07-17 DIAGNOSIS — R350 Frequency of micturition: Secondary | ICD-10-CM | POA: Diagnosis not present

## 2023-07-17 DIAGNOSIS — R8271 Bacteriuria: Secondary | ICD-10-CM | POA: Diagnosis not present

## 2023-07-17 LAB — POC URINALSYSI DIPSTICK (AUTOMATED)
Bilirubin, UA: NEGATIVE
Blood, UA: NEGATIVE
Glucose, UA: NEGATIVE
Ketones, UA: NEGATIVE
Nitrite, UA: NEGATIVE
Protein, UA: NEGATIVE
Spec Grav, UA: 1.01 (ref 1.010–1.025)
Urobilinogen, UA: 0.2 U/dL
pH, UA: 7 (ref 5.0–8.0)

## 2023-07-17 NOTE — Telephone Encounter (Signed)
  Chief Complaint: Urinary symptoms Symptoms: decreased output, pain with urination, diarrhea Frequency: 4 days Pertinent Negatives: Patient denies Fever, blood in urine, flank pain Disposition: [] ED /[] Urgent Care (no appt availability in office) / [x] Appointment(In office/virtual)/ []  [facility]/ [] Home Care/ [] Refused Recommended Disposition /[]  Mobile Bus/ []  Follow-up with PCP Additional Notes: Patient calls reporting urinary symptoms that began 4 days ago. Reports one episode of diarrhea last night, attributes it to the dinner she ate. Per protocol, patient to be evaluated within 24 hours. First available appointment with PCP outside of guideline. Patient scheduled with first available provider in clinic for today at 1340. Care advice reviewed, patient verbalized understandingand denies further questions at this time. Alerting PCP for review.    Reason for Disposition  Age > 50 years  Answer Assessment - Initial Assessment Questions 1. SYMPTOM: "What's the main symptom you're concerned about?" (e.g., frequency, incontinence)     Decreased output, pain with urination 2. ONSET: "When did the  symptoms  start?"     4 days 3. PAIN: "Is there any pain?" If Yes, ask: "How bad is it?" (Scale: 1-10; mild, moderate, severe)     Denies pain at this time 4. CAUSE: "What do you think is causing the symptoms?"     UTI 5. OTHER SYMPTOMS: "Do you have any other symptoms?" (e.g., blood in urine, fever, flank pain, pain with urination)     Pain with urination, decreased output, diarrhea  Answer Assessment - Initial Assessment Questions 1. SEVERITY: "How bad is the pain?"  (e.g., Scale 1-10; mild, moderate, or severe)   - MILD (1-3): complains slightly about urination hurting   - MODERATE (4-7): interferes with normal activities     - SEVERE (8-10): excruciating, unwilling or unable to urinate because of the pain      None at this time, reports mild when occuring 2.  FREQUENCY: "How many times have you had painful urination today?"      Last night 3. PATTERN: "Is pain present every time you urinate or just sometimes?"      Just sometimes 4. ONSET: "When did the painful urination start?"      4 days 5. FEVER: "Do you have a fever?" If Yes, ask: "What is your temperature, how was it measured, and when did it start?"     Denies 6. PAST UTI: "Have you had a urine infection before?" If Yes, ask: "When was the last time?" and "What happened that time?"      Yes, was treated with medication 7. CAUSE: "What do you think is causing the painful urination?"  (e.g., UTI, scratch, Herpes sore)     UTI 8. OTHER SYMPTOMS: "Do you have any other symptoms?" (e.g., blood in urine, flank pain, genital sores, urgency, vaginal discharge)     Decreased output, pain with urination, diarrhea  Protocols used: Urinary Symptoms-A-AH, Urination Pain - Female-A-AH

## 2023-07-17 NOTE — Progress Notes (Signed)
 Established patient visit   Patient: Carrie Hall   DOB: 15-Dec-1933   89 y.o. Female  MRN: [id number] Visit Date: 07/17/2023  Today's healthcare provider: Alfredia Ferguson, PA-C   Cc. Urinary frequency.  Subjective     Pt reports urinary frequency 'for a long time'. Over several weeks when asked to specify. Denies hematuria, abdominal pain, back pain.  She reports occasional episodes of diarrhea.   When asked what made her be seen today-- just stated she wanted to be checked out.  Medications: Outpatient Medications Prior to Visit  Medication Sig   acetaminophen (TYLENOL) 325 MG tablet Take 650 mg by mouth every 8 (eight) hours as needed for moderate pain.   albuterol (VENTOLIN HFA) 108 (90 Base) MCG/ACT inhaler Inhale 1-2 puffs into the lungs every 6 (six) hours as needed for wheezing or shortness of breath.   alendronate (FOSAMAX) 70 MG tablet Take 1 tablet (70 mg total) by mouth once a week. Take with a full glass of water on an empty stomach.   ALPRAZolam (XANAX) 0.25 MG tablet Take 1 tablet (0.25 mg total) by mouth at bedtime as needed for anxiety.   atorvastatin (LIPITOR) 20 MG tablet Take 1 tablet (20 mg total) by mouth daily.   budesonide-formoterol (SYMBICORT) 80-4.5 MCG/ACT inhaler Inhale 2 puffs into the lungs 2 (two) times daily.   folic acid (FOLVITE) 1 MG tablet Take 1 mg by mouth daily.   levothyroxine (SYNTHROID) 88 MCG tablet Take 1 tablet (88 mcg total) by mouth daily before breakfast.   methotrexate (RHEUMATREX) 2.5 MG tablet Take 12.5 mg by mouth once a week. On Wednesdays   Multiple Vitamins-Minerals (PRESERVISION AREDS 2 PO) Take 1 tablet by mouth 2 (two) times daily.   No facility-administered medications prior to visit.    Review of Systems  Constitutional:  Negative for fatigue and fever.  Respiratory:  Negative for cough and shortness of breath.   Cardiovascular:  Negative for chest pain and leg swelling.  Gastrointestinal:  Negative for  abdominal pain.  Genitourinary:  Positive for frequency.  Neurological:  Negative for dizziness and headaches.       Objective    BP (!) 143/77   Pulse 74   Temp 97.7 F (36.5 C)   Ht 5\' 4"  (1.626 m)   Wt 143 lb 9.6 oz (65.1 kg)   BMI 24.65 kg/m    Physical Exam Constitutional:      General: She is awake.     Appearance: She is well-developed.  HENT:     Head: Normocephalic.  Eyes:     Conjunctiva/sclera: Conjunctivae normal.  Cardiovascular:     Rate and Rhythm: Normal rate and regular rhythm.     Heart sounds: Normal heart sounds.  Pulmonary:     Effort: Pulmonary effort is normal.     Breath sounds: Normal breath sounds.  Abdominal:     Tenderness: There is no abdominal tenderness. There is no guarding.  Skin:    General: Skin is warm.  Neurological:     Mental Status: She is alert and oriented to person, place, and time.  Psychiatric:        Attention and Perception: Attention normal.        Mood and Affect: Mood normal.        Speech: Speech normal.        Behavior: Behavior is cooperative.     Results for orders placed or performed in visit on 07/17/23  POCT Urinalysis  Dipstick (Automated)  Result Value Ref Range   Color, UA yellow    Clarity, UA cloudy    Glucose, UA Negative Negative   Bilirubin, UA neg    Ketones, UA neg    Spec Grav, UA 1.010 1.010 - 1.025   Blood, UA neg    pH, UA 7.0 5.0 - 8.0   Protein, UA Negative Negative   Urobilinogen, UA 0.2 0.2 or 1.0 E.U./dL   Nitrite, UA neg    Leukocytes, UA Large (3+) (A) Negative    Assessment & Plan    Urinary frequency -     POCT Urinalysis Dipstick (Automated) -     Urine Culture  Bacteriuria -     Urine Culture   Given lack of acute symptoms, recent diarrhea that could contribute to leuks on UA -- will wait for culture to treat, but if pos culture will tx appropriately  Return if symptoms worsen or fail to improve.       Alfredia Ferguson, PA-C  [facility] at  [facility] [phone] (phone) [phone] (fax)  [facility]

## 2023-07-21 ENCOUNTER — Other Ambulatory Visit: Payer: Self-pay | Admitting: Physician Assistant

## 2023-07-21 DIAGNOSIS — N3000 Acute cystitis without hematuria: Secondary | ICD-10-CM

## 2023-07-21 LAB — URINE CULTURE
MICRO NUMBER:: 16180167
SPECIMEN QUALITY:: ADEQUATE

## 2023-07-21 MED ORDER — CEPHALEXIN 500 MG PO CAPS: 500.0000 mg | ORAL_CAPSULE | Freq: Two times a day (BID) | ORAL | 0 refills | Status: AC

## 2023-07-26 DIAGNOSIS — L408 Other psoriasis: Secondary | ICD-10-CM | POA: Diagnosis not present

## 2023-07-28 ENCOUNTER — Telehealth: Payer: Self-pay

## 2023-07-28 NOTE — Telephone Encounter (Signed)
 Copied from CRM [id number]. Topic: Clinical - Request for Lab/Test Order >> Jul 28, 2023  8:08 AM Truddie Crumble wrote: Reason for CRM: tamara from [facility] called stating they sent over a request for the patient most recent labs on 3/19 and have not received it back  CB [phone] Fax [phone]

## 2023-07-28 NOTE — Telephone Encounter (Signed)
 Labs faxed

## 2023-08-01 DIAGNOSIS — H43813 Vitreous degeneration, bilateral: Secondary | ICD-10-CM | POA: Diagnosis not present

## 2023-08-01 DIAGNOSIS — H353133 Nonexudative age-related macular degeneration, bilateral, advanced atrophic without subfoveal involvement: Secondary | ICD-10-CM | POA: Diagnosis not present

## 2023-08-01 DIAGNOSIS — H353221 Exudative age-related macular degeneration, left eye, with active choroidal neovascularization: Secondary | ICD-10-CM | POA: Diagnosis not present

## 2023-08-04 ENCOUNTER — Emergency Department (HOSPITAL_BASED_OUTPATIENT_CLINIC_OR_DEPARTMENT_OTHER)
Admission: EM | Admit: 2023-08-04 | Discharge: 2023-08-04 | Disposition: A | Discharge status: Home / Self Care | Attending: Emergency Medicine | Admitting: Emergency Medicine

## 2023-08-04 ENCOUNTER — Emergency Department (HOSPITAL_BASED_OUTPATIENT_CLINIC_OR_DEPARTMENT_OTHER)

## 2023-08-04 ENCOUNTER — Encounter (HOSPITAL_BASED_OUTPATIENT_CLINIC_OR_DEPARTMENT_OTHER): Payer: Self-pay | Admitting: Emergency Medicine

## 2023-08-04 ENCOUNTER — Other Ambulatory Visit: Payer: Self-pay

## 2023-08-04 DIAGNOSIS — R0602 Shortness of breath: Secondary | ICD-10-CM | POA: Insufficient documentation

## 2023-08-04 DIAGNOSIS — J439 Emphysema, unspecified: Secondary | ICD-10-CM | POA: Diagnosis not present

## 2023-08-04 DIAGNOSIS — Z7951 Long term (current) use of inhaled steroids: Secondary | ICD-10-CM | POA: Insufficient documentation

## 2023-08-04 DIAGNOSIS — R631 Polydipsia: Secondary | ICD-10-CM | POA: Diagnosis not present

## 2023-08-04 DIAGNOSIS — Z87891 Personal history of nicotine dependence: Secondary | ICD-10-CM | POA: Diagnosis not present

## 2023-08-04 DIAGNOSIS — J449 Chronic obstructive pulmonary disease, unspecified: Secondary | ICD-10-CM | POA: Diagnosis not present

## 2023-08-04 DIAGNOSIS — Z853 Personal history of malignant neoplasm of breast: Secondary | ICD-10-CM | POA: Diagnosis not present

## 2023-08-04 LAB — POCT I-STAT, CHEM 8
BUN: 22 mg/dL (ref 8–23)
Calcium, Ion: 1.15 mmol/L (ref 1.15–1.40)
Chloride: 97 mmol/L — ABNORMAL LOW (ref 98–111)
Creatinine, Ser: 0.8 mg/dL (ref 0.44–1.00)
Glucose, Bld: 100 mg/dL — ABNORMAL HIGH (ref 70–99)
HCT: 39 % (ref 36.0–46.0)
Hemoglobin: 13.3 g/dL (ref 12.0–15.0)
Potassium: 4.1 mmol/L (ref 3.5–5.1)
Sodium: 135 mmol/L (ref 135–145)
TCO2: 26 mmol/L (ref 22–32)

## 2023-08-04 LAB — CBC
HCT: 39.3 % (ref 36.0–46.0)
Hemoglobin: 13.3 g/dL (ref 12.0–15.0)
MCH: 30.5 pg (ref 26.0–34.0)
MCHC: 33.8 g/dL (ref 30.0–36.0)
MCV: 90.1 fL (ref 80.0–100.0)
Platelets: 206 10*3/uL (ref 150–400)
RBC: 4.36 MIL/uL (ref 3.87–5.11)
RDW: 13.5 % (ref 11.5–15.5)
WBC: 7.7 10*3/uL (ref 4.0–10.5)
nRBC: 0 % (ref 0.0–0.2)

## 2023-08-04 LAB — CBG MONITORING, ED: Glucose-Capillary: 92 mg/dL (ref 70–99)

## 2023-08-04 MED ORDER — IPRATROPIUM-ALBUTEROL 0.5-2.5 (3) MG/3ML IN SOLN
3.0000 mL | Freq: Once | RESPIRATORY_TRACT | Status: AC
  Administered 2023-08-04: 3 mL via RESPIRATORY_TRACT
  Filled 2023-08-04: qty 3

## 2023-08-04 MED ORDER — ALBUTEROL SULFATE (2.5 MG/3ML) 0.083% IN NEBU
2.5000 mg | INHALATION_SOLUTION | Freq: Once | RESPIRATORY_TRACT | Status: AC
  Administered 2023-08-04: 2.5 mg via RESPIRATORY_TRACT
  Filled 2023-08-04: qty 3

## 2023-08-04 MED ORDER — ALBUTEROL SULFATE HFA 108 (90 BASE) MCG/ACT IN AERS: 2.0000 | INHALATION_SPRAY | RESPIRATORY_TRACT | Status: DC | PRN

## 2023-08-04 NOTE — Discharge Instructions (Signed)
 Make an appointment to follow-up with your doctor to be rechecked next week.  Today's labs and vital signs and chest x-ray all very normal.  Return for any new or worse symptoms.

## 2023-08-04 NOTE — ED Notes (Signed)
 In to see Pt. After she has been assessed by Resp.  Pt. In no distress.  Pt. Reports she is feeling much better after her breathing treatments.

## 2023-08-04 NOTE — ED Triage Notes (Signed)
 Pt reports increased thirst since this afternoon, polyuria, denies polyphagia; denies hx of diabetes  Pt also c/o of some anxiety and pt sounds tight when breathing, RT called to assess, reports wheezing and

## 2023-08-04 NOTE — ED Provider Notes (Signed)
 [facility] AT [facility] Provider Note   CSN: [id number] Arrival date & time: 08/04/23  2007     History  Chief Complaint  Patient presents with   Shortness of Breath   Polydipsia    Carrie Hall is a 89 y.o. female.  Patient came in because she has had increased thirst today.  Felt like she had needed the drink a lot.  Patient really had no other complaint.  Patient does have a COPD and emphysema history but patient was assessed by respiratory therapy out front.  Patient with no wheezing back here.  Oxygen sats have all been good.  Temp of 98 degrees pulse 64 respirations 18 blood pressure 154/64 oxygen sats are 96% on room air higher.  Past medical history patient is on Synthroid.  Patient does have an albuterol inhaler.  Past medical history of thyroid disease hyperlipidemia COPD history of breast cancer.  Patient former smoker quit in 2005.  Patient nontoxic no acute distress.  Patient is hard of hearing at baseline.  Did not bring her hearing aids.       Home Medications Prior to Admission medications   Medication Sig Start Date End Date Taking? Authorizing Provider  acetaminophen (TYLENOL) 325 MG tablet Take 650 mg by mouth every 8 (eight) hours as needed for moderate pain.    [provider]  albuterol (VENTOLIN HFA) 108 (90 Base) MCG/ACT inhaler Inhale 1-2 puffs into the lungs every 6 (six) hours as needed for wheezing or shortness of breath. 06/26/23   Donato Schultz, DO  alendronate (FOSAMAX) 70 MG tablet Take 1 tablet (70 mg total) by mouth once a week. Take with a full glass of water on an empty stomach. 02/22/23   Donato Schultz, DO  ALPRAZolam (XANAX) 0.25 MG tablet Take 1 tablet (0.25 mg total) by mouth at bedtime as needed for anxiety. 02/22/23   Donato Schultz, DO  atorvastatin (LIPITOR) 20 MG tablet Take 1 tablet (20 mg total) by mouth daily. 06/26/23   Donato Schultz, DO  budesonide-formoterol  (SYMBICORT) 80-4.5 MCG/ACT inhaler Inhale 2 puffs into the lungs 2 (two) times daily. 03/08/23   Donato Schultz, DO  folic acid (FOLVITE) 1 MG tablet Take 1 mg by mouth daily.    [provider]  levothyroxine (SYNTHROID) 88 MCG tablet Take 1 tablet (88 mcg total) by mouth daily before breakfast. 04/25/23   Zola Button, Grayling Congress, DO  methotrexate (RHEUMATREX) 2.5 MG tablet Take 12.5 mg by mouth once a week. On Wednesdays 11/09/20   [provider]  Multiple Vitamins-Minerals (PRESERVISION AREDS 2 PO) Take 1 tablet by mouth 2 (two) times daily.    [provider]      Allergies    Pneumococcal vaccines and Donepezil hcl    Review of Systems   Review of Systems  Constitutional:  Negative for chills and fever.  HENT:  Negative for ear pain and sore throat.   Eyes:  Negative for pain and visual disturbance.  Respiratory:  Negative for cough and shortness of breath.   Cardiovascular:  Negative for chest pain and palpitations.  Gastrointestinal:  Negative for abdominal pain and vomiting.  Endocrine: Positive for polydipsia.  Genitourinary:  Negative for dysuria and hematuria.  Musculoskeletal:  Negative for arthralgias and back pain.  Skin:  Negative for color change and rash.  Neurological:  Negative for seizures and syncope.  All other systems reviewed and are  negative.   Physical Exam Updated Vital Signs BP (!) 159/85   Pulse 73   Temp 98 F (36.7 C)   Resp 16   Ht 1.626 m (5\' 4" )   Wt 65.1 kg   SpO2 94%   BMI 24.64 kg/m  Physical Exam Vitals and nursing note reviewed.  Constitutional:      General: She is not in acute distress.    Appearance: Normal appearance. She is well-developed. She is not ill-appearing.  HENT:     Head: Normocephalic and atraumatic.     Mouth/Throat:     Mouth: Mucous membranes are moist.     Pharynx: Oropharynx is clear.     Comments: Mucous membranes moist. Eyes:     Conjunctiva/sclera: Conjunctivae normal.   Cardiovascular:     Rate and Rhythm: Normal rate and regular rhythm.     Heart sounds: No murmur heard. Pulmonary:     Effort: Pulmonary effort is normal. No respiratory distress.     Breath sounds: No stridor. No wheezing, rhonchi or rales.  Chest:     Chest wall: No tenderness.  Abdominal:     Palpations: Abdomen is soft.     Tenderness: There is no abdominal tenderness.  Musculoskeletal:        General: No swelling.     Cervical back: Neck supple.  Skin:    General: Skin is warm and dry.     Capillary Refill: Capillary refill takes less than 2 seconds.  Neurological:     General: No focal deficit present.     Mental Status: She is alert. Mental status is at baseline.  Psychiatric:        Mood and Affect: Mood normal.     ED Results / Procedures / Treatments   Labs (all labs ordered are listed, but only abnormal results are displayed) Labs Reviewed  POCT I-STAT, CHEM 8 - Abnormal; Notable for the following components:      Result Value   Chloride 97 (*)    Glucose, Bld 100 (*)    All other components within normal limits  CBC  CBG MONITORING, ED  CBG MONITORING, ED    EKG EKG Interpretation Date/Time:  Friday August 04 2023 20:42:38 EDT Ventricular Rate:  72 PR Interval:  214 QRS Duration:  96 QT Interval:  398 QTC Calculation: 435 R Axis:   70  Text Interpretation: Sinus rhythm with 1st degree A-V block with Premature atrial complexes Otherwise normal ECG When compared with ECG of 08-Mar-2021 13:49, PREVIOUS ECG IS PRESENT No significant change since last tracing Confirmed by Vanetta Mulders [phone]) on 08/04/2023 10:19:24 PM  Radiology DG Chest 2 View Result Date: 08/04/2023 CLINICAL DATA:  COPD EXAM: CHEST - 2 VIEW COMPARISON:  None Available. FINDINGS: The lungs are symmetrically hyperinflated in keeping with changes of underlying COPD. Lungs are clear. No pneumothorax or pleural effusion. Cardiac size within normal limits. Pulmonary vascularity is normal.  Osseous structures are age-appropriate. No acute bone abnormality. IMPRESSION: 1. COPD. No active cardiopulmonary disease. Electronically Signed   By: Helyn Numbers M.D.   On: 08/04/2023 22:04    Procedures Procedures    Medications Ordered in ED Medications  albuterol (VENTOLIN HFA) 108 (90 Base) MCG/ACT inhaler 2 puff (has no administration in time range)  ipratropium-albuterol (DUONEB) 0.5-2.5 (3) MG/3ML nebulizer solution 3 mL (3 mLs Nebulization Given 08/04/23 2044)  albuterol (PROVENTIL) (2.5 MG/3ML) 0.083% nebulizer solution 2.5 mg (2.5 mg Nebulization Given 08/04/23 2044)    ED Course/ Medical  Decision Making/ A&P                                 Medical Decision Making Amount and/or Complexity of Data Reviewed Labs: ordered. Radiology: ordered.  Risk Prescription drug management.   Patient nontoxic no acute distress.  Lungs are clear.  I-STAT without significant electrolyte abnormalities blood sugars 100.  Renal function normal.  CBC no leukocytosis hemoglobin good blood sugar prior fingerstick was 92.  Chest x-ray COPD no active cardiopulmonary disease.  EKG without any acute changes. Final Clinical Impression(s) / ED Diagnoses Final diagnoses:  Increased thirst    Rx / DC Orders ED Discharge Orders     None         Vanetta Mulders, MD 08/04/23 2233

## 2023-08-08 ENCOUNTER — Telehealth: Payer: Self-pay

## 2023-08-08 NOTE — Transitions of Care (Post Inpatient/ED Visit) (Signed)
   08/08/2023  Name: Carrie Hall MRN: [id number] DOB: 31-Mar-1934  Today's TOC FU Call Status: Today's TOC FU Call Status:: Successful TOC FU Call Completed TOC FU Call Complete Date: 08/08/23 Patient's Name and Date of Birth confirmed.  Transition Care Management Follow-up Telephone Call Date of Discharge: 08/04/23 Discharge Facility: [facility] Type of Discharge: Emergency Department Reason for ED Visit: Other: (Increased Thirst) How have you been since you were released from the hospital?: Better Any questions or concerns?: No  Items Reviewed: Did you receive and understand the discharge instructions provided?: Yes Medications obtained,verified, and reconciled?: Yes (Medications Reviewed) Any new allergies since your discharge?: No Dietary orders reviewed?: NA Do you have support at home?: Yes  Medications Reviewed Today: Medications Reviewed Today     Reviewed by Anthoney Harada, LPN (Licensed Practical Nurse) on 08/08/23 at 1411  Med List Status: <None>   Medication Order Taking? Sig Documenting Provider Last Dose Status Informant  acetaminophen (TYLENOL) 325 MG tablet [id number] Yes Take 650 mg by mouth every 8 (eight) hours as needed for moderate pain. [provider] Taking Active   albuterol (VENTOLIN HFA) 108 (90 Base) MCG/ACT inhaler [id number] Yes Inhale 1-2 puffs into the lungs every 6 (six) hours as needed for wheezing or shortness of breath. Donato Schultz, DO Taking Active   alendronate (FOSAMAX) 70 MG tablet [id number] Yes Take 1 tablet (70 mg total) by mouth once a week. Take with a full glass of water on an empty stomach. Donato Schultz, DO Taking Active   ALPRAZolam Prudy Feeler) 0.25 MG tablet [id number] Yes Take 1 tablet (0.25 mg total) by mouth at bedtime as needed for anxiety. Seabron Spates R, DO Taking Active   atorvastatin (LIPITOR) 20 MG tablet [id number] Yes Take 1 tablet (20 mg total) by mouth daily. Donato Schultz, DO  Taking Active   budesonide-formoterol [facility]) 80-4.5 MCG/ACT inhaler [id number] Yes Inhale 2 puffs into the lungs 2 (two) times daily. Seabron Spates R, DO Taking Active   folic acid (FOLVITE) 1 MG tablet [id number] Yes Take 1 mg by mouth daily. [provider] Taking Active   levothyroxine (SYNTHROID) 88 MCG tablet [id number] Yes Take 1 tablet (88 mcg total) by mouth daily before breakfast. Zola Button, Grayling Congress, DO Taking Active   methotrexate (RHEUMATREX) 2.5 MG tablet [id number] Yes Take 12.5 mg by mouth once a week. On Wednesdays [provider] Taking Active   Multiple Vitamins-Minerals (PRESERVISION AREDS 2 PO) [id number] Yes Take 1 tablet by mouth 2 (two) times daily. [provider] Taking Active             Home Care and Equipment/Supplies: Were Home Health Services Ordered?: NA Any new equipment or medical supplies ordered?: NA  Functional Questionnaire: Do you need assistance with bathing/showering or dressing?: No Do you need assistance with meal preparation?: No Do you need assistance with eating?: No Do you have difficulty maintaining continence: No Do you need assistance with getting out of bed/getting out of a chair/moving?: No Do you have difficulty managing or taking your medications?: No  Follow up appointments reviewed: PCP Follow-up appointment confirmed?: NA Specialist Hospital Follow-up appointment confirmed?: NA Do you need transportation to your follow-up appointment?: No Do you understand care options if your condition(s) worsen?: Yes-patient verbalized understanding    SIGNATURE Kandis Fantasia, LPN [facility] Health Advisor [place] [facility] You Are. We Are. One [facility] Direct Dial [phone]

## 2023-09-20 ENCOUNTER — Other Ambulatory Visit: Payer: Self-pay | Admitting: Family Medicine

## 2023-09-20 DIAGNOSIS — F411 Generalized anxiety disorder: Secondary | ICD-10-CM

## 2023-09-20 NOTE — Telephone Encounter (Signed)
 Requesting: alprazolam  0.25mg   Contract: 12/22/22 UDS: 12/22/22 Last Visit: 06/26/23 Next Visit: 12/25/23 Last Refill: 02/22/23 #30 and 2RF   Please Advise

## 2023-09-26 DIAGNOSIS — H353133 Nonexudative age-related macular degeneration, bilateral, advanced atrophic without subfoveal involvement: Secondary | ICD-10-CM | POA: Diagnosis not present

## 2023-09-26 DIAGNOSIS — H353222 Exudative age-related macular degeneration, left eye, with inactive choroidal neovascularization: Secondary | ICD-10-CM | POA: Diagnosis not present

## 2023-09-26 DIAGNOSIS — H43813 Vitreous degeneration, bilateral: Secondary | ICD-10-CM | POA: Diagnosis not present

## 2023-10-09 ENCOUNTER — Other Ambulatory Visit: Payer: Self-pay | Admitting: Family Medicine

## 2023-10-09 DIAGNOSIS — J441 Chronic obstructive pulmonary disease with (acute) exacerbation: Secondary | ICD-10-CM

## 2023-12-15 ENCOUNTER — Ambulatory Visit: Admitting: *Deleted

## 2023-12-15 ENCOUNTER — Telehealth: Payer: Self-pay | Admitting: *Deleted

## 2023-12-15 VITALS — Ht 64.0 in | Wt 143.0 lb

## 2023-12-15 DIAGNOSIS — Z0000 Encounter for general adult medical examination without abnormal findings: Secondary | ICD-10-CM

## 2023-12-15 NOTE — Progress Notes (Signed)
 Subjective:   Carrie Hall is a 89 y.o. who presents for a Medicare Wellness preventive visit.  As a reminder, Annual Wellness Visits don't include a physical exam, and some assessments may be limited, especially if this visit is performed virtually. We may recommend an in-person follow-up visit with your provider if needed.  Visit Complete: Virtual I connected with  Carrie Hall on 12/15/23 by a audio enabled telemedicine application and verified that I am speaking with the correct person using two identifiers.  Patient Location: Home  Provider Location: Office/Clinic  I discussed the limitations of evaluation and management by telemedicine. The patient expressed understanding and agreed to proceed.  Vital Signs: Because this visit was a virtual/telehealth visit, some criteria may be missing or patient reported. Any vitals not documented were not able to be obtained and vitals that have been documented are patient reported.  VideoDeclined- This patient declined Librarian, academic. Therefore the visit was completed with audio only.  Persons Participating in Visit: Patient.  AWV Questionnaire: No: Patient Medicare AWV questionnaire was not completed prior to this visit.  Cardiac Risk Factors include: advanced age (>64men, >39 women);dyslipidemia;hypertension;Other (see comment), Risk factor comments: COPD, History of breast cancer     Objective:    Today's Vitals   12/15/23 1303  Weight: 143 lb (64.9 kg)  Height: 5' 4 (1.626 m)   Body mass index is 24.55 kg/m.     12/15/2023    1:21 PM 08/04/2023    8:41 PM 12/01/2022    1:26 PM 11/29/2021   11:04 AM 06/14/2021    9:11 AM 03/10/2021    8:10 AM 03/08/2021    1:45 PM  Advanced Directives  Does Patient Have a Medical Advance Directive? No No No Yes No No No  Type of Theme park manager;Living will     Copy of Healthcare Power of Attorney in Chart?    No - copy  requested     Would patient like information on creating a medical advance directive? No - Patient declined No - Patient declined Yes (MAU/Ambulatory/Procedural Areas - Information given)    No - Patient declined    Current Medications (verified) Outpatient Encounter Medications as of 12/15/2023  Medication Sig   acetaminophen (TYLENOL) 325 MG tablet Take 650 mg by mouth every 8 (eight) hours as needed for moderate pain.   albuterol  (VENTOLIN  HFA) 108 (90 Base) MCG/ACT inhaler Inhale 1-2 puffs into the lungs every 6 (six) hours as needed for wheezing or shortness of breath.   alendronate  (FOSAMAX ) 70 MG tablet Take 1 tablet (70 mg total) by mouth once a week. Take with a full glass of water on an empty stomach.   ALPRAZolam  (XANAX ) 0.25 MG tablet TAKE 1 TABLET BY MOUTH AT BEDTIME AS NEEDED FOR ANXIETY   atorvastatin  (LIPITOR) 20 MG tablet Take 1 tablet (20 mg total) by mouth daily.   budesonide -formoterol  (BREYNA ) 80-4.5 MCG/ACT inhaler Inhale 2 puffs into the lungs 2 (two) times daily.   folic acid (FOLVITE) 1 MG tablet Take 1 mg by mouth daily.   levothyroxine  (SYNTHROID ) 88 MCG tablet Take 1 tablet (88 mcg total) by mouth daily before breakfast.   methotrexate  (RHEUMATREX) 2.5 MG tablet Take 12.5 mg by mouth once a week. On Wednesdays   Multiple Vitamins-Minerals (PRESERVISION AREDS 2 PO) Take 1 tablet by mouth 2 (two) times daily.   No facility-administered encounter medications on file as of 12/15/2023.  Allergies (verified) Pneumococcal vaccines and Donepezil  hcl   History: Past Medical History:  Diagnosis Date   Adenomatous colon polyp    Breast cancer (HCC)    COPD (chronic obstructive pulmonary disease) (HCC)    Diverticulosis    Hyperlipidemia    Internal hemorrhoids    Macular degeneration 2025   left eye, followed by Selinda Slocumb.   Osteopenia    Thyroid  disease    Past Surgical History:  Procedure Laterality Date   BREAST LUMPECTOMY Left    radiation only    BREAST SURGERY     colon polyps     Family History  Problem Relation Age of Onset   Alzheimer's disease Mother    Cancer Father        lung   Alzheimer's disease Maternal Grandmother    Rectal cancer Sister        in her 58s   Social History   Socioeconomic History   Marital status: Widowed    Spouse name: Not on file   Number of children: 2   Years of education: Not on file   Highest education level: Not on file  Occupational History   Occupation: retired - homemaker  Tobacco Use   Smoking status: Former    Current packs/day: 0.00    Average packs/day: 0.5 packs/day for 48.6 years (24.3 ttl pk-yrs)    Types: Cigarettes    Start date: 41    Quit date: 12/06/2003    Years since quitting: 20.0   Smokeless tobacco: Never  Vaping Use   Vaping status: Never Used  Substance and Sexual Activity   Alcohol use: No   Drug use: No   Sexual activity: Never    Partners: Male  Other Topics Concern   Not on file  Social History Narrative   Pt lives alone in 1 story home   Has 2 sons that live nearby   9th grade education   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   Right handed    Social Drivers of Health   Financial Resource Strain: Low Risk  (12/15/2023)   Overall Financial Resource Strain (CARDIA)    Difficulty of Paying Living Expenses: Not hard at all  Food Insecurity: No Food Insecurity (12/15/2023)   Hunger Vital Sign    Worried About Running Out of Food in the Last Year: Never true    Ran Out of Food in the Last Year: Never true  Transportation Needs: No Transportation Needs (12/15/2023)   PRAPARE - Administrator, Civil Service (Medical): No    Lack of Transportation (Non-Medical): No  Physical Activity: Insufficiently Active (12/15/2023)   Exercise Vital Sign    Days of Exercise per Week: 5 days    Minutes of Exercise per Session: 10 min  Stress: No Stress Concern Present (12/15/2023)   [employer] of Occupational Health - Occupational Stress  Questionnaire    Feeling of Stress: Not at all  Social Connections: Moderately Isolated (12/15/2023)   Social Connection and Isolation Panel    Frequency of Communication with Friends and Family: Twice a week    Frequency of Social Gatherings with Friends and Family: More than three times a week    Attends Religious Services: More than 4 times per year    Active Member of [employer] or Organizations: No    Attends Banker Meetings: Never    Marital Status: Widowed    Tobacco Counseling Counseling given: Not Answered    Clinical  Intake:  Pre-visit preparation completed: Yes  Pain : No/denies pain     BMI - recorded: 24.55 Nutritional Status: BMI of 19-24  Normal Nutritional Risks: None Diabetes: No  Lab Results  Component Value Date   HGBA1C 6.1 07/20/2012   HGBA1C 6.3 11/17/2008   HGBA1C 6.3 (H) 07/15/2008     How often do you need to have someone help you when you read instructions, pamphlets, or other written materials from your doctor or pharmacy?: 1 - Never What is the last grade level you completed in school?: 9th grade  Interpreter Needed?: No  Information entered by :: Carrie Hall, CMA   Activities of Daily Living     12/15/2023    1:11 PM  In your present state of health, do you have any difficulty performing the following activities:  Hearing? 1  Vision? 0  Difficulty concentrating or making decisions? 1  Walking or climbing stairs? 0  Dressing or bathing? 0  Doing errands, shopping? 0  Preparing Food and eating ? N  Using the Toilet? N  In the past six months, have you accidently leaked urine? Y  Comment wears depends. Occurs during the day and night.  Do you have problems with loss of bowel control? Y  Comment has intermittent diarrhea due to medication side effect  Managing your Medications? N  Managing your Finances? N  Housekeeping or managing your Housekeeping? N    Patient Care Team: Antonio Meth, Jamee SAUNDERS, DO as PCP -  General Hollar, Carlin Bullard, MD as Referring Physician (Dermatology) Carla Milling, RPH-CPP (Pharmacist) Jarold Mayo, MD as Consulting Physician (Ophthalmology)  I have updated your Care Teams any recent Medical Services you may have received from other providers in the past year.     Assessment:   This is a routine wellness examination for Carrie Hall.  Hearing/Vision screen Hearing Screening - Comments:: Has hearing aid Vision Screening - Comments:: Sees Jason Sanders/[facility]. Has macular degeneration lt eye.   Goals Addressed   None    Depression Screen     12/15/2023    1:17 PM 06/26/2023    8:59 AM 12/01/2022    1:22 PM 06/21/2022   10:34 AM 11/29/2021   11:08 AM 11/27/2020    8:21 AM 11/26/2020    9:06 AM  PHQ 2/9 Scores  PHQ - 2 Score 0 0 0 0 0 0 0  PHQ- 9 Score 3  0        Fall Risk     12/15/2023    1:07 PM 06/26/2023    8:59 AM 12/01/2022    1:28 PM 06/21/2022   10:34 AM 11/29/2021   11:06 AM  Fall Risk   Falls in the past year? 0 0 1 0 0  Number falls in past yr: 0 0 0 0 0  Injury with Fall? 0 0 0 0 0  Risk for fall due to : No Fall Risks  History of fall(s);Impaired balance/gait    Follow up Education provided Falls evaluation completed  Falls evaluation completed Falls prevention discussed      Data saved with a previous flowsheet row definition    MEDICARE RISK AT HOME:  Medicare Risk at Home Any stairs in or around the home?: No Home free of loose throw rugs in walkways, pet beds, electrical cords, etc?: Yes Adequate lighting in your home to reduce risk of falls?: Yes Life alert?: No Use of a cane, walker or w/c?: Yes (cane) Grab bars in the bathroom?: Yes  Shower chair or bench in shower?: No Elevated toilet seat or a handicapped toilet?: No  TIMED UP AND GO:  Was the test performed?  No,audio  Cognitive Function: Impaired: Patient has current diagnosis of cognitive impairment.    06/14/2021    8:00 PM 03/05/2020    8:00 AM 09/02/2019     8:00 AM 11/01/2017   10:00 AM 10/27/2017    8:19 AM  MMSE - Mini Mental State Exam  Orientation to time 5 4 3 5 4   Orientation to Place 4 4 4 4 5   Registration 3 3 3 3 3   Attention/ Calculation 0 2 0 3 3  Recall 3 3 3 1 1   Language- name 2 objects 2 2 2 2 2   Language- repeat 1 0 1 1 1   Language- follow 3 step command 3 3 2 3 3   Language- read & follow direction 1 1 1 1 1   Write a sentence 1 0 0 1 1  Copy design 1 1 1 1 1   Total score 24 23 20 25 25       01/31/2019    9:00 AM 06/22/2018   12:00 PM  Montreal Cognitive Assessment   Visuospatial/ Executive (0/5)  3  Naming (0/3)  3  Attention: Read list of digits (0/2) 1 1  Attention: Read list of letters (0/1) 0 0  Attention: Serial 7 subtraction starting at 100 (0/3) 0 0  Language: Repeat phrase (0/2) 1 0  Language : Fluency (0/1) 0 0  Abstraction (0/2) 0 0  Delayed Recall (0/5) 3 0  Orientation (0/6) 4 6  Total  13  Adjusted Score (based on education)  14      12/01/2022    1:33 PM 11/13/2019    1:59 PM  6CIT Screen  What Year? 0 points 0 points  What month? 0 points 0 points  What time? 0 points 0 points  Count back from 20 0 points 0 points  Months in reverse 4 points --  Repeat phrase 0 points 0 points  Total Score 4 points   Pt declined today.  Immunizations Immunization History  Administered Date(s) Administered   Fluad Quad(high Dose 65+) 03/11/2019, 03/23/2020, 02/02/2021, 02/24/2022   Influenza Split 03/15/2011   Influenza Whole 03/21/2007, 02/04/2008, 02/25/2009, 03/11/2010, 02/07/2012   Influenza, High Dose Seasonal PF 03/07/2014, 01/28/2016, 02/22/2017, 01/13/2018   Influenza,inj,Quad PF,6+ Mos 02/14/2013, 02/26/2015   Influenza-Unspecified 12/24/2022   PFIZER(Purple Top)SARS-COV-2 Vaccination 08/01/2019, 08/26/2019   Pneumococcal Conjugate-13 03/07/2014   Pneumococcal Polysaccharide-23 05/15/2006, 12/11/2007, 02/12/2018   Respiratory Syncytial Virus Vaccine ,Recomb Aduvanted(Arexvy ) 06/26/2023   Td  02/24/2004   Tdap 08/07/2017   Zoster Recombinant(Shingrix) 12/07/2020, 04/20/2021   Zoster, Live 11/02/2009    Screening Tests Health Maintenance  Topic Date Due   INFLUENZA VACCINE  12/08/2023   MAMMOGRAM  06/25/2024 (Originally 03/13/2018)   DEXA SCAN  06/25/2024 (Originally 10/07/2019)   COVID-19 Vaccine (3 - Pfizer risk series) 12/14/2024 (Originally 09/23/2019)   Medicare Annual Wellness (AWV)  12/14/2024   DTaP/Tdap/Td (3 - Td or Tdap) 08/08/2027   Pneumococcal Vaccine: 50+ Years  Completed   Zoster Vaccines- Shingrix  Completed   Hepatitis B Vaccines  Aged Out   HPV VACCINES  Aged Out   Meningococcal B Vaccine  Aged Out    Health Maintenance  Health Maintenance Due  Topic Date Due   INFLUENZA VACCINE  12/08/2023   Health Maintenance Items Addressed: Will get flu vaccine at pharmacy.  Additional Screening:  Vision Screening: Recommended annual ophthalmology  exams for early detection of glaucoma and other disorders of the eye. Would you like a referral to an eye doctor? No    Dental Screening: Recommended annual dental exams for proper oral hygiene  Community Resource Referral / Chronic Care Management: CRR required this visit?  No   CCM required this visit?  No   Plan:    I have personally reviewed and noted the following in the patient's chart:   Medical and social history Use of alcohol, tobacco or illicit drugs  Current medications and supplements including opioid prescriptions. Patient is not currently taking opioid prescriptions. Functional ability and status Nutritional status Physical activity Advanced directives List of other physicians Hospitalizations, surgeries, and ER visits in previous 12 months Vitals Screenings to include cognitive, depression, and falls Referrals and appointments  In addition, I have reviewed and discussed with patient certain preventive protocols, quality metrics, and best practice recommendations. A written  personalized care plan for preventive services as well as general preventive health recommendations were provided to patient.   Carrie Hall, CMA   12/15/2023   After Visit Summary: (MyChart) Due to this being a telephonic visit, the after visit summary with patients personalized plan was offered to patient via MyChart   Notes: see phone note

## 2023-12-15 NOTE — Telephone Encounter (Signed)
 Pt had AWV today.  Mentions urinary leakage during the day and at night. Wears depends. Notes that she becomes full before she has eaten her meal. Would like to discuss at next OV, 12/25/23.  Also reports macular degeneration in left eye and I have added to her past medical history.

## 2023-12-15 NOTE — Patient Instructions (Signed)
 Ms. Rutan , Thank you for taking time out of your busy schedule to complete your Annual Wellness Visit with me. I enjoyed our conversation and look forward to speaking with you again next year. I, as well as your care team,  appreciate your ongoing commitment to your health goals. Please review the following plan we discussed and let me know if I can assist you in the future. Your Game plan/ To Do List   Happy early Dortha! Hope you get to celebrate with the family!  Follow up Visits: Next Medicare AWV with our clinical staff: 12/18/24 1pm    Next Office Visit with your provider: 12/25/23 8:20am. (Please discuss your urine leakage and feeling full before you finish your meal.)  Clinician Recommendations:  Aim for 30 minutes of exercise or brisk walking, 6-8 glasses of water, and 5 servings of fruits and vegetables each day.   You will need to get the following vaccines at your local pharmacy: Flu       This is a list of the screening recommended for you and due dates:  Health Maintenance  Topic Date Due   Medicare Annual Wellness Visit  12/01/2023   Flu Shot  12/08/2023   Mammogram  06/25/2024*   DEXA scan (bone density measurement)  06/25/2024*   COVID-19 Vaccine (3 - Pfizer risk series) 12/14/2024*   DTaP/Tdap/Td vaccine (3 - Td or Tdap) 08/08/2027   Pneumococcal Vaccine for age over 20  Completed   Zoster (Shingles) Vaccine  Completed   Hepatitis B Vaccine  Aged Out   HPV Vaccine  Aged Out   Meningitis B Vaccine  Aged Out  *Topic was postponed. The date shown is not the original due date.   Please let me know if you do not receive your Advanced Directive Packet within 1 week. Once completed and notarized, you may return a copy to our office by either of the following.  (Copy Requested) Please bring a copy of your health care power of attorney and living will to the office to be added to your chart at your convenience. You can mail to [facility] [address] [place], [place] [address] or email to [email or web address] Advance Care Planning is important because it:  [x]  Makes sure you receive the medical care that is consistent with your values, goals, and preferences  [x]  It provides guidance to your family and loved ones and reduces their decisional burden about whether or not they are making the right decisions based on your wishes.  Follow the link provided in your after visit summary or read over the paperwork we have mailed to you to help you started getting your Advance Directives in place. If you need assistance in completing these, please reach out to us  so that we can help you!  See attachments for Preventive Care and Fall Prevention Tips.

## 2023-12-25 ENCOUNTER — Ambulatory Visit: Payer: Self-pay | Admitting: Family Medicine

## 2023-12-25 ENCOUNTER — Ambulatory Visit (INDEPENDENT_AMBULATORY_CARE_PROVIDER_SITE_OTHER): Payer: Medicare HMO | Admitting: Family Medicine

## 2023-12-25 ENCOUNTER — Encounter: Payer: Self-pay | Admitting: Family Medicine

## 2023-12-25 VITALS — BP 130/68 | HR 58 | Temp 98.1°F | Resp 16 | Ht 64.0 in | Wt 134.6 lb

## 2023-12-25 DIAGNOSIS — E785 Hyperlipidemia, unspecified: Secondary | ICD-10-CM

## 2023-12-25 DIAGNOSIS — E039 Hypothyroidism, unspecified: Secondary | ICD-10-CM

## 2023-12-25 DIAGNOSIS — R8290 Unspecified abnormal findings in urine: Secondary | ICD-10-CM

## 2023-12-25 DIAGNOSIS — R350 Frequency of micturition: Secondary | ICD-10-CM | POA: Diagnosis not present

## 2023-12-25 DIAGNOSIS — J441 Chronic obstructive pulmonary disease with (acute) exacerbation: Secondary | ICD-10-CM

## 2023-12-25 DIAGNOSIS — F0280 Dementia in other diseases classified elsewhere without behavioral disturbance: Secondary | ICD-10-CM

## 2023-12-25 DIAGNOSIS — J4489 Other specified chronic obstructive pulmonary disease: Secondary | ICD-10-CM | POA: Diagnosis not present

## 2023-12-25 DIAGNOSIS — I10 Essential (primary) hypertension: Secondary | ICD-10-CM

## 2023-12-25 DIAGNOSIS — G309 Alzheimer's disease, unspecified: Secondary | ICD-10-CM

## 2023-12-25 DIAGNOSIS — I700 Atherosclerosis of aorta: Secondary | ICD-10-CM

## 2023-12-25 LAB — POC URINALSYSI DIPSTICK (AUTOMATED)
Bilirubin, UA: NEGATIVE
Blood, UA: NEGATIVE
Glucose, UA: NEGATIVE
Ketones, UA: NEGATIVE
Nitrite, UA: POSITIVE
Protein, UA: NEGATIVE
Spec Grav, UA: <=1.005 — AB (ref 1.010–1.025)
Urobilinogen, UA: 0.2 U/dL
pH, UA: 6 (ref 5.0–8.0)

## 2023-12-25 MED ORDER — ALBUTEROL SULFATE HFA 108 (90 BASE) MCG/ACT IN AERS
1.0000 | INHALATION_SPRAY | Freq: Four times a day (QID) | RESPIRATORY_TRACT | 0 refills | Status: DC | PRN
Start: 2023-12-25 — End: 2024-02-06

## 2023-12-25 NOTE — Assessment & Plan Note (Signed)
 Well controlled, no changes to meds. Encouraged heart healthy diet such as the DASH diet and exercise as tolerated.

## 2023-12-25 NOTE — Patient Instructions (Signed)

## 2023-12-25 NOTE — Assessment & Plan Note (Signed)
 On statin.

## 2023-12-25 NOTE — Assessment & Plan Note (Signed)
 Stable Check labs

## 2023-12-25 NOTE — Progress Notes (Signed)
 Subjective:    Patient ID: Carrie Hall, female    DOB: 1934-03-27, 89 y.o.   MRN: [id number]  Chief Complaint  Patient presents with  . Nocturia    Pt states having to go to the bathroom a couple times during the night.    HPI Patient is in today for f/u.  Discussed the use of AI scribe software for clinical note transcription with the patient, who gave verbal consent to proceed.  History of Present Illness Carrie Hall is an 89 year old female who presents for medication management and follow-up.  She uses an albuterol  inhaler as needed for breathing issues and does not require the second inhaler listed in her records, as her breathing is manageable.  Her medication regimen includes a thyroid  pill, a bone pill, folic acid, and methotrexate . She takes six methotrexate  pills on Wednesdays and folic acid daily. She manages her medication schedule by writing it down. She also uses moisturizing cream for skin issues, particularly on her elbows.  She has macular degeneration in her left eye and is receiving injections for this condition. She intends to call and make an appointment with her eye doctor to get new glasses to help her see better.  She has been attending appointments with her hearing aid doctor and has multiple other appointments scheduled, which she finds overwhelming. She does not use a computer or MyChart for managing her health information.    Past Medical History:  Diagnosis Date  . Adenomatous colon polyp   . Breast cancer (HCC)   . COPD (chronic obstructive pulmonary disease) (HCC)   . Diverticulosis   . Hyperlipidemia   . Internal hemorrhoids   . Macular degeneration 2025   left eye, followed by Selinda Slocumb.  . Osteopenia   . Thyroid  disease     Past Surgical History:  Procedure Laterality Date  . BREAST LUMPECTOMY Left    radiation only  . BREAST SURGERY    . colon polyps      Family History  Problem Relation Age of Onset  . Alzheimer's  disease Mother   . Cancer Father        lung  . Alzheimer's disease Maternal Grandmother   . Rectal cancer Sister        in her 81s    Social History   Socioeconomic History  . Marital status: Widowed    Spouse name: Not on file  . Number of children: 2  . Years of education: Not on file  . Highest education level: Not on file  Occupational History  . Occupation: retired - homemaker  Tobacco Use  . Smoking status: Former    Current packs/day: 0.00    Average packs/day: 0.5 packs/day for 48.6 years (24.3 ttl pk-yrs)    Types: Cigarettes    Start date: 54    Quit date: 12/06/2003    Years since quitting: 20.0  . Smokeless tobacco: Never  Vaping Use  . Vaping status: Never Used  Substance and Sexual Activity  . Alcohol use: No  . Drug use: No  . Sexual activity: Never    Partners: Male  Other Topics Concern  . 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 Difficulty of Paying Living Expenses: Not hard at all  Food Insecurity: No Food Insecurity (12/15/2023)   Hunger Vital Sign   . Worried About Programme researcher, broadcasting/film/video in the Last Year: Never true   . Ran Out of Food in the Last Year: Never true  Transportation Needs: No Transportation Needs (12/15/2023)   PRAPARE - Transportation   . Lack of Transportation (Medical): No   . Lack of Transportation (Non-Medical): No  Physical Activity: Insufficiently Active (12/15/2023)   Exercise Vital Sign   . Days of Exercise per Week: 5 days   . Minutes of Exercise per Session: 10 min  Stress: No Stress Concern Present (12/15/2023)   [employer] of Occupational Health - Occupational Stress Questionnaire   . Feeling of Stress: Not at all  Social Connections: Moderately  Isolated (12/15/2023)   Social Connection and Isolation Panel   . Frequency of Communication with Friends and Family: Twice a week   . Frequency of Social Gatherings with Friends and Family: More than three times a week   . Attends Religious Services: More than 4 times per year   . Active Member of Clubs or Organizations: No   . Attends Banker Meetings: Never   . Marital Status: Widowed  Intimate Partner Violence: Not At Risk (12/15/2023)   Humiliation, Afraid, Rape, and Kick questionnaire   . Fear of Current or Ex-Partner: No   . Emotionally Abused: No   . Physically Abused: No   . Sexually Abused: No    Outpatient Medications Prior to Visit  Medication Sig Dispense Refill  . acetaminophen (TYLENOL) 325 MG tablet Take 650 mg by mouth every 8 (eight) hours as needed for moderate pain.    . alendronate  (FOSAMAX ) 70 MG tablet Take 1 tablet (70 mg total) by mouth once a week. Take with a full glass of water on an empty stomach. 12 tablet 3  . ALPRAZolam  (XANAX ) 0.25 MG tablet TAKE 1 TABLET BY MOUTH AT BEDTIME AS NEEDED FOR ANXIETY 30 tablet 0  . atorvastatin  (LIPITOR) 20 MG tablet Take 1 tablet (20 mg total) by mouth daily. 30 tablet 10  . budesonide -formoterol  (BREYNA ) 80-4.5 MCG/ACT inhaler Inhale 2 puffs into the lungs 2 (two) times daily. 10.2 g 5  . folic acid (FOLVITE) 1 MG tablet Take 1 mg by mouth daily.    . levothyroxine  (SYNTHROID ) 88 MCG tablet Take 1 tablet (88 mcg total) by mouth daily before breakfast. 90 tablet 1  . methotrexate  (RHEUMATREX) 2.5 MG tablet Take 12.5 mg by mouth once a week. On Wednesdays    . Multiple Vitamins-Minerals (PRESERVISION AREDS 2 PO) Take 1 tablet by mouth 2 (two) times daily.    . albuterol  (VENTOLIN  HFA) 108 (90 Base) MCG/ACT inhaler Inhale 1-2 puffs into the lungs every 6 (six) hours as needed for wheezing or shortness of breath. 18 g 0   No facility-administered medications prior to visit.    Allergies  Allergen Reactions  .  Pneumococcal Vaccines Itching and Swelling  . Donepezil  Hcl Diarrhea    Review of Systems  Constitutional:  Negative for chills, fever and malaise/fatigue.  HENT:  Negative for congestion and hearing loss.   Eyes:  Negative for blurred vision and discharge.  Respiratory:  Negative for cough, sputum production and shortness of breath.   Cardiovascular:  Negative for chest pain, palpitations and leg swelling.  Gastrointestinal:  Negative for abdominal pain, blood  in stool, constipation, diarrhea, heartburn, nausea and vomiting.  Genitourinary:  Negative for dysuria, frequency, hematuria and urgency.  Musculoskeletal:  Negative for back pain, falls and myalgias.  Skin:  Negative for rash.  Neurological:  Negative for dizziness, sensory change, loss of consciousness, weakness and headaches.  Endo/Heme/Allergies:  Negative for environmental allergies. Does not bruise/bleed easily.  Psychiatric/Behavioral:  Negative for depression and suicidal ideas. The patient is not nervous/anxious and does not have insomnia.        Objective:    Physical Exam Vitals and nursing note reviewed.  Constitutional:      General: She is not in acute distress.    Appearance: Normal appearance. She is well-developed.  HENT:     Head: Normocephalic and atraumatic.  Eyes:     General: No scleral icterus.       Right eye: No discharge.        Left eye: No discharge.  Cardiovascular:     Rate and Rhythm: Normal rate and regular rhythm.     Heart sounds: No murmur heard. Pulmonary:     Effort: Pulmonary effort is normal. No respiratory distress.     Breath sounds: Normal breath sounds.  Musculoskeletal:        General: Normal range of motion.     Cervical back: Normal range of motion and neck supple.     Right lower leg: No edema.     Left lower leg: No edema.  Skin:    General: Skin is warm and dry.  Neurological:     Mental Status: She is alert and oriented to person, place, and time.  Psychiatric:         Mood and Affect: Mood normal.        Behavior: Behavior normal.        Thought Content: Thought content normal.        Judgment: Judgment normal.     BP 130/68 (BP Location: Left Arm, Patient Position: Sitting, Cuff Size: Normal)   Pulse (!) 58   Temp 98.1 F (36.7 C) (Oral)   Resp 16   Ht 5' 4 (1.626 m)   Wt 134 lb 9.6 oz (61.1 kg)   SpO2 97%   BMI 23.10 kg/m  Wt Readings from Last 3 Encounters:  12/25/23 134 lb 9.6 oz (61.1 kg)  12/15/23 143 lb (64.9 kg)  08/04/23 143 lb 8.3 oz (65.1 kg)    Diabetic Foot Exam - Simple   No data filed    Lab Results  Component Value Date   WBC 7.7 08/04/2023   HGB 13.3 08/04/2023   HCT 39.0 08/04/2023   PLT 206 08/04/2023   GLUCOSE 100 (H) 08/04/2023   CHOL 171 06/26/2023   TRIG 80.0 06/26/2023   HDL 63.00 06/26/2023   LDLDIRECT 103.3 03/07/2014   LDLCALC 92 06/26/2023   ALT 9 06/26/2023   AST 13 06/26/2023   NA 135 08/04/2023   K 4.1 08/04/2023   CL 97 (L) 08/04/2023   CREATININE 0.80 08/04/2023   BUN 22 08/04/2023   CO2 32 06/26/2023   TSH 0.79 06/26/2023   HGBA1C 6.1 07/20/2012    Lab Results  Component Value Date   TSH 0.79 06/26/2023   Lab Results  Component Value Date   WBC 7.7 08/04/2023   HGB 13.3 08/04/2023   HCT 39.0 08/04/2023   MCV 90.1 08/04/2023   PLT 206 08/04/2023   Lab Results  Component Value Date   NA 135 08/04/2023   K  4.1 08/04/2023   CO2 32 06/26/2023   GLUCOSE 100 (H) 08/04/2023   BUN 22 08/04/2023   CREATININE 0.80 08/04/2023   BILITOT 0.6 06/26/2023   ALKPHOS 66 06/26/2023   AST 13 06/26/2023   ALT 9 06/26/2023   PROT 6.6 06/26/2023   ALBUMIN 4.2 06/26/2023   CALCIUM  9.3 06/26/2023   ANIONGAP 9 03/08/2021   GFR 76.41 06/26/2023   Lab Results  Component Value Date   CHOL 171 06/26/2023   Lab Results  Component Value Date   HDL 63.00 06/26/2023   Lab Results  Component Value Date   LDLCALC 92 06/26/2023   Lab Results  Component Value Date   TRIG 80.0  06/26/2023   Lab Results  Component Value Date   CHOLHDL 3 06/26/2023   Lab Results  Component Value Date   HGBA1C 6.1 07/20/2012       Assessment & Plan:  Urinary frequency -     POCT Urinalysis Dipstick (Automated)  COPD with chronic bronchitis (HCC) Assessment & Plan: Stable Using albuterol  rarely  Orders: -     Albuterol  Sulfate HFA; Inhale 1-2 puffs into the lungs every 6 (six) hours as needed for wheezing or shortness of breath.  Dispense: 18 g; Refill: 0  Hypothyroidism, unspecified type Assessment & Plan: Stable Check labs   Orders: -     TSH; Future  Hyperlipidemia LDL goal <100 Assessment & Plan: Tolerating statin, encouraged heart healthy diet, avoid trans fats, minimize simple carbs and saturated fats. Increase exercise as tolerated   Orders: -     Comprehensive metabolic panel with GFR; Future -     Lipid panel; Future  Essential hypertension Assessment & Plan: Well controlled, no changes to meds. Encouraged heart healthy diet such as the DASH diet and exercise as tolerated.    Orders: -     CBC with Differential/Platelet; Future -     Comprehensive metabolic panel with GFR; Future -     Lipid panel; Future -     TSH; Future  Alzheimer's dementia without behavioral disturbance (HCC)  Aortic atherosclerosis (HCC) Assessment & Plan: On statin  Orders: -     CBC with Differential/Platelet; Future -     Comprehensive metabolic panel with GFR; Future -     Lipid panel; Future  COPD exacerbation (HCC)  Assessment and Plan Assessment & Plan Asthma   She uses an albuterol  inhaler as needed and does not have the other inhaler listed in her medication list. Continue albuterol  inhaler as needed.  Exudative age-related macular degeneration, left eye   Receiving injections.  Unspecified hearing loss   Visited a hearing aid specialist, but no further details provided.  Refractive error   Plans to obtain glasses to improve  vision.  Hypothyroidism   Taking thyroid  medication.  Osteoporosis   Takes bone medication one hour after thyroid  medication.  Psoriasis vulgaris   Psoriasis on elbows managed with methotrexate  and topical creams.    Genessa Beman R Lowne Chase, DO

## 2023-12-25 NOTE — Assessment & Plan Note (Signed)
 Stable Using albuterol  rarely

## 2023-12-25 NOTE — Assessment & Plan Note (Signed)
 Tolerating statin, encouraged heart healthy diet, avoid trans fats, minimize simple carbs and saturated fats. Increase exercise as tolerated

## 2023-12-26 ENCOUNTER — Other Ambulatory Visit: Payer: Self-pay

## 2023-12-26 MED ORDER — CEPHALEXIN 500 MG PO CAPS: 500.0000 mg | ORAL_CAPSULE | Freq: Two times a day (BID) | ORAL | 0 refills | Status: DC

## 2023-12-27 LAB — URINE CULTURE
MICRO NUMBER:: 16845717
SPECIMEN QUALITY:: ADEQUATE

## 2024-02-06 ENCOUNTER — Other Ambulatory Visit: Payer: Self-pay | Admitting: Family Medicine

## 2024-02-06 DIAGNOSIS — J4489 Other specified chronic obstructive pulmonary disease: Secondary | ICD-10-CM

## 2024-02-12 DIAGNOSIS — L408 Other psoriasis: Secondary | ICD-10-CM | POA: Diagnosis not present

## 2024-02-15 DIAGNOSIS — L400 Psoriasis vulgaris: Secondary | ICD-10-CM | POA: Diagnosis not present

## 2024-02-15 DIAGNOSIS — Z79899 Other long term (current) drug therapy: Secondary | ICD-10-CM | POA: Diagnosis not present

## 2024-02-26 ENCOUNTER — Other Ambulatory Visit: Payer: Self-pay | Admitting: Family Medicine

## 2024-02-26 DIAGNOSIS — E039 Hypothyroidism, unspecified: Secondary | ICD-10-CM

## 2024-02-26 DIAGNOSIS — M8580 Other specified disorders of bone density and structure, unspecified site: Secondary | ICD-10-CM

## 2024-02-29 ENCOUNTER — Telehealth: Payer: Self-pay

## 2024-02-29 NOTE — Telephone Encounter (Signed)
 Copied from CRM [id number]. Topic: Clinical - Prescription Issue >> Feb 29, 2024  9:19 AM Laymon HERO wrote: Reason for CRM: levothyroxine  (SYNTHROID ) 88 MCG tablet is on back order- needing to switch manufacturer- please call pharmacy [facility] [phone] >> Feb 29, 2024  9:22 AM Thersia BROCKS wrote: Patient called in regarding the medication, relay that pharmacy called and will have to send in alternative since medication is on backorder is waiting on Dr.lowne chase decision

## 2024-02-29 NOTE — Telephone Encounter (Signed)
 Pharmacy called and advised that it ok to change manufacturer per Dr. Domenica.

## 2024-02-29 NOTE — Telephone Encounter (Signed)
 Pt calling back needing Rx call in. Pt states she is out of meds and Antonio is out of the office until Monday.

## 2024-03-12 DIAGNOSIS — Z961 Presence of intraocular lens: Secondary | ICD-10-CM | POA: Diagnosis not present

## 2024-03-12 DIAGNOSIS — H353123 Nonexudative age-related macular degeneration, left eye, advanced atrophic without subfoveal involvement: Secondary | ICD-10-CM | POA: Diagnosis not present

## 2024-03-12 DIAGNOSIS — H353114 Nonexudative age-related macular degeneration, right eye, advanced atrophic with subfoveal involvement: Secondary | ICD-10-CM | POA: Diagnosis not present

## 2024-03-12 DIAGNOSIS — H43813 Vitreous degeneration, bilateral: Secondary | ICD-10-CM | POA: Diagnosis not present

## 2024-03-12 DIAGNOSIS — H353221 Exudative age-related macular degeneration, left eye, with active choroidal neovascularization: Secondary | ICD-10-CM | POA: Diagnosis not present

## 2024-04-11 DIAGNOSIS — H353221 Exudative age-related macular degeneration, left eye, with active choroidal neovascularization: Secondary | ICD-10-CM | POA: Diagnosis not present

## 2024-04-29 ENCOUNTER — Ambulatory Visit: Payer: Self-pay

## 2024-04-29 ENCOUNTER — Encounter: Payer: Self-pay | Admitting: Family Medicine

## 2024-04-29 ENCOUNTER — Ambulatory Visit (INDEPENDENT_AMBULATORY_CARE_PROVIDER_SITE_OTHER): Admitting: Family Medicine

## 2024-04-29 VITALS — BP 134/78 | HR 68 | Temp 98.0°F | Resp 16 | Ht 64.0 in | Wt 141.8 lb

## 2024-04-29 DIAGNOSIS — N3000 Acute cystitis without hematuria: Secondary | ICD-10-CM

## 2024-04-29 DIAGNOSIS — R350 Frequency of micturition: Secondary | ICD-10-CM

## 2024-04-29 LAB — POC URINALSYSI DIPSTICK (AUTOMATED)
Bilirubin, UA: NEGATIVE
Blood, UA: NEGATIVE
Glucose, UA: NEGATIVE
Ketones, UA: NEGATIVE
Nitrite, UA: NEGATIVE
Protein, UA: NEGATIVE
Spec Grav, UA: <=1.005 — AB
Urobilinogen, UA: 0.2 U/dL
pH, UA: 6.5

## 2024-04-29 MED ORDER — CEPHALEXIN 500 MG PO CAPS: 500.0000 mg | ORAL_CAPSULE | Freq: Two times a day (BID) | ORAL | 0 refills | Status: DC

## 2024-04-29 NOTE — Telephone Encounter (Signed)
 Appt scheduled

## 2024-04-29 NOTE — Progress Notes (Signed)
 Chief Complaint  Patient presents with   Urinary Frequency    Urine Frequency     Carrie Hall is a 89 y.o. female here for possible UTI.  Duration: 3 days. Symptoms: Dysuria, urinary frequency, urinary hesitancy, and urgency Denies: hematuria, urinary retention, fever, nausea, vomiting, flank pain, vaginal discharge Hx of recurrent UTI? No Denies new sexual partners.  Past Medical History:  Diagnosis Date   Adenomatous colon polyp    Breast cancer (HCC)    COPD (chronic obstructive pulmonary disease) (HCC)    Diverticulosis    Hyperlipidemia    Internal hemorrhoids    Macular degeneration 2025   left eye, followed by Selinda Slocumb.   Osteopenia    Thyroid  disease      BP 134/78 (BP Location: Left Arm, Patient Position: Sitting)   Pulse 68   Temp 98 F (36.7 C) (Oral)   Resp 16   Ht 5' 4 (1.626 m)   Wt 141 lb 12.8 oz (64.3 kg)   SpO2 98%   BMI 24.34 kg/m  General: Awake, alert, appears stated age Heart: RRR Lungs: CTAB, normal respiratory effort, no accessory muscle usage Abd: BS+, soft, NT, ND, no masses or organomegaly MSK: No CVA tenderness, neg Lloyd's sign Psych: Age appropriate judgment and insight  Acute cystitis without hematuria  7 d of Keflex . Cx. UA w trace LE's. S/s's similar to previous UTI's. Stay hydrated. Seek immediate care if pt starts to develop fevers, new/worsening symptoms, uncontrollable N/V. F/u prn. The patient voiced understanding and agreement to the plan.  Mabel [place], [place] 04/29/2024 2:38 PM

## 2024-04-29 NOTE — Patient Instructions (Signed)
 Stay hydrated.   Warning signs/symptoms: Uncontrollable nausea/vomiting, fevers, worsening symptoms despite treatment, confusion.  Give [place] around 2 business days to get culture back to you.  Let us know if you need anything.

## 2024-04-29 NOTE — Addendum Note (Signed)
 Addended by: Nuno Brubacher M on: 04/29/2024 02:55 PM   Modules accepted: Orders

## 2024-04-29 NOTE — Telephone Encounter (Signed)
 FYI Only or Action Required?: FYI only for provider: appointment scheduled on 04/30/24.  Patient was last seen in primary care on 12/25/2023 by Antonio Meth, Jamee SAUNDERS, DO.  Called Nurse Triage reporting Urinary Frequency.  Symptoms began several days ago.  Interventions attempted: Nothing.  Symptoms are: unchanged.  Triage Disposition: See Physician Within 24 Hours  Patient/caregiver understands and will follow disposition?: Yes  Message from Kevelyn M sent at 04/29/2024  8:55 AM EST  Reason for Triage: Possibly UTI symptoms- pressure when she has to urinates for the last couple of days.  Call back # [phone]   Reason for Disposition  Urinating more frequently than usual (i.e., frequency) OR new-onset of the feeling of an urgent need to urinate (i.e., urgency)  Answer Assessment - Initial Assessment Questions Patient says she get infections after diarrhea, wear depends. She had diarrhea on Tuesday and started having frequency on Friday. She says she felt bad all weekend, no pain, no fever. Scheduled today with Dr. Frann, she agrees.   1. SYMPTOM: What's the main symptom you're concerned about? (e.g., frequency, incontinence)     Frequency  2. ONSET: When did the symptoms start?     Friday  3. PAIN: Is there any pain? If Yes, ask: How bad is it? (Scale: 1-10; mild, moderate, severe)     No  4. CAUSE: What do you think is causing the symptoms?     UTI  5. OTHER SYMPTOMS: Do you have any other symptoms? (e.g., blood in urine, fever, flank pain, pain with urination)     No  Protocols used: Urinary Symptoms-A-AH

## 2024-05-03 ENCOUNTER — Ambulatory Visit: Payer: Self-pay | Admitting: Family Medicine

## 2024-05-03 LAB — URINE CULTURE
MICRO NUMBER:: 17386142
SPECIMEN QUALITY:: ADEQUATE

## 2024-05-03 MED ORDER — NITROFURANTOIN MONOHYD MACRO 100 MG PO CAPS: 100.0000 mg | ORAL_CAPSULE | Freq: Two times a day (BID) | ORAL | 0 refills | Status: AC

## 2024-05-16 ENCOUNTER — Telehealth: Payer: Self-pay

## 2024-05-16 NOTE — Telephone Encounter (Signed)
 Copied from CRM #[id number]. Topic: Clinical - Medical Advice >> May 16, 2024 10:49 AM Rea BROCKS wrote: Reason for CRM: Patient called and stated that she finished her first round of antibiotics and she feels better. When her son went to pick up her meds over the weekend, she said it was more antibiotics. She is wondering if there was another problem that she needs to take them for or not?   [phone] (M)

## 2024-05-17 NOTE — Telephone Encounter (Signed)
Spoke w/ Pt- informed of PCP recommendations.  

## 2024-07-02 ENCOUNTER — Encounter: Admitting: Family Medicine

## 2024-12-18 ENCOUNTER — Ambulatory Visit
